# Patient Record
Sex: Female | Born: 1935
Health system: Southern US, Community
[De-identification: ages and names within clinical notes are randomized; demographics above are authoritative.]

## PROBLEM LIST (undated history)

## (undated) DIAGNOSIS — F32A Depression, unspecified: Secondary | ICD-10-CM

## (undated) DIAGNOSIS — R1310 Dysphagia, unspecified: Secondary | ICD-10-CM

## (undated) DIAGNOSIS — F329 Major depressive disorder, single episode, unspecified: Secondary | ICD-10-CM

## (undated) DIAGNOSIS — M419 Scoliosis, unspecified: Secondary | ICD-10-CM

## (undated) DIAGNOSIS — F0390 Unspecified dementia without behavioral disturbance: Secondary | ICD-10-CM

## (undated) DIAGNOSIS — I10 Essential (primary) hypertension: Secondary | ICD-10-CM

## (undated) DIAGNOSIS — J449 Chronic obstructive pulmonary disease, unspecified: Secondary | ICD-10-CM

## (undated) HISTORY — PX: ABDOMINAL HYSTERECTOMY: SHX81

## (undated) HISTORY — PX: VITRECTOMY AND CATARACT: SHX6184

## (undated) HISTORY — PX: SHOULDER SURGERY: SHX246

## (undated) HISTORY — PX: KNEE SURGERY: SHX244

---

## 2014-02-10 DEATH — deceased

## 2015-08-16 DIAGNOSIS — F339 Major depressive disorder, recurrent, unspecified: Secondary | ICD-10-CM | POA: Diagnosis not present

## 2015-08-19 DIAGNOSIS — F339 Major depressive disorder, recurrent, unspecified: Secondary | ICD-10-CM | POA: Diagnosis not present

## 2015-08-23 DIAGNOSIS — F339 Major depressive disorder, recurrent, unspecified: Secondary | ICD-10-CM | POA: Diagnosis not present

## 2015-08-27 DIAGNOSIS — F339 Major depressive disorder, recurrent, unspecified: Secondary | ICD-10-CM | POA: Diagnosis not present

## 2015-08-30 DIAGNOSIS — F0281 Dementia in other diseases classified elsewhere with behavioral disturbance: Secondary | ICD-10-CM | POA: Diagnosis not present

## 2015-08-30 DIAGNOSIS — N3090 Cystitis, unspecified without hematuria: Secondary | ICD-10-CM | POA: Diagnosis not present

## 2015-08-30 DIAGNOSIS — Z139 Encounter for screening, unspecified: Secondary | ICD-10-CM | POA: Diagnosis not present

## 2015-08-30 DIAGNOSIS — G301 Alzheimer's disease with late onset: Secondary | ICD-10-CM | POA: Diagnosis not present

## 2015-08-30 DIAGNOSIS — M150 Primary generalized (osteo)arthritis: Secondary | ICD-10-CM | POA: Diagnosis not present

## 2015-08-30 DIAGNOSIS — M810 Age-related osteoporosis without current pathological fracture: Secondary | ICD-10-CM | POA: Diagnosis not present

## 2015-08-30 DIAGNOSIS — I10 Essential (primary) hypertension: Secondary | ICD-10-CM | POA: Diagnosis not present

## 2015-08-31 DIAGNOSIS — F339 Major depressive disorder, recurrent, unspecified: Secondary | ICD-10-CM | POA: Diagnosis not present

## 2015-09-02 DIAGNOSIS — F339 Major depressive disorder, recurrent, unspecified: Secondary | ICD-10-CM | POA: Diagnosis not present

## 2015-09-07 DIAGNOSIS — F339 Major depressive disorder, recurrent, unspecified: Secondary | ICD-10-CM | POA: Diagnosis not present

## 2015-09-09 DIAGNOSIS — F339 Major depressive disorder, recurrent, unspecified: Secondary | ICD-10-CM | POA: Diagnosis not present

## 2015-09-14 DIAGNOSIS — F339 Major depressive disorder, recurrent, unspecified: Secondary | ICD-10-CM | POA: Diagnosis not present

## 2015-09-16 DIAGNOSIS — F339 Major depressive disorder, recurrent, unspecified: Secondary | ICD-10-CM | POA: Diagnosis not present

## 2015-09-23 DIAGNOSIS — F339 Major depressive disorder, recurrent, unspecified: Secondary | ICD-10-CM | POA: Diagnosis not present

## 2015-09-25 DIAGNOSIS — F339 Major depressive disorder, recurrent, unspecified: Secondary | ICD-10-CM | POA: Diagnosis not present

## 2015-09-27 DIAGNOSIS — F339 Major depressive disorder, recurrent, unspecified: Secondary | ICD-10-CM | POA: Diagnosis not present

## 2015-10-02 DIAGNOSIS — F339 Major depressive disorder, recurrent, unspecified: Secondary | ICD-10-CM | POA: Diagnosis not present

## 2015-10-05 DIAGNOSIS — F339 Major depressive disorder, recurrent, unspecified: Secondary | ICD-10-CM | POA: Diagnosis not present

## 2015-10-07 DIAGNOSIS — F339 Major depressive disorder, recurrent, unspecified: Secondary | ICD-10-CM | POA: Diagnosis not present

## 2015-10-14 DIAGNOSIS — F339 Major depressive disorder, recurrent, unspecified: Secondary | ICD-10-CM | POA: Diagnosis not present

## 2015-10-18 LAB — HEPATIC FUNCTION PANEL
ALT: 10 U/L (ref 7–35)
AST: 21 U/L (ref 13–35)
Alkaline Phosphatase: 67 U/L (ref 25–125)
Bilirubin, Total: 0.3 mg/dL

## 2015-10-18 LAB — BASIC METABOLIC PANEL
BUN: 22 mg/dL — AB (ref 4–21)
Creatinine: 1 mg/dL (ref 0.5–1.1)
Glucose: 126 mg/dL
Potassium: 4 mmol/L (ref 3.4–5.3)
Sodium: 145 mmol/L (ref 137–147)

## 2015-10-18 LAB — LIPID PANEL
Cholesterol: 193 mg/dL (ref 0–200)
HDL: 62 mg/dL (ref 35–70)
LDL Cholesterol: 116 mg/dL
Triglycerides: 75 mg/dL (ref 40–160)

## 2015-10-19 DIAGNOSIS — F339 Major depressive disorder, recurrent, unspecified: Secondary | ICD-10-CM | POA: Diagnosis not present

## 2015-10-21 DIAGNOSIS — F339 Major depressive disorder, recurrent, unspecified: Secondary | ICD-10-CM | POA: Diagnosis not present

## 2015-11-02 DIAGNOSIS — R079 Chest pain, unspecified: Secondary | ICD-10-CM | POA: Diagnosis not present

## 2015-11-02 DIAGNOSIS — R0602 Shortness of breath: Secondary | ICD-10-CM | POA: Diagnosis not present

## 2015-11-02 DIAGNOSIS — Z139 Encounter for screening, unspecified: Secondary | ICD-10-CM | POA: Diagnosis not present

## 2015-11-02 DIAGNOSIS — J449 Chronic obstructive pulmonary disease, unspecified: Secondary | ICD-10-CM | POA: Diagnosis not present

## 2015-11-02 DIAGNOSIS — F339 Major depressive disorder, recurrent, unspecified: Secondary | ICD-10-CM | POA: Diagnosis not present

## 2015-11-02 DIAGNOSIS — M1990 Unspecified osteoarthritis, unspecified site: Secondary | ICD-10-CM | POA: Diagnosis not present

## 2015-11-04 DIAGNOSIS — F339 Major depressive disorder, recurrent, unspecified: Secondary | ICD-10-CM | POA: Diagnosis not present

## 2015-11-09 DIAGNOSIS — F339 Major depressive disorder, recurrent, unspecified: Secondary | ICD-10-CM | POA: Diagnosis not present

## 2015-11-10 DIAGNOSIS — L603 Nail dystrophy: Secondary | ICD-10-CM | POA: Diagnosis not present

## 2015-11-10 DIAGNOSIS — M2041 Other hammer toe(s) (acquired), right foot: Secondary | ICD-10-CM | POA: Diagnosis not present

## 2015-11-10 DIAGNOSIS — Z139 Encounter for screening, unspecified: Secondary | ICD-10-CM | POA: Diagnosis not present

## 2015-11-10 DIAGNOSIS — M2042 Other hammer toe(s) (acquired), left foot: Secondary | ICD-10-CM | POA: Diagnosis not present

## 2015-11-10 DIAGNOSIS — M19079 Primary osteoarthritis, unspecified ankle and foot: Secondary | ICD-10-CM | POA: Diagnosis not present

## 2015-11-11 DIAGNOSIS — F339 Major depressive disorder, recurrent, unspecified: Secondary | ICD-10-CM | POA: Diagnosis not present

## 2015-11-16 DIAGNOSIS — F339 Major depressive disorder, recurrent, unspecified: Secondary | ICD-10-CM | POA: Diagnosis not present

## 2015-11-20 DIAGNOSIS — F339 Major depressive disorder, recurrent, unspecified: Secondary | ICD-10-CM | POA: Diagnosis not present

## 2015-11-23 DIAGNOSIS — F339 Major depressive disorder, recurrent, unspecified: Secondary | ICD-10-CM | POA: Diagnosis not present

## 2015-11-25 DIAGNOSIS — F0390 Unspecified dementia without behavioral disturbance: Secondary | ICD-10-CM | POA: Diagnosis not present

## 2015-11-25 DIAGNOSIS — M1990 Unspecified osteoarthritis, unspecified site: Secondary | ICD-10-CM | POA: Diagnosis not present

## 2015-11-25 DIAGNOSIS — Z96653 Presence of artificial knee joint, bilateral: Secondary | ICD-10-CM | POA: Diagnosis not present

## 2015-11-25 DIAGNOSIS — N183 Chronic kidney disease, stage 3 (moderate): Secondary | ICD-10-CM | POA: Diagnosis not present

## 2015-11-25 DIAGNOSIS — J439 Emphysema, unspecified: Secondary | ICD-10-CM | POA: Diagnosis not present

## 2015-11-25 DIAGNOSIS — F339 Major depressive disorder, recurrent, unspecified: Secondary | ICD-10-CM | POA: Diagnosis not present

## 2015-11-25 DIAGNOSIS — J984 Other disorders of lung: Secondary | ICD-10-CM | POA: Diagnosis not present

## 2015-11-25 DIAGNOSIS — I129 Hypertensive chronic kidney disease with stage 1 through stage 4 chronic kidney disease, or unspecified chronic kidney disease: Secondary | ICD-10-CM | POA: Diagnosis not present

## 2015-11-25 DIAGNOSIS — G309 Alzheimer's disease, unspecified: Secondary | ICD-10-CM | POA: Diagnosis not present

## 2015-11-25 DIAGNOSIS — R4182 Altered mental status, unspecified: Secondary | ICD-10-CM | POA: Diagnosis not present

## 2015-11-25 DIAGNOSIS — F0281 Dementia in other diseases classified elsewhere with behavioral disturbance: Secondary | ICD-10-CM | POA: Diagnosis not present

## 2015-11-26 DIAGNOSIS — R079 Chest pain, unspecified: Secondary | ICD-10-CM | POA: Diagnosis not present

## 2015-11-26 DIAGNOSIS — F0150 Vascular dementia without behavioral disturbance: Secondary | ICD-10-CM | POA: Diagnosis not present

## 2015-11-26 DIAGNOSIS — J984 Other disorders of lung: Secondary | ICD-10-CM | POA: Diagnosis not present

## 2015-11-26 DIAGNOSIS — Z96653 Presence of artificial knee joint, bilateral: Secondary | ICD-10-CM | POA: Diagnosis present

## 2015-11-26 DIAGNOSIS — R262 Difficulty in walking, not elsewhere classified: Secondary | ICD-10-CM | POA: Diagnosis not present

## 2015-11-26 DIAGNOSIS — Z96619 Presence of unspecified artificial shoulder joint: Secondary | ICD-10-CM | POA: Diagnosis present

## 2015-11-26 DIAGNOSIS — N179 Acute kidney failure, unspecified: Secondary | ICD-10-CM | POA: Diagnosis not present

## 2015-11-26 DIAGNOSIS — D649 Anemia, unspecified: Secondary | ICD-10-CM | POA: Diagnosis not present

## 2015-11-26 DIAGNOSIS — G309 Alzheimer's disease, unspecified: Secondary | ICD-10-CM | POA: Diagnosis present

## 2015-11-26 DIAGNOSIS — N183 Chronic kidney disease, stage 3 (moderate): Secondary | ICD-10-CM | POA: Diagnosis present

## 2015-11-26 DIAGNOSIS — G308 Other Alzheimer's disease: Secondary | ICD-10-CM | POA: Diagnosis not present

## 2015-11-26 DIAGNOSIS — M1990 Unspecified osteoarthritis, unspecified site: Secondary | ICD-10-CM | POA: Diagnosis not present

## 2015-11-26 DIAGNOSIS — R2689 Other abnormalities of gait and mobility: Secondary | ICD-10-CM | POA: Diagnosis present

## 2015-11-26 DIAGNOSIS — I10 Essential (primary) hypertension: Secondary | ICD-10-CM | POA: Diagnosis not present

## 2015-11-26 DIAGNOSIS — I129 Hypertensive chronic kidney disease with stage 1 through stage 4 chronic kidney disease, or unspecified chronic kidney disease: Secondary | ICD-10-CM | POA: Diagnosis present

## 2015-11-26 DIAGNOSIS — J439 Emphysema, unspecified: Secondary | ICD-10-CM | POA: Diagnosis not present

## 2015-11-26 DIAGNOSIS — R4182 Altered mental status, unspecified: Secondary | ICD-10-CM | POA: Diagnosis not present

## 2015-11-26 DIAGNOSIS — F0281 Dementia in other diseases classified elsewhere with behavioral disturbance: Secondary | ICD-10-CM | POA: Diagnosis present

## 2015-11-26 DIAGNOSIS — M6281 Muscle weakness (generalized): Secondary | ICD-10-CM | POA: Diagnosis not present

## 2015-11-26 DIAGNOSIS — F0391 Unspecified dementia with behavioral disturbance: Secondary | ICD-10-CM | POA: Diagnosis not present

## 2015-12-02 DIAGNOSIS — F339 Major depressive disorder, recurrent, unspecified: Secondary | ICD-10-CM | POA: Diagnosis not present

## 2015-12-04 DIAGNOSIS — F339 Major depressive disorder, recurrent, unspecified: Secondary | ICD-10-CM | POA: Diagnosis not present

## 2015-12-07 DIAGNOSIS — R410 Disorientation, unspecified: Secondary | ICD-10-CM | POA: Diagnosis not present

## 2015-12-07 DIAGNOSIS — G308 Other Alzheimer's disease: Secondary | ICD-10-CM | POA: Diagnosis not present

## 2015-12-07 DIAGNOSIS — I129 Hypertensive chronic kidney disease with stage 1 through stage 4 chronic kidney disease, or unspecified chronic kidney disease: Secondary | ICD-10-CM | POA: Diagnosis present

## 2015-12-07 DIAGNOSIS — R1310 Dysphagia, unspecified: Secondary | ICD-10-CM | POA: Diagnosis not present

## 2015-12-07 DIAGNOSIS — G3183 Dementia with Lewy bodies: Secondary | ICD-10-CM | POA: Diagnosis not present

## 2015-12-07 DIAGNOSIS — F0281 Dementia in other diseases classified elsewhere with behavioral disturbance: Secondary | ICD-10-CM | POA: Diagnosis present

## 2015-12-07 DIAGNOSIS — F338 Other recurrent depressive disorders: Secondary | ICD-10-CM | POA: Diagnosis not present

## 2015-12-07 DIAGNOSIS — F0280 Dementia in other diseases classified elsewhere without behavioral disturbance: Secondary | ICD-10-CM | POA: Diagnosis not present

## 2015-12-07 DIAGNOSIS — N189 Chronic kidney disease, unspecified: Secondary | ICD-10-CM | POA: Diagnosis present

## 2015-12-07 DIAGNOSIS — R4182 Altered mental status, unspecified: Secondary | ICD-10-CM | POA: Diagnosis not present

## 2015-12-07 DIAGNOSIS — F0390 Unspecified dementia without behavioral disturbance: Secondary | ICD-10-CM | POA: Diagnosis not present

## 2015-12-07 DIAGNOSIS — F919 Conduct disorder, unspecified: Secondary | ICD-10-CM | POA: Diagnosis not present

## 2015-12-07 DIAGNOSIS — W19XXXA Unspecified fall, initial encounter: Secondary | ICD-10-CM | POA: Diagnosis not present

## 2015-12-07 DIAGNOSIS — R531 Weakness: Secondary | ICD-10-CM | POA: Diagnosis not present

## 2015-12-07 DIAGNOSIS — R269 Unspecified abnormalities of gait and mobility: Secondary | ICD-10-CM | POA: Diagnosis not present

## 2015-12-07 DIAGNOSIS — M6281 Muscle weakness (generalized): Secondary | ICD-10-CM | POA: Diagnosis not present

## 2015-12-07 DIAGNOSIS — R451 Restlessness and agitation: Secondary | ICD-10-CM | POA: Diagnosis not present

## 2015-12-07 DIAGNOSIS — R443 Hallucinations, unspecified: Secondary | ICD-10-CM | POA: Diagnosis not present

## 2015-12-07 DIAGNOSIS — I10 Essential (primary) hypertension: Secondary | ICD-10-CM | POA: Diagnosis not present

## 2015-12-07 DIAGNOSIS — R293 Abnormal posture: Secondary | ICD-10-CM | POA: Diagnosis not present

## 2015-12-07 DIAGNOSIS — Z96619 Presence of unspecified artificial shoulder joint: Secondary | ICD-10-CM | POA: Diagnosis present

## 2015-12-07 DIAGNOSIS — F0391 Unspecified dementia with behavioral disturbance: Secondary | ICD-10-CM | POA: Diagnosis not present

## 2015-12-07 DIAGNOSIS — Z96653 Presence of artificial knee joint, bilateral: Secondary | ICD-10-CM | POA: Diagnosis present

## 2015-12-07 DIAGNOSIS — M1990 Unspecified osteoarthritis, unspecified site: Secondary | ICD-10-CM | POA: Diagnosis not present

## 2015-12-07 DIAGNOSIS — J984 Other disorders of lung: Secondary | ICD-10-CM | POA: Diagnosis not present

## 2015-12-07 DIAGNOSIS — N281 Cyst of kidney, acquired: Secondary | ICD-10-CM | POA: Diagnosis not present

## 2015-12-07 DIAGNOSIS — J449 Chronic obstructive pulmonary disease, unspecified: Secondary | ICD-10-CM | POA: Diagnosis not present

## 2015-12-07 DIAGNOSIS — J929 Pleural plaque without asbestos: Secondary | ICD-10-CM | POA: Diagnosis not present

## 2015-12-07 DIAGNOSIS — N179 Acute kidney failure, unspecified: Secondary | ICD-10-CM | POA: Diagnosis not present

## 2015-12-07 DIAGNOSIS — G309 Alzheimer's disease, unspecified: Secondary | ICD-10-CM | POA: Diagnosis not present

## 2015-12-10 DIAGNOSIS — R531 Weakness: Secondary | ICD-10-CM | POA: Diagnosis not present

## 2015-12-10 DIAGNOSIS — R293 Abnormal posture: Secondary | ICD-10-CM | POA: Diagnosis not present

## 2015-12-10 DIAGNOSIS — J449 Chronic obstructive pulmonary disease, unspecified: Secondary | ICD-10-CM | POA: Diagnosis not present

## 2015-12-10 DIAGNOSIS — R269 Unspecified abnormalities of gait and mobility: Secondary | ICD-10-CM | POA: Diagnosis not present

## 2015-12-10 DIAGNOSIS — R42 Dizziness and giddiness: Secondary | ICD-10-CM | POA: Diagnosis not present

## 2015-12-10 DIAGNOSIS — D539 Nutritional anemia, unspecified: Secondary | ICD-10-CM | POA: Diagnosis not present

## 2015-12-10 DIAGNOSIS — M6281 Muscle weakness (generalized): Secondary | ICD-10-CM | POA: Diagnosis not present

## 2015-12-10 DIAGNOSIS — G3183 Dementia with Lewy bodies: Secondary | ICD-10-CM | POA: Diagnosis not present

## 2015-12-10 DIAGNOSIS — G309 Alzheimer's disease, unspecified: Secondary | ICD-10-CM | POA: Diagnosis not present

## 2015-12-10 DIAGNOSIS — F0391 Unspecified dementia with behavioral disturbance: Secondary | ICD-10-CM | POA: Diagnosis not present

## 2015-12-10 DIAGNOSIS — H43393 Other vitreous opacities, bilateral: Secondary | ICD-10-CM | POA: Diagnosis not present

## 2015-12-10 DIAGNOSIS — I70203 Unspecified atherosclerosis of native arteries of extremities, bilateral legs: Secondary | ICD-10-CM | POA: Diagnosis not present

## 2015-12-10 DIAGNOSIS — H35013 Changes in retinal vascular appearance, bilateral: Secondary | ICD-10-CM | POA: Diagnosis not present

## 2015-12-10 DIAGNOSIS — Z2821 Immunization not carried out because of patient refusal: Secondary | ICD-10-CM | POA: Diagnosis not present

## 2015-12-10 DIAGNOSIS — R4182 Altered mental status, unspecified: Secondary | ICD-10-CM | POA: Diagnosis not present

## 2015-12-10 DIAGNOSIS — B351 Tinea unguium: Secondary | ICD-10-CM | POA: Diagnosis not present

## 2015-12-10 DIAGNOSIS — R1310 Dysphagia, unspecified: Secondary | ICD-10-CM | POA: Diagnosis not present

## 2015-12-10 DIAGNOSIS — F0390 Unspecified dementia without behavioral disturbance: Secondary | ICD-10-CM | POA: Diagnosis not present

## 2015-12-10 DIAGNOSIS — Z961 Presence of intraocular lens: Secondary | ICD-10-CM | POA: Diagnosis not present

## 2015-12-10 DIAGNOSIS — N3942 Incontinence without sensory awareness: Secondary | ICD-10-CM | POA: Diagnosis not present

## 2015-12-10 DIAGNOSIS — I10 Essential (primary) hypertension: Secondary | ICD-10-CM | POA: Diagnosis not present

## 2015-12-10 DIAGNOSIS — N179 Acute kidney failure, unspecified: Secondary | ICD-10-CM | POA: Diagnosis not present

## 2015-12-10 DIAGNOSIS — M79672 Pain in left foot: Secondary | ICD-10-CM | POA: Diagnosis not present

## 2015-12-10 DIAGNOSIS — F329 Major depressive disorder, single episode, unspecified: Secondary | ICD-10-CM | POA: Diagnosis not present

## 2015-12-10 DIAGNOSIS — R5381 Other malaise: Secondary | ICD-10-CM | POA: Diagnosis not present

## 2015-12-10 DIAGNOSIS — F339 Major depressive disorder, recurrent, unspecified: Secondary | ICD-10-CM | POA: Diagnosis not present

## 2015-12-10 DIAGNOSIS — M1990 Unspecified osteoarthritis, unspecified site: Secondary | ICD-10-CM | POA: Diagnosis not present

## 2015-12-10 DIAGNOSIS — R2689 Other abnormalities of gait and mobility: Secondary | ICD-10-CM | POA: Diagnosis not present

## 2015-12-10 DIAGNOSIS — F338 Other recurrent depressive disorders: Secondary | ICD-10-CM | POA: Diagnosis not present

## 2015-12-13 DIAGNOSIS — R42 Dizziness and giddiness: Secondary | ICD-10-CM | POA: Diagnosis not present

## 2015-12-13 DIAGNOSIS — I10 Essential (primary) hypertension: Secondary | ICD-10-CM | POA: Diagnosis not present

## 2015-12-13 DIAGNOSIS — R2689 Other abnormalities of gait and mobility: Secondary | ICD-10-CM | POA: Diagnosis not present

## 2015-12-13 DIAGNOSIS — J449 Chronic obstructive pulmonary disease, unspecified: Secondary | ICD-10-CM | POA: Diagnosis not present

## 2015-12-13 DIAGNOSIS — N3942 Incontinence without sensory awareness: Secondary | ICD-10-CM | POA: Diagnosis not present

## 2015-12-13 DIAGNOSIS — G309 Alzheimer's disease, unspecified: Secondary | ICD-10-CM | POA: Diagnosis not present

## 2015-12-13 DIAGNOSIS — R5381 Other malaise: Secondary | ICD-10-CM | POA: Diagnosis not present

## 2015-12-14 DIAGNOSIS — I70203 Unspecified atherosclerosis of native arteries of extremities, bilateral legs: Secondary | ICD-10-CM | POA: Diagnosis not present

## 2015-12-14 DIAGNOSIS — G309 Alzheimer's disease, unspecified: Secondary | ICD-10-CM | POA: Diagnosis not present

## 2015-12-14 DIAGNOSIS — N3942 Incontinence without sensory awareness: Secondary | ICD-10-CM | POA: Diagnosis not present

## 2015-12-14 DIAGNOSIS — B351 Tinea unguium: Secondary | ICD-10-CM | POA: Diagnosis not present

## 2015-12-14 DIAGNOSIS — I10 Essential (primary) hypertension: Secondary | ICD-10-CM | POA: Diagnosis not present

## 2015-12-14 DIAGNOSIS — R42 Dizziness and giddiness: Secondary | ICD-10-CM | POA: Diagnosis not present

## 2015-12-14 DIAGNOSIS — M79672 Pain in left foot: Secondary | ICD-10-CM | POA: Diagnosis not present

## 2015-12-14 DIAGNOSIS — D539 Nutritional anemia, unspecified: Secondary | ICD-10-CM | POA: Diagnosis not present

## 2015-12-14 DIAGNOSIS — J449 Chronic obstructive pulmonary disease, unspecified: Secondary | ICD-10-CM | POA: Diagnosis not present

## 2015-12-16 DIAGNOSIS — J449 Chronic obstructive pulmonary disease, unspecified: Secondary | ICD-10-CM | POA: Diagnosis not present

## 2015-12-16 DIAGNOSIS — F0391 Unspecified dementia with behavioral disturbance: Secondary | ICD-10-CM | POA: Diagnosis not present

## 2015-12-16 DIAGNOSIS — F329 Major depressive disorder, single episode, unspecified: Secondary | ICD-10-CM | POA: Diagnosis not present

## 2015-12-16 DIAGNOSIS — I10 Essential (primary) hypertension: Secondary | ICD-10-CM | POA: Diagnosis not present

## 2015-12-29 DIAGNOSIS — Z961 Presence of intraocular lens: Secondary | ICD-10-CM | POA: Diagnosis not present

## 2015-12-29 DIAGNOSIS — H43393 Other vitreous opacities, bilateral: Secondary | ICD-10-CM | POA: Diagnosis not present

## 2015-12-29 DIAGNOSIS — H35013 Changes in retinal vascular appearance, bilateral: Secondary | ICD-10-CM | POA: Diagnosis not present

## 2016-01-02 DIAGNOSIS — F0390 Unspecified dementia without behavioral disturbance: Secondary | ICD-10-CM | POA: Diagnosis not present

## 2016-01-02 DIAGNOSIS — F329 Major depressive disorder, single episode, unspecified: Secondary | ICD-10-CM | POA: Diagnosis not present

## 2016-01-02 DIAGNOSIS — J449 Chronic obstructive pulmonary disease, unspecified: Secondary | ICD-10-CM | POA: Diagnosis not present

## 2016-01-02 DIAGNOSIS — I10 Essential (primary) hypertension: Secondary | ICD-10-CM | POA: Diagnosis not present

## 2016-01-05 DIAGNOSIS — J449 Chronic obstructive pulmonary disease, unspecified: Secondary | ICD-10-CM | POA: Diagnosis not present

## 2016-01-05 DIAGNOSIS — I10 Essential (primary) hypertension: Secondary | ICD-10-CM | POA: Diagnosis not present

## 2016-01-05 DIAGNOSIS — F0390 Unspecified dementia without behavioral disturbance: Secondary | ICD-10-CM | POA: Diagnosis not present

## 2016-01-05 DIAGNOSIS — F329 Major depressive disorder, single episode, unspecified: Secondary | ICD-10-CM | POA: Diagnosis not present

## 2016-01-07 DIAGNOSIS — F0281 Dementia in other diseases classified elsewhere with behavioral disturbance: Secondary | ICD-10-CM | POA: Diagnosis not present

## 2016-01-07 DIAGNOSIS — I10 Essential (primary) hypertension: Secondary | ICD-10-CM | POA: Diagnosis not present

## 2016-01-07 DIAGNOSIS — Z9183 Wandering in diseases classified elsewhere: Secondary | ICD-10-CM | POA: Diagnosis not present

## 2016-01-07 DIAGNOSIS — R2689 Other abnormalities of gait and mobility: Secondary | ICD-10-CM | POA: Diagnosis not present

## 2016-01-07 DIAGNOSIS — F338 Other recurrent depressive disorders: Secondary | ICD-10-CM | POA: Diagnosis not present

## 2016-01-07 DIAGNOSIS — G309 Alzheimer's disease, unspecified: Secondary | ICD-10-CM | POA: Diagnosis not present

## 2016-01-07 DIAGNOSIS — J449 Chronic obstructive pulmonary disease, unspecified: Secondary | ICD-10-CM | POA: Diagnosis not present

## 2016-01-11 DIAGNOSIS — F338 Other recurrent depressive disorders: Secondary | ICD-10-CM | POA: Diagnosis not present

## 2016-01-11 DIAGNOSIS — I10 Essential (primary) hypertension: Secondary | ICD-10-CM | POA: Diagnosis not present

## 2016-01-11 DIAGNOSIS — F0281 Dementia in other diseases classified elsewhere with behavioral disturbance: Secondary | ICD-10-CM | POA: Diagnosis not present

## 2016-01-11 DIAGNOSIS — G309 Alzheimer's disease, unspecified: Secondary | ICD-10-CM | POA: Diagnosis not present

## 2016-01-11 DIAGNOSIS — R2689 Other abnormalities of gait and mobility: Secondary | ICD-10-CM | POA: Diagnosis not present

## 2016-01-11 DIAGNOSIS — J449 Chronic obstructive pulmonary disease, unspecified: Secondary | ICD-10-CM | POA: Diagnosis not present

## 2016-01-12 DIAGNOSIS — G309 Alzheimer's disease, unspecified: Secondary | ICD-10-CM | POA: Diagnosis not present

## 2016-01-12 DIAGNOSIS — F338 Other recurrent depressive disorders: Secondary | ICD-10-CM | POA: Diagnosis not present

## 2016-01-12 DIAGNOSIS — J449 Chronic obstructive pulmonary disease, unspecified: Secondary | ICD-10-CM | POA: Diagnosis not present

## 2016-01-12 DIAGNOSIS — R2689 Other abnormalities of gait and mobility: Secondary | ICD-10-CM | POA: Diagnosis not present

## 2016-01-12 DIAGNOSIS — F0281 Dementia in other diseases classified elsewhere with behavioral disturbance: Secondary | ICD-10-CM | POA: Diagnosis not present

## 2016-01-12 DIAGNOSIS — I10 Essential (primary) hypertension: Secondary | ICD-10-CM | POA: Diagnosis not present

## 2016-01-13 DIAGNOSIS — F338 Other recurrent depressive disorders: Secondary | ICD-10-CM | POA: Diagnosis not present

## 2016-01-13 DIAGNOSIS — F0281 Dementia in other diseases classified elsewhere with behavioral disturbance: Secondary | ICD-10-CM | POA: Diagnosis not present

## 2016-01-13 DIAGNOSIS — G309 Alzheimer's disease, unspecified: Secondary | ICD-10-CM | POA: Diagnosis not present

## 2016-01-13 DIAGNOSIS — J449 Chronic obstructive pulmonary disease, unspecified: Secondary | ICD-10-CM | POA: Diagnosis not present

## 2016-01-13 DIAGNOSIS — I10 Essential (primary) hypertension: Secondary | ICD-10-CM | POA: Diagnosis not present

## 2016-01-13 DIAGNOSIS — R2689 Other abnormalities of gait and mobility: Secondary | ICD-10-CM | POA: Diagnosis not present

## 2016-01-17 DIAGNOSIS — I10 Essential (primary) hypertension: Secondary | ICD-10-CM | POA: Diagnosis not present

## 2016-01-17 DIAGNOSIS — G309 Alzheimer's disease, unspecified: Secondary | ICD-10-CM | POA: Diagnosis not present

## 2016-01-17 DIAGNOSIS — J449 Chronic obstructive pulmonary disease, unspecified: Secondary | ICD-10-CM | POA: Diagnosis not present

## 2016-01-17 DIAGNOSIS — R2689 Other abnormalities of gait and mobility: Secondary | ICD-10-CM | POA: Diagnosis not present

## 2016-01-17 DIAGNOSIS — F0281 Dementia in other diseases classified elsewhere with behavioral disturbance: Secondary | ICD-10-CM | POA: Diagnosis not present

## 2016-01-17 DIAGNOSIS — F338 Other recurrent depressive disorders: Secondary | ICD-10-CM | POA: Diagnosis not present

## 2016-01-18 DIAGNOSIS — F0281 Dementia in other diseases classified elsewhere with behavioral disturbance: Secondary | ICD-10-CM | POA: Diagnosis not present

## 2016-01-18 DIAGNOSIS — I10 Essential (primary) hypertension: Secondary | ICD-10-CM | POA: Diagnosis not present

## 2016-01-18 DIAGNOSIS — G309 Alzheimer's disease, unspecified: Secondary | ICD-10-CM | POA: Diagnosis not present

## 2016-01-18 DIAGNOSIS — F338 Other recurrent depressive disorders: Secondary | ICD-10-CM | POA: Diagnosis not present

## 2016-01-18 DIAGNOSIS — J449 Chronic obstructive pulmonary disease, unspecified: Secondary | ICD-10-CM | POA: Diagnosis not present

## 2016-01-18 DIAGNOSIS — R2689 Other abnormalities of gait and mobility: Secondary | ICD-10-CM | POA: Diagnosis not present

## 2016-01-19 DIAGNOSIS — I10 Essential (primary) hypertension: Secondary | ICD-10-CM | POA: Diagnosis not present

## 2016-01-19 DIAGNOSIS — J449 Chronic obstructive pulmonary disease, unspecified: Secondary | ICD-10-CM | POA: Diagnosis not present

## 2016-01-19 DIAGNOSIS — F338 Other recurrent depressive disorders: Secondary | ICD-10-CM | POA: Diagnosis not present

## 2016-01-19 DIAGNOSIS — G309 Alzheimer's disease, unspecified: Secondary | ICD-10-CM | POA: Diagnosis not present

## 2016-01-19 DIAGNOSIS — F0281 Dementia in other diseases classified elsewhere with behavioral disturbance: Secondary | ICD-10-CM | POA: Diagnosis not present

## 2016-01-19 DIAGNOSIS — R2689 Other abnormalities of gait and mobility: Secondary | ICD-10-CM | POA: Diagnosis not present

## 2016-01-20 DIAGNOSIS — J449 Chronic obstructive pulmonary disease, unspecified: Secondary | ICD-10-CM | POA: Diagnosis not present

## 2016-01-20 DIAGNOSIS — F0281 Dementia in other diseases classified elsewhere with behavioral disturbance: Secondary | ICD-10-CM | POA: Diagnosis not present

## 2016-01-20 DIAGNOSIS — Z09 Encounter for follow-up examination after completed treatment for conditions other than malignant neoplasm: Secondary | ICD-10-CM | POA: Diagnosis not present

## 2016-01-20 DIAGNOSIS — Z139 Encounter for screening, unspecified: Secondary | ICD-10-CM | POA: Diagnosis not present

## 2016-01-20 DIAGNOSIS — I10 Essential (primary) hypertension: Secondary | ICD-10-CM | POA: Diagnosis not present

## 2016-01-20 DIAGNOSIS — G301 Alzheimer's disease with late onset: Secondary | ICD-10-CM | POA: Diagnosis not present

## 2016-01-21 DIAGNOSIS — J449 Chronic obstructive pulmonary disease, unspecified: Secondary | ICD-10-CM | POA: Diagnosis not present

## 2016-01-21 DIAGNOSIS — R2689 Other abnormalities of gait and mobility: Secondary | ICD-10-CM | POA: Diagnosis not present

## 2016-01-21 DIAGNOSIS — I10 Essential (primary) hypertension: Secondary | ICD-10-CM | POA: Diagnosis not present

## 2016-01-21 DIAGNOSIS — F0281 Dementia in other diseases classified elsewhere with behavioral disturbance: Secondary | ICD-10-CM | POA: Diagnosis not present

## 2016-01-21 DIAGNOSIS — G309 Alzheimer's disease, unspecified: Secondary | ICD-10-CM | POA: Diagnosis not present

## 2016-01-21 DIAGNOSIS — F338 Other recurrent depressive disorders: Secondary | ICD-10-CM | POA: Diagnosis not present

## 2016-01-24 DIAGNOSIS — G309 Alzheimer's disease, unspecified: Secondary | ICD-10-CM | POA: Diagnosis not present

## 2016-01-24 DIAGNOSIS — I10 Essential (primary) hypertension: Secondary | ICD-10-CM | POA: Diagnosis not present

## 2016-01-24 DIAGNOSIS — F0281 Dementia in other diseases classified elsewhere with behavioral disturbance: Secondary | ICD-10-CM | POA: Diagnosis not present

## 2016-01-24 DIAGNOSIS — R2689 Other abnormalities of gait and mobility: Secondary | ICD-10-CM | POA: Diagnosis not present

## 2016-01-24 DIAGNOSIS — J449 Chronic obstructive pulmonary disease, unspecified: Secondary | ICD-10-CM | POA: Diagnosis not present

## 2016-01-24 DIAGNOSIS — F338 Other recurrent depressive disorders: Secondary | ICD-10-CM | POA: Diagnosis not present

## 2016-01-26 DIAGNOSIS — J449 Chronic obstructive pulmonary disease, unspecified: Secondary | ICD-10-CM | POA: Diagnosis not present

## 2016-01-26 DIAGNOSIS — I10 Essential (primary) hypertension: Secondary | ICD-10-CM | POA: Diagnosis not present

## 2016-01-26 DIAGNOSIS — F338 Other recurrent depressive disorders: Secondary | ICD-10-CM | POA: Diagnosis not present

## 2016-01-26 DIAGNOSIS — F0281 Dementia in other diseases classified elsewhere with behavioral disturbance: Secondary | ICD-10-CM | POA: Diagnosis not present

## 2016-01-26 DIAGNOSIS — G309 Alzheimer's disease, unspecified: Secondary | ICD-10-CM | POA: Diagnosis not present

## 2016-01-26 DIAGNOSIS — R2689 Other abnormalities of gait and mobility: Secondary | ICD-10-CM | POA: Diagnosis not present

## 2016-01-30 DIAGNOSIS — E860 Dehydration: Secondary | ICD-10-CM | POA: Diagnosis not present

## 2016-01-30 DIAGNOSIS — R451 Restlessness and agitation: Secondary | ICD-10-CM | POA: Diagnosis not present

## 2016-01-30 DIAGNOSIS — G309 Alzheimer's disease, unspecified: Secondary | ICD-10-CM | POA: Diagnosis not present

## 2016-01-30 DIAGNOSIS — G301 Alzheimer's disease with late onset: Secondary | ICD-10-CM | POA: Diagnosis not present

## 2016-01-30 DIAGNOSIS — F0281 Dementia in other diseases classified elsewhere with behavioral disturbance: Secondary | ICD-10-CM | POA: Diagnosis not present

## 2016-01-30 DIAGNOSIS — J449 Chronic obstructive pulmonary disease, unspecified: Secondary | ICD-10-CM | POA: Diagnosis not present

## 2016-01-30 DIAGNOSIS — Z66 Do not resuscitate: Secondary | ICD-10-CM | POA: Diagnosis not present

## 2016-01-30 DIAGNOSIS — F918 Other conduct disorders: Secondary | ICD-10-CM | POA: Diagnosis not present

## 2016-01-30 DIAGNOSIS — R627 Adult failure to thrive: Secondary | ICD-10-CM | POA: Diagnosis not present

## 2016-01-31 DIAGNOSIS — F338 Other recurrent depressive disorders: Secondary | ICD-10-CM | POA: Diagnosis not present

## 2016-01-31 DIAGNOSIS — G309 Alzheimer's disease, unspecified: Secondary | ICD-10-CM | POA: Diagnosis not present

## 2016-01-31 DIAGNOSIS — I10 Essential (primary) hypertension: Secondary | ICD-10-CM | POA: Diagnosis not present

## 2016-01-31 DIAGNOSIS — F0281 Dementia in other diseases classified elsewhere with behavioral disturbance: Secondary | ICD-10-CM | POA: Diagnosis not present

## 2016-01-31 DIAGNOSIS — J449 Chronic obstructive pulmonary disease, unspecified: Secondary | ICD-10-CM | POA: Diagnosis not present

## 2016-01-31 DIAGNOSIS — R2689 Other abnormalities of gait and mobility: Secondary | ICD-10-CM | POA: Diagnosis not present

## 2016-02-01 DIAGNOSIS — F918 Other conduct disorders: Secondary | ICD-10-CM | POA: Diagnosis not present

## 2016-02-01 DIAGNOSIS — N179 Acute kidney failure, unspecified: Secondary | ICD-10-CM | POA: Diagnosis not present

## 2016-02-01 DIAGNOSIS — R079 Chest pain, unspecified: Secondary | ICD-10-CM | POA: Diagnosis not present

## 2016-02-01 DIAGNOSIS — F0281 Dementia in other diseases classified elsewhere with behavioral disturbance: Secondary | ICD-10-CM | POA: Diagnosis not present

## 2016-02-01 DIAGNOSIS — Z659 Problem related to unspecified psychosocial circumstances: Secondary | ICD-10-CM | POA: Diagnosis not present

## 2016-02-01 DIAGNOSIS — I10 Essential (primary) hypertension: Secondary | ICD-10-CM | POA: Diagnosis not present

## 2016-02-01 DIAGNOSIS — M1990 Unspecified osteoarthritis, unspecified site: Secondary | ICD-10-CM | POA: Diagnosis present

## 2016-02-01 DIAGNOSIS — R627 Adult failure to thrive: Secondary | ICD-10-CM | POA: Diagnosis not present

## 2016-02-01 DIAGNOSIS — M6281 Muscle weakness (generalized): Secondary | ICD-10-CM | POA: Diagnosis not present

## 2016-02-01 DIAGNOSIS — G301 Alzheimer's disease with late onset: Secondary | ICD-10-CM | POA: Diagnosis not present

## 2016-02-01 DIAGNOSIS — J449 Chronic obstructive pulmonary disease, unspecified: Secondary | ICD-10-CM | POA: Diagnosis not present

## 2016-02-01 DIAGNOSIS — M545 Low back pain: Secondary | ICD-10-CM | POA: Diagnosis not present

## 2016-02-01 DIAGNOSIS — N189 Chronic kidney disease, unspecified: Secondary | ICD-10-CM | POA: Diagnosis present

## 2016-02-01 DIAGNOSIS — G309 Alzheimer's disease, unspecified: Secondary | ICD-10-CM | POA: Diagnosis not present

## 2016-02-01 DIAGNOSIS — M810 Age-related osteoporosis without current pathological fracture: Secondary | ICD-10-CM | POA: Diagnosis present

## 2016-02-01 DIAGNOSIS — Z66 Do not resuscitate: Secondary | ICD-10-CM | POA: Diagnosis present

## 2016-02-01 DIAGNOSIS — Z96619 Presence of unspecified artificial shoulder joint: Secondary | ICD-10-CM | POA: Diagnosis present

## 2016-02-01 DIAGNOSIS — E860 Dehydration: Secondary | ICD-10-CM | POA: Diagnosis present

## 2016-02-01 DIAGNOSIS — R451 Restlessness and agitation: Secondary | ICD-10-CM | POA: Diagnosis not present

## 2016-02-01 DIAGNOSIS — F0391 Unspecified dementia with behavioral disturbance: Secondary | ICD-10-CM | POA: Diagnosis not present

## 2016-02-01 DIAGNOSIS — R262 Difficulty in walking, not elsewhere classified: Secondary | ICD-10-CM | POA: Diagnosis not present

## 2016-02-01 DIAGNOSIS — Z96653 Presence of artificial knee joint, bilateral: Secondary | ICD-10-CM | POA: Diagnosis present

## 2016-02-01 DIAGNOSIS — I129 Hypertensive chronic kidney disease with stage 1 through stage 4 chronic kidney disease, or unspecified chronic kidney disease: Secondary | ICD-10-CM | POA: Diagnosis present

## 2016-02-09 ENCOUNTER — Emergency Department (HOSPITAL_COMMUNITY)
Admission: EM | Admit: 2016-02-09 | Discharge: 2016-02-09 | Disposition: A | Discharge status: Home / Self Care | Payer: Medicare Other | Attending: Emergency Medicine | Admitting: Emergency Medicine

## 2016-02-09 ENCOUNTER — Encounter (HOSPITAL_COMMUNITY): Payer: Self-pay | Admitting: Emergency Medicine

## 2016-02-09 DIAGNOSIS — I10 Essential (primary) hypertension: Secondary | ICD-10-CM | POA: Diagnosis not present

## 2016-02-09 DIAGNOSIS — Z79899 Other long term (current) drug therapy: Secondary | ICD-10-CM | POA: Diagnosis not present

## 2016-02-09 DIAGNOSIS — J449 Chronic obstructive pulmonary disease, unspecified: Secondary | ICD-10-CM | POA: Insufficient documentation

## 2016-02-09 DIAGNOSIS — M549 Dorsalgia, unspecified: Secondary | ICD-10-CM | POA: Insufficient documentation

## 2016-02-09 HISTORY — DX: Unspecified dementia, unspecified severity, without behavioral disturbance, psychotic disturbance, mood disturbance, and anxiety: F03.90

## 2016-02-09 HISTORY — DX: Essential (primary) hypertension: I10

## 2016-02-09 HISTORY — DX: Chronic obstructive pulmonary disease, unspecified: J44.9

## 2016-02-09 HISTORY — DX: Major depressive disorder, single episode, unspecified: F32.9

## 2016-02-09 HISTORY — DX: Depression, unspecified: F32.A

## 2016-02-09 HISTORY — DX: Dysphagia, unspecified: R13.10

## 2016-02-09 HISTORY — DX: Scoliosis, unspecified: M41.9

## 2016-02-09 LAB — URINALYSIS, ROUTINE W REFLEX MICROSCOPIC
Bilirubin Urine: NEGATIVE
Glucose, UA: NEGATIVE mg/dL
Hgb urine dipstick: NEGATIVE
Ketones, ur: NEGATIVE mg/dL
Leukocytes, UA: NEGATIVE
Nitrite: NEGATIVE
Protein, ur: NEGATIVE mg/dL
Specific Gravity, Urine: 1.009 (ref 1.005–1.030)
pH: 5.5 (ref 5.0–8.0)

## 2016-02-09 MED ORDER — DONEPEZIL HCL 10 MG PO TABS: 10.0000 mg | ORAL_TABLET | Freq: Every day | ORAL | Status: DC

## 2016-02-09 MED ORDER — MELOXICAM 7.5 MG PO TABS: 7.5000 mg | ORAL_TABLET | Freq: Every day | ORAL | Status: DC

## 2016-02-09 MED ORDER — 3-IN-1 BEDSIDE TOILET MISC: 1.0000 [IU] | Freq: Once | Status: DC

## 2016-02-09 MED ORDER — METOPROLOL SUCCINATE ER 50 MG PO TB24: 50.0000 mg | ORAL_TABLET | Freq: Every day | ORAL | Status: DC

## 2016-02-09 MED ORDER — TRAZODONE HCL 50 MG PO TABS: 50.0000 mg | ORAL_TABLET | Freq: Two times a day (BID) | ORAL | Status: DC

## 2016-02-09 MED ORDER — LIDOCAINE 5 % EX PTCH: 1.0000 | MEDICATED_PATCH | CUTANEOUS | Status: DC

## 2016-02-09 MED ORDER — NIFEDIPINE ER OSMOTIC RELEASE 90 MG PO TB24: 90.0000 mg | ORAL_TABLET | Freq: Every day | ORAL | Status: DC

## 2016-02-09 NOTE — Care Management Note (Signed)
Case Management Note  Patient Details  Name: Roberta BeringDorothy Griffin MRN: [id number] Date of Birth: 08/31/1925  Subjective/Objective:                  82 yo female pt here with family c/o lower and upper back pain chronic in nature worse today;  hx of dementia.  From home with adult daughter.   Action/Plan: Follow for disposition needs. [facility]/Arrange home health services.   Expected Discharge Date:  02/10/16               Expected Discharge Plan:  Home w Home Health Services  In-House Referral:  NA  Discharge planning Services  CM Consult  Post Acute Care Choice:  Home Health Choice offered to:  Patient, Adult Children  DME Arranged:  3-N-1 DME Agency:  [facility]  HH Arranged:  RN, Disease Management, PT, soical work [employer] Agency:  [facility]  Status of Service:  Completed, signed off  If discussed at [employer] Meetings, dates discussed:    Additional Comments: Camellia J. Lucretia RoersWood, RN, BSN, [employer]CM [phone] Spoke with pt at bedside regarding discharge planning for [facility]. Offered pt list of home health agencies to choose from.  [facility]. Katharina Caperrew Wilke [facility] notified. DME needs identified at this time include 3N1.    Oletta CohnWood, Camellia, RN 02/09/2016, 2:18 PM

## 2016-02-09 NOTE — ED Provider Notes (Signed)
CSN: [id number]     Arrival date & time 02/09/16  1011 History   First MD Initiated Contact with Patient 02/09/16 1223     Chief Complaint  Patient presents with  . Back Pain     (Consider location/radiation/quality/duration/timing/severity/associated sxs/prior Treatment) Patient is a 82 y.o. female presenting with back pain.  Back Pain Location:  Generalized Quality:  Aching Radiates to:  Does not radiate Pain severity:  No pain Onset quality:  Gradual Timing:  Intermittent Chronicity:  Chronic Associated symptoms: no chest pain, no dysuria and no pelvic pain     Past Medical History  Diagnosis Date  . Scoliosis   . Dementia   . COPD (chronic obstructive pulmonary disease) (HCC)   . Hypertension   . Dysphagia   . Depression    History reviewed. No pertinent past surgical history. History reviewed. No pertinent family history. Social History  Substance Use Topics  . Smoking status: Never Smoker   . Smokeless tobacco: None  . Alcohol Use: No   OB History    No data available     Review of Systems  Cardiovascular: Negative for chest pain and leg swelling.  Endocrine: Negative for polydipsia, polyphagia and polyuria.  Genitourinary: Negative for dysuria, urgency and pelvic pain.  Musculoskeletal: Positive for back pain.  All other systems reviewed and are negative.     Allergies  Review of patient's allergies indicates no known allergies.  Home Medications   Prior to Admission medications   Medication Sig Start Date End Date Taking? Authorizing Provider  donepezil (ARICEPT) 10 MG tablet Take 1 tablet (10 mg total) by mouth at bedtime. 02/09/16   Marily MemosJason Mesner, MD  lidocaine (LIDODERM) 5 % Place 1 patch onto the skin daily. Remove & Discard patch within 12 hours or as directed by MD 02/09/16   Marily MemosJason Mesner, MD  meloxicam (MOBIC) 7.5 MG tablet Take 1 tablet (7.5 mg total) by mouth daily. 02/09/16   Marily MemosJason Mesner, MD  metoprolol succinate (TOPROL-XL) 50 MG 24 hr  tablet Take 1 tablet (50 mg total) by mouth daily. Take with or immediately following a meal. 02/09/16   Marily MemosJason Mesner, MD  Misc. Devices (3-IN-1 BEDSIDE TOILET) MISC 1 Units by Does not apply route once. 02/09/16   Marily MemosJason Mesner, MD  NIFEdipine (PROCARDIA XL/ADALAT-CC) 90 MG 24 hr tablet Take 1 tablet (90 mg total) by mouth daily. 02/09/16   Marily MemosJason Mesner, MD  traZODone (DESYREL) 50 MG tablet Take 1 tablet (50 mg total) by mouth 2 (two) times daily. 02/09/16   Barbara CowerJason Mesner, MD   BP 118/70 mmHg  Pulse 56  Temp(Src) 97.4 F (36.3 C) (Oral)  Resp 18  SpO2 100% Physical Exam  Constitutional: She appears well-developed and well-nourished.  HENT:  Head: Normocephalic and atraumatic.  Neck: Normal range of motion.  Cardiovascular: Normal rate and regular rhythm.   Pulmonary/Chest: No stridor. No respiratory distress.  Abdominal: Soft. She exhibits no distension. There is no tenderness.  Musculoskeletal: Normal range of motion. She exhibits no edema or tenderness.  Neurological: She is alert.  Nursing note and vitals reviewed.   ED Course  Procedures (including critical care time) Labs Review Labs Reviewed  URINALYSIS, ROUTINE W REFLEX MICROSCOPIC (NOT AT [facility])    Imaging Review No results found. I have personally reviewed and evaluated these images and lab results as part of my medical decision-making.   EKG Interpretation None      MDM   Final diagnoses:  Bilateral back pain, unspecified location  Patient she is complaining of her back pain however she is more here for social visit. She has no back pain right now and when she does it relatively relieved with activity and/or meloxicam. Her [place] and needs help finding primary doctor, physical therapy and medication needs. She is also considering put the patient in different living facility [place]. With these requests I did refill the medications that she had with her. I  also ordered for her to have some home equipment. We will get a physical therapist and the nurse out to help her with her medications and help her with mobility which by to keep her out of the facility as long as possible. Also gave her information for geriatricians area.  New Prescriptions: Discharge Medication List as of 02/09/2016  3:03 PM       I have personally and contemperaneously reviewed labs and imaging and used in my decision making as above.   A medical screening exam was performed and I feel the patient has had an appropriate workup for their chief complaint at this time and likelihood of emergent condition existing is low and thus workup can continue on an outpatient basis.. Their vital signs are stable. They have been counseled on decision, discharge, follow up and which symptoms necessitate immediate return to the emergency department.  They verbally stated understanding and agreement with plan and discharged in stable condition.      Marily MemosJason Mesner, MD 02/10/16 [phone]

## 2016-02-09 NOTE — ED Notes (Signed)
Pt here with family c/o lower and upper back pain chronic in nature worse today; pt with hx of dementia per family

## 2016-02-10 DIAGNOSIS — I10 Essential (primary) hypertension: Secondary | ICD-10-CM | POA: Diagnosis not present

## 2016-02-10 DIAGNOSIS — G4700 Insomnia, unspecified: Secondary | ICD-10-CM | POA: Diagnosis not present

## 2016-02-10 DIAGNOSIS — M545 Low back pain: Secondary | ICD-10-CM | POA: Diagnosis not present

## 2016-02-10 DIAGNOSIS — F0391 Unspecified dementia with behavioral disturbance: Secondary | ICD-10-CM | POA: Diagnosis not present

## 2016-02-14 DIAGNOSIS — F329 Major depressive disorder, single episode, unspecified: Secondary | ICD-10-CM | POA: Diagnosis not present

## 2016-02-14 DIAGNOSIS — G309 Alzheimer's disease, unspecified: Secondary | ICD-10-CM | POA: Diagnosis not present

## 2016-02-14 DIAGNOSIS — M810 Age-related osteoporosis without current pathological fracture: Secondary | ICD-10-CM | POA: Diagnosis not present

## 2016-02-14 DIAGNOSIS — M419 Scoliosis, unspecified: Secondary | ICD-10-CM | POA: Diagnosis not present

## 2016-02-14 DIAGNOSIS — F0281 Dementia in other diseases classified elsewhere with behavioral disturbance: Secondary | ICD-10-CM | POA: Diagnosis not present

## 2016-02-14 DIAGNOSIS — G4700 Insomnia, unspecified: Secondary | ICD-10-CM | POA: Diagnosis not present

## 2016-02-14 DIAGNOSIS — R443 Hallucinations, unspecified: Secondary | ICD-10-CM | POA: Diagnosis not present

## 2016-02-14 DIAGNOSIS — M1990 Unspecified osteoarthritis, unspecified site: Secondary | ICD-10-CM | POA: Diagnosis not present

## 2016-02-14 DIAGNOSIS — I10 Essential (primary) hypertension: Secondary | ICD-10-CM | POA: Diagnosis not present

## 2016-02-14 DIAGNOSIS — J449 Chronic obstructive pulmonary disease, unspecified: Secondary | ICD-10-CM | POA: Diagnosis not present

## 2016-02-14 DIAGNOSIS — R1310 Dysphagia, unspecified: Secondary | ICD-10-CM | POA: Diagnosis not present

## 2016-02-14 DIAGNOSIS — M545 Low back pain: Secondary | ICD-10-CM | POA: Diagnosis not present

## 2016-02-15 DIAGNOSIS — M419 Scoliosis, unspecified: Secondary | ICD-10-CM | POA: Diagnosis not present

## 2016-02-15 DIAGNOSIS — G309 Alzheimer's disease, unspecified: Secondary | ICD-10-CM | POA: Diagnosis not present

## 2016-02-15 DIAGNOSIS — F0281 Dementia in other diseases classified elsewhere with behavioral disturbance: Secondary | ICD-10-CM | POA: Diagnosis not present

## 2016-02-15 DIAGNOSIS — J449 Chronic obstructive pulmonary disease, unspecified: Secondary | ICD-10-CM | POA: Diagnosis not present

## 2016-02-15 DIAGNOSIS — R443 Hallucinations, unspecified: Secondary | ICD-10-CM | POA: Diagnosis not present

## 2016-02-15 DIAGNOSIS — M545 Low back pain: Secondary | ICD-10-CM | POA: Diagnosis not present

## 2016-02-17 ENCOUNTER — Encounter (HOSPITAL_COMMUNITY): Payer: Self-pay | Admitting: Emergency Medicine

## 2016-02-17 ENCOUNTER — Emergency Department (HOSPITAL_COMMUNITY): Payer: Medicare Other

## 2016-02-17 ENCOUNTER — Inpatient Hospital Stay (HOSPITAL_COMMUNITY)
Admission: EM | Admit: 2016-02-17 | Discharge: 2016-02-23 | DRG: 175 | Disposition: A | Discharge status: Skilled Nursing Facility | Payer: Medicare Other | Attending: Internal Medicine | Admitting: Internal Medicine

## 2016-02-17 DIAGNOSIS — R2990 Unspecified symptoms and signs involving the nervous system: Secondary | ICD-10-CM | POA: Diagnosis not present

## 2016-02-17 DIAGNOSIS — I639 Cerebral infarction, unspecified: Secondary | ICD-10-CM | POA: Diagnosis not present

## 2016-02-17 DIAGNOSIS — M25552 Pain in left hip: Secondary | ICD-10-CM

## 2016-02-17 DIAGNOSIS — E785 Hyperlipidemia, unspecified: Secondary | ICD-10-CM | POA: Insufficient documentation

## 2016-02-17 DIAGNOSIS — M25551 Pain in right hip: Secondary | ICD-10-CM | POA: Diagnosis present

## 2016-02-17 DIAGNOSIS — J90 Pleural effusion, not elsewhere classified: Secondary | ICD-10-CM | POA: Diagnosis not present

## 2016-02-17 DIAGNOSIS — R4781 Slurred speech: Secondary | ICD-10-CM | POA: Diagnosis not present

## 2016-02-17 DIAGNOSIS — Z9181 History of falling: Secondary | ICD-10-CM

## 2016-02-17 DIAGNOSIS — Z791 Long term (current) use of non-steroidal anti-inflammatories (NSAID): Secondary | ICD-10-CM | POA: Diagnosis not present

## 2016-02-17 DIAGNOSIS — K869 Disease of pancreas, unspecified: Secondary | ICD-10-CM | POA: Diagnosis not present

## 2016-02-17 DIAGNOSIS — G309 Alzheimer's disease, unspecified: Secondary | ICD-10-CM | POA: Diagnosis present

## 2016-02-17 DIAGNOSIS — I129 Hypertensive chronic kidney disease with stage 1 through stage 4 chronic kidney disease, or unspecified chronic kidney disease: Secondary | ICD-10-CM | POA: Diagnosis present

## 2016-02-17 DIAGNOSIS — N183 Chronic kidney disease, stage 3 unspecified: Secondary | ICD-10-CM | POA: Diagnosis present

## 2016-02-17 DIAGNOSIS — E041 Nontoxic single thyroid nodule: Secondary | ICD-10-CM | POA: Diagnosis present

## 2016-02-17 DIAGNOSIS — R4701 Aphasia: Secondary | ICD-10-CM | POA: Diagnosis present

## 2016-02-17 DIAGNOSIS — G9340 Encephalopathy, unspecified: Secondary | ICD-10-CM | POA: Diagnosis not present

## 2016-02-17 DIAGNOSIS — R059 Cough, unspecified: Secondary | ICD-10-CM

## 2016-02-17 DIAGNOSIS — F0391 Unspecified dementia with behavioral disturbance: Secondary | ICD-10-CM

## 2016-02-17 DIAGNOSIS — J449 Chronic obstructive pulmonary disease, unspecified: Secondary | ICD-10-CM | POA: Diagnosis present

## 2016-02-17 DIAGNOSIS — F329 Major depressive disorder, single episode, unspecified: Secondary | ICD-10-CM | POA: Diagnosis present

## 2016-02-17 DIAGNOSIS — Z8673 Personal history of transient ischemic attack (TIA), and cerebral infarction without residual deficits: Secondary | ICD-10-CM | POA: Diagnosis not present

## 2016-02-17 DIAGNOSIS — R471 Dysarthria and anarthria: Secondary | ICD-10-CM | POA: Diagnosis present

## 2016-02-17 DIAGNOSIS — E876 Hypokalemia: Secondary | ICD-10-CM | POA: Diagnosis not present

## 2016-02-17 DIAGNOSIS — K863 Pseudocyst of pancreas: Secondary | ICD-10-CM | POA: Diagnosis not present

## 2016-02-17 DIAGNOSIS — I2690 Septic pulmonary embolism without acute cor pulmonale: Secondary | ICD-10-CM | POA: Diagnosis not present

## 2016-02-17 DIAGNOSIS — I2699 Other pulmonary embolism without acute cor pulmonale: Secondary | ICD-10-CM | POA: Diagnosis not present

## 2016-02-17 DIAGNOSIS — Z66 Do not resuscitate: Secondary | ICD-10-CM | POA: Diagnosis present

## 2016-02-17 DIAGNOSIS — D649 Anemia, unspecified: Secondary | ICD-10-CM | POA: Diagnosis present

## 2016-02-17 DIAGNOSIS — I10 Essential (primary) hypertension: Secondary | ICD-10-CM

## 2016-02-17 DIAGNOSIS — I6789 Other cerebrovascular disease: Secondary | ICD-10-CM | POA: Diagnosis not present

## 2016-02-17 DIAGNOSIS — M160 Bilateral primary osteoarthritis of hip: Secondary | ICD-10-CM | POA: Diagnosis not present

## 2016-02-17 DIAGNOSIS — F0280 Dementia in other diseases classified elsewhere without behavioral disturbance: Secondary | ICD-10-CM | POA: Diagnosis present

## 2016-02-17 DIAGNOSIS — K8689 Other specified diseases of pancreas: Secondary | ICD-10-CM | POA: Diagnosis not present

## 2016-02-17 DIAGNOSIS — I4510 Unspecified right bundle-branch block: Secondary | ICD-10-CM | POA: Diagnosis present

## 2016-02-17 DIAGNOSIS — F419 Anxiety disorder, unspecified: Secondary | ICD-10-CM | POA: Diagnosis not present

## 2016-02-17 DIAGNOSIS — R1310 Dysphagia, unspecified: Secondary | ICD-10-CM | POA: Diagnosis not present

## 2016-02-17 DIAGNOSIS — I313 Pericardial effusion (noninflammatory): Secondary | ICD-10-CM | POA: Diagnosis present

## 2016-02-17 DIAGNOSIS — G8321 Monoplegia of upper limb affecting right dominant side: Secondary | ICD-10-CM | POA: Diagnosis present

## 2016-02-17 DIAGNOSIS — S299XXA Unspecified injury of thorax, initial encounter: Secondary | ICD-10-CM | POA: Diagnosis not present

## 2016-02-17 DIAGNOSIS — F0390 Unspecified dementia without behavioral disturbance: Secondary | ICD-10-CM | POA: Diagnosis not present

## 2016-02-17 DIAGNOSIS — K5730 Diverticulosis of large intestine without perforation or abscess without bleeding: Secondary | ICD-10-CM | POA: Diagnosis not present

## 2016-02-17 DIAGNOSIS — R4702 Dysphasia: Secondary | ICD-10-CM | POA: Diagnosis present

## 2016-02-17 DIAGNOSIS — G9349 Other encephalopathy: Secondary | ICD-10-CM | POA: Diagnosis not present

## 2016-02-17 DIAGNOSIS — R29810 Facial weakness: Secondary | ICD-10-CM | POA: Diagnosis not present

## 2016-02-17 DIAGNOSIS — R05 Cough: Secondary | ICD-10-CM

## 2016-02-17 DIAGNOSIS — E860 Dehydration: Secondary | ICD-10-CM | POA: Diagnosis present

## 2016-02-17 DIAGNOSIS — R9389 Abnormal findings on diagnostic imaging of other specified body structures: Secondary | ICD-10-CM

## 2016-02-17 LAB — I-STAT CHEM 8, ED
BUN: 16 mg/dL (ref 6–20)
Calcium, Ion: 1.25 mmol/L — ABNORMAL HIGH (ref 1.12–1.23)
Chloride: 106 mmol/L (ref 101–111)
Creatinine, Ser: 1.2 mg/dL — ABNORMAL HIGH (ref 0.44–1.00)
Glucose, Bld: 92 mg/dL (ref 65–99)
HCT: 36 % (ref 36.0–46.0)
Hemoglobin: 12.2 g/dL (ref 12.0–15.0)
Potassium: 3.5 mmol/L (ref 3.5–5.1)
Sodium: 141 mmol/L (ref 135–145)
TCO2: 25 mmol/L (ref 0–100)

## 2016-02-17 LAB — BASIC METABOLIC PANEL
BUN: 14 mg/dL (ref 4–21)
Creatinine: 1.2 mg/dL — AB (ref 0.5–1.1)
Glucose: 93 mg/dL
Potassium: 3.5 mmol/L (ref 3.4–5.3)
Sodium: 138 mmol/L (ref 137–147)

## 2016-02-17 LAB — URINALYSIS, ROUTINE W REFLEX MICROSCOPIC
Bilirubin Urine: NEGATIVE
Glucose, UA: NEGATIVE mg/dL
Hgb urine dipstick: NEGATIVE
Ketones, ur: NEGATIVE mg/dL
Leukocytes, UA: NEGATIVE
Nitrite: NEGATIVE
Protein, ur: NEGATIVE mg/dL
Specific Gravity, Urine: 1.019 (ref 1.005–1.030)
pH: 5.5 (ref 5.0–8.0)

## 2016-02-17 LAB — DIFFERENTIAL
Basophils Absolute: 0 10*3/uL (ref 0.0–0.1)
Basophils Relative: 0 %
Eosinophils Absolute: 0.1 10*3/uL (ref 0.0–0.7)
Eosinophils Relative: 1 %
Lymphocytes Relative: 22 %
Lymphs Abs: 1.5 10*3/uL (ref 0.7–4.0)
Monocytes Absolute: 0.6 10*3/uL (ref 0.1–1.0)
Monocytes Relative: 9 %
Neutro Abs: 4.8 10*3/uL (ref 1.7–7.7)
Neutrophils Relative %: 68 %

## 2016-02-17 LAB — COMPREHENSIVE METABOLIC PANEL
ALT: 7 U/L — ABNORMAL LOW (ref 14–54)
AST: 19 U/L (ref 15–41)
Albumin: 3.5 g/dL (ref 3.5–5.0)
Alkaline Phosphatase: 46 U/L (ref 38–126)
Anion gap: 7 (ref 5–15)
BUN: 14 mg/dL (ref 6–20)
CO2: 22 mmol/L (ref 22–32)
Calcium: 9.4 mg/dL (ref 8.9–10.3)
Chloride: 109 mmol/L (ref 101–111)
Creatinine, Ser: 1.19 mg/dL — ABNORMAL HIGH (ref 0.44–1.00)
GFR calc Af Amer: 46 mL/min — ABNORMAL LOW (ref >60)
GFR calc non Af Amer: 40 mL/min — ABNORMAL LOW (ref >60)
Glucose, Bld: 93 mg/dL (ref 65–99)
Potassium: 3.5 mmol/L (ref 3.5–5.1)
Sodium: 138 mmol/L (ref 135–145)
Total Bilirubin: 0.3 mg/dL (ref 0.3–1.2)
Total Protein: 6.4 g/dL — ABNORMAL LOW (ref 6.5–8.1)

## 2016-02-17 LAB — CBC
HCT: 36.1 % (ref 36.0–46.0)
Hemoglobin: 11.9 g/dL — ABNORMAL LOW (ref 12.0–15.0)
MCH: 29.2 pg (ref 26.0–34.0)
MCHC: 33 g/dL (ref 30.0–36.0)
MCV: 88.5 fL (ref 78.0–100.0)
Platelets: 157 10*3/uL (ref 150–400)
RBC: 4.08 MIL/uL (ref 3.87–5.11)
RDW: 12.7 % (ref 11.5–15.5)
WBC: 7 10*3/uL (ref 4.0–10.5)

## 2016-02-17 LAB — CBC AND DIFFERENTIAL
HCT: 36 % (ref 36–46)
Hemoglobin: 11.9 g/dL — AB (ref 12.0–16.0)
Platelets: 157 10*3/uL (ref 150–399)
WBC: 7 10^3/mL

## 2016-02-17 LAB — CBG MONITORING, ED: Glucose-Capillary: 108 mg/dL — ABNORMAL HIGH (ref 65–99)

## 2016-02-17 LAB — HEPATIC FUNCTION PANEL: Bilirubin, Total: 0.3 mg/dL

## 2016-02-17 LAB — I-STAT TROPONIN, ED: Troponin i, poc: 0 ng/mL (ref 0.00–0.08)

## 2016-02-17 LAB — PROTIME-INR
INR: 1.14 (ref 0.00–1.49)
Prothrombin Time: 14.8 seconds (ref 11.6–15.2)

## 2016-02-17 LAB — APTT: aPTT: 29 seconds (ref 24–37)

## 2016-02-17 MED ORDER — ASPIRIN 325 MG PO TABS
325.0000 mg | ORAL_TABLET | Freq: Every day | ORAL | Status: DC
  Administered 2016-02-18 – 2016-02-22 (×5): 325 mg via ORAL
  Filled 2016-02-17 (×5): qty 1

## 2016-02-17 MED ORDER — HALOPERIDOL LACTATE 5 MG/ML IJ SOLN
2.0000 mg | Freq: Every evening | INTRAMUSCULAR | Status: DC | PRN
  Administered 2016-02-18 (×2): 2 mg via INTRAVENOUS
  Filled 2016-02-17 (×2): qty 1

## 2016-02-17 MED ORDER — ENOXAPARIN SODIUM 40 MG/0.4ML ~~LOC~~ SOLN
40.0000 mg | Freq: Every day | SUBCUTANEOUS | Status: DC
  Administered 2016-02-18 – 2016-02-19 (×2): 40 mg via SUBCUTANEOUS
  Filled 2016-02-17 (×2): qty 0.4

## 2016-02-17 MED ORDER — LABETALOL HCL 5 MG/ML IV SOLN: 5.0000 mg | Freq: Three times a day (TID) | INTRAVENOUS | Status: DC | PRN

## 2016-02-17 MED ORDER — STROKE: EARLY STAGES OF RECOVERY BOOK
Freq: Once | Status: AC
  Administered 2016-02-18: 01:00:00
  Filled 2016-02-17: qty 1

## 2016-02-17 MED ORDER — ASPIRIN 300 MG RE SUPP: 300.0000 mg | Freq: Every day | RECTAL | Status: DC

## 2016-02-17 MED ORDER — POTASSIUM CHLORIDE IN NACL 20-0.9 MEQ/L-% IV SOLN
INTRAVENOUS | Status: AC
  Administered 2016-02-18: via INTRAVENOUS
  Filled 2016-02-17: qty 1000

## 2016-02-17 NOTE — ED Notes (Signed)
Pt 's daughter at the bedside.

## 2016-02-17 NOTE — H&P (Signed)
History and Physical  Patient Name: Roberta Griffin     ZOX:[id number]    DOB: 08-13-1926    DOA: 02/17/2016 PCP: No primary care provider on file.   Patient coming from: Home     Chief Complaint: Slurred and abnormal speech  HPI: Roberta Griffin is a 82 y.o. female with a past medical history significant for dementia, HTN, and CKD who presents with abnormal speech for 3-4 days.  The patient has moderate dementia and lives with her daughter, just moved to [place] in the last few weeks from [place].  Over the last few days, her daughter has noticed that she has been falling more often, never injuring herself and with no obvious focal weakness, but also accompanied by difficulty getting words out and slurring her words.  Today, her daughter felt that her face was drooping, and her speech was worse, and so she brought her to the ER.  ED course: -Afebrile, hemodynamically stable -Sodium 138, potassium 3.5, creatinine 1.2 (no baseline), WBC 7K, hemoglobin 11.9 (again no baseline) -Troponin negative, UA normal, coags normal -CT head unremarkable for age -ECG showed sinus rhythm with an RBBB -Neurology were consulted who noted right sided numbness, aphasia, and right sided facial droop and so TRH were asked to evaluate for admission     Review of Systems:  Pt complains of difficulty speaking, falls. All other systems negative except as just noted or noted in the history of present illness.  Past Medical History  Diagnosis Date  . Scoliosis   . Dementia   . COPD (chronic obstructive pulmonary disease) (HCC)   . Hypertension   . Dysphagia   . Depression     Past Surgical History  Procedure Laterality Date  . Shoulder surgery    . Knee surgery    . Abdominal hysterectomy    . Vitrectomy and cataract      Social History: Patient lives with her daughter.  She is from [place] originally, came to [place] with her daughter 2 weeks ago.  Patient walks unassisted (refuses to use walker).  Never smoker.    Daughter is POA.  No Known Allergies  Family history: family history is negative for Stroke.  Otherwise family healthy.  Prior to Admission medications   Medication Sig Start Date End Date Taking? Authorizing Provider  donepezil (ARICEPT) 10 MG tablet Take 1 tablet (10 mg total) by mouth at bedtime. 02/09/16  Yes Marily Memos, MD  meloxicam (MOBIC) 7.5 MG tablet Take 1 tablet (7.5 mg total) by mouth daily. 02/09/16  Yes Marily Memos, MD  metoprolol succinate (TOPROL-XL) 50 MG 24 hr tablet Take 1 tablet (50 mg total) by mouth daily. Take with or immediately following a meal. 02/09/16  Yes Marily Memos, MD  NIFEdipine (PROCARDIA XL/ADALAT-CC) 90 MG 24 hr tablet Take 1 tablet (90 mg total) by mouth daily. 02/09/16  Yes Marily Memos, MD  risperiDONE (RISPERDAL) 1 MG tablet Take 1 mg by mouth 2 (two) times daily. 02/10/16  Yes Historical Provider, MD  traZODone (DESYREL) 50 MG tablet Take 1 tablet (50 mg total) by mouth 2 (two) times daily. 02/09/16  Yes Jason Mesner, MD  lidocaine (LIDODERM) 5 % Place 1 patch onto the skin daily. Remove & Discard patch within 12 hours or as directed by MD 02/09/16   Marily Memos, MD  Misc. Devices (3-IN-1 BEDSIDE TOILET) MISC 1 Units by Does not apply route once. 02/09/16   Marily Memos, MD     Physical Exam: BP 142/71 mmHg  Pulse  71  Temp(Src) 97.8 F (36.6 C) (Oral)  Resp 16  SpO2 100% General appearance: Frail elderly adult female, alert and in no acute distress.   Eyes: Anicteric, conjunctiva pink, lids and lashes normal.   Right pupil deformed, left reactive and normal. ENT: No nasal deformity, discharge, or epistaxis.  OP dry without lesions.  Dentures in place. Lymph: No cervical, supraclavicular or axillary lymphadenopathy. Skin: Warm and dry.  No jaundice.  No suspicious rashes or lesions. Cardiac: RRR, nl S1-S2, no murmurs appreciated.  Capillary refill is brisk.  JVP normal.  No LE edema.  Radial and DP pulses 2+ and symmetric. Respiratory: Normal  respiratory rate and rhythm.  CTAB without rales or wheezes. GI: Abdomen soft without rigidity.  No TTP. No ascites, distension.   MSK: No deformities or effusions. Neuro: LEFT pupil are 4 mm and reactive to 3 mm.Right is deformed.  RIght ptosis.  Extraocular movements appear intact, without nystagmus. Cranial nerve 5 with subtle right sided droop. Cranial nerve 7 is symmetrical on my exam. Cranial nerve 8 is within normal limits. Cranial nerves 9 and 10 reveal equal palate elevation. Cranial nerve 11 reveals sternocleidomastoid strong. Cranial nerve 12 is midline. I think I note a 4+/5 on all RIGHT side and 5/5 on LEFT strength testing.   The patient is oriented to place and sitaution. Speech is stuttering and slightly dysarhtric. Recall, recent and remote, as well as general fund of knowledge seem mildly imparied. Attention span and concentration are within normal limits.   Psych: Behavior appropriate.  Affect pleasant.  No evidence of aural or visual hallucinations or delusions.       Labs on Admission:  I have personally reviewed following labs and imaging studies: CBC:  Recent Labs Lab 02/17/16 1746 02/17/16 1818  WBC 7.0  --   NEUTROABS 4.8  --   HGB 11.9* 12.2  HCT 36.1 36.0  MCV 88.5  --   PLT 157  --    Basic Metabolic Panel:  Recent Labs Lab 02/17/16 1746 02/17/16 1818  NA 138 141  K 3.5 3.5  CL 109 106  CO2 22  --   GLUCOSE 93 92  BUN 14 16  CREATININE 1.19* 1.20*  CALCIUM 9.4  --    GFR: CrCl cannot be calculated (Unknown ideal weight.). Liver Function Tests:  Recent Labs Lab 02/17/16 1746  AST 19  ALT 7*  ALKPHOS 46  BILITOT 0.3  PROT 6.4*  ALBUMIN 3.5   Coagulation Profile:  Recent Labs Lab 02/17/16 1746  INR 1.14   CBG:  Recent Labs Lab 02/17/16 2111  GLUCAP 108*     Radiological Exams on Admission: CT head without contrast 02/17/2016  Ct Head Wo Contrast  02/17/2016  CLINICAL DATA:  Per pt daughter, c/o slurred speech for a  couple of days. Pt family also states the L side of her face has been drooping. Pt has alzheimers and dementia, baseline is confused. Pt answered "[age]" for how old she was. Pt smiling. EXAM: CT HEAD WITHOUT CONTRAST TECHNIQUE: Contiguous axial images were obtained from the base of the skull through the vertex without intravenous contrast. COMPARISON:  None. FINDINGS: No acute intracranial hemorrhage. No focal mass lesion. No CT evidence of acute infarction. No midline shift or mass effect. No hydrocephalus. Basilar cisterns are patent. There are periventricular and subcortical white matter hypodensities. Generalized cortical atrophy. Paranasal sinuses and mastoid air cells are clear. Orbits are clear. IMPRESSION: 1. No acute intracranial findings. 2. Marked atrophy and  moderate white matter microvascular disease. Electronically Signed   By: Genevive BiStewart  Edmunds M.D.   On: 02/17/2016 19:13     EKG: Independently reviewed. Rate 72, sinus rhythm, QTc 483, RBBB.  No previous for comparison.    Assessment/Plan 1. Acute Stroke:  This is new.  MRI pending.   -Admit to telemetry -Neuro checks, NIHSS per protocol -Daily aspirin 325 mg -Permissive hypertension for now, labetalol for extreme hypertension -Failed swallow screen in ER because of history of dysphagia (underwent swallow once that was normal, but intermittently has problems swallowing) -Lipids, hemoglobin A1c ordered -Carotid doppler, MRA per Neurology, ordered -Echocardiogram ordered -PT/OT/SLP consultation -Consult to Neurology, appreciate recommendations   2. Dementia:  -Hold risperidone and donepezil -Haldol QHS PRN for agitation  3. HTN:  -Permissive hypertension for now, hold metoprolol and nifedipine, restart when cleared by SLP tomorrow likely -Labetalol PRN for extreme pressure  4. CKD III:  Stable.   5. Anemia, normocytic:  Stable.        DVT prophylaxis: Lovenox  Code Status: DO NOT RESUSCITATE  Family  Communication: Daughter/POA present at bedside  Disposition Plan: Anticipate Stroke work up as above and consult to ancillary services.  Expect discharge within 2-3 days. Consults called: Neurology, Dr. Amada JupiterKirkpatrick has seen patient. Admission status: Telemetry, INPATIENT status  Core measures: -VTE prophylaxis ordered at time of admission -Aspirin ordered at admission -Atrial fibrillation: not present -tPA not given because of duration of symptoms outside window -Dysphagia screen ordered in ER -Lipids ordered -PT eval ordered -Non-smoker    Medical decision making: Patient seen at 11:29 PM on 02/17/2016.  The patient was discussed with Dr. Daryl EasternMiester. What exists of the patient's chart was reviewed in depth.  Clinical condition: stable.       Alberteen SamChristopher P Danford [facility] Pager [phone]

## 2016-02-17 NOTE — ED Provider Notes (Signed)
CSN: [id number]     Arrival date & time 02/17/16  1738 History   First MD Initiated Contact with Patient 02/17/16 2037     Chief Complaint  Patient presents with  . Aphasia  . Stroke Symptoms     (Consider location/radiation/quality/duration/timing/severity/associated sxs/prior Treatment) Patient is a 82 y.o. female presenting with neurologic complaint. The history is provided by the patient and a caregiver.  Neurologic Problem This is a new problem. The current episode started in the past 7 days. The problem occurs constantly. The problem has been unchanged. Associated symptoms include numbness and weakness. Pertinent negatives include no abdominal pain, chest pain, chills, congestion, coughing, fever, nausea, neck pain, rash, sore throat or vomiting. Nothing aggravates the symptoms. She has tried nothing for the symptoms.    Past Medical History  Diagnosis Date  . Scoliosis   . Dementia   . COPD (chronic obstructive pulmonary disease) (HCC)   . Hypertension   . Dysphagia   . Depression    Past Surgical History  Procedure Laterality Date  . Shoulder surgery    . Knee surgery    . Abdominal hysterectomy    . Vitrectomy and cataract     Family History  Problem Relation Age of Onset  . Stroke Neg Hx    Social History  Substance Use Topics  . Smoking status: Never Smoker   . Smokeless tobacco: None  . Alcohol Use: No   OB History    No data available     Review of Systems  Constitutional: Negative for fever and chills.  HENT: Negative for congestion and sore throat.   Eyes: Negative for pain.  Respiratory: Negative for cough and shortness of breath.   Cardiovascular: Negative for chest pain and palpitations.  Gastrointestinal: Negative for nausea, vomiting, abdominal pain and diarrhea.  Genitourinary: Negative for dysuria and flank pain.  Musculoskeletal: Negative for back pain and neck pain.  Skin: Negative for rash.  Allergic/Immunologic: Negative.    Neurological: Positive for facial asymmetry, speech difficulty, weakness and numbness. Negative for dizziness and light-headedness.  Psychiatric/Behavioral: Negative for confusion.      Allergies  Review of patient's allergies indicates no known allergies.  Home Medications   Prior to Admission medications   Medication Sig Start Date End Date Taking? Authorizing Provider  donepezil (ARICEPT) 10 MG tablet Take 1 tablet (10 mg total) by mouth at bedtime. 02/09/16  Yes Marily MemosJason Mesner, MD  meloxicam (MOBIC) 7.5 MG tablet Take 1 tablet (7.5 mg total) by mouth daily. 02/09/16  Yes Marily MemosJason Mesner, MD  metoprolol succinate (TOPROL-XL) 50 MG 24 hr tablet Take 1 tablet (50 mg total) by mouth daily. Take with or immediately following a meal. 02/09/16  Yes Marily MemosJason Mesner, MD  NIFEdipine (PROCARDIA XL/ADALAT-CC) 90 MG 24 hr tablet Take 1 tablet (90 mg total) by mouth daily. 02/09/16  Yes Marily MemosJason Mesner, MD  risperiDONE (RISPERDAL) 1 MG tablet Take 1 mg by mouth 2 (two) times daily. 02/10/16  Yes Historical Provider, MD  traZODone (DESYREL) 50 MG tablet Take 1 tablet (50 mg total) by mouth 2 (two) times daily. 02/09/16  Yes Jason Mesner, MD  lidocaine (LIDODERM) 5 % Place 1 patch onto the skin daily. Remove & Discard patch within 12 hours or as directed by MD 02/09/16   Marily MemosJason Mesner, MD  Misc. Devices (3-IN-1 BEDSIDE TOILET) MISC 1 Units by Does not apply route once. 02/09/16   Marily MemosJason Mesner, MD   BP 144/71 mmHg  Pulse 83  Temp(Src) 98.2 F (  36.8 C) (Oral)  Resp 18  Ht 5\' 3"  (1.6 m)  Wt 51.393 kg  BMI 20.08 kg/m2  SpO2 98% Physical Exam  Constitutional: She is oriented to person, place, and time. She appears well-developed and well-nourished. No distress.  HENT:  Head: Normocephalic and atraumatic.  Eyes: Conjunctivae and EOM are normal. Pupils are equal, round, and reactive to light.  Neck: Normal range of motion. Neck supple.  Cardiovascular: Normal rate, regular rhythm and normal heart sounds.    Pulmonary/Chest: Effort normal and breath sounds normal. No respiratory distress.  Abdominal: Soft. Bowel sounds are normal. There is no tenderness.  Musculoskeletal: Normal range of motion.  Neurological: She is alert and oriented to person, place, and time. She has normal reflexes. GCS eye subscore is 4. GCS verbal subscore is 5. GCS motor subscore is 6.  Mild R sided facial droop and some difficulty with word finding.   Normal finger to nose bilaterally.   No pronator drift bilaterally.    Skin: Skin is warm and dry. She is not diaphoretic.  Psychiatric: She has a normal mood and affect.    ED Course  Procedures (including critical care time) Labs Review Labs Reviewed  CBC - Abnormal; Notable for the following:    Hemoglobin 11.9 (*)    All other components within normal limits  COMPREHENSIVE METABOLIC PANEL - Abnormal; Notable for the following:    Creatinine, Ser 1.19 (*)    Total Protein 6.4 (*)    ALT 7 (*)    GFR calc non Af Amer 40 (*)    GFR calc Af Amer 46 (*)    All other components within normal limits  LIPID PANEL - Abnormal; Notable for the following:    Cholesterol 214 (*)    LDL Cholesterol 142 (*)    All other components within normal limits  CBG MONITORING, ED - Abnormal; Notable for the following:    Glucose-Capillary 108 (*)    All other components within normal limits  I-STAT CHEM 8, ED - Abnormal; Notable for the following:    Creatinine, Ser 1.20 (*)    Calcium, Ion 1.25 (*)    All other components within normal limits  URINE CULTURE  PROTIME-INR  APTT  DIFFERENTIAL  URINALYSIS, ROUTINE W REFLEX MICROSCOPIC (NOT AT [facility])  HEMOGLOBIN A1C  I-STAT TROPOININ, ED    Imaging Review Ct Head Wo Contrast  02/17/2016  CLINICAL DATA:  Per pt daughter, c/o slurred speech for a couple of days. Pt family also states the L side of her face has been drooping. Pt has alzheimers and dementia, baseline is confused. Pt answered "[birth year]" for how old she was. Pt  smiling. EXAM: CT HEAD WITHOUT CONTRAST TECHNIQUE: Contiguous axial images were obtained from the base of the skull through the vertex without intravenous contrast. COMPARISON:  None. FINDINGS: No acute intracranial hemorrhage. No focal mass lesion. No CT evidence of acute infarction. No midline shift or mass effect. No hydrocephalus. Basilar cisterns are patent. There are periventricular and subcortical white matter hypodensities. Generalized cortical atrophy. Paranasal sinuses and mastoid air cells are clear. Orbits are clear. IMPRESSION: 1. No acute intracranial findings. 2. Marked atrophy and moderate white matter microvascular disease. Electronically Signed   By: Genevive BiStewart  Edmunds M.D.   On: 02/17/2016 19:13   Mr Brain Wo Contrast  02/18/2016  CLINICAL DATA:  82 year old female with increasing difficulty with speech and swallowing for 1 week. Facial droop. Increased falls. Initial encounter. EXAM: MRI HEAD WITHOUT CONTRAST MRA HEAD  WITHOUT CONTRAST TECHNIQUE: Multiplanar, multiecho pulse sequences of the brain and surrounding structures were obtained without intravenous contrast. Angiographic images of the head were obtained using MRA technique without contrast. COMPARISON:  Noncontrast head CT 02/17/2016 FINDINGS: MRI HEAD FINDINGS Metal susceptibility artifact related to a tiny retained metal foreign body in the scalp (series 3, image 28 of the comparison) along the left posterior convexity mildly degrades diffusion weighted imaging. No convincing restricted diffusion or evidence of acute infarction. Major intracranial vascular flow voids are preserved. Mild intracranial artery dolichoectasia. No midline shift, mass effect, ventriculomegaly, extra-axial collection or acute intracranial hemorrhage. Cervicomedullary junction and pituitary are within normal limits. Negative for age visualized cervical spine. Normal bone marrow signal. There is prominent focal hyperostosis of the right frontal bone versus a  small calcified meningioma (series 14, image 22). Minimal mass effect on the adjacent right frontal lobe with no cerebral edema. Small to moderate area of cortical encephalomalacia along the right peri-Rolandic cortex best seen on series 14, image 12. No chronic cerebral blood products identified. Underlying confluent cerebral white matter T2 and FLAIR hyperintensity, in addition to patchy bilateral similar white matter changes. Deep gray matter nuclei, brainstem, and cerebellum are normal for age. Visible internal auditory structures appear normal. Mastoids are clear. No skullbase abnormality identified. Paranasal sinuses are clear. Postoperative changes to the bilateral globes. Negative scalp soft tissues. MRA HEAD FINDINGS Antegrade flow in the posterior circulation with codominant distal vertebral arteries. Normal PICA origins and no distal vertebral stenosis. Patent vertebrobasilar junction and basilar artery without stenosis. Mild ectasia at the basilar tip. SCA and PCA origins are normal. Left posterior communicating artery is present, the right is diminutive or absent. Tortuous left P1 segment. Bilateral PCA branches are normal. Antegrade flow in both ICA siphons. No siphon stenosis. Normal ophthalmic artery origins. Normal MCA and ACA origins. Tortuous A1 segments. Anterior communicating artery and visualized ACA branches are within normal limits. Mild MCA tortuosity. M1 segments and visualized bilateral MCA branches otherwise are normal. IMPRESSION: 1.  No acute intracranial abnormality. 2. Chronic posterior right MCA infarct with superimposed bilateral white matter changes most suggestive of chronic small vessel disease. 3.  Negative intracranial MRA aside from mild arterial tortuosity. 4. Focal hyperostosis of the right frontal bone versus small right frontal meningioma, appears inconsequential. Electronically Signed   By: Odessa Fleming M.D.   On: 02/18/2016 08:19   Dg Chest Port 1 View  02/18/2016  CLINICAL  DATA:  82 year old female with a history of difficulty speaking and falling EXAM: PORTABLE CHEST 1 VIEW COMPARISON:  None. FINDINGS: Cardiomediastinal silhouette enlarged. Calcifications of the aortic arch. No evidence of central vascular congestion. Soft tissue density projects over the upper right mediastinal border, crossing the contours of the clavicular head and upper posterior ribs. Uplifting of the minor fissure No pneumothorax or pleural effusion.  No confluent airspace disease. Surgical changes of prior bilateral reverse shoulder arthroplasty. No displaced fracture. IMPRESSION: No radiographic evidence of acute cardiopulmonary disease. Soft tissue density of the upper right mediastinal border. There is associated uplifting of the minor fissure. Findings are concerning for right upper lobe partial volume loss, and a central obstructing tumor is not excluded. Further evaluation with contrast-enhanced CT is recommended. These results will be called to the ordering clinician or representative by the Radiologist Assistant, and communication documented in the PACS or zVision Dashboard. Cardiomegaly. Aortic atherosclerosis. Signed, Yvone Neu. Loreta Ave, DO [facility] Electronically Signed   By: Gilmer Mor D.O.  On: 02/18/2016 10:45   Mr Maxine Glenn Head/brain Wo Cm  02/18/2016  CLINICAL DATA:  82 year old female with increasing difficulty with speech and swallowing for 1 week. Facial droop. Increased falls. Initial encounter. EXAM: MRI HEAD WITHOUT CONTRAST MRA HEAD WITHOUT CONTRAST TECHNIQUE: Multiplanar, multiecho pulse sequences of the brain and surrounding structures were obtained without intravenous contrast. Angiographic images of the head were obtained using MRA technique without contrast. COMPARISON:  Noncontrast head CT 02/17/2016 FINDINGS: MRI HEAD FINDINGS Metal susceptibility artifact related to a tiny retained metal foreign body in the scalp (series  3, image 28 of the comparison) along the left posterior convexity mildly degrades diffusion weighted imaging. No convincing restricted diffusion or evidence of acute infarction. Major intracranial vascular flow voids are preserved. Mild intracranial artery dolichoectasia. No midline shift, mass effect, ventriculomegaly, extra-axial collection or acute intracranial hemorrhage. Cervicomedullary junction and pituitary are within normal limits. Negative for age visualized cervical spine. Normal bone marrow signal. There is prominent focal hyperostosis of the right frontal bone versus a small calcified meningioma (series 14, image 22). Minimal mass effect on the adjacent right frontal lobe with no cerebral edema. Small to moderate area of cortical encephalomalacia along the right peri-Rolandic cortex best seen on series 14, image 12. No chronic cerebral blood products identified. Underlying confluent cerebral white matter T2 and FLAIR hyperintensity, in addition to patchy bilateral similar white matter changes. Deep gray matter nuclei, brainstem, and cerebellum are normal for age. Visible internal auditory structures appear normal. Mastoids are clear. No skullbase abnormality identified. Paranasal sinuses are clear. Postoperative changes to the bilateral globes. Negative scalp soft tissues. MRA HEAD FINDINGS Antegrade flow in the posterior circulation with codominant distal vertebral arteries. Normal PICA origins and no distal vertebral stenosis. Patent vertebrobasilar junction and basilar artery without stenosis. Mild ectasia at the basilar tip. SCA and PCA origins are normal. Left posterior communicating artery is present, the right is diminutive or absent. Tortuous left P1 segment. Bilateral PCA branches are normal. Antegrade flow in both ICA siphons. No siphon stenosis. Normal ophthalmic artery origins. Normal MCA and ACA origins. Tortuous A1 segments. Anterior communicating artery and visualized ACA branches are  within normal limits. Mild MCA tortuosity. M1 segments and visualized bilateral MCA branches otherwise are normal. IMPRESSION: 1.  No acute intracranial abnormality. 2. Chronic posterior right MCA infarct with superimposed bilateral white matter changes most suggestive of chronic small vessel disease. 3.  Negative intracranial MRA aside from mild arterial tortuosity. 4. Focal hyperostosis of the right frontal bone versus small right frontal meningioma, appears inconsequential. Electronically Signed   By: Odessa Fleming M.D.   On: 02/18/2016 08:19   I have personally reviewed and evaluated these images and lab results as part of my medical decision-making.   EKG Interpretation   Date/Time:  Friday February 17 2016 17:45:00 EDT Ventricular Rate:  72 PR Interval:  162 QRS Duration: 126 QT Interval:  442 QTC Calculation: 483 R Axis:   -55 Text Interpretation:  Normal sinus rhythm Left axis deviation Right bundle  branch block Abnormal ECG No previous ECGs available Confirmed by YAO  MD,  DAVID ([id number]) on 02/17/2016 9:30:06 PM      MDM   Final diagnoses:  Acute ischemic stroke [facility])    The pt is a 82 yo female presenting for stroke like sx over the last week.   On exam pt HDS in NAD.  Mild R facial droop.  CT head without acute findings and CBC, BMP, UA, and ECG unremarkable.  Suspicious  of stroke and neurology consulted who evaluated the pt in the ED.  Sx for 1 wk and outside tpa window.  Consulted hospitalist for admission.   Labs were viewed by myself and incorporated into medical decision making.  Discussed pertinent finding with patient or caregiver prior to admission with no further questions.  Pt care supervised by my attending Dr. Freddi Starr, MD PGY-3 Emergency Medicine     Tery Sanfilippo, MD 02/18/16 1126  Richardean Canal, MD 02/19/16 [phone]

## 2016-02-17 NOTE — Consult Note (Signed)
Neurology Consultation Reason for Consult: Right-sided weakness Referring Physician: Tery Sanfilippoiester, Matthew  CC: Right-sided weakness  History is obtained from: Patient, daughter  HPI: Laurena BeringDorothy Maloof is a 82 y.o. female with a history of dementia who has been having increased difficulty with speech and some difficulty with swallowing over the past week. Her daughter is also noticed some facial drooping. She states that she seems to be having difficulty with word finding as well, sometimes having difficulty getting her words out and this is atypical for her. She has also been having increased falls, having fallen twice in the past week.   LKW: Unclear, sometime in the past week tpa given?: no, outside of window    ROS: A 14 point ROS was performed and is negative except as noted in the HPI.   Past Medical History  Diagnosis Date  . Scoliosis   . Dementia   . COPD (chronic obstructive pulmonary disease) (HCC)   . Hypertension   . Dysphagia   . Depression     Family history: No history of stroke per daughter, patient thinks "someone" but is not sure who had a stroke  Social History:  reports that she has never smoked. She does not have any smokeless tobacco history on file. She reports that she does not drink alcohol or use illicit drugs.   Exam: Current vital signs: BP 127/73 mmHg  Pulse 68  Temp(Src) 97.7 F (36.5 C) (Oral)  Resp 12  SpO2 97% Vital signs in last 24 hours: Temp:  [97.7 F (36.5 C)-98 F (36.7 C)] 97.7 F (36.5 C) (07/07 2200) Pulse Rate:  [65-73] 68 (07/07 2245) Resp:  [12-20] 12 (07/07 2230) BP: (120-147)/(68-88) 127/73 mmHg (07/07 2230) SpO2:  [97 %-99 %] 97 % (07/07 2245)   Physical Exam  Constitutional: Appears well-developed and well-nourished.  Psych: Affect appropriate to situation Eyes: No scleral injection HENT: No OP obstrucion Head: Normocephalic.  Cardiovascular: Normal rate and regular rhythm.  Respiratory: Effort normal and breath  sounds normal to anterior ascultation GI: Soft.  No distension. There is no tenderness.  Skin: WDI  Neuro: Mental Status: Patient is awake, alert, oriented to person only him [place] and that it is January No signs of neglect. She has some difficulty following even moderately complex commands, and has some difficulty with word finding at times. Cranial Nerves: II: Visual Fields are full. Pupils are equal, round, and reactive to light.   III,IV, VI: I wonder if the right eye slightly outwardly deviated, but difficult to tell given extensive surgical change.(Denies diplopia) V: Facial sensation is decreased on the right VII: Facial movement is mildly weak on the right VIII: hearing is intact to voice X: Uvula elevates symmetrically XI: Shoulder shrug is symmetric. XII: tongue is midline without atrophy or fasciculations.  Motor: She has a positive orbital sign in the right arm, but to confrontation does pretty well, no clear drift. 5/5 elsewhere Sensory: She has decreased sensation in the right arm, intact in the leg Cerebellar: She does not perform finger-nose-finger despite multiple attempts to get her to   I have reviewed labs in epic and the results pertinent to this consultation are: Borderline creatinine  I have reviewed the images obtained: CT head no acute findings  Impression: 82 year old female with increased falls, right facial and arm numbness, mild right-sided face and arm weakness and possible mild expressive aphasia. I suspect that she has had a small ischemic infarct.   Recommendations: 1. HgbA1c, fasting  lipid panel 2. MRI, MRA  of the brain without contrast 3. Frequent neuro checks 4. Echocardiogram 5. Carotid dopplers 6. Prophylactic therapy-Antiplatelet med: Aspirin - dose 325mg  PO or 300mg  PR 7. Risk factor modification 8. Telemetry monitoring 9. PT consult, OT consult, Speech consult 10. please page stroke NP  Or  PA  Or MD  M-F from  8am -4 pm starting 7/8 as this patient will be followed by the stroke team at this point.   You can look them up on [email or web address]      Ritta SlotMcNeill Kirkpatrick, MD [facility] [phone]  If 7pm- 7am, please page neurology on call as listed in AMION.

## 2016-02-17 NOTE — ED Notes (Signed)
Pt has dementia, per family, she is at baseline mental status.

## 2016-02-17 NOTE — ED Notes (Signed)
Per pt daughter, c/o slurred speech for a couple of days. Pt family also states the L side of her face has been drooping. Pt has alzheimers and dementia, baseline is confused. Pt answered "june" for month and "[birth year]" for how old she was. Pt smiling and laughing, in NAD. Denies pain.

## 2016-02-17 NOTE — Progress Notes (Signed)
Pt admitted from ED with stroke like symptoms, alert but pleasantly confused, follows simple command, pt settled in bed with family and call light at bedside, was however reassured and will continue to monitor, v/s stable. Roberta Griffin, Roberta Griffin

## 2016-02-18 ENCOUNTER — Inpatient Hospital Stay (HOSPITAL_COMMUNITY): Payer: Medicare Other

## 2016-02-18 DIAGNOSIS — G9340 Encephalopathy, unspecified: Secondary | ICD-10-CM

## 2016-02-18 DIAGNOSIS — R1310 Dysphagia, unspecified: Secondary | ICD-10-CM

## 2016-02-18 DIAGNOSIS — R2990 Unspecified symptoms and signs involving the nervous system: Secondary | ICD-10-CM

## 2016-02-18 LAB — VITAMIN B12: Vitamin B-12: 353 pg/mL (ref 180–914)

## 2016-02-18 LAB — GLUCOSE, CAPILLARY: Glucose-Capillary: 118 mg/dL — ABNORMAL HIGH (ref 65–99)

## 2016-02-18 LAB — LIPID PANEL
Cholesterol: 214 mg/dL — ABNORMAL HIGH (ref 0–200)
HDL: 54 mg/dL (ref >40)
LDL Cholesterol: 142 mg/dL — ABNORMAL HIGH (ref 0–99)
Total CHOL/HDL Ratio: 4 RATIO
Triglycerides: 90 mg/dL (ref <150)
VLDL: 18 mg/dL (ref 0–40)

## 2016-02-18 LAB — CBC AND DIFFERENTIAL: HCT: 38 % (ref 36–46)

## 2016-02-18 LAB — TSH: TSH: 1.211 u[IU]/mL (ref 0.350–4.500)

## 2016-02-18 LAB — AMMONIA: Ammonia: 22 umol/L (ref 9–35)

## 2016-02-18 MED ORDER — LORAZEPAM 2 MG/ML IJ SOLN
1.0000 mg | Freq: Once | INTRAMUSCULAR | Status: AC
  Administered 2016-02-18: 1 mg via INTRAVENOUS
  Filled 2016-02-18: qty 1

## 2016-02-18 MED ORDER — SODIUM CHLORIDE 0.9 % IV SOLN
INTRAVENOUS | Status: DC
  Administered 2016-02-20 – 2016-02-22 (×5): via INTRAVENOUS
  Filled 2016-02-18 (×12): qty 1000

## 2016-02-18 NOTE — Progress Notes (Signed)
EEG Completed; Results Pending  

## 2016-02-18 NOTE — Progress Notes (Signed)
Patient with increased agitation swinging at staff attempting to get out of bed unassisted PRN Haldol not effective. Provider made aware.

## 2016-02-18 NOTE — Progress Notes (Signed)
Occupational Therapy Evaluation Patient Details Name: Roberta Griffin MRN: [id number] DOB: 06/15/1926 Today's Date: 02/18/2016    History of Present Illness 83 y.o. female admitted for R facial droop and RUE dysesthesias. MRI (-) for acute event, reveal chronic R MCA infarct. PMH significant for HTN, COPD, CKD, dementia, dysphagia, scoliosis, and depression.   Clinical Impression   Unsure of pt's PLOF of home information as no family present for OT evaluation. Pt presents with cognitive deficits, decreased safety awareness, generalized weakness, and balance deficits impacting pt's ability to complete ADLs and transfers safely. Recommend SNF for post-acute rehab as pt has had several falls recently and unsure of pt's daughter's ability to provide necessary level of assistance and supervision. Will continue to follow acutely.    Follow Up Recommendations  SNF;Supervision/Assistance - 24 hour    Equipment Recommendations  Other (comment) (TBD at next venue)    Recommendations for Other Services       Precautions / Restrictions Precautions Precautions: Fall Precaution Comments: has had several falls since moving in with daughter Restrictions Weight Bearing Restrictions: No      Mobility Bed Mobility Overal bed mobility: Needs Assistance Bed Mobility: Supine to Sit     Supine to sit: Min assist     General bed mobility comments: Light min assist to initiate moving to sit EOB.   Transfers Overall transfer level: Needs assistance Equipment used: 1 person hand held assist Transfers: Sit to/from Stand Sit to Stand: Mod assist         General transfer comment: Assist for balance upon standing. Pt required hand held assist and holding onto railing in hallway to ambulate safely, but refuses to use RW. Verbal directional cues, pt with tangential speech and becomes easily distracted resulting in worsening balance.    Balance Overall balance assessment: Needs assistance;History  of Falls Sitting-balance support: No upper extremity supported;Feet supported Sitting balance-Leahy Scale: Good     Standing balance support: Single extremity supported;During functional activity Standing balance-Leahy Scale: Poor Standing balance comment: reliant on at least single extremity support to maintain safe balance                            ADL Overall ADL's : Needs assistance/impaired Eating/Feeding: Set up;Sitting   Grooming: Wash/dry hands;Minimal assistance;Standing   Upper Body Bathing: Set up;Sitting   Lower Body Bathing: Minimal assistance;Sit to/from stand   Upper Body Dressing : Set up;Sitting   Lower Body Dressing: Minimal assistance;Sit to/from stand   Toilet Transfer: Moderate assistance;Cueing for safety;Ambulation;BSC   Toileting- Clothing Manipulation and Hygiene: Minimal assistance;Sit to/from stand       Functional mobility during ADLs: Moderate assistance General ADL Comments: Pt does not use RW at home per chart and declined to use one during session. Assist provided mainly for balance and cues for initiation and sequencing.     Vision Additional Comments: Difficult to assess due to cognitive deficits   Perception     Praxis      Pertinent Vitals/Pain Pain Assessment: No/denies pain     Hand Dominance     Extremity/Trunk Assessment Upper Extremity Assessment Upper Extremity Assessment: Generalized weakness   Lower Extremity Assessment Lower Extremity Assessment: Generalized weakness   Cervical / Trunk Assessment Cervical / Trunk Assessment: Kyphotic;Other exceptions Cervical / Trunk Exceptions: scoliosis   Communication Communication Communication: Expressive difficulties (Has difficulty finding words at times)   Cognition Arousal/Alertness: Awake/alert Behavior During Therapy: WFL for tasks assessed/performed Overall Cognitive Status:  History of cognitive impairments - at baseline       Memory: Decreased  short-term memory             General Comments       Exercises       Shoulder Instructions      Home Living Family/patient expects to be discharged to:: Private residence Living Arrangements: Children Available Help at Discharge: Family                             Additional Comments: No family present to obtain home information. Pt is a poor historian.      Prior Functioning/Environment          Comments: Unsure of PLOF, no family present to gather information. Pt is a poor historian    OT Diagnosis: Generalized weakness;Cognitive deficits   OT Problem List: Decreased strength;Decreased activity tolerance;Impaired balance (sitting and/or standing);Decreased coordination;Decreased cognition;Decreased safety awareness;Decreased knowledge of use of DME or AE   OT Treatment/Interventions:      OT Goals(Current goals can be found in the care plan section) Acute Rehab OT Goals Patient Stated Goal: none stated OT Goal Formulation: Patient unable to participate in goal setting Time For Goal Achievement: 03/03/16 Potential to Achieve Goals: Good ADL Goals Pt Will Perform Grooming: with supervision;standing Pt Will Perform Upper Body Bathing: with supervision;standing Pt Will Perform Lower Body Bathing: with supervision;sit to/from stand Pt Will Transfer to Toilet: with supervision;ambulating;bedside commode (over toilet) Pt Will Perform Toileting - Clothing Manipulation and hygiene: with supervision;sitting/lateral leans;sit to/from stand  OT Frequency:     Barriers to D/C:            Co-evaluation              End of Session Equipment Utilized During Treatment: Gait belt Nurse Communication: Mobility status  Activity Tolerance: Patient tolerated treatment well Patient left: in chair;with call bell/phone within reach;with nursing/sitter in room   Time: 1410-1422 OT Time Calculation (min): 12 min Charges:  OT General Charges $OT Visit: 1  Procedure OT Evaluation $OT Eval High Complexity: 1 Procedure G-Codes:    Roberta PyleJulia Griffin, OTR/L Pager: [phone]: [phone] 02/18/2016, 3:46 PM

## 2016-02-18 NOTE — Evaluation (Signed)
Clinical/Bedside Swallow Evaluation Patient Details  Name: Roberta Griffin MRN: [id number] Date of Birth: 12/15/1925  Today's Date: 02/18/2016 Time:    9:45-10:06    Past Medical History:  Past Medical History  Diagnosis Date  . Scoliosis   . Dementia   . COPD (chronic obstructive pulmonary disease) (HCC)   . Hypertension   . Dysphagia   . Depression    Past Surgical History:  Past Surgical History  Procedure Laterality Date  . Shoulder surgery    . Knee surgery    . Abdominal hysterectomy    . Vitrectomy and cataract     HPI: 82 y.o. female with a past medical history significant for dementia, HTN, and CKD who presents with abnormal speech for 3-4 days; MRI head 02/18/16 indicated No acute intracranial abnormality; CXR on 02/18/16 indicated Soft tissue density of the upper right mediastinal border. There is associated uplifting of the minor fissure. Findings are concerning for right upper lobe partial volume loss, and a central obstructing tumor is not excluded. Further evaluation with contrast-enhanced CT is recommended.     Assessment / Plan / Recommendation Clinical Impression   Pt exhibits mild oropharyngeal dysphagia characterized by impaired mastication with solids and mild decreased oral transit which improved with verbal cueing from SLP; no overt s/s of aspiration/penetration noted; recommend Dyphagia 3 (mechanical soft) diet with thin liquids; ST to f/u for diet tolerance while in hospital   Aspiration Risk    Mild aspiration risk d/t cognitive deficits   Diet Recommendation  Dysphagia 3 (mechanical soft)/thin liquids        Other  Recommendations     Follow up Recommendations    24 hr supervision   Frequency and Duration   2x/week for 1 week         Prognosis   Fair     Swallow Study   General      Oral/Motor/Sensory Function  Appears WFL; limited OME d/t cognition   Ice Chips   WFL  Thin Liquid   WFL   Nectar Thick   NT  Honey Thick   NT  Puree   WFL   Solid        decreased oral transit/impaired mastication       ADAMS,PAT, M.S., CCC-SLP 02/18/2016,12:36 PM

## 2016-02-18 NOTE — Procedures (Signed)
Electroencephalogram (EEG) Report  Date of study: 02/18/16  Requesting clinician: Marvel PlanJindong Xu, MD  Reason for study: Evaluate for seizure  Brief clinical history: This is an 82 year old woman who has a history of dementia now admitted for speech abnormalities for 3 or 4 days.  Medications:  Current facility-administered medications:  .  aspirin suppository 300 mg, 300 mg, Rectal, Daily **OR** aspirin tablet 325 mg, 325 mg, Oral, Daily, Alberteen Samhristopher P Danford, MD, 325 mg at 02/18/16 0959 .  enoxaparin (LOVENOX) injection 40 mg, 40 mg, Subcutaneous, Daily, Alberteen Samhristopher P Danford, MD, 40 mg at 02/18/16 1000 .  haloperidol lactate (HALDOL) injection 2 mg, 2 mg, Intravenous, QHS PRN, Alberteen Samhristopher P Danford, MD, 2 mg at 02/18/16 0116 .  labetalol (NORMODYNE,TRANDATE) injection 5 mg, 5 mg, Intravenous, Q8H PRN, Alberteen Samhristopher P Danford, MD  Description: This is a routine EEG performed using standard international 10-20 electrode placement. A total of 18 channels are recorded, including one for the EKG.  Activating Maneuvers: None  Findings:  The EKG channel is obscured by artifact and cannot be interpreted.   The background is mildly disorganized and consists of mildly low voltage theta activity.. The best dominant posterior rhythm is 6-7. This is symmetric and reacts as expected with eye opening.   There are no focal asymmetries. No epileptiform discharges are present. No seizures are recorded.   Drowsiness is recorded and is normal in appearance.  Impression: This is a mildly abnormal EEG due to mild diffuse generalized slowing. This appearance is nonspecific but could be consistent with a reported history of dementia. Otherwise, this could represent encephalopathy from any cause. Clinical correlation required.   Rhona Leavensimothy Oster, MD [facility]

## 2016-02-18 NOTE — Progress Notes (Signed)
PROGRESS NOTE  Roberta BeringDorothy Griffin YQM:[id number] DOB: 12/15/1925 DOA: 02/17/2016 PCP: No primary care provider on file.  Brief History:  82 year old female with a history of dementia, hypertension, CKD presents with 2-3 day history of increasing falls. Due to the patient's dementia, she has not provided any significant history. According to the patient's daughter, the patient developed increasing dysarthria and dysphasia on 02/17/2016. There was concern for right facial droop as well as right upper extremity dysesthesias. In emergency department, CT of the brain was negative. Neurology was consulted, and full stroke workup was undertaken.  Assessment/Plan: Acute encephalopathy -Suspect this may actually represent continued functional and cognitive decline due to the patient's underlying dementia -Serum B12 -TSH -RBC folate -RPR -Ammonia -Urinalysis negative for pyuria -Personally reviewed EKG--sinus rhythm, RBBB -Continue aspirin 81 mg daily  Dysarthria/dysphagia/dysesthesias -02/18/2016 MRI brain negative for acute infarct -MRA brain negative -Carotid duplex negative for hemodynamically significant stenosis -EEG negative for seizure--mild diffuse slowing -personally reviewedChest x-ray negative for infiltrates -LDL 142 -speech eval-->dys 3 diet -PT/OT  Dementia -Restart Aricept and Risperdal -Restart trazodone at bedtime  Hypertension -Restart metoprolol succinate  Renal insufficiency, unclear chronicity -start IVF with potassium -am bmp  Abnormal CXR -hydrate and CT chest with contrast if renal function improved   Disposition Plan:   SNF 7/9 or 7/10 Family Communication:   Daughter updated on 7/8   Consultants:  Neurology  Code Status: DNR  DVT Prophylaxis:  [place] Lovenox   Procedures: As Listed in Progress Note Above  Antibiotics: None    Subjective: Patient denies fevers, chills, headache, chest pain, dyspnea, nausea, vomiting, diarrhea,  abdominal pain.  Patient is pleasant and confused.   Objective: Filed Vitals:   02/18/16 0600 02/18/16 0807 02/18/16 0931 02/18/16 1244  BP: 151/79 149/110 144/71 149/77  Pulse: 86 83 83 86  Temp: 97.3 F (36.3 C) 98.1 F (36.7 C) 98.2 F (36.8 C) 98.9 F (37.2 C)  TempSrc: Oral Oral Oral Oral  Resp: 18 18 18 18   Height:      Weight:      SpO2: 100% 98% 98% 98%    Intake/Output Summary (Last 24 hours) at 02/18/16 1355 Last data filed at 02/18/16 1108  Gross per 24 hour  Intake      0 ml  Output   1350 ml  Net  -1350 ml   Weight change:  Exam:   General:  Pt is alert, follows commands appropriately, not in acute distress  HEENT: No icterus, No thrush, No neck mass, [place]/AT  Cardiovascular: RRR, S1/S2, no rubs, no gallops  Respiratory: CTA bilaterally, no wheezing, no crackles, no rhonchi  Abdomen: Soft/+BS, non tender, non distended, no guarding  Extremities: No edema, No lymphangitis, No petechiae, No rashes, no synovitis   Data Reviewed: I have personally reviewed following labs and imaging studies Basic Metabolic Panel:  Recent Labs Lab 02/17/16 1746 02/17/16 1818  NA 138 141  K 3.5 3.5  CL 109 106  CO2 22  --   GLUCOSE 93 92  BUN 14 16  CREATININE 1.19* 1.20*  CALCIUM 9.4  --    Liver Function Tests:  Recent Labs Lab 02/17/16 1746  AST 19  ALT 7*  ALKPHOS 46  BILITOT 0.3  PROT 6.4*  ALBUMIN 3.5   No results for input(s): LIPASE, AMYLASE in the last 168 hours. No results for input(s): AMMONIA in the last 168 hours. Coagulation Profile:  Recent Labs Lab 02/17/16 1746  INR  1.14   CBC:  Recent Labs Lab 02/17/16 1746 02/17/16 1818  WBC 7.0  --   NEUTROABS 4.8  --   HGB 11.9* 12.2  HCT 36.1 36.0  MCV 88.5  --   PLT 157  --    Cardiac Enzymes: No results for input(s): CKTOTAL, CKMB, CKMBINDEX, TROPONINI in the last 168 hours. BNP: Invalid input(s): POCBNP CBG:  Recent Labs Lab 02/17/16 2111  GLUCAP 108*   HbA1C: No  results for input(s): HGBA1C in the last 72 hours. Urine analysis:    Component Value Date/Time   COLORURINE YELLOW 02/17/2016 2115   APPEARANCEUR CLEAR 02/17/2016 2115   LABSPEC 1.019 02/17/2016 2115   PHURINE 5.5 02/17/2016 2115   GLUCOSEU NEGATIVE 02/17/2016 2115   HGBUR NEGATIVE 02/17/2016 2115   BILIRUBINUR NEGATIVE 02/17/2016 2115   KETONESUR NEGATIVE 02/17/2016 2115   PROTEINUR NEGATIVE 02/17/2016 2115   NITRITE NEGATIVE 02/17/2016 2115   LEUKOCYTESUR NEGATIVE 02/17/2016 2115   Sepsis Labs: (procalcitonin:4,lacticidven:4) )No results found for this or any previous visit (from the past 240 hour(s)).   Scheduled Meds: . aspirin  300 mg Rectal Daily   Or  . aspirin  325 mg Oral Daily  . enoxaparin (LOVENOX) injection  40 mg Subcutaneous Daily   Continuous Infusions: . sodium chloride 0.9 % 1,000 mL with potassium chloride 20 mEq infusion      Procedures/Studies: Ct Head Wo Contrast  02/17/2016  CLINICAL DATA:  Per pt daughter, c/o slurred speech for a couple of days. Pt family also states the L side of her face has been drooping. Pt has alzheimers and dementia, baseline is confused. Pt answered "27" for how old she was. Pt smiling. EXAM: CT HEAD WITHOUT CONTRAST TECHNIQUE: Contiguous axial images were obtained from the base of the skull through the vertex without intravenous contrast. COMPARISON:  None. FINDINGS: No acute intracranial hemorrhage. No focal mass lesion. No CT evidence of acute infarction. No midline shift or mass effect. No hydrocephalus. Basilar cisterns are patent. There are periventricular and subcortical white matter hypodensities. Generalized cortical atrophy. Paranasal sinuses and mastoid air cells are clear. Orbits are clear. IMPRESSION: 1. No acute intracranial findings. 2. Marked atrophy and moderate white matter microvascular disease. Electronically Signed   By: Genevive Bi M.D.   On: 02/17/2016 19:13   Mr Brain Wo Contrast  02/18/2016   CLINICAL DATA:  82 year old female with increasing difficulty with speech and swallowing for 1 week. Facial droop. Increased falls. Initial encounter. EXAM: MRI HEAD WITHOUT CONTRAST MRA HEAD WITHOUT CONTRAST TECHNIQUE: Multiplanar, multiecho pulse sequences of the brain and surrounding structures were obtained without intravenous contrast. Angiographic images of the head were obtained using MRA technique without contrast. COMPARISON:  Noncontrast head CT 02/17/2016 FINDINGS: MRI HEAD FINDINGS Metal susceptibility artifact related to a tiny retained metal foreign body in the scalp (series 3, image 28 of the comparison) along the left posterior convexity mildly degrades diffusion weighted imaging. No convincing restricted diffusion or evidence of acute infarction. Major intracranial vascular flow voids are preserved. Mild intracranial artery dolichoectasia. No midline shift, mass effect, ventriculomegaly, extra-axial collection or acute intracranial hemorrhage. Cervicomedullary junction and pituitary are within normal limits. Negative for age visualized cervical spine. Normal bone marrow signal. There is prominent focal hyperostosis of the right frontal bone versus a small calcified meningioma (series 14, image 22). Minimal mass effect on the adjacent right frontal lobe with no cerebral edema. Small to moderate area of cortical encephalomalacia along the right peri-Rolandic cortex best seen on series  14, image 12. No chronic cerebral blood products identified. Underlying confluent cerebral white matter T2 and FLAIR hyperintensity, in addition to patchy bilateral similar white matter changes. Deep gray matter nuclei, brainstem, and cerebellum are normal for age. Visible internal auditory structures appear normal. Mastoids are clear. No skullbase abnormality identified. Paranasal sinuses are clear. Postoperative changes to the bilateral globes. Negative scalp soft tissues. MRA HEAD FINDINGS Antegrade flow in the  posterior circulation with codominant distal vertebral arteries. Normal PICA origins and no distal vertebral stenosis. Patent vertebrobasilar junction and basilar artery without stenosis. Mild ectasia at the basilar tip. SCA and PCA origins are normal. Left posterior communicating artery is present, the right is diminutive or absent. Tortuous left P1 segment. Bilateral PCA branches are normal. Antegrade flow in both ICA siphons. No siphon stenosis. Normal ophthalmic artery origins. Normal MCA and ACA origins. Tortuous A1 segments. Anterior communicating artery and visualized ACA branches are within normal limits. Mild MCA tortuosity. M1 segments and visualized bilateral MCA branches otherwise are normal. IMPRESSION: 1.  No acute intracranial abnormality. 2. Chronic posterior right MCA infarct with superimposed bilateral white matter changes most suggestive of chronic small vessel disease. 3.  Negative intracranial MRA aside from mild arterial tortuosity. 4. Focal hyperostosis of the right frontal bone versus small right frontal meningioma, appears inconsequential. Electronically Signed   By: Odessa Fleming M.D.   On: 02/18/2016 08:19   Dg Chest Port 1 View  02/18/2016  CLINICAL DATA:  82 year old female with a history of difficulty speaking and falling EXAM: PORTABLE CHEST 1 VIEW COMPARISON:  None. FINDINGS: Cardiomediastinal silhouette enlarged. Calcifications of the aortic arch. No evidence of central vascular congestion. Soft tissue density projects over the upper right mediastinal border, crossing the contours of the clavicular head and upper posterior ribs. Uplifting of the minor fissure No pneumothorax or pleural effusion.  No confluent airspace disease. Surgical changes of prior bilateral reverse shoulder arthroplasty. No displaced fracture. IMPRESSION: No radiographic evidence of acute cardiopulmonary disease. Soft tissue density of the upper right mediastinal border. There is associated uplifting of the minor  fissure. Findings are concerning for right upper lobe partial volume loss, and a central obstructing tumor is not excluded. Further evaluation with contrast-enhanced CT is recommended. These results will be called to the ordering clinician or representative by the Radiologist Assistant, and communication documented in the PACS or zVision Dashboard. Cardiomegaly. Aortic atherosclerosis. Signed, Yvone Neu. Loreta Ave, DO [facility] Electronically Signed   By: Gilmer Mor D.O.   On: 02/18/2016 10:45   Mr Maxine Glenn Head/brain Wo Cm  02/18/2016  CLINICAL DATA:  82 year old female with increasing difficulty with speech and swallowing for 1 week. Facial droop. Increased falls. Initial encounter. EXAM: MRI HEAD WITHOUT CONTRAST MRA HEAD WITHOUT CONTRAST TECHNIQUE: Multiplanar, multiecho pulse sequences of the brain and surrounding structures were obtained without intravenous contrast. Angiographic images of the head were obtained using MRA technique without contrast. COMPARISON:  Noncontrast head CT 02/17/2016 FINDINGS: MRI HEAD FINDINGS Metal susceptibility artifact related to a tiny retained metal foreign body in the scalp (series 3, image 28 of the comparison) along the left posterior convexity mildly degrades diffusion weighted imaging. No convincing restricted diffusion or evidence of acute infarction. Major intracranial vascular flow voids are preserved. Mild intracranial artery dolichoectasia. No midline shift, mass effect, ventriculomegaly, extra-axial collection or acute intracranial hemorrhage. Cervicomedullary junction and pituitary are within normal limits. Negative for age visualized cervical spine. Normal bone marrow signal. There is prominent focal hyperostosis  of the right frontal bone versus a small calcified meningioma (series 14, image 22). Minimal mass effect on the adjacent right frontal lobe with no cerebral edema. Small to moderate area of cortical  encephalomalacia along the right peri-Rolandic cortex best seen on series 14, image 12. No chronic cerebral blood products identified. Underlying confluent cerebral white matter T2 and FLAIR hyperintensity, in addition to patchy bilateral similar white matter changes. Deep gray matter nuclei, brainstem, and cerebellum are normal for age. Visible internal auditory structures appear normal. Mastoids are clear. No skullbase abnormality identified. Paranasal sinuses are clear. Postoperative changes to the bilateral globes. Negative scalp soft tissues. MRA HEAD FINDINGS Antegrade flow in the posterior circulation with codominant distal vertebral arteries. Normal PICA origins and no distal vertebral stenosis. Patent vertebrobasilar junction and basilar artery without stenosis. Mild ectasia at the basilar tip. SCA and PCA origins are normal. Left posterior communicating artery is present, the right is diminutive or absent. Tortuous left P1 segment. Bilateral PCA branches are normal. Antegrade flow in both ICA siphons. No siphon stenosis. Normal ophthalmic artery origins. Normal MCA and ACA origins. Tortuous A1 segments. Anterior communicating artery and visualized ACA branches are within normal limits. Mild MCA tortuosity. M1 segments and visualized bilateral MCA branches otherwise are normal. IMPRESSION: 1.  No acute intracranial abnormality. 2. Chronic posterior right MCA infarct with superimposed bilateral white matter changes most suggestive of chronic small vessel disease. 3.  Negative intracranial MRA aside from mild arterial tortuosity. 4. Focal hyperostosis of the right frontal bone versus small right frontal meningioma, appears inconsequential. Electronically Signed   By: Odessa Fleming M.D.   On: 02/18/2016 08:19    TAT, DAVID, DO  [facility] Pager [phone]  If 7PM-7AM, please contact night-coverage [email or web address] Password [email or web address] 02/18/2016, 1:55 PM   LOS: 1 day

## 2016-02-18 NOTE — Progress Notes (Signed)
VASCULAR LAB PRELIMINARY  PRELIMINARY  PRELIMINARY  PRELIMINARY  Carotid duplex completed.    Preliminary report:  1-39% ICA plaquing.  Vertebral artery flow is antegrade.   KANADY, CANDACE, RVT 02/18/2016, 12:31 PM

## 2016-02-18 NOTE — Progress Notes (Signed)
STROKE TEAM PROGRESS NOTE   HISTORY OF PRESENT ILLNESS (per record) Roberta Griffin is a 82 y.o. female with a history of dementia who has been having increased difficulty with speech and some difficulty with swallowing over the past week. Her daughter is also noticed some facial drooping. She states that she seems to be having difficulty with word finding as well, sometimes having difficulty getting her words out and this is atypical for her. She has also been having increased falls, having fallen twice in the past week.   LKW: Unclear, sometime in the past week tpa given?: no, outside of window   SUBJECTIVE (INTERVAL HISTORY) Her sitter is at the bedside.  Overall she feels her condition is stable. She is pleasant but confused. Does not feel delirium but more dementia.    OBJECTIVE Temp:  [97.3 F (36.3 C)-98 F (36.7 C)] 97.3 F (36.3 C) (07/08 0600) Pulse Rate:  [65-86] 86 (07/08 0600) Cardiac Rhythm:  [-] Normal sinus rhythm (07/07 2326) Resp:  [12-20] 18 (07/08 0600) BP: (120-151)/(48-88) 151/79 mmHg (07/08 0600) SpO2:  [97 %-100 %] 100 % (07/08 0600) Weight:  [51.393 kg (113 lb 4.8 oz)] 51.393 kg (113 lb 4.8 oz) (07/08 0451)  CBC:  Recent Labs Lab 02/17/16 1746 02/17/16 1818  WBC 7.0  --   NEUTROABS 4.8  --   HGB 11.9* 12.2  HCT 36.1 36.0  MCV 88.5  --   PLT 157  --     Basic Metabolic Panel:  Recent Labs Lab 02/17/16 1746 02/17/16 1818  NA 138 141  K 3.5 3.5  CL 109 106  CO2 22  --   GLUCOSE 93 92  BUN 14 16  CREATININE 1.19* 1.20*  CALCIUM 9.4  --     Lipid Panel: No results found for: CHOL, TRIG, HDL, CHOLHDL, VLDL, LDLCALC HgbA1c: No results found for: HGBA1C Urine Drug Screen: No results found for: LABOPIA, COCAINSCRNUR, LABBENZ, AMPHETMU, THCU, LABBARB    IMAGING I have personally reviewed the radiological images below and agree with the radiology interpretations. Blue text is my interpretation.  Ct Head Wo Contrast 02/17/2016   1. No acute  intracranial findings.  2. Marked atrophy and moderate white matter microvascular disease.   MRI / MRA Head 02/17/2016 1. No acute intracranial abnormality. 2. Chronic posterior right MCA infarct with superimposed bilateral white matter changes most suggestive of chronic small vessel disease. 3. Negative intracranial MRA aside from mild arterial tortuosity. Mild to moderate left MCA stenosis 4. Focal hyperostosis of the right frontal bone versus small right frontal meningioma, appears inconsequential.   CUS - pending  TTE - pending  EEG - pending   PHYSICAL EXAM  Temp:  [97.3 F (36.3 C)-98.2 F (36.8 C)] 98.2 F (36.8 C) (07/08 0931) Pulse Rate:  [65-86] 83 (07/08 0931) Resp:  [12-20] 18 (07/08 0931) BP: (120-151)/(48-110) 144/71 mmHg (07/08 0931) SpO2:  [97 %-100 %] 98 % (07/08 0931) Weight:  [113 lb 4.8 oz (51.393 kg)] 113 lb 4.8 oz (51.393 kg) (07/08 0451)  General - Well nourished, well developed, in no apparent distress, pleasantly confused.  Ophthalmologic - Fundi not visualized due to noncooperation.  Cardiovascular - Regular rate and rhythm.  Mental Status -  Awake alert, but pleasantly confused, not orientated to place, time, or people, but orientated to self and age. Language fluent but content disorganized with confusion and changing topics.  Able to follow simple commands but not two step commands, able to repeat but naming 3/5, not able  to name parts of object.  Fund of Knowledge was assessed and was impaired.  Cranial Nerves II - XII - II - Visual field intact OU. III, IV, VI - Extraocular movements intact. V - Facial sensation intact bilaterally. VII - mild left nasolabial fold flattening. VIII - Hearing & vestibular intact bilaterally. X - Palate elevates symmetrically. XI - Chin turning & shoulder shrug intact bilaterally. XII - Tongue protrusion intact.  Motor Strength - The patient's strength was normal in all extremities and pronator drift was  absent.  Bulk was normal and fasciculations were absent.   Motor Tone - Muscle tone was assessed at the neck and appendages and was normal.  Reflexes - The patient's reflexes were 1+ in all extremities and she had no pathological reflexes.  Sensory - Light touch, temperature/pinprick were assessed and were symmetrical.    Coordination - The patient had normal movements in the hands with no ataxia or dysmetria.  Tremor was absent.  Gait and Station - deferred due to safety concerns.   ASSESSMENT/PLAN [name] is a 82 y.o. female with history of dementia, COPD, hypertension, and dysphagia presenting with worsening speech difficulties, swallowing difficulties, facial drooping, and falls x 2 for the last one week. She did not receive IV t-PA due to late presentation.  ? Encephalopathy - more consistent with dementia, less likely delirium.   MRI  - No acute intracranial abnormality. Chronic posterior right MCA infarct   MRA  - left MCA mild to moderate stenosis  Carotid Doppler - pending  2D Echo - pending  EEG - pending  UTI negative and culture pending  Will check CXR to rule out infection  LDL - 142  HgbA1c pending  VTE prophylaxis -  Lovenox  Diet NPO time specified  No antithrombotic prior to admission, now on aspirin 300 mg suppository daily. Change to ASA po once pass swallow  Patient counseled to be compliant with her antithrombotic medications  Ongoing aggressive stroke risk factor management  Therapy recommendations: Pending  Disposition: Pending  Dementia  On aricept at home  Resume aricept once passed swallow  Pleasantly confused  Hypertension  Stable  BP goal normotensive  Dysphagia   Chronic  Speech on board  Resume home meds after passing swallow  Hyperlipidemia  Home meds:  No lipid lowering medications prior to admission.  LDL 148, goal < 70  Add statin when swallowing has been cleared  Continue statin at  discharge  Other Stroke Risk Factors  Advanced age  Hx stroke/TIA - ? remote right small right MCA infarct by MRI   Other Active Problems  Mildly elevated creatinine 1.20  Hospital day # 1  Marvel PlanJindong Xu, MD PhD Stroke Neurology 02/18/2016 10:45 AM      To contact Stroke Continuity provider, please refer to [email or web address]. After hours, contact General Neurology

## 2016-02-18 NOTE — Progress Notes (Signed)
Physical Therapy Evaluation & Discharge Patient Details Name: Roberta Griffin MRN: [id number] DOB: 1925-11-14 Today's Date: 02/18/2016   History of Present Illness  82 y.o. female admitted for R facial droop and RUE dysesthesias. MRI (-) for acute event, reveal chronic R MCA infarct. PMH significant for HTN, COPD, CKD, dementia, dysphagia, scoliosis, and depression.  Clinical Impression  Patient seen with daughter present, reports noting slurred speech and facial droop, but other mobility and function appears to be at baseline.  Patient limited by dementia, daughter seeking long term care solution for her mother due to burden of care and her own new onset medical status.  Patient requires MIN assist overall for mobility, strength is roughly symmetric bilaterally, favors leaning to right side, from long standing orthopedic issue, high fall risk due to decreased safety.  Patient will benefit from skilled PT services following hospital discharge for safety and balance, but no skilled PT need noted acutely.  Will DC PT consult at this time.    Follow Up Recommendations SNF;Supervision/Assistance - 24 hour    Equipment Recommendations  None recommended by PT    Recommendations for Other Services       Precautions / Restrictions Precautions Precautions: Fall Precaution Comments: has had several falls since moving in with daughter Restrictions Weight Bearing Restrictions: No      Mobility  Bed Mobility Overal bed mobility: Needs Assistance Bed Mobility: Supine to Sit     Supine to sit: Min assist     General bed mobility comments: Light min assist to initiate moving to sit EOB.   Transfers Overall transfer level: Needs assistance Equipment used: 1 person hand held assist;Rolling walker (2 wheeled) Transfers: Sit to/from Stand Sit to Stand: Min assist;Mod assist         General transfer comment: Unsteady on feet, required hand held assist, later agreed to use 'buggy' (walker),  but does not use at home per daughter.  Ambulation/Gait Ambulation/Gait assistance: Min assist Ambulation Distance (Feet): 200 Feet Assistive device: 1 person hand held assist;Rolling walker (2 wheeled) Gait Pattern/deviations: Step-through pattern;Decreased weight shift to right;Trunk flexed;Narrow base of support     General Gait Details: unsteady on feet, needs MIN assist  Stairs            Wheelchair Mobility    Modified Rankin (Stroke Patients Only) Modified Rankin (Stroke Patients Only) Pre-Morbid Rankin Score: Moderately severe disability Modified Rankin: Moderately severe disability     Balance Overall balance assessment: Needs assistance Sitting-balance support: No upper extremity supported;Feet supported Sitting balance-Leahy Scale: Good     Standing balance support: Single extremity supported;During functional activity Standing balance-Leahy Scale: Poor Standing balance comment: reliant on at least single extremity support to maintain safe balance                             Pertinent Vitals/Pain Pain Assessment: No/denies pain    Home Living Family/patient expects to be discharged to:: Unsure (Long term care) Living Arrangements: Children Available Help at Discharge: Family             Additional Comments: Daughter reports she is seeking Long term care for her mother, due to new medical needs of her own.    Prior Function Level of Independence: Needs assistance   Gait / Transfers Assistance Needed: Supervision assist, needs walker but refuses to use  ADL's / Homemaking Assistance Needed: Daughter has been assisting.  Comments: Patient with multiple falls recently, fall history per  daughter.     Hand Dominance        Extremity/Trunk Assessment   Upper Extremity Assessment: Defer to OT evaluation;Generalized weakness           Lower Extremity Assessment: Generalized weakness      Cervical / Trunk Assessment:  Kyphotic;Other exceptions  Communication   Communication: Expressive difficulties (Has difficulty finding words at times)  Cognition Arousal/Alertness: Awake/alert Behavior During Therapy: WFL for tasks assessed/performed Overall Cognitive Status: History of cognitive impairments - at baseline       Memory: Decreased short-term memory              General Comments General comments (skin integrity, edema, etc.): Limitation by dementia.  Daughter reports speech affected, mobility and balance are at baseline.    Exercises        Assessment/Plan    PT Assessment All further PT needs can be met in the next venue of care  PT Diagnosis Difficulty walking;Altered mental status   PT Problem List Decreased strength;Decreased balance;Decreased mobility;Decreased coordination;Decreased cognition;Decreased safety awareness  PT Treatment Interventions     PT Goals (Current goals can be found in the Care Plan section) Acute Rehab PT Goals Patient Stated Goal: Per daughter, to find long term care PT Goal Formulation: With patient/family    Frequency     Barriers to discharge        Co-evaluation               End of Session Equipment Utilized During Treatment: Gait belt Activity Tolerance: Patient tolerated treatment well Patient left: in chair;with call bell/phone within reach;with nursing/sitter in room;with family/visitor present Nurse Communication: Mobility status         Time: 1520-1545 PT Time Calculation (min) (ACUTE ONLY): 25 min   Charges:   PT Evaluation $PT Eval Low Complexity: 1 Procedure PT Treatments $Gait Training: 8-22 mins   PT G CodesZenia Resides, Ty L 13-Mar-2016, 5:18 PM

## 2016-02-19 ENCOUNTER — Inpatient Hospital Stay (HOSPITAL_COMMUNITY): Payer: Medicare Other

## 2016-02-19 ENCOUNTER — Encounter (HOSPITAL_COMMUNITY): Payer: Self-pay | Admitting: Radiology

## 2016-02-19 DIAGNOSIS — I6789 Other cerebrovascular disease: Secondary | ICD-10-CM

## 2016-02-19 DIAGNOSIS — E785 Hyperlipidemia, unspecified: Secondary | ICD-10-CM | POA: Insufficient documentation

## 2016-02-19 DIAGNOSIS — R9389 Abnormal findings on diagnostic imaging of other specified body structures: Secondary | ICD-10-CM | POA: Insufficient documentation

## 2016-02-19 DIAGNOSIS — F0390 Unspecified dementia without behavioral disturbance: Secondary | ICD-10-CM

## 2016-02-19 LAB — BASIC METABOLIC PANEL
Anion gap: 9 (ref 5–15)
BUN: 10 mg/dL (ref 4–21)
BUN: 10 mg/dL (ref 6–20)
CO2: 25 mmol/L (ref 22–32)
Calcium: 9.5 mg/dL (ref 8.9–10.3)
Chloride: 106 mmol/L (ref 101–111)
Creatinine, Ser: 1.06 mg/dL — ABNORMAL HIGH (ref 0.44–1.00)
Creatinine: 1.1 mg/dL (ref 0.5–1.1)
GFR calc Af Amer: 53 mL/min — ABNORMAL LOW (ref >60)
GFR calc non Af Amer: 45 mL/min — ABNORMAL LOW (ref >60)
Glucose, Bld: 112 mg/dL — ABNORMAL HIGH (ref 65–99)
Glucose: 112 mg/dL
Potassium: 3 mmol/L — AB (ref 3.4–5.3)
Potassium: 3 mmol/L — ABNORMAL LOW (ref 3.5–5.1)
Sodium: 140 mmol/L (ref 135–145)
Sodium: 140 mmol/L (ref 137–147)

## 2016-02-19 LAB — URINE CULTURE

## 2016-02-19 LAB — ECHOCARDIOGRAM COMPLETE
Height: 63 in
Weight: 1812.8 oz

## 2016-02-19 LAB — HIV ANTIBODY (ROUTINE TESTING W REFLEX): HIV Screen 4th Generation wRfx: NONREACTIVE

## 2016-02-19 LAB — RPR: RPR Ser Ql: NONREACTIVE

## 2016-02-19 MED ORDER — IOPAMIDOL (ISOVUE-300) INJECTION 61%
INTRAVENOUS | Status: AC
  Filled 2016-02-19: qty 75

## 2016-02-19 MED ORDER — HALOPERIDOL LACTATE 5 MG/ML IJ SOLN: 5.0000 mg | Freq: Every evening | INTRAMUSCULAR | Status: DC | PRN

## 2016-02-19 MED ORDER — METOPROLOL SUCCINATE ER 25 MG PO TB24
50.0000 mg | ORAL_TABLET | Freq: Every day | ORAL | Status: DC
  Administered 2016-02-19 – 2016-02-23 (×5): 50 mg via ORAL
  Filled 2016-02-19 (×5): qty 2

## 2016-02-19 MED ORDER — TRAZODONE HCL 50 MG PO TABS
50.0000 mg | ORAL_TABLET | Freq: Every day | ORAL | Status: DC
  Administered 2016-02-19 – 2016-02-22 (×3): 50 mg via ORAL
  Filled 2016-02-19 (×3): qty 1

## 2016-02-19 MED ORDER — POTASSIUM CHLORIDE CRYS ER 20 MEQ PO TBCR
40.0000 meq | EXTENDED_RELEASE_TABLET | Freq: Four times a day (QID) | ORAL | Status: AC
  Administered 2016-02-19 (×2): 40 meq via ORAL
  Filled 2016-02-19 (×2): qty 2

## 2016-02-19 MED ORDER — PRAVASTATIN SODIUM 20 MG PO TABS: 20.0000 mg | ORAL_TABLET | Freq: Every day | ORAL | Status: DC

## 2016-02-19 MED ORDER — RISPERIDONE 0.5 MG PO TABS
1.0000 mg | ORAL_TABLET | Freq: Two times a day (BID) | ORAL | Status: DC
  Administered 2016-02-19 – 2016-02-23 (×8): 1 mg via ORAL
  Filled 2016-02-19 (×8): qty 2

## 2016-02-19 MED ORDER — HALOPERIDOL LACTATE 5 MG/ML IJ SOLN
5.0000 mg | Freq: Four times a day (QID) | INTRAMUSCULAR | Status: DC | PRN
  Administered 2016-02-20 – 2016-02-21 (×2): 5 mg via INTRAVENOUS
  Filled 2016-02-19 (×2): qty 1

## 2016-02-19 MED ORDER — ASPIRIN EC 81 MG PO TBEC: 81.0000 mg | DELAYED_RELEASE_TABLET | Freq: Every day | ORAL | Status: DC

## 2016-02-19 MED ORDER — DONEPEZIL HCL 10 MG PO TABS
10.0000 mg | ORAL_TABLET | Freq: Every day | ORAL | Status: DC
  Administered 2016-02-19 – 2016-02-22 (×3): 10 mg via ORAL
  Filled 2016-02-19 (×3): qty 1

## 2016-02-19 MED ORDER — PRAVASTATIN SODIUM 20 MG PO TABS
20.0000 mg | ORAL_TABLET | Freq: Every day | ORAL | Status: DC
  Administered 2016-02-20 – 2016-02-22 (×3): 20 mg via ORAL
  Filled 2016-02-19 (×3): qty 1

## 2016-02-19 MED ORDER — IOPAMIDOL (ISOVUE-300) INJECTION 61%: 80.0000 mL | Freq: Once | INTRAVENOUS | Status: DC | PRN

## 2016-02-19 MED ORDER — TRAZODONE HCL 50 MG PO TABS: 50.0000 mg | ORAL_TABLET | Freq: Every day | ORAL | Status: DC

## 2016-02-19 NOTE — Progress Notes (Signed)
  Echocardiogram 2D Echocardiogram has been performed.  Leta JunglingCooper, Tiffany M 02/19/2016, 9:42 AM

## 2016-02-19 NOTE — Clinical Social Work Note (Signed)
Clinical Social Work Assessment  Patient Details  Name: Roberta Griffin MRN: [id number] Date of Birth: Apr 05, 1926  Date of referral:  02/19/16               Reason for consult:  Facility Placement, Discharge Planning                Permission sought to share information with:  Family Supports, Customer service manager, Case Manager Permission granted to share information::  Yes, Verbal Permission Granted  Name::      Roberta Griffin or Psychologist, clinical)  Agency::   (SNF's )  Relationship::   (Daughter and Water engineer )  Sport and exercise psychologist Information:   [phone] or [phone])  Housing/Transportation Living arrangements for the past 2 months:  [place] of Information:  Adult Children Patient Interpreter Needed:  None Criminal Activity/Legal Involvement Pertinent to Current Situation/Hospitalization:  No - Comment as needed Significant Relationships:  Adult Children, Other Family Members Lives with:  Adult Children Do you feel safe going back to the place where you live?  No Need for family participation in patient care:  No (Coment)  Care giving concerns:  PT recommending SNF placement for ST rehab. Dtr, Roberta Griffin prefers [facility] and Rehab.    Social Worker assessment / plan:  Holiday representative met with patient's grandson, Roberta Griffin at bedside in reference to post-acute placement for SNF. CSW introduced CSW role and SNF process. SNF list reviewed and provided at bedside. Patient asleep during assessment. Patient's family prefers [facility] for ST rehab and overall LTC placement. Patient recently moved from [facility] 2 weeks ago. Dtr recently diagnosed with cancer and will start chemo treatment on Monday, 7/10. Pt's dtr, Roberta Griffin has requested that CSW and staff contact pt's granddtr, Roberta Griffin in the event they are not able to reach her on Monday. CSW discussed Air cabin crew and requirement for sitter to be d/c'ed 24 hours before d/c. Pt's dtr  expressed understanding. Dtr has contacted Education officer, museum at [employer] on Friday to discuss LTC options. Dtr is aware that she will need to start Medicaid application with facility. No further concerns reported at this time. CSW will continue to follow pt and pt's family for continued support and to facilitate d/c needs once medically stable.   Employment status:  Retired Forensic scientist:  Medicare PT Recommendations:  [place] / Referral to community resources:  [place]  Patient/Family's Response to care:  Pt disoriented. Family agreeable to SNF and prefers [facility] for [facility]. Pt's family strongly involved in care, supportive, pleasant and appreciated social work intervention.  Patient/Family's Understanding of and Emotional Response to Diagnosis, Current Treatment, and Prognosis:  Patient's family knowledgeable of medical interventions and continued medical work up.   Emotional Assessment Appearance:  Appears stated age Attitude/Demeanor/Rapport:  Unable to Assess Affect (typically observed):  Unable to Assess Orientation:  Oriented to Self Alcohol / Substance use:  Never Used Psych involvement (Current and /or in the community):  No (Comment)  Discharge Needs  Concerns to be addressed:  Care Coordination Readmission within the last 30 days:  No Current discharge risk:  Dependent with Mobility Barriers to Discharge:  Continued Medical Work up   [employer], MSW, LCSWA [phone] 02/19/2016 3:00 PM

## 2016-02-19 NOTE — Clinical Social Work Placement (Signed)
   CLINICAL SOCIAL WORK PLACEMENT  NOTE  Date:  02/19/2016  Patient Details  Name: Laurena BeringDorothy Carrigg MRN: [id number] Date of Birth: 02/25/1926  Clinical Social Work is seeking post-discharge placement for this patient at the Skilled  Nursing Facility level of care (*CSW will initial, date and re-position this form in  chart as items are completed):  Yes   Patient/family provided with [facility] Clinical Social Work Department's list of facilities offering this level of care within the geographic area requested by the patient (or if unable, by the patient's family).  Yes   Patient/family informed of their freedom to choose among providers that offer the needed level of care, that participate in Medicare, Medicaid or managed care program needed by the patient, have an available bed and are willing to accept the patient.  Yes   Patient/family informed of [facility]'s ownership interest in [facility] and [facility], as well as of the fact that they are under no obligation to receive care at these facilities.  PASRR submitted to EDS on 02/19/16     PASRR number received on 02/19/16     Existing PASRR number confirmed on       FL2 transmitted to all facilities in geographic area requested by pt/family on 02/19/16     FL2 transmitted to all facilities within larger geographic area on       Patient informed that his/her managed care company has contracts with or will negotiate with certain facilities, including the following:            Patient/family informed of bed offers received.  Patient chooses bed at       Physician recommends and patient chooses bed at      Patient to be transferred to   on  .  Patient to be transferred to facility by       Patient family notified on   of transfer.  Name of family member notified:        PHYSICIAN Please sign FL2     Additional Comment:    _______________________________________________ Vaughan BrownerNixon, Bashira A, LCSW 02/19/2016, 2:51  PM

## 2016-02-19 NOTE — NC FL2 (Signed)
[place] MEDICAID FL2 LEVEL OF CARE SCREENING TOOL     IDENTIFICATION  Patient Name: Roberta Griffin Birthdate: 02/06/1926 Sex: female Admission Date (Current Location): 02/17/2016  [facility] [place] [place]   Facility and Address:  [facility] [place], [address], [place], [place] [address]      Provider Number: [id number]  Attending Physician Name and Address:  Catarina Hartshornavid Tat, MD  Relative Name and Phone Number:       Current Level of Care: Hospital Recommended Level of Care: Skilled Nursing Facility Prior Approval Number:    Date Approved/Denied:   PASRR Number: [id number]  Discharge Plan: SNF    Current Diagnoses: Patient Active Problem List   Diagnosis Date Noted  . Acute encephalopathy 02/18/2016  . Dysphagia 02/18/2016  . Acute ischemic stroke (HCC) 02/17/2016  . COPD (chronic obstructive pulmonary disease) (HCC) 02/17/2016  . Dementia 02/17/2016  . Essential hypertension 02/17/2016  . Normocytic anemia 02/17/2016  . CKD (chronic kidney disease), stage III 02/17/2016    Orientation RESPIRATION BLADDER Height & Weight     Self  Normal Continent Weight: 51.393 kg (113 lb 4.8 oz) Height:  5\' 3"  (160 cm)  BEHAVIORAL SYMPTOMS/MOOD NEUROLOGICAL BOWEL NUTRITION STATUS   (NONE)  (NONE) Continent Diet (DYS 3)  AMBULATORY STATUS COMMUNICATION OF NEEDS Skin   Limited Assist Verbally Normal                       Personal Care Assistance Level of Assistance  Bathing, Feeding, Dressing Bathing Assistance: Limited assistance Feeding assistance: Independent Dressing Assistance: Limited assistance     Functional Limitations Info  Sight, Hearing, Speech Sight Info: Adequate Hearing Info: Adequate Speech Info: Adequate    SPECIAL CARE FACTORS FREQUENCY  PT (By licensed PT), OT (By licensed OT), Speech therapy     PT Frequency: 5/week OT Frequency: 5/week     Speech Therapy Frequency: 5/week      Contractures  Contractures Info: Not present    Additional Factors Info  Code Status, Allergies, Psychotropic Code Status Info: DNR Allergies Info: NKDA Psychotropic Info: Aricept, risperdal         Current Medications (02/19/2016):  This is the current hospital active medication list Current Facility-Administered Medications  Medication Dose Route Frequency Provider Last Rate Last Dose  . aspirin suppository 300 mg  300 mg Rectal Daily Alberteen Samhristopher P Danford, MD       Or  . aspirin tablet 325 mg  325 mg Oral Daily Alberteen Samhristopher P Danford, MD   325 mg at 02/19/16 0957  . donepezil (ARICEPT) tablet 10 mg  10 mg Oral QHS David Tat, MD      . enoxaparin (LOVENOX) injection 40 mg  40 mg Subcutaneous Daily Alberteen Samhristopher P Danford, MD   40 mg at 02/19/16 0957  . haloperidol lactate (HALDOL) injection 5 mg  5 mg Intravenous Q6H PRN Catarina Hartshornavid Tat, MD      . labetalol (NORMODYNE,TRANDATE) injection 5 mg  5 mg Intravenous Q8H PRN Alberteen Samhristopher P Danford, MD      . metoprolol succinate (TOPROL-XL) 24 hr tablet 50 mg  50 mg Oral Daily Onalee Huaavid Tat, MD      . potassium chloride SA (K-DUR,KLOR-CON) CR tablet 40 mEq  40 mEq Oral Q6H David Tat, MD      . risperiDONE (RISPERDAL) tablet 1 mg  1 mg Oral BID Onalee Huaavid Tat, MD      . sodium chloride 0.9 % 1,000 mL with potassium  chloride 20 mEq infusion   Intravenous Continuous Catarina Hartshorn, MD      . traZODone (DESYREL) tablet 50 mg  50 mg Oral QHS Catarina Hartshorn, MD         Discharge Medications: Please see discharge summary for a list of discharge medications.  Relevant Imaging Results:  Relevant Lab Results:   Additional Information SSN: [id number]  Venita Lick, LCSW

## 2016-02-19 NOTE — Progress Notes (Signed)
STROKE TEAM PROGRESS NOTE   HISTORY OF PRESENT ILLNESS (per record) Laurena BeringDorothy Yawn is a 82 y.o. female with a history of dementia who has been having increased difficulty with speech and some difficulty with swallowing over the past week. Her daughter is also noticed some facial drooping. She states that she seems to be having difficulty with word finding as well, sometimes having difficulty getting her words out and this is atypical for her. She has also been having increased falls, having fallen twice in the past week.   LKW: Unclear, sometime in the past week tpa given?: no, outside of window   SUBJECTIVE (INTERVAL HISTORY) No family is at the bedside.  Overall she feels her condition is stable. She is sleeping but easily arousable and pleasantly confused.    OBJECTIVE Temp:  [97.8 F (36.6 C)-98.5 F (36.9 C)] 97.8 F (36.6 C) (07/09 1401) Pulse Rate:  [62-96] 62 (07/09 1401) Cardiac Rhythm:  [-] Normal sinus rhythm;Bundle branch block (07/09 0700) Resp:  [18] 18 (07/09 1401) BP: (138-153)/(82-102) 152/102 mmHg (07/09 1401) SpO2:  [98 %-100 %] 100 % (07/09 1401)  CBC:   Recent Labs Lab 02/17/16 1746 02/17/16 1818  WBC 7.0  --   NEUTROABS 4.8  --   HGB 11.9* 12.2  HCT 36.1 36.0  MCV 88.5  --   PLT 157  --     Basic Metabolic Panel:   Recent Labs Lab 02/17/16 1746 02/17/16 1818 02/19/16 0316  NA 138 141 140  K 3.5 3.5 3.0*  CL 109 106 106  CO2 22  --  25  GLUCOSE 93 92 112*  BUN 14 16 10   CREATININE 1.19* 1.20* 1.06*  CALCIUM 9.4  --  9.5    Lipid Panel:     Component Value Date/Time   CHOL 214* 02/18/2016 0651   TRIG 90 02/18/2016 0651   HDL 54 02/18/2016 0651   CHOLHDL 4.0 02/18/2016 0651   VLDL 18 02/18/2016 0651   LDLCALC 142* 02/18/2016 0651   HgbA1c: No results found for: HGBA1C Urine Drug Screen: No results found for: LABOPIA, COCAINSCRNUR, LABBENZ, AMPHETMU, THCU, LABBARB    IMAGING I have personally reviewed the radiological images  below and agree with the radiology interpretations. Blue text is my interpretation.  Ct Head Wo Contrast 02/17/2016   1. No acute intracranial findings.  2. Marked atrophy and moderate white matter microvascular disease.   MRI / MRA Head 02/17/2016 1. No acute intracranial abnormality. 2. Chronic posterior right MCA infarct with superimposed bilateral white matter changes most suggestive of chronic small vessel disease. 3. Negative intracranial MRA aside from mild arterial tortuosity. Mild to moderate left MCA stenosis 4. Focal hyperostosis of the right frontal bone versus small right frontal meningioma, appears inconsequential.   CUS - Bilateral: 1-39% ICA stenosis. Vertebral artery flow is antegrade.  TTE - - Left ventricle: The cavity size was normal. Systolic function was  normal. The estimated ejection fraction was in the range of 55%  to 60%. Wall motion was normal; there were no regional wall  motion abnormalities. Doppler parameters are consistent with  abnormal left ventricular relaxation (grade 1 diastolic  dysfunction). - Tricuspid valve: There was moderate regurgitation directed  centrally. - Pulmonary arteries: PA peak pressure: 33 mm Hg (S). - Pericardium, extracardiac: A small to moderate, free-flowing  pericardial effusion was identified circumferential to the heart.  There was no evidence of hemodynamic compromise.  EEG - This is a mildly abnormal EEG due to mild diffuse generalized  slowing. This appearance is nonspecific but could be consistent with a reported history of dementia. Otherwise, this could represent encephalopathy from any cause. Clinical correlation required.   PHYSICAL EXAM  Temp:  [97.8 F (36.6 C)-98.5 F (36.9 C)] 97.8 F (36.6 C) (07/09 1401) Pulse Rate:  [62-96] 62 (07/09 1401) Resp:  [18] 18 (07/09 1401) BP: (138-153)/(82-102) 152/102 mmHg (07/09 1401) SpO2:  [98 %-100 %] 100 % (07/09 1401)  General - Well nourished, well  developed, in no apparent distress, pleasantly confused.  Ophthalmologic - Fundi not visualized due to noncooperation.  Cardiovascular - Regular rate and rhythm.  Mental Status -  Awake alert, but pleasantly confused, not orientated to place, time, or people, but orientated to self and age. Language fluent but content disorganized with confusion and changing topics.  Able to follow simple commands but not two step commands, able to repeat but naming 3/5, not able to name parts of object.  Fund of Knowledge was assessed and was impaired.  Cranial Nerves II - XII - II - Visual field intact OU. III, IV, VI - Extraocular movements intact. V - Facial sensation intact bilaterally. VII - mild left nasolabial fold flattening. VIII - Hearing & vestibular intact bilaterally. X - Palate elevates symmetrically. XI - Chin turning & shoulder shrug intact bilaterally. XII - Tongue protrusion intact.  Motor Strength - The patient's strength was normal in all extremities and pronator drift was absent.  Bulk was normal and fasciculations were absent.   Motor Tone - Muscle tone was assessed at the neck and appendages and was normal.  Reflexes - The patient's reflexes were 1+ in all extremities and she had no pathological reflexes.  Sensory - Light touch, temperature/pinprick were assessed and were symmetrical.    Coordination - The patient had normal movements in the hands with no ataxia or dysmetria.  Tremor was absent.  Gait and Station - deferred due to safety concerns.   ASSESSMENT/PLAN Ms. Ayelet Gruenewald is a 82 y.o. female with history of dementia, COPD, hypertension, and dysphagia presenting with worsening speech difficulties, swallowing difficulties, facial drooping, and falls x 2 for the last one week. She did not receive IV t-PA due to late presentation.  Encephalopathy - more consistent with dementia, less likely delirium.   MRI  - No acute intracranial abnormality. Chronic posterior  right MCA infarct   MRA  - left MCA mild to moderate stenosis  Carotid Doppler - unremarkable  2D Echo - EF 55-60%  EEG - diffuse slowing, no seizure  UTI negative pending  LDL - 142  HgbA1c pending  CT chest pending to rule out malignancy  VTE prophylaxis -  Lovenox DIET DYS 3 Room service appropriate?: Yes; Fluid consistency:: Thin  No antithrombotic prior to admission, now on ASA 325. Continue ASA on discharge.  Patient counseled to be compliant with her antithrombotic medications  Ongoing aggressive stroke risk factor management  Therapy recommendations: Pending  Disposition: Pending  Dementia  On aricept at home  Resumed aricept this admission  Pleasantly confused  Hypertension  Stable BP goal normotensive  Dysphagia, chronic  Chronic  Speech on board  On dys 3 diet with thin liquid  Hyperlipidemia  Home meds:  No lipid lowering medications prior to admission.  LDL 148, goal < 70  Put on pravastatin  Continue statin at discharge  Other Stroke Risk Factors  Advanced age  Hx stroke/TIA - ? remote right small right MCA infarct by MRI   Other Active Problems  Mildly  elevated creatinine 1.20  Hospital day # 2  Neurology will sign off. Please call with questions. No neuro follow up needed at this time. Thanks for the consult.   Marvel Plan, MD PhD Stroke Neurology 02/19/2016 6:09 PM      To contact Stroke Continuity provider, please refer to [email or web address]. After hours, contact General Neurology

## 2016-02-19 NOTE — Progress Notes (Signed)
Paged by RN at 1029 stating pt agitated and attempting to get out of bed.  No sitter available.  Haldol 2 mg IV x 2 last night as not effective and ativan 1 mg IV given.  I went to evaluate patient at 1040.  Pt asleep upon my arrival.  Pt is pleasantly confused.  She is conversant upon awakening.  Denies cp, sob, n/v, headache.  CV-RRR Lung--diminished at bases but CTA abd-soft/nt+BS Ext-no CCE  Restart risperdal 1mg  bid and trazadone qhs.  Change Haldol to 5 mg IV q 6 hrs prn agitation.  DTat

## 2016-02-19 NOTE — Progress Notes (Signed)
PROGRESS NOTE  Roberta Griffin ZOX:[id number] DOB: 11/05/1925 DOA: 02/17/2016 PCP: No primary care provider on file.  Brief History:  82 year old female with a history of dementia, hypertension, CKD presents with 2-3 day history of increasing falls. Due to the patient's dementia, she has not provided any significant history. According to the patient's daughter, the patient developed increasing dysarthria and dysphasia on 02/17/2016. There was concern for right facial droop as well as right upper extremity dysesthesias. In emergency department, CT of the brain was negative. Neurology was consulted, and full stroke workup was undertaken.  Assessment/Plan: Acute encephalopathy -Suspect this may actually represent continued functional and cognitive decline due to the patient's underlying dementia -Serum B12--353 -TSH--1.211 -RBC folate--pending -RPR--neg -Ammonia--22 -Urinalysis negative for pyuria -Personally reviewed EKG--sinus rhythm, RBBB -Continue aspirin 81 mg daily  Dysarthria/dysphagia/dysesthesias -02/18/2016 MRI brain negative for acute infarct -MRA brain negative -Carotid duplex negative for hemodynamically significant stenosis -EEG negative for seizure--mild diffuse slowing -personally reviewedChest x-ray negative for infiltrates -LDL 142 -speech eval-->dys 3 diet -PT00>SNF  Dementia -Restart Aricept and Risperdal -Restart trazodone at bedtime  Hypertension -Restart metoprolol succinate  Renal insufficiency, unclear chronicity -start IVF with potassium -am bmp  Abnormal CXR -Soft tissue density of the upper right mediastinal border -hydrate and CT chest with contrast if renal function improved -CT chest  Hypokalemia -replete -check mag   Disposition Plan: SNF 7/10 Family Communication: Daughter updated on 7/8   Consultants: Neurology  Code Status: DNR  DVT Prophylaxis: [place] Lovenox  Subjective: Patient denies fevers, chills, headache,  chest pain, dyspnea, nausea, vomiting, diarrhea, abdominal pain, dysuria, hematuria   Objective: Filed Vitals:   02/18/16 1804 02/18/16 2121 02/19/16 0938 02/19/16 1401  BP: 148/76 153/88 138/82 152/102  Pulse: 88 96 80 62  Temp: 98.6 F (37 C) 98.5 F (36.9 C) 97.9 F (36.6 C) 97.8 F (36.6 C)  TempSrc: Oral Oral Axillary Axillary  Resp: 18 18 18 18   Height:      Weight:      SpO2: 98% 98% 100% 100%    Intake/Output Summary (Last 24 hours) at 02/19/16 1652 Last data filed at 02/18/16 1704  Gross per 24 hour  Intake     40 ml  Output      0 ml  Net     40 ml   Weight change:  Exam:   General:  Pt is alert, follows commands appropriately, not in acute distress  HEENT: No icterus, No thrush, No neck mass, [place]/AT  Cardiovascular: RRR, S1/S2, no rubs, no gallops  Respiratory: Bibasilar rales. No wheezing. Good air movement.  Abdomen: Soft/+BS, non tender, non distended, no guarding  Extremities: No edema, No lymphangitis, No petechiae, No rashes, no synovitis   Data Reviewed: I have personally reviewed following labs and imaging studies Basic Metabolic Panel:  Recent Labs Lab 02/17/16 1746 02/17/16 1818 02/19/16 0316  NA 138 141 140  K 3.5 3.5 3.0*  CL 109 106 106  CO2 22  --  25  GLUCOSE 93 92 112*  BUN 14 16 10   CREATININE 1.19* 1.20* 1.06*  CALCIUM 9.4  --  9.5   Liver Function Tests:  Recent Labs Lab 02/17/16 1746  AST 19  ALT 7*  ALKPHOS 46  BILITOT 0.3  PROT 6.4*  ALBUMIN 3.5   No results for input(s): LIPASE, AMYLASE in the last 168 hours.  Recent Labs Lab 02/18/16 1447  AMMONIA 22   Coagulation Profile:  Recent Labs  Lab 02/17/16 1746  INR 1.14   CBC:  Recent Labs Lab 02/17/16 1746 02/17/16 1818  WBC 7.0  --   NEUTROABS 4.8  --   HGB 11.9* 12.2  HCT 36.1 36.0  MCV 88.5  --   PLT 157  --    Cardiac Enzymes: No results for input(s): CKTOTAL, CKMB, CKMBINDEX, TROPONINI in the last 168 hours. BNP: Invalid input(s):  POCBNP CBG:  Recent Labs Lab 02/17/16 2111 02/18/16 2357  GLUCAP 108* 118*   HbA1C: No results for input(s): HGBA1C in the last 72 hours. Urine analysis:    Component Value Date/Time   COLORURINE YELLOW 02/17/2016 2115   APPEARANCEUR CLEAR 02/17/2016 2115   LABSPEC 1.019 02/17/2016 2115   PHURINE 5.5 02/17/2016 2115   GLUCOSEU NEGATIVE 02/17/2016 2115   HGBUR NEGATIVE 02/17/2016 2115   BILIRUBINUR NEGATIVE 02/17/2016 2115   KETONESUR NEGATIVE 02/17/2016 2115   PROTEINUR NEGATIVE 02/17/2016 2115   NITRITE NEGATIVE 02/17/2016 2115   LEUKOCYTESUR NEGATIVE 02/17/2016 2115   Sepsis Labs: (procalcitonin:4,lacticidven:4) ) Recent Results (from the past 240 hour(s))  Urine culture     Status: Abnormal   Collection Time: 02/17/16  9:15 PM  Result Value Ref Range Status   Specimen Description URINE, RANDOM  Final   Special Requests NONE  Final   Culture MULTIPLE SPECIES PRESENT, SUGGEST RECOLLECTION (A)  Final   Report Status 02/19/2016 FINAL  Final     Scheduled Meds: . aspirin  300 mg Rectal Daily   Or  . aspirin  325 mg Oral Daily  . donepezil  10 mg Oral QHS  . enoxaparin (LOVENOX) injection  40 mg Subcutaneous Daily  . metoprolol succinate  50 mg Oral Daily  . potassium chloride  40 mEq Oral Q6H  . risperiDONE  1 mg Oral BID  . traZODone  50 mg Oral QHS   Continuous Infusions: . sodium chloride 0.9 % 1,000 mL with potassium chloride 20 mEq infusion      Procedures/Studies: Ct Head Wo Contrast  02/17/2016  CLINICAL DATA:  Per pt daughter, c/o slurred speech for a couple of days. Pt family also states the L side of her face has been drooping. Pt has alzheimers and dementia, baseline is confused. Pt answered "20" for how old she was. Pt smiling. EXAM: CT HEAD WITHOUT CONTRAST TECHNIQUE: Contiguous axial images were obtained from the base of the skull through the vertex without intravenous contrast. COMPARISON:  None. FINDINGS: No acute intracranial hemorrhage.  No focal mass lesion. No CT evidence of acute infarction. No midline shift or mass effect. No hydrocephalus. Basilar cisterns are patent. There are periventricular and subcortical white matter hypodensities. Generalized cortical atrophy. Paranasal sinuses and mastoid air cells are clear. Orbits are clear. IMPRESSION: 1. No acute intracranial findings. 2. Marked atrophy and moderate white matter microvascular disease. Electronically Signed   By: Genevive Bi M.D.   On: 02/17/2016 19:13   Mr Brain Wo Contrast  02/18/2016  CLINICAL DATA:  82 year old female with increasing difficulty with speech and swallowing for 1 week. Facial droop. Increased falls. Initial encounter. EXAM: MRI HEAD WITHOUT CONTRAST MRA HEAD WITHOUT CONTRAST TECHNIQUE: Multiplanar, multiecho pulse sequences of the brain and surrounding structures were obtained without intravenous contrast. Angiographic images of the head were obtained using MRA technique without contrast. COMPARISON:  Noncontrast head CT 02/17/2016 FINDINGS: MRI HEAD FINDINGS Metal susceptibility artifact related to a tiny retained metal foreign body in the scalp (series 3, image 28 of the comparison) along the left posterior convexity  mildly degrades diffusion weighted imaging. No convincing restricted diffusion or evidence of acute infarction. Major intracranial vascular flow voids are preserved. Mild intracranial artery dolichoectasia. No midline shift, mass effect, ventriculomegaly, extra-axial collection or acute intracranial hemorrhage. Cervicomedullary junction and pituitary are within normal limits. Negative for age visualized cervical spine. Normal bone marrow signal. There is prominent focal hyperostosis of the right frontal bone versus a small calcified meningioma (series 14, image 22). Minimal mass effect on the adjacent right frontal lobe with no cerebral edema. Small to moderate area of cortical encephalomalacia along the right peri-Rolandic cortex best seen on  series 14, image 12. No chronic cerebral blood products identified. Underlying confluent cerebral white matter T2 and FLAIR hyperintensity, in addition to patchy bilateral similar white matter changes. Deep gray matter nuclei, brainstem, and cerebellum are normal for age. Visible internal auditory structures appear normal. Mastoids are clear. No skullbase abnormality identified. Paranasal sinuses are clear. Postoperative changes to the bilateral globes. Negative scalp soft tissues. MRA HEAD FINDINGS Antegrade flow in the posterior circulation with codominant distal vertebral arteries. Normal PICA origins and no distal vertebral stenosis. Patent vertebrobasilar junction and basilar artery without stenosis. Mild ectasia at the basilar tip. SCA and PCA origins are normal. Left posterior communicating artery is present, the right is diminutive or absent. Tortuous left P1 segment. Bilateral PCA branches are normal. Antegrade flow in both ICA siphons. No siphon stenosis. Normal ophthalmic artery origins. Normal MCA and ACA origins. Tortuous A1 segments. Anterior communicating artery and visualized ACA branches are within normal limits. Mild MCA tortuosity. M1 segments and visualized bilateral MCA branches otherwise are normal. IMPRESSION: 1.  No acute intracranial abnormality. 2. Chronic posterior right MCA infarct with superimposed bilateral white matter changes most suggestive of chronic small vessel disease. 3.  Negative intracranial MRA aside from mild arterial tortuosity. 4. Focal hyperostosis of the right frontal bone versus small right frontal meningioma, appears inconsequential. Electronically Signed   By: Odessa FlemingH  Hall M.D.   On: 02/18/2016 08:19   Dg Chest Port 1 View  02/18/2016  CLINICAL DATA:  82 year old female with a history of difficulty speaking and falling EXAM: PORTABLE CHEST 1 VIEW COMPARISON:  None. FINDINGS: Cardiomediastinal silhouette enlarged. Calcifications of the aortic arch. No evidence of central  vascular congestion. Soft tissue density projects over the upper right mediastinal border, crossing the contours of the clavicular head and upper posterior ribs. Uplifting of the minor fissure No pneumothorax or pleural effusion.  No confluent airspace disease. Surgical changes of prior bilateral reverse shoulder arthroplasty. No displaced fracture. IMPRESSION: No radiographic evidence of acute cardiopulmonary disease. Soft tissue density of the upper right mediastinal border. There is associated uplifting of the minor fissure. Findings are concerning for right upper lobe partial volume loss, and a central obstructing tumor is not excluded. Further evaluation with contrast-enhanced CT is recommended. These results will be called to the ordering clinician or representative by the Radiologist Assistant, and communication documented in the PACS or zVision Dashboard. Cardiomegaly. Aortic atherosclerosis. Signed, Yvone NeuJaime S. Loreta AveWagner, DO [facility] Electronically Signed   By: Gilmer MorJaime  Wagner D.O.   On: 02/18/2016 10:45   Mr Maxine GlennMra Head/brain Wo Cm  02/18/2016  CLINICAL DATA:  82 year old female with increasing difficulty with speech and swallowing for 1 week. Facial droop. Increased falls. Initial encounter. EXAM: MRI HEAD WITHOUT CONTRAST MRA HEAD WITHOUT CONTRAST TECHNIQUE: Multiplanar, multiecho pulse sequences of the brain and surrounding structures were obtained without intravenous contrast. Angiographic images of the head  were obtained using MRA technique without contrast. COMPARISON:  Noncontrast head CT 02/17/2016 FINDINGS: MRI HEAD FINDINGS Metal susceptibility artifact related to a tiny retained metal foreign body in the scalp (series 3, image 28 of the comparison) along the left posterior convexity mildly degrades diffusion weighted imaging. No convincing restricted diffusion or evidence of acute infarction. Major intracranial vascular flow voids are  preserved. Mild intracranial artery dolichoectasia. No midline shift, mass effect, ventriculomegaly, extra-axial collection or acute intracranial hemorrhage. Cervicomedullary junction and pituitary are within normal limits. Negative for age visualized cervical spine. Normal bone marrow signal. There is prominent focal hyperostosis of the right frontal bone versus a small calcified meningioma (series 14, image 22). Minimal mass effect on the adjacent right frontal lobe with no cerebral edema. Small to moderate area of cortical encephalomalacia along the right peri-Rolandic cortex best seen on series 14, image 12. No chronic cerebral blood products identified. Underlying confluent cerebral white matter T2 and FLAIR hyperintensity, in addition to patchy bilateral similar white matter changes. Deep gray matter nuclei, brainstem, and cerebellum are normal for age. Visible internal auditory structures appear normal. Mastoids are clear. No skullbase abnormality identified. Paranasal sinuses are clear. Postoperative changes to the bilateral globes. Negative scalp soft tissues. MRA HEAD FINDINGS Antegrade flow in the posterior circulation with codominant distal vertebral arteries. Normal PICA origins and no distal vertebral stenosis. Patent vertebrobasilar junction and basilar artery without stenosis. Mild ectasia at the basilar tip. SCA and PCA origins are normal. Left posterior communicating artery is present, the right is diminutive or absent. Tortuous left P1 segment. Bilateral PCA branches are normal. Antegrade flow in both ICA siphons. No siphon stenosis. Normal ophthalmic artery origins. Normal MCA and ACA origins. Tortuous A1 segments. Anterior communicating artery and visualized ACA branches are within normal limits. Mild MCA tortuosity. M1 segments and visualized bilateral MCA branches otherwise are normal. IMPRESSION: 1.  No acute intracranial abnormality. 2. Chronic posterior right MCA infarct with superimposed  bilateral white matter changes most suggestive of chronic small vessel disease. 3.  Negative intracranial MRA aside from mild arterial tortuosity. 4. Focal hyperostosis of the right frontal bone versus small right frontal meningioma, appears inconsequential. Electronically Signed   By: Odessa Fleming M.D.   On: 02/18/2016 08:19    TAT, DAVID, DO  [facility] Pager [phone]  If 7PM-7AM, please contact night-coverage [email or web address] Password [email or web address] 02/19/2016, 4:52 PM   LOS: 2 days

## 2016-02-20 ENCOUNTER — Inpatient Hospital Stay (HOSPITAL_COMMUNITY): Payer: Medicare Other

## 2016-02-20 DIAGNOSIS — I2699 Other pulmonary embolism without acute cor pulmonale: Principal | ICD-10-CM

## 2016-02-20 DIAGNOSIS — K8689 Other specified diseases of pancreas: Secondary | ICD-10-CM | POA: Insufficient documentation

## 2016-02-20 DIAGNOSIS — K869 Disease of pancreas, unspecified: Secondary | ICD-10-CM

## 2016-02-20 LAB — GLUCOSE, CAPILLARY
Glucose-Capillary: 125 mg/dL — ABNORMAL HIGH (ref 65–99)
Glucose-Capillary: 125 mg/dL — ABNORMAL HIGH (ref 65–99)
Glucose-Capillary: 93 mg/dL (ref 65–99)
Glucose-Capillary: 126 mg/dL — ABNORMAL HIGH (ref 65–99)
Glucose-Capillary: 82 mg/dL (ref 65–99)
Glucose-Capillary: 105 mg/dL — ABNORMAL HIGH (ref 65–99)
Glucose-Capillary: 125 mg/dL — ABNORMAL HIGH (ref 65–99)

## 2016-02-20 LAB — BASIC METABOLIC PANEL
Anion gap: 8 (ref 5–15)
BUN: 8 mg/dL (ref 4–21)
BUN: 8 mg/dL (ref 6–20)
CO2: 24 mmol/L (ref 22–32)
Calcium: 9.3 mg/dL (ref 8.9–10.3)
Chloride: 110 mmol/L (ref 101–111)
Creatinine, Ser: 0.94 mg/dL (ref 0.44–1.00)
Creatinine: 0.9 mg/dL (ref 0.5–1.1)
GFR calc Af Amer: >60 mL/min (ref >60)
GFR calc non Af Amer: 53 mL/min — ABNORMAL LOW (ref >60)
Glucose, Bld: 100 mg/dL — ABNORMAL HIGH (ref 65–99)
Glucose: 100 mg/dL
Potassium: 4 mmol/L (ref 3.4–5.3)
Potassium: 4 mmol/L (ref 3.5–5.1)
Sodium: 142 mmol/L (ref 135–145)
Sodium: 142 mmol/L (ref 137–147)

## 2016-02-20 LAB — VAS US CAROTID
LEFT ECA DIAS: -1 cm/s
LEFT VERTEBRAL DIAS: -12 cm/s
Left CCA dist dias: 9 cm/s
Left CCA dist sys: 68 cm/s
Left CCA prox dias: -12 cm/s
Left CCA prox sys: -87 cm/s
Left ICA dist dias: -14 cm/s
Left ICA dist sys: -51 cm/s
Left ICA prox dias: -16 cm/s
Left ICA prox sys: -46 cm/s
RIGHT ECA DIAS: -4 cm/s
RIGHT VERTEBRAL DIAS: -7 cm/s
Right CCA prox dias: 12 cm/s
Right CCA prox sys: 85 cm/s
Right cca dist sys: -83 cm/s

## 2016-02-20 LAB — CBC
HCT: 39.9 % (ref 36.0–46.0)
Hemoglobin: 13.2 g/dL (ref 12.0–15.0)
MCH: 29.1 pg (ref 26.0–34.0)
MCHC: 33.1 g/dL (ref 30.0–36.0)
MCV: 87.9 fL (ref 78.0–100.0)
Platelets: 172 10*3/uL (ref 150–400)
RBC: 4.54 MIL/uL (ref 3.87–5.11)
RDW: 12.7 % (ref 11.5–15.5)
WBC: 9.2 10*3/uL (ref 4.0–10.5)

## 2016-02-20 LAB — MAGNESIUM: Magnesium: 1.6 mg/dL — ABNORMAL LOW (ref 1.7–2.4)

## 2016-02-20 LAB — CBC AND DIFFERENTIAL
HCT: 40 % (ref 36–46)
Hemoglobin: 13.2 g/dL (ref 12.0–16.0)
Platelets: 172 10*3/uL (ref 150–399)
WBC: 9.2 10^3/mL

## 2016-02-20 LAB — HEMOGLOBIN A1C
Hgb A1c MFr Bld: 5.6 % (ref 4.8–5.6)
Mean Plasma Glucose: 114 mg/dL

## 2016-02-20 LAB — FOLATE RBC
Folate, Hemolysate: 328.3 ng/mL
Folate, RBC: 857 ng/mL (ref >498)
Hematocrit: 38.3 % (ref 34.0–46.6)

## 2016-02-20 LAB — HEPARIN LEVEL (UNFRACTIONATED)
Heparin Unfractionated: 0.24 IU/mL — ABNORMAL LOW (ref 0.30–0.70)
Heparin Unfractionated: 0.58 IU/mL (ref 0.30–0.70)

## 2016-02-20 MED ORDER — HEPARIN (PORCINE) IN NACL 100-0.45 UNIT/ML-% IJ SOLN
650.0000 [IU]/h | INTRAMUSCULAR | Status: DC
  Administered 2016-02-20: 600 [IU]/h via INTRAVENOUS
  Administered 2016-02-21: 650 [IU]/h via INTRAVENOUS
  Filled 2016-02-20 (×4): qty 250

## 2016-02-20 MED ORDER — IOPAMIDOL (ISOVUE-370) INJECTION 76%
INTRAVENOUS | Status: AC
  Administered 2016-02-20: 100 mL
  Filled 2016-02-20: qty 100

## 2016-02-20 NOTE — Progress Notes (Addendum)
PROGRESS NOTE  Roberta Griffin ZOX:[id number] DOB: October 10, 1925 DOA: 02/17/2016 PCP: No primary care provider on file.  Brief History:  82 year old female with a history of dementia, hypertension, CKD presents with 2-3 day history of increasing falls. Due to the patient's dementia, she has not provided any significant history. According to the patient's daughter, the patient developed increasing dysarthria and dysphasia on 02/17/2016. There was concern for right facial droop as well as right upper extremity dysesthesias. In emergency department, CT of the brain was negative. Neurology was consulted, and full stroke workup was undertaken. Chest x-ray revealed a soft tissue density in the right upper mediastinal border. As a result, CT of the chest was obtained which revealed bilateral pulmonary emboli. The patient was started on intravenous heparin. CT of the chest also revealed an incidental finding of a possible pancreatic tail lesion. As a result CT of the abdomen and pelvis was ordered.  Assessment/Plan: Acute encephalopathy -Suspect this may actually represent continued functional and cognitive decline due to the patient's underlying dementia -Serum B12--353 -TSH--1.211 -RBC folate--857 -RPR--neg -Ammonia--22 -Urinalysis negative for pyuria -Personally reviewed EKG--sinus rhythm, RBBB -D/C aspirin 81 mg daily as Iv heparin started  Acute pulmonary emboli -IV heparin -hold on starting oral agent as there may need to be additional work up for pancreatic tail lesion -02/19/2016 after EF 55-60%, grade 1 DD, normal RV function  Pancreatic tail lesion -CT abdomen and pelvis with focus on pancreas  Dysarthria/dysphagia/dysesthesias -02/18/2016 MRI brain negative for acute infarct -MRA brain negative -Carotid duplex negative for hemodynamically significant stenosis -EEG negative for seizure--mild diffuse slowing -personally reviewedChest x-ray negative for infiltrates -LDL  142--start pravastatin per neurology -speech eval-->dys 3 diet -PT-->SNF -echo--EF 55-60%, grade 1 DD  Dementia -Restart Aricept and Risperdal -Restart trazodone at bedtime  Hypertension -Restarted metoprolol succinate and nifedipine  Renal insufficiency, unclear chronicity -start IVF with potassium  Abnormal CXR -Soft tissue density of the upper right mediastinal border -hydrate and CT chest with contrast if renal function improved -CT chest as above  Hypokalemia -repleted -check mag  Hyperlipidemia -start pravastatin per neurology   Disposition Plan:   Home in 1-2 days  Family Communication:  Left daughter message to call  Consultants:  none  Code Status:  DNR  DVT Prophylaxis: IV heparin   Procedures: As Listed in Progress Note Above  Antibiotics: None    Subjective: Patient denies fevers, chills, headache, chest pain, dyspnea, nausea, vomiting, diarrhea, abdominal pain, dysuria, hematuria. No hematochezia or melena. Patient remains pleasantly confused.   Objective: Filed Vitals:   02/20/16 0106 02/20/16 0550 02/20/16 0913 02/20/16 1333  BP: 147/71 127/69 102/62 114/61  Pulse: 94 77 84 77  Temp: 98.2 F (36.8 C) 98.5 F (36.9 C) 98.7 F (37.1 C) 98.7 F (37.1 C)  TempSrc: Oral Axillary Oral Oral  Resp: 20 18 20 18   Height:      Weight:      SpO2: 100% 99% 100% 99%   No intake or output data in the 24 hours ending 02/20/16 1613 Weight change:  Exam:   General:  Pt is alert, follows commands appropriately, not in acute distress  HEENT: No icterus, No thrush, No neck mass, [place]/AT  Cardiovascular: RRR, S1/S2, no rubs, no gallops  Respiratory: CTA bilaterally, no wheezing, no crackles, no rhonchi  Abdomen: Soft/+BS, non tender, non distended, no guarding  Extremities: No edema, No lymphangitis, No petechiae, No rashes, no synovitis   Data Reviewed: I have  personally reviewed following labs and imaging studies Basic Metabolic  Panel:  Recent Labs Lab 02/17/16 1746 02/17/16 1818 02/19/16 0316 02/20/16 0515  NA 138 141 140 142  K 3.5 3.5 3.0* 4.0  CL 109 106 106 110  CO2 22  --  25 24  GLUCOSE 93 92 112* 100*  BUN 14 16 10 8   CREATININE 1.19* 1.20* 1.06* 0.94  CALCIUM 9.4  --  9.5 9.3  MG  --   --   --  1.6*   Liver Function Tests:  Recent Labs Lab 02/17/16 1746  AST 19  ALT 7*  ALKPHOS 46  BILITOT 0.3  PROT 6.4*  ALBUMIN 3.5   No results for input(s): LIPASE, AMYLASE in the last 168 hours.  Recent Labs Lab 02/18/16 1447  AMMONIA 22   Coagulation Profile:  Recent Labs Lab 02/17/16 1746  INR 1.14   CBC:  Recent Labs Lab 02/17/16 1746 02/17/16 1818 02/18/16 1447 02/20/16 0515  WBC 7.0  --   --  9.2  NEUTROABS 4.8  --   --   --   HGB 11.9* 12.2  --  13.2  HCT 36.1 36.0 38.3 39.9  MCV 88.5  --   --  87.9  PLT 157  --   --  172   Cardiac Enzymes: No results for input(s): CKTOTAL, CKMB, CKMBINDEX, TROPONINI in the last 168 hours. BNP: Invalid input(s): POCBNP CBG:  Recent Labs Lab 02/18/16 2357 02/19/16 0642 02/20/16 0103 02/20/16 0546 02/20/16 1118  GLUCAP 118* 93 126* 82 105*   HbA1C:  Recent Labs  02/18/16 0651  HGBA1C 5.6   Urine analysis:    Component Value Date/Time   COLORURINE YELLOW 02/17/2016 2115   APPEARANCEUR CLEAR 02/17/2016 2115   LABSPEC 1.019 02/17/2016 2115   PHURINE 5.5 02/17/2016 2115   GLUCOSEU NEGATIVE 02/17/2016 2115   HGBUR NEGATIVE 02/17/2016 2115   BILIRUBINUR NEGATIVE 02/17/2016 2115   KETONESUR NEGATIVE 02/17/2016 2115   PROTEINUR NEGATIVE 02/17/2016 2115   NITRITE NEGATIVE 02/17/2016 2115   LEUKOCYTESUR NEGATIVE 02/17/2016 2115   Sepsis Labs: @LABRCNTIP (procalcitonin:4,lacticidven:4) ) Recent Results (from the past 240 hour(s))  Urine culture     Status: Abnormal   Collection Time: 02/17/16  9:15 PM  Result Value Ref Range Status   Specimen Description URINE, RANDOM  Final   Special Requests NONE  Final   Culture  MULTIPLE SPECIES PRESENT, SUGGEST RECOLLECTION (A)  Final   Report Status 02/19/2016 FINAL  Final     Scheduled Meds: . aspirin  300 mg Rectal Daily   Or  . aspirin  325 mg Oral Daily  . donepezil  10 mg Oral QHS  . metoprolol succinate  50 mg Oral Daily  . pravastatin  20 mg Oral q1800  . risperiDONE  1 mg Oral BID  . traZODone  50 mg Oral QHS   Continuous Infusions: . heparin 650 Units/hr (02/20/16 1414)  . sodium chloride 0.9 % 1,000 mL with potassium chloride 20 mEq infusion 75 mL/hr at 02/20/16 1610    Procedures/Studies: Ct Head Wo Contrast  02/17/2016  CLINICAL DATA:  Per pt daughter, c/o slurred speech for a couple of days. Pt family also states the L side of her face has been drooping. Pt has alzheimers and dementia, baseline is confused. Pt answered "32" for how old she was. Pt smiling. EXAM: CT HEAD WITHOUT CONTRAST TECHNIQUE: Contiguous axial images were obtained from the base of the skull through the vertex without intravenous contrast. COMPARISON:  None.  FINDINGS: No acute intracranial hemorrhage. No focal mass lesion. No CT evidence of acute infarction. No midline shift or mass effect. No hydrocephalus. Basilar cisterns are patent. There are periventricular and subcortical white matter hypodensities. Generalized cortical atrophy. Paranasal sinuses and mastoid air cells are clear. Orbits are clear. IMPRESSION: 1. No acute intracranial findings. 2. Marked atrophy and moderate white matter microvascular disease. Electronically Signed   By: Genevive Bi M.D.   On: 02/17/2016 19:13   Ct Chest W Contrast  02/20/2016  CLINICAL DATA:  Abnormal chest x-ray suggesting upper right mediastinal soft tissue density and volume loss. EXAM: CT CHEST WITH CONTRAST TECHNIQUE: Multidetector CT imaging of the chest was performed during intravenous contrast administration. CONTRAST:  80 mL Isovue 300 COMPARISON:  Chest 02/18/2016 FINDINGS: Filling defects are demonstrated in multiple bilateral  lower lobe and right upper lobe segmental and subsegmental pulmonary arteries consistent with pulmonary embolus. Low clot burden is present. There is diffuse cardiac enlargement likely representing pre-existing heart failure. Small pericardial effusion. Coronary artery calcifications. Normal caliber thoracic aorta. No dissection. Heterogeneous left thyroid nodule measuring 2.3 cm diameter. Consider follow-up with elective ultrasound. No significant lymphadenopathy in the chest. Scattered fibrosis in the lungs. No focal airspace disease or consolidation. Atelectasis in the lung bases. No obstructing mass lesion is demonstrated. Chest x-ray findings probably relate to vascular structures. No pleural effusions. No pneumothorax. Included portions of the upper abdominal organs demonstrate transverse colon anteriorly. The pancreas is atrophic with mild pancreatic ductal dilatation. Suggestion of a low-attenuation lesion in the pancreatic tail possibly representing a mucinous tumor. Suggest follow-up with CT of the abdomen and pelvis in the elective setting for further evaluation. Degenerative changes in the spine. No destructive bone lesions. Bilateral shoulder arthroplasties. IMPRESSION: Filling defects in multiple bilateral segmental and subsegmental pulmonary arteries consistent with pulmonary embolus. Clot burden slow. Diffuse cardiac enlargement likely represents pre-existing heart failure. Small pericardial effusion. Left thyroid gland nodule. Dilated pancreatic duct with possible low-attenuation lesion in the pancreatic tail. These results were called by telephone at the time of interpretation on 02/20/2016 at 02:17 Am to Dr. Georgia Dom, who verbally acknowledged these results. Electronically Signed   By: Burman Nieves M.D.   On: 02/20/2016 02:22   Mr Brain Wo Contrast  02/18/2016  CLINICAL DATA:  82 year old female with increasing difficulty with speech and swallowing for 1 week. Facial droop. Increased falls.  Initial encounter. EXAM: MRI HEAD WITHOUT CONTRAST MRA HEAD WITHOUT CONTRAST TECHNIQUE: Multiplanar, multiecho pulse sequences of the brain and surrounding structures were obtained without intravenous contrast. Angiographic images of the head were obtained using MRA technique without contrast. COMPARISON:  Noncontrast head CT 02/17/2016 FINDINGS: MRI HEAD FINDINGS Metal susceptibility artifact related to a tiny retained metal foreign body in the scalp (series 3, image 28 of the comparison) along the left posterior convexity mildly degrades diffusion weighted imaging. No convincing restricted diffusion or evidence of acute infarction. Major intracranial vascular flow voids are preserved. Mild intracranial artery dolichoectasia. No midline shift, mass effect, ventriculomegaly, extra-axial collection or acute intracranial hemorrhage. Cervicomedullary junction and pituitary are within normal limits. Negative for age visualized cervical spine. Normal bone marrow signal. There is prominent focal hyperostosis of the right frontal bone versus a small calcified meningioma (series 14, image 22). Minimal mass effect on the adjacent right frontal lobe with no cerebral edema. Small to moderate area of cortical encephalomalacia along the right peri-Rolandic cortex best seen on series 14, image 12. No chronic cerebral blood products identified. Underlying confluent cerebral white  matter T2 and FLAIR hyperintensity, in addition to patchy bilateral similar white matter changes. Deep gray matter nuclei, brainstem, and cerebellum are normal for age. Visible internal auditory structures appear normal. Mastoids are clear. No skullbase abnormality identified. Paranasal sinuses are clear. Postoperative changes to the bilateral globes. Negative scalp soft tissues. MRA HEAD FINDINGS Antegrade flow in the posterior circulation with codominant distal vertebral arteries. Normal PICA origins and no distal vertebral stenosis. Patent  vertebrobasilar junction and basilar artery without stenosis. Mild ectasia at the basilar tip. SCA and PCA origins are normal. Left posterior communicating artery is present, the right is diminutive or absent. Tortuous left P1 segment. Bilateral PCA branches are normal. Antegrade flow in both ICA siphons. No siphon stenosis. Normal ophthalmic artery origins. Normal MCA and ACA origins. Tortuous A1 segments. Anterior communicating artery and visualized ACA branches are within normal limits. Mild MCA tortuosity. M1 segments and visualized bilateral MCA branches otherwise are normal. IMPRESSION: 1.  No acute intracranial abnormality. 2. Chronic posterior right MCA infarct with superimposed bilateral white matter changes most suggestive of chronic small vessel disease. 3.  Negative intracranial MRA aside from mild arterial tortuosity. 4. Focal hyperostosis of the right frontal bone versus small right frontal meningioma, appears inconsequential. Electronically Signed   By: Odessa FlemingH  Hall M.D.   On: 02/18/2016 08:19   Dg Chest Port 1 View  02/18/2016  CLINICAL DATA:  82 year old female with a history of difficulty speaking and falling EXAM: PORTABLE CHEST 1 VIEW COMPARISON:  None. FINDINGS: Cardiomediastinal silhouette enlarged. Calcifications of the aortic arch. No evidence of central vascular congestion. Soft tissue density projects over the upper right mediastinal border, crossing the contours of the clavicular head and upper posterior ribs. Uplifting of the minor fissure No pneumothorax or pleural effusion.  No confluent airspace disease. Surgical changes of prior bilateral reverse shoulder arthroplasty. No displaced fracture. IMPRESSION: No radiographic evidence of acute cardiopulmonary disease. Soft tissue density of the upper right mediastinal border. There is associated uplifting of the minor fissure. Findings are concerning for right upper lobe partial volume loss, and a central obstructing tumor is not excluded.  Further evaluation with contrast-enhanced CT is recommended. These results will be called to the ordering clinician or representative by the Radiologist Assistant, and communication documented in the PACS or zVision Dashboard. Cardiomegaly. Aortic atherosclerosis. Signed, Yvone NeuJaime S. Loreta AveWagner, DO [facility] Electronically Signed   By: Gilmer MorJaime  Wagner D.O.   On: 02/18/2016 10:45   Mr Maxine GlennMra Head/brain Wo Cm  02/18/2016  CLINICAL DATA:  82 year old female with increasing difficulty with speech and swallowing for 1 week. Facial droop. Increased falls. Initial encounter. EXAM: MRI HEAD WITHOUT CONTRAST MRA HEAD WITHOUT CONTRAST TECHNIQUE: Multiplanar, multiecho pulse sequences of the brain and surrounding structures were obtained without intravenous contrast. Angiographic images of the head were obtained using MRA technique without contrast. COMPARISON:  Noncontrast head CT 02/17/2016 FINDINGS: MRI HEAD FINDINGS Metal susceptibility artifact related to a tiny retained metal foreign body in the scalp (series 3, image 28 of the comparison) along the left posterior convexity mildly degrades diffusion weighted imaging. No convincing restricted diffusion or evidence of acute infarction. Major intracranial vascular flow voids are preserved. Mild intracranial artery dolichoectasia. No midline shift, mass effect, ventriculomegaly, extra-axial collection or acute intracranial hemorrhage. Cervicomedullary junction and pituitary are within normal limits. Negative for age visualized cervical spine. Normal bone marrow signal. There is prominent focal hyperostosis of the right frontal bone versus a small calcified meningioma (series 14, image  22). Minimal mass effect on the adjacent right frontal lobe with no cerebral edema. Small to moderate area of cortical encephalomalacia along the right peri-Rolandic cortex best seen on series 14, image 12. No chronic cerebral blood products  identified. Underlying confluent cerebral white matter T2 and FLAIR hyperintensity, in addition to patchy bilateral similar white matter changes. Deep gray matter nuclei, brainstem, and cerebellum are normal for age. Visible internal auditory structures appear normal. Mastoids are clear. No skullbase abnormality identified. Paranasal sinuses are clear. Postoperative changes to the bilateral globes. Negative scalp soft tissues. MRA HEAD FINDINGS Antegrade flow in the posterior circulation with codominant distal vertebral arteries. Normal PICA origins and no distal vertebral stenosis. Patent vertebrobasilar junction and basilar artery without stenosis. Mild ectasia at the basilar tip. SCA and PCA origins are normal. Left posterior communicating artery is present, the right is diminutive or absent. Tortuous left P1 segment. Bilateral PCA branches are normal. Antegrade flow in both ICA siphons. No siphon stenosis. Normal ophthalmic artery origins. Normal MCA and ACA origins. Tortuous A1 segments. Anterior communicating artery and visualized ACA branches are within normal limits. Mild MCA tortuosity. M1 segments and visualized bilateral MCA branches otherwise are normal. IMPRESSION: 1.  No acute intracranial abnormality. 2. Chronic posterior right MCA infarct with superimposed bilateral white matter changes most suggestive of chronic small vessel disease. 3.  Negative intracranial MRA aside from mild arterial tortuosity. 4. Focal hyperostosis of the right frontal bone versus small right frontal meningioma, appears inconsequential. Electronically Signed   By: Odessa Fleming M.D.   On: 02/18/2016 08:19    TAT, DAVID, DO  [facility] Pager [phone]  If 7PM-7AM, please contact night-coverage [email or web address] Password [email or web address] 02/20/2016, 4:13 PM   LOS: 3 days

## 2016-02-20 NOTE — Care Management Note (Signed)
Case Management Note  Patient Details  Name: Laurena BeringDorothy Kuhar MRN: [id number] Date of Birth: 11/17/1925  Subjective/Objective:      Pt in to r/o CVA. Negative MRI but positive CT for PE. Pt is on heparin gtt. She is from home with family.              Action/Plan: PT/OT recommendations are for SNF. CM following for discharge needs.   Expected Discharge Date:                  Expected Discharge Plan:  Skilled Nursing Facility  In-House Referral:     Discharge planning Services     Post Acute Care Choice:    Choice offered to:     DME Arranged:    DME Agency:     HH Arranged:    HH Agency:     Status of Service:  In process, will continue to follow  If discussed at Long Length of Stay Meetings, dates discussed:    Additional Comments:  Kermit BaloKelli F Willard, RN 02/20/2016, 2:42 PM

## 2016-02-20 NOTE — Progress Notes (Signed)
ANTICOAGULATION CONSULT NOTE - Follow Up Consult  Pharmacy Consult for Heparin Indication: atrial fibrillation  No Known Allergies  Patient Measurements: Height: 5\' 3"  (160 cm) Weight: 113 lb 4.8 oz (51.393 kg) IBW/kg (Calculated) : 52.4 Heparin Dosing Weight:  51.4 kg  Vital Signs: Temp: 98.5 F (36.9 C) (07/10 2111) Temp Source: Oral (07/10 2111) BP: 146/91 mmHg (07/10 2111) Pulse Rate: 78 (07/10 2111)  Labs:  Recent Labs  02/18/16 1447 02/19/16 0316 02/20/16 0515 02/20/16 1258 02/20/16 2051  HGB  --   --  13.2  --   --   HCT 38.3  --  39.9  --   --   PLT  --   --  172  --   --   HEPARINUNFRC  --   --   --  0.24* 0.58  CREATININE  --  1.06* 0.94  --   --     Estimated Creatinine Clearance: 33.6 mL/min (by C-G formula based on Cr of 0.94).  Assessment: 82 y/o F here with worsening speech difficulties, swallowing difficulties, facial drooping, and falls x 2 for the last one week.  undergoing stroke work-up (MRI negative, MRA with mild to mod stenosis), CT chest was done to rule out malignancy but incidentally found NEW ONSET PE. Last Hgb was 12.2, renal function ok, baseline INR is normal.  Anticoagulation: New onset PE. CBC wNL. Has fallen twice this past week  PM f/u> Heparin level now at goal on heparin 650 units/hr.  NO bleeding or complications noted.  Goal of Therapy:  Heparin level 0.3-0.7 units/ml Monitor platelets by anticoagulation protocol: Yes   Plan:  Continue IV heparin 650 units/hr Daily heparin level and CBC F/u plans for oral anticoagulation.  Tad MooreJessica Carney, Pharm D, BCPS  Clinical Pharmacist Pager [phone]  02/20/2016 9:34 PM

## 2016-02-20 NOTE — Progress Notes (Signed)
ANTICOAGULATION CONSULT NOTE - Follow Up Consult  Pharmacy Consult for Heparin Indication: atrial fibrillation  No Known Allergies  Patient Measurements: Height: 5\' 3"  (160 cm) Weight: 113 lb 4.8 oz (51.393 kg) IBW/kg (Calculated) : 52.4 Heparin Dosing Weight:  51.4 kg  Vital Signs: Temp: 98.7 F (37.1 C) (07/10 1333) Temp Source: Oral (07/10 1333) BP: 114/61 mmHg (07/10 1333) Pulse Rate: 77 (07/10 1333)  Labs:  Recent Labs  02/17/16 1746 02/17/16 1818 02/19/16 0316 02/20/16 0515 02/20/16 1258  HGB 11.9* 12.2  --  13.2  --   HCT 36.1 36.0  --  39.9  --   PLT 157  --   --  172  --   APTT 29  --   --   --   --   LABPROT 14.8  --   --   --   --   INR 1.14  --   --   --   --   HEPARINUNFRC  --   --   --   --  0.24*  CREATININE 1.19* 1.20* 1.06* 0.94  --     Estimated Creatinine Clearance: 33.6 mL/min (by C-G formula based on Cr of 0.94).  Assessment: 82 y/o F here with worsening speech difficulties, swallowing difficulties, facial drooping, and falls x 2 for the last one week.  undergoing stroke work-up (MRI negative, MRA with mild to mod stenosis), CT chest was done to rule out malignancy but incidentally found NEW ONSET PE. Last Hgb was 12.2, renal function ok, baseline INR is normal.  Anticoagulation: New onset PE. CBC wNL. Has fallen twice this past week. HL 0.24 low  Goal of Therapy:  Heparin level 0.3-0.7 units/ml Monitor platelets by anticoagulation protocol: Yes   Plan:  Increased IV heparin to 650 units/hr Recheck level in 6-8 hrs.  Crystal S. Merilynn Finlandobertson, PharmD, BCPS Clinical Staff Pharmacist Pager [phone]  Misty Stanleyobertson, Crystal Stillinger 02/20/2016,1:54 PM

## 2016-02-20 NOTE — Progress Notes (Signed)
Speech Language Pathology Treatment: Dysphagia  Patient Details Name: Roberta Griffin MRN: [id number] DOB: [birth year] Today's Date: 02/20/2016 Time: 3818-2993 SLP Time Calculation (min) (ACUTE ONLY): 24 min  Assessment / Plan / Recommendation Clinical Impression  Pt observed partially reclined in bed = Requested assistance from NT to reposition pt for safe po.  Observed pt consuming breakfast including biscuit, eggs, oatmeal, juice and coffee.  Slow but effective mastication noted - and no indications of airway compromise.    Pt reports displeasure with cold drinks - posted sign to request no ice!  SLP to sign off as pt appears to be tolerating po well and education completed.  Please reorder if indicated.    HPI HPI: 82 y.o. female with a past medical history significant for dementia, HTN, and CKD who presents with abnormal speech for 3-4 days.      SLP Plan  All goals met     Recommendations  Diet recommendations: Dysphagia 3 (mechanical soft);Thin liquid Liquids provided via: Cup;Straw Medication Administration: Whole meds with liquid Supervision: Patient able to self feed Compensations: Slow rate;Small sips/bites Postural Changes and/or Swallow Maneuvers: Seated upright 90 degrees;Upright 30-60 min after meal             Oral Care Recommendations: Oral care BID Follow up Recommendations: 24 hour supervision/assistance Plan: All goals met     [place], [place], Ric[facility] [place] [phone]

## 2016-02-20 NOTE — Care Management Important Message (Signed)
Important Message  Patient Details  Name: Roberta BeringDorothy Tuazon MRN: [id number] Date of Birth: 01/26/1926   Medicare Important Message Given:  Yes    Bernadette HoitShoffner, Susan Coleman 02/20/2016, 9:00 AM

## 2016-02-20 NOTE — Progress Notes (Signed)
ANTICOAGULATION CONSULT NOTE - Initial Consult  Pharmacy Consult for Heparin  Indication: pulmonary embolus  No Known Allergies  Patient Measurements: Height: 5\' 3"  (160 cm) Weight: 113 lb 4.8 oz (51.393 kg) IBW/kg (Calculated) : 52.4  Vital Signs: Temp: 98.2 F (36.8 C) (07/10 0106) Temp Source: Oral (07/10 0106) BP: 147/71 mmHg (07/10 0106) Pulse Rate: 94 (07/10 0106)  Labs:  Recent Labs  02/17/16 1746 02/17/16 1818 02/19/16 0316  HGB 11.9* 12.2  --   HCT 36.1 36.0  --   PLT 157  --   --   APTT 29  --   --   LABPROT 14.8  --   --   INR 1.14  --   --   CREATININE 1.19* 1.20* 1.06*   Estimated Creatinine Clearance: 29.8 mL/min (by C-G formula based on Cr of 1.06).  Medical History: Past Medical History  Diagnosis Date  . Scoliosis   . Dementia   . COPD (chronic obstructive pulmonary disease) (HCC)   . Hypertension   . Dysphagia   . Depression    Assessment: 82 y/o F here undergoing stroke work-up (MRI negative, MRA with mild to mod stenosis), CT chest was done to rule out malignancy but incidentally found NEW ONSET PE. Last Hgb was 12.2, renal function ok, baseline INR is normal.  Goal of Therapy:  Heparin level 0.3-0.5 units/mL Monitor platelets by anticoagulation protocol: Yes   Plan:  -DC Lovenox -Start heparin at 600 units/hr -1100 HL -Daily CBC/HL -Monitor for bleeding  Abran DukeLedford, James 02/20/2016,2:29 AM

## 2016-02-21 ENCOUNTER — Inpatient Hospital Stay (HOSPITAL_COMMUNITY): Payer: Medicare Other

## 2016-02-21 DIAGNOSIS — J438 Other emphysema: Secondary | ICD-10-CM

## 2016-02-21 LAB — CBC
HCT: 42.8 % (ref 36.0–46.0)
Hemoglobin: 14.2 g/dL (ref 12.0–15.0)
MCH: 29 pg (ref 26.0–34.0)
MCHC: 33.2 g/dL (ref 30.0–36.0)
MCV: 87.5 fL (ref 78.0–100.0)
Platelets: 215 10*3/uL (ref 150–400)
RBC: 4.89 MIL/uL (ref 3.87–5.11)
RDW: 12.7 % (ref 11.5–15.5)
WBC: 8.9 10*3/uL (ref 4.0–10.5)

## 2016-02-21 LAB — GLUCOSE, CAPILLARY
Glucose-Capillary: 103 mg/dL — ABNORMAL HIGH (ref 65–99)
Glucose-Capillary: 110 mg/dL — ABNORMAL HIGH (ref 65–99)
Glucose-Capillary: 114 mg/dL — ABNORMAL HIGH (ref 65–99)
Glucose-Capillary: 90 mg/dL (ref 65–99)
Glucose-Capillary: 92 mg/dL (ref 65–99)

## 2016-02-21 LAB — CBC AND DIFFERENTIAL: WBC: 8.9 10^3/mL

## 2016-02-21 LAB — HEPARIN LEVEL (UNFRACTIONATED): Heparin Unfractionated: 0.65 IU/mL (ref 0.30–0.70)

## 2016-02-21 MED ORDER — APIXABAN 5 MG PO TABS
10.0000 mg | ORAL_TABLET | Freq: Two times a day (BID) | ORAL | Status: DC
  Administered 2016-02-21 – 2016-02-23 (×4): 10 mg via ORAL
  Filled 2016-02-21 (×4): qty 2

## 2016-02-21 MED ORDER — ACETAMINOPHEN 325 MG PO TABS: 650.0000 mg | ORAL_TABLET | Freq: Four times a day (QID) | ORAL | Status: DC | PRN

## 2016-02-21 MED ORDER — APIXABAN 5 MG PO TABS: 10.0000 mg | ORAL_TABLET | Freq: Two times a day (BID) | ORAL | Status: DC

## 2016-02-21 MED ORDER — TRAMADOL HCL 50 MG PO TABS: 50.0000 mg | ORAL_TABLET | Freq: Four times a day (QID) | ORAL | Status: DC | PRN

## 2016-02-21 MED ORDER — MAGNESIUM SULFATE 2 GM/50ML IV SOLN
2.0000 g | Freq: Once | INTRAVENOUS | Status: AC
  Administered 2016-02-21: 2 g via INTRAVENOUS
  Filled 2016-02-21: qty 50

## 2016-02-21 MED ORDER — APIXABAN 5 MG PO TABS: 5.0000 mg | ORAL_TABLET | Freq: Two times a day (BID) | ORAL | Status: DC

## 2016-02-21 NOTE — Discharge Summary (Addendum)
Physician Discharge Summary  Roberta Griffin WUJ:[id number] DOB: 06-23-1926 DOA: 02/17/2016  PCP: No primary care provider on file.  Admit date: 02/17/2016 Discharge date: 02/21/2016  Admitted From:  Home Disposition:  [place]  Recommendations for Outpatient Follow-up:  1. Follow up with PCP in 1-2 weeks 2. Please obtain BMP/CBC in one week    Discharge Condition: stable CODE STATUS: FULL Diet recommendation: Heart Healthy   Brief/Interim Summary: 82 year old female with a history of dementia, hypertension, CKD presents with 2-3 day history of increasing falls. Due to the patient's dementia, she has not provided any significant history. According to the patient's daughter, the patient developed increasing dysarthria and dysphasia on 02/17/2016. There was concern for right facial droop as well as right upper extremity dysesthesias. In emergency department, CT of the brain was negative. Neurology was consulted, and full stroke workup was undertaken.MRI brain was negative for acute findings. Neurology felt that her dysarthria and dysesthesias were likely due to progression of her cognitive impairment. Her admission chest x-ray suggested possible soft tissue density in the right mediastinal border. As a result CT of the chest was obtained. CT of the chest revealed bilateral pulmonary emboli. As a result, the patient was started on intravenous heparin. CT of the chest also revealed possible pancreatic mass in the pancreatic tail. 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    Discharge Diagnoses:  Acute encephalopathy -Suspect this may actually represent continued functional and cognitive decline due to the patient's underlying dementia -Serum  B12--353 -TSH--1.211 -RBC folate--857 -RPR--neg -Ammonia--22 -Urinalysis negative for pyuria -Personally reviewed EKG--sinus rhythm, RBBB  Acute pulmonary emboli -IV heparin initially -hold on starting oral agent as there may need to be additional work up for pancreatic tail lesion -02/19/2016 after EF 55-60%, grade 1 DD, normal RV function -D/C aspirin 81 mg daily as Iv heparin started -transition IV heparin to po apixaban -Eliquis 10 mg bid x 7 days, then 5 mg bid starting 02/29/16  Pancreatic tail lesions/masses -CT abdomen and pelvis--Multiple similar small low-attenuation pancreatic lesions scattered in the pancreatic head and tail without appreciable enhancement, consistent with nonaggressive cystic pancreatic lesions, 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  -Appreciate GI consult-->repeat CT abd/pelvis in one year  Dysarthria/dysphagia/dysesthesias -02/18/2016 MRI brain negative for acute infarct -MRA brain negative -Carotid duplex negative for hemodynamically significant stenosis -EEG negative for seizure--mild diffuse slowing -personally reviewedChest x-ray negative for infiltrates -LDL 142--start pravastatin per neurology -speech eval-->dys 3 diet thin liquid -PT-->SNF -echo--EF 55-60%, grade 1 DD  Dementia -Restart Aricept and Risperdal -Restart trazodone at bedtime  Hypertension -Restarted metoprolol succinate -d/c nifidipine; BP labile but acceptable  Renal insufficiency, unclear chronicity/Dehydration -start IVF with potassium -renal function improved  1.2-->0.94  Abnormal CXR -Soft tissue density of the upper right mediastinal border -hydrate and CT chest with contrast if renal function improved -CT chest as above  Hypokalemia -repleted -check mag  Hyperlipidemia -start pravastatin per neurology  Hip pain -xrays of pelvis--neg for fracture or dislocation -tramadol and acetaminophen prn pain   Discharge Instructions     Medication List    STOP  taking these medications        meloxicam 7.5 MG tablet  Commonly known as:  MOBIC     NIFEdipine 90 MG 24 hr tablet  Commonly known as:  PROCARDIA XL/ADALAT-CC  TAKE these medications        3-IN-1 BEDSIDE TOILET Misc  1 Units by Does not apply route once.     acetaminophen 325 MG tablet  Commonly known as:  TYLENOL  Take 2 tablets (650 mg total) by mouth every 6 (six) hours as needed for mild pain.     apixaban 5 MG Tabs tablet  Commonly known as:  ELIQUIS  Take 2 tablets (10 mg total) by mouth 2 (two) times daily.     apixaban 5 MG Tabs tablet  Commonly known as:  ELIQUIS  Take 1 tablet (5 mg total) by mouth 2 (two) times daily.  Start taking on:  02/29/2016     donepezil 10 MG tablet  Commonly known as:  ARICEPT  Take 1 tablet (10 mg total) by mouth at bedtime.     lidocaine 5 %  Commonly known as:  LIDODERM  Place 1 patch onto the skin daily. Remove & Discard patch within 12 hours or as directed by MD     metoprolol succinate 50 MG 24 hr tablet  Commonly known as:  TOPROL-XL  Take 1 tablet (50 mg total) by mouth daily. Take with or immediately following a meal.     pravastatin 20 MG tablet  Commonly known as:  PRAVACHOL  Take 1 tablet (20 mg total) by mouth daily at 6 PM.     risperiDONE 1 MG tablet  Commonly known as:  RISPERDAL  Take 1 mg by mouth 2 (two) times daily.     traMADol 50 MG tablet  Commonly known as:  ULTRAM  Take 1 tablet (50 mg total) by mouth every 6 (six) hours as needed for moderate pain.     traZODone 50 MG tablet  Commonly known as:  DESYREL  Take 1 tablet (50 mg total) by mouth at bedtime.        No Known Allergies  Consultations:  [facility]  Neurology   Procedures/Studies: Ct Head Wo Contrast  02/17/2016  CLINICAL DATA:  Per pt daughter, c/o slurred speech for a couple of days. Pt family also states the L side of her face has been drooping. Pt has alzheimers and dementia, baseline is confused. Pt answered "15" for how  old she was. Pt smiling. EXAM: CT HEAD WITHOUT CONTRAST TECHNIQUE: Contiguous axial images were obtained from the base of the skull through the vertex without intravenous contrast. COMPARISON:  None. FINDINGS: No acute intracranial hemorrhage. No focal mass lesion. No CT evidence of acute infarction. No midline shift or mass effect. No hydrocephalus. Basilar cisterns are patent. There are periventricular and subcortical white matter hypodensities. Generalized cortical atrophy. Paranasal sinuses and mastoid air cells are clear. Orbits are clear. IMPRESSION: 1. No acute intracranial findings. 2. Marked atrophy and moderate white matter microvascular disease. Electronically Signed   By: Genevive Bi M.D.   On: 02/17/2016 19:13   Ct Chest W Contrast  02/20/2016  CLINICAL DATA:  Abnormal chest x-ray suggesting upper right mediastinal soft tissue density and volume loss. EXAM: CT CHEST WITH CONTRAST TECHNIQUE: Multidetector CT imaging of the chest was performed during intravenous contrast administration. CONTRAST:  80 mL Isovue 300 COMPARISON:  Chest 02/18/2016 FINDINGS: Filling defects are demonstrated in multiple bilateral lower lobe and right upper lobe segmental and subsegmental pulmonary arteries consistent with pulmonary embolus. Low clot burden is present. There is diffuse cardiac enlargement likely representing pre-existing heart failure. Small pericardial effusion. Coronary artery calcifications. Normal caliber thoracic aorta. No dissection. Heterogeneous left thyroid  nodule measuring 2.3 cm diameter. Consider follow-up with elective ultrasound. No significant lymphadenopathy in the chest. Scattered fibrosis in the lungs. No focal airspace disease or consolidation. Atelectasis in the lung bases. No obstructing mass lesion is demonstrated. Chest x-ray findings probably relate to vascular structures. No pleural effusions. No pneumothorax. Included portions of the upper abdominal organs demonstrate transverse  colon anteriorly. The pancreas is atrophic with mild pancreatic ductal dilatation. Suggestion of a low-attenuation lesion in the pancreatic tail possibly representing a mucinous tumor. Suggest follow-up with CT of the abdomen and pelvis in the elective setting for further evaluation. Degenerative changes in the spine. No destructive bone lesions. Bilateral shoulder arthroplasties. IMPRESSION: Filling defects in multiple bilateral segmental and subsegmental pulmonary arteries consistent with pulmonary embolus. Clot burden slow. Diffuse cardiac enlargement likely represents pre-existing heart failure. Small pericardial effusion. Left thyroid gland nodule. Dilated pancreatic duct with possible low-attenuation lesion in the pancreatic tail. These results were called by telephone at the time of interpretation on 02/20/2016 at 02:17 Am to Dr. Georgia Dom, who verbally acknowledged these results. Electronically Signed   By: Burman Nieves M.D.   On: 02/20/2016 02:22   Mr Brain Wo Contrast  02/18/2016  CLINICAL DATA:  82 year old female with increasing difficulty with speech and swallowing for 1 week. Facial droop. Increased falls. Initial encounter. EXAM: MRI HEAD WITHOUT CONTRAST MRA HEAD WITHOUT CONTRAST TECHNIQUE: Multiplanar, multiecho pulse sequences of the brain and surrounding structures were obtained without intravenous contrast. Angiographic images of the head were obtained using MRA technique without contrast. COMPARISON:  Noncontrast head CT 02/17/2016 FINDINGS: MRI HEAD FINDINGS Metal susceptibility artifact related to a tiny retained metal foreign body in the scalp (series 3, image 28 of the comparison) along the left posterior convexity mildly degrades diffusion weighted imaging. No convincing restricted diffusion or evidence of acute infarction. Major intracranial vascular flow voids are preserved. Mild intracranial artery dolichoectasia. No midline shift, mass effect, ventriculomegaly, extra-axial collection  or acute intracranial hemorrhage. Cervicomedullary junction and pituitary are within normal limits. Negative for age visualized cervical spine. Normal bone marrow signal. There is prominent focal hyperostosis of the right frontal bone versus a small calcified meningioma (series 14, image 22). Minimal mass effect on the adjacent right frontal lobe with no cerebral edema. Small to moderate area of cortical encephalomalacia along the right peri-Rolandic cortex best seen on series 14, image 12. No chronic cerebral blood products identified. Underlying confluent cerebral white matter T2 and FLAIR hyperintensity, in addition to patchy bilateral similar white matter changes. Deep gray matter nuclei, brainstem, and cerebellum are normal for age. Visible internal auditory structures appear normal. Mastoids are clear. No skullbase abnormality identified. Paranasal sinuses are clear. Postoperative changes to the bilateral globes. Negative scalp soft tissues. MRA HEAD FINDINGS Antegrade flow in the posterior circulation with codominant distal vertebral arteries. Normal PICA origins and no distal vertebral stenosis. Patent vertebrobasilar junction and basilar artery without stenosis. Mild ectasia at the basilar tip. SCA and PCA origins are normal. Left posterior communicating artery is present, the right is diminutive or absent. Tortuous left P1 segment. Bilateral PCA branches are normal. Antegrade flow in both ICA siphons. No siphon stenosis. Normal ophthalmic artery origins. Normal MCA and ACA origins. Tortuous A1 segments. Anterior communicating artery and visualized ACA branches are within normal limits. Mild MCA tortuosity. M1 segments and visualized bilateral MCA branches otherwise are normal. IMPRESSION: 1.  No acute intracranial abnormality. 2. Chronic posterior right MCA infarct with superimposed bilateral white matter changes most suggestive of chronic small  vessel disease. 3.  Negative intracranial MRA aside from mild  arterial tortuosity. 4. Focal hyperostosis of the right frontal bone versus small right frontal meningioma, appears inconsequential. Electronically Signed   By: Odessa Fleming M.D.   On: 02/18/2016 08:19   Ct Abdomen Pelvis W Contrast  02/21/2016  CLINICAL DATA:  82 year old female inpatient with possible pancreatic on chest CT. EXAM: CT ABDOMEN AND PELVIS WITH CONTRAST TECHNIQUE: Multidetector CT imaging of the abdomen and pelvis was performed using the standard protocol following bolus administration of intravenous contrast. CONTRAST:  100 cc Isovue 370 IV. COMPARISON:  02/19/2016 chest CT. FINDINGS: Motion degraded scan. Lower chest: No significant pulmonary nodules or acute consolidative airspace disease. Small to moderate pericardial effusion/thickening measuring up to the 13 mm thickness anteriorly. Hepatobiliary: Normal liver size. No liver mass. Small subcapsular focus of arterial phase hyperenhancement in the far inferior right liver is occult on other sequences, consistent with transient hepatic attenuation difference. Probable 9 mm layering gallstone in the gallbladder surrounded by vicarious excretion of IV contrast within the gallbladder lumen, with no gallbladder wall thickening or pericholecystic fluid. No biliary ductal dilatation. Pancreas: There are multiple (at least 5) similar-appearing low-attenuation lesions scattered throughout the pancreatic head and tail, largest 1.6 x 0.6 x 0.9 cm in the uncinate process (series 11/ image 71) and 1.1 x 0.7 x 1.2 cm in the pancreatic tail (series 20/image 68), which demonstrate a lobulated contour and no appreciable enhancement. There is mild diffuse main pancreatic duct dilation (3 mm diameter). No peripancreatic fat stranding or fluid collections. Spleen: Normal size. No mass. Adrenals/Urinary Tract: No discrete adrenal nodules. No hydronephrosis. Simple 1.1 cm renal cyst in the lower left kidney. No additional renal lesions. Normal bladder. Stomach/Bowel:  Grossly normal stomach. Normal caliber small bowel with no small bowel wall thickening. Appendix not discretely visualized. Moderate sigmoid diverticulosis, with no large bowel wall thickening or pericolonic fat stranding. Vascular/Lymphatic: Atherosclerotic nonaneurysmal abdominal aorta. Patent portal, splenic, hepatic and renal veins. No pathologically enlarged lymph nodes in the abdomen or pelvis. Reproductive: Enlarged myomatous uterus with a dominant calcified 7.2 cm uterine fibroid. No adnexal mass . Other: No pneumoperitoneum. Trace free fluid in the right deep pelvis. Musculoskeletal: No aggressive appearing focal osseous lesions. Marked lumbar spondylosis and levocurvature of the lumbar spine. IMPRESSION: 1. 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 Mild main pancreatic duct dilation. A follow-up pancreas protocol MRI (preferred if the patient can breath-hold) or CT abdomen without and with IV contrast is recommended in 12 months. This recommendation follows ACR consensus guidelines: Managing Incidental Findings on Abdominal CT: White Paper of the ACR Incidental Findings Committee. J Am Coll Radiol 2010;7:754-773. 2. Small moderate pericardial effusion/thickening. 3. Cholelithiasis.  No biliary ductal dilatation. 4. Moderate sigmoid diverticulosis. 5. Aortic atherosclerosis. 6. Myomatous uterus. Electronically Signed   By: Delbert Phenix M.D.   On: 02/21/2016 08:29   Dg Chest Port 1 View  02/18/2016  CLINICAL DATA:  82 year old female with a history of difficulty speaking and falling EXAM: PORTABLE CHEST 1 VIEW COMPARISON:  None. FINDINGS: Cardiomediastinal silhouette enlarged. Calcifications of the aortic arch. No evidence of central vascular congestion. Soft tissue density projects over the upper right mediastinal border, crossing  the contours of the clavicular head and upper posterior ribs. Uplifting of the minor fissure No pneumothorax or pleural effusion.  No confluent airspace disease. Surgical changes of prior bilateral reverse  shoulder arthroplasty. No displaced fracture. IMPRESSION: No radiographic evidence of acute cardiopulmonary disease. Soft tissue density of the upper right mediastinal border. There is associated uplifting of the minor fissure. Findings are concerning for right upper lobe partial volume loss, and a central obstructing tumor is not excluded. Further evaluation with contrast-enhanced CT is recommended. These results will be called to the ordering clinician or representative by the Radiologist Assistant, and communication documented in the PACS or zVision Dashboard. Cardiomegaly. Aortic atherosclerosis. Signed, Yvone Neu. Loreta Ave, DO [facility] Electronically Signed   By: Gilmer Mor D.O.   On: 02/18/2016 10:45   Mr Maxine Glenn Head/brain Wo Cm  02/18/2016  CLINICAL DATA:  82 year old female with increasing difficulty with speech and swallowing for 1 week. Facial droop. Increased falls. Initial encounter. EXAM: MRI HEAD WITHOUT CONTRAST MRA HEAD WITHOUT CONTRAST TECHNIQUE: Multiplanar, multiecho pulse sequences of the brain and surrounding structures were obtained without intravenous contrast. Angiographic images of the head were obtained using MRA technique without contrast. COMPARISON:  Noncontrast head CT 02/17/2016 FINDINGS: MRI HEAD FINDINGS Metal susceptibility artifact related to a tiny retained metal foreign body in the scalp (series 3, image 28 of the comparison) along the left posterior convexity mildly degrades diffusion weighted imaging. No convincing restricted diffusion or evidence of acute infarction. Major intracranial vascular flow voids are preserved. Mild intracranial artery dolichoectasia. No midline shift, mass effect, ventriculomegaly,  extra-axial collection or acute intracranial hemorrhage. Cervicomedullary junction and pituitary are within normal limits. Negative for age visualized cervical spine. Normal bone marrow signal. There is prominent focal hyperostosis of the right frontal bone versus a small calcified meningioma (series 14, image 22). Minimal mass effect on the adjacent right frontal lobe with no cerebral edema. Small to moderate area of cortical encephalomalacia along the right peri-Rolandic cortex best seen on series 14, image 12. No chronic cerebral blood products identified. Underlying confluent cerebral white matter T2 and FLAIR hyperintensity, in addition to patchy bilateral similar white matter changes. Deep gray matter nuclei, brainstem, and cerebellum are normal for age. Visible internal auditory structures appear normal. Mastoids are clear. No skullbase abnormality identified. Paranasal sinuses are clear. Postoperative changes to the bilateral globes. Negative scalp soft tissues. MRA HEAD FINDINGS Antegrade flow in the posterior circulation with codominant distal vertebral arteries. Normal PICA origins and no distal vertebral stenosis. Patent vertebrobasilar junction and basilar artery without stenosis. Mild ectasia at the basilar tip. SCA and PCA origins are normal. Left posterior communicating artery is present, the right is diminutive or absent. Tortuous left P1 segment. Bilateral PCA branches are normal. Antegrade flow in both ICA siphons. No siphon stenosis. Normal ophthalmic artery origins. Normal MCA and ACA origins. Tortuous A1 segments. Anterior communicating artery and visualized ACA branches are within normal limits. Mild MCA tortuosity. M1 segments and visualized bilateral MCA branches otherwise are normal. IMPRESSION: 1.  No acute intracranial abnormality. 2. Chronic posterior right MCA infarct with superimposed bilateral white matter changes most suggestive of chronic small vessel disease. 3.  Negative  intracranial MRA aside from mild arterial tortuosity. 4. Focal hyperostosis of the right frontal bone versus small right frontal meningioma, appears inconsequential. Electronically Signed   By: Odessa Fleming M.D.   On: 02/18/2016 08:19        Discharge Exam: Filed Vitals:   02/21/16 0919 02/21/16 1309  BP: 149/80 129/70  Pulse: 81 74  Temp: 98.1 F (36.7 C) 98.5 F (36.9 C)  Resp: 18 15   Filed Vitals:  02/21/16 0246 02/21/16 0509 02/21/16 0919 02/21/16 1309  BP: 148/90 147/79 149/80 129/70  Pulse: 85 74 81 74  Temp: 98.2 F (36.8 C) 98.4 F (36.9 C) 98.1 F (36.7 C) 98.5 F (36.9 C)  TempSrc: Axillary Axillary Oral Axillary  Resp:   18 15  Height:      Weight:      SpO2:  100% 99% 99%    General: Pt is alert, awake, not in acute distress Cardiovascular: RRR, S1/S2 +, no rubs, no gallops Respiratory: CTA bilaterally, no wheezing, no rhonchi Abdominal: Soft, NT, ND, bowel sounds + Extremities: no edema, no cyanosis   The results of significant diagnostics from this hospitalization (including imaging, microbiology, ancillary and laboratory) are listed below for reference.    Significant Diagnostic Studies: Ct Head Wo Contrast  02/17/2016  CLINICAL DATA:  Per pt daughter, c/o slurred speech for a couple of days. Pt family also states the L side of her face has been drooping. Pt has alzheimers and dementia, baseline is confused. Pt answered "76" for how old she was. Pt smiling. EXAM: CT HEAD WITHOUT CONTRAST TECHNIQUE: Contiguous axial images were obtained from the base of the skull through the vertex without intravenous contrast. COMPARISON:  None. FINDINGS: No acute intracranial hemorrhage. No focal mass lesion. No CT evidence of acute infarction. No midline shift or mass effect. No hydrocephalus. Basilar cisterns are patent. There are periventricular and subcortical white matter hypodensities. Generalized cortical atrophy. Paranasal sinuses and mastoid air cells are clear. Orbits  are clear. IMPRESSION: 1. No acute intracranial findings. 2. Marked atrophy and moderate white matter microvascular disease. Electronically Signed   By: Genevive Bi M.D.   On: 02/17/2016 19:13   Ct Chest W Contrast  02/20/2016  CLINICAL DATA:  Abnormal chest x-ray suggesting upper right mediastinal soft tissue density and volume loss. EXAM: CT CHEST WITH CONTRAST TECHNIQUE: Multidetector CT imaging of the chest was performed during intravenous contrast administration. CONTRAST:  80 mL Isovue 300 COMPARISON:  Chest 02/18/2016 FINDINGS: Filling defects are demonstrated in multiple bilateral lower lobe and right upper lobe segmental and subsegmental pulmonary arteries consistent with pulmonary embolus. Low clot burden is present. There is diffuse cardiac enlargement likely representing pre-existing heart failure. Small pericardial effusion. Coronary artery calcifications. Normal caliber thoracic aorta. No dissection. Heterogeneous left thyroid nodule measuring 2.3 cm diameter. Consider follow-up with elective ultrasound. No significant lymphadenopathy in the chest. Scattered fibrosis in the lungs. No focal airspace disease or consolidation. Atelectasis in the lung bases. No obstructing mass lesion is demonstrated. Chest x-ray findings probably relate to vascular structures. No pleural effusions. No pneumothorax. Included portions of the upper abdominal organs demonstrate transverse colon anteriorly. The pancreas is atrophic with mild pancreatic ductal dilatation. Suggestion of a low-attenuation lesion in the pancreatic tail possibly representing a mucinous tumor. Suggest follow-up with CT of the abdomen and pelvis in the elective setting for further evaluation. Degenerative changes in the spine. No destructive bone lesions. Bilateral shoulder arthroplasties. IMPRESSION: Filling defects in multiple bilateral segmental and subsegmental pulmonary arteries consistent with pulmonary embolus. Clot burden slow.  Diffuse cardiac enlargement likely represents pre-existing heart failure. Small pericardial effusion. Left thyroid gland nodule. Dilated pancreatic duct with possible low-attenuation lesion in the pancreatic tail. These results were called by telephone at the time of interpretation on 02/20/2016 at 02:17 Am to Dr. Georgia Dom, who verbally acknowledged these results. Electronically Signed   By: Burman Nieves M.D.   On: 02/20/2016 02:22   Mr Brain Wo Contrast  02/18/2016  CLINICAL DATA:  82 year old female with increasing difficulty with speech and swallowing for 1 week. Facial droop. Increased falls. Initial encounter. EXAM: MRI HEAD WITHOUT CONTRAST MRA HEAD WITHOUT CONTRAST TECHNIQUE: Multiplanar, multiecho pulse sequences of the brain and surrounding structures were obtained without intravenous contrast. Angiographic images of the head were obtained using MRA technique without contrast. COMPARISON:  Noncontrast head CT 02/17/2016 FINDINGS: MRI HEAD FINDINGS Metal susceptibility artifact related to a tiny retained metal foreign body in the scalp (series 3, image 28 of the comparison) along the left posterior convexity mildly degrades diffusion weighted imaging. No convincing restricted diffusion or evidence of acute infarction. Major intracranial vascular flow voids are preserved. Mild intracranial artery dolichoectasia. No midline shift, mass effect, ventriculomegaly, extra-axial collection or acute intracranial hemorrhage. Cervicomedullary junction and pituitary are within normal limits. Negative for age visualized cervical spine. Normal bone marrow signal. There is prominent focal hyperostosis of the right frontal bone versus a small calcified meningioma (series 14, image 22). Minimal mass effect on the adjacent right frontal lobe with no cerebral edema. Small to moderate area of cortical encephalomalacia along the right peri-Rolandic cortex best seen on series 14, image 12. No chronic cerebral blood products  identified. Underlying confluent cerebral white matter T2 and FLAIR hyperintensity, in addition to patchy bilateral similar white matter changes. Deep gray matter nuclei, brainstem, and cerebellum are normal for age. Visible internal auditory structures appear normal. Mastoids are clear. No skullbase abnormality identified. Paranasal sinuses are clear. Postoperative changes to the bilateral globes. Negative scalp soft tissues. MRA HEAD FINDINGS Antegrade flow in the posterior circulation with codominant distal vertebral arteries. Normal PICA origins and no distal vertebral stenosis. Patent vertebrobasilar junction and basilar artery without stenosis. Mild ectasia at the basilar tip. SCA and PCA origins are normal. Left posterior communicating artery is present, the right is diminutive or absent. Tortuous left P1 segment. Bilateral PCA branches are normal. Antegrade flow in both ICA siphons. No siphon stenosis. Normal ophthalmic artery origins. Normal MCA and ACA origins. Tortuous A1 segments. Anterior communicating artery and visualized ACA branches are within normal limits. Mild MCA tortuosity. M1 segments and visualized bilateral MCA branches otherwise are normal. IMPRESSION: 1.  No acute intracranial abnormality. 2. Chronic posterior right MCA infarct with superimposed bilateral white matter changes most suggestive of chronic small vessel disease. 3.  Negative intracranial MRA aside from mild arterial tortuosity. 4. Focal hyperostosis of the right frontal bone versus small right frontal meningioma, appears inconsequential. Electronically Signed   By: Odessa Fleming M.D.   On: 02/18/2016 08:19   Ct Abdomen Pelvis W Contrast  02/21/2016  CLINICAL DATA:  82 year old female inpatient with possible pancreatic on chest CT. EXAM: CT ABDOMEN AND PELVIS WITH CONTRAST TECHNIQUE: Multidetector CT imaging of the abdomen and pelvis was performed using the standard protocol following bolus administration of intravenous contrast.  CONTRAST:  100 cc Isovue 370 IV. COMPARISON:  02/19/2016 chest CT. FINDINGS: Motion degraded scan. Lower chest: No significant pulmonary nodules or acute consolidative airspace disease. Small to moderate pericardial effusion/thickening measuring up to the 13 mm thickness anteriorly. Hepatobiliary: Normal liver size. No liver mass. Small subcapsular focus of arterial phase hyperenhancement in the far inferior right liver is occult on other sequences, consistent with transient hepatic attenuation difference. Probable 9 mm layering gallstone in the gallbladder surrounded by vicarious excretion of IV contrast within the gallbladder lumen, with no gallbladder wall thickening or pericholecystic fluid. No biliary ductal dilatation. Pancreas: There are multiple (at least 5) similar-appearing low-attenuation  lesions scattered throughout the pancreatic head and tail, largest 1.6 x 0.6 x 0.9 cm in the uncinate process (series 11/ image 71) and 1.1 x 0.7 x 1.2 cm in the pancreatic tail (series 20/image 68), which demonstrate a lobulated contour and no appreciable enhancement. There is mild diffuse main pancreatic duct dilation (3 mm diameter). No peripancreatic fat stranding or fluid collections. Spleen: Normal size. No mass. Adrenals/Urinary Tract: No discrete adrenal nodules. No hydronephrosis. Simple 1.1 cm renal cyst in the lower left kidney. No additional renal lesions. Normal bladder. Stomach/Bowel: Grossly normal stomach. Normal caliber small bowel with no small bowel wall thickening. Appendix not discretely visualized. Moderate sigmoid diverticulosis, with no large bowel wall thickening or pericolonic fat stranding. Vascular/Lymphatic: Atherosclerotic nonaneurysmal abdominal aorta. Patent portal, splenic, hepatic and renal veins. No pathologically enlarged lymph nodes in the abdomen or pelvis. Reproductive: Enlarged myomatous uterus with a dominant calcified 7.2 cm uterine fibroid. No adnexal mass . Other: No  pneumoperitoneum. Trace free fluid in the right deep pelvis. Musculoskeletal: No aggressive appearing focal osseous lesions. Marked lumbar spondylosis and levocurvature of the lumbar spine. IMPRESSION: 1. 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 Mild main pancreatic duct dilation. A follow-up pancreas protocol MRI (preferred if the patient can breath-hold) or CT abdomen without and with IV contrast is recommended in 12 months. This recommendation follows ACR consensus guidelines: Managing Incidental Findings on Abdominal CT: White Paper of the ACR Incidental Findings Committee. J Am Coll Radiol 2010;7:754-773. 2. Small moderate pericardial effusion/thickening. 3. Cholelithiasis.  No biliary ductal dilatation. 4. Moderate sigmoid diverticulosis. 5. Aortic atherosclerosis. 6. Myomatous uterus. Electronically Signed   By: Delbert Phenix M.D.   On: 02/21/2016 08:29   Dg Chest Port 1 View  02/18/2016  CLINICAL DATA:  82 year old female with a history of difficulty speaking and falling EXAM: PORTABLE CHEST 1 VIEW COMPARISON:  None. FINDINGS: Cardiomediastinal silhouette enlarged. Calcifications of the aortic arch. No evidence of central vascular congestion. Soft tissue density projects over the upper right mediastinal border, crossing the contours of the clavicular head and upper posterior ribs. Uplifting of the minor fissure No pneumothorax or pleural effusion.  No confluent airspace disease. Surgical changes of prior bilateral reverse shoulder arthroplasty. No displaced fracture. IMPRESSION: No radiographic evidence of acute cardiopulmonary disease. Soft tissue density of the upper right mediastinal border. There is associated uplifting of the minor fissure. Findings are concerning for right upper lobe partial volume loss, and a central  obstructing tumor is not excluded. Further evaluation with contrast-enhanced CT is recommended. These results will be called to the ordering clinician or representative by the Radiologist Assistant, and communication documented in the PACS or zVision Dashboard. Cardiomegaly. Aortic atherosclerosis. Signed, Yvone Neu. Loreta Ave, DO [facility] Radiology Electronically Signed   By: Gilmer Mor D.O.   On: 02/18/2016 10:45   Mr Maxine Glenn Head/brain Wo Cm  02/18/2016  CLINICAL DATA:  82 year old female with increasing difficulty with speech and swallowing for 1 week. Facial droop. Increased falls. Initial encounter. EXAM: MRI HEAD WITHOUT CONTRAST MRA HEAD WITHOUT CONTRAST TECHNIQUE: Multiplanar, multiecho pulse sequences of the brain and surrounding structures were obtained without intravenous contrast. Angiographic images of the head were obtained using MRA technique without contrast. COMPARISON:  Noncontrast head CT 02/17/2016 FINDINGS: MRI HEAD FINDINGS Metal susceptibility artifact related to a tiny retained metal foreign body in the scalp (series 3, image  28 of the comparison) along the left posterior convexity mildly degrades diffusion weighted imaging. No convincing restricted diffusion or evidence of acute infarction. Major intracranial vascular flow voids are preserved. Mild intracranial artery dolichoectasia. No midline shift, mass effect, ventriculomegaly, extra-axial collection or acute intracranial hemorrhage. Cervicomedullary junction and pituitary are within normal limits. Negative for age visualized cervical spine. Normal bone marrow signal. There is prominent focal hyperostosis of the right frontal bone versus a small calcified meningioma (series 14, image 22). Minimal mass effect on the adjacent right frontal lobe with no cerebral edema. Small to moderate area of cortical encephalomalacia along the right peri-Rolandic cortex best seen on series 14, image 12. No  chronic cerebral blood products identified. Underlying confluent cerebral white matter T2 and FLAIR hyperintensity, in addition to patchy bilateral similar white matter changes. Deep gray matter nuclei, brainstem, and cerebellum are normal for age. Visible internal auditory structures appear normal. Mastoids are clear. No skullbase abnormality identified. Paranasal sinuses are clear. Postoperative changes to the bilateral globes. Negative scalp soft tissues. MRA HEAD FINDINGS Antegrade flow in the posterior circulation with codominant distal vertebral arteries. Normal PICA origins and no distal vertebral stenosis. Patent vertebrobasilar junction and basilar artery without stenosis. Mild ectasia at the basilar tip. SCA and PCA origins are normal. Left posterior communicating artery is present, the right is diminutive or absent. Tortuous left P1 segment. Bilateral PCA branches are normal. Antegrade flow in both ICA siphons. No siphon stenosis. Normal ophthalmic artery origins. Normal MCA and ACA origins. Tortuous A1 segments. Anterior communicating artery and visualized ACA branches are within normal limits. Mild MCA tortuosity. M1 segments and visualized bilateral MCA branches otherwise are normal. IMPRESSION: 1.  No acute intracranial abnormality. 2. Chronic posterior right MCA infarct with superimposed bilateral white matter changes most suggestive of chronic small vessel disease. 3.  Negative intracranial MRA aside from mild arterial tortuosity. 4. Focal hyperostosis of the right frontal bone versus small right frontal meningioma, appears inconsequential. Electronically Signed   By: Odessa Fleming M.D.   On: 02/18/2016 08:19     Microbiology: Recent Results (from the past 240 hour(s))  Urine culture     Status: Abnormal   Collection Time: 02/17/16  9:15 PM  Result Value Ref Range Status   Specimen Description URINE, RANDOM  Final   Special Requests NONE  Final   Culture MULTIPLE SPECIES PRESENT, SUGGEST  RECOLLECTION (A)  Final   Report Status 02/19/2016 FINAL  Final     Labs: Basic Metabolic Panel:  Recent Labs Lab 02/17/16 1746 02/17/16 1818 02/19/16 0316 02/20/16 0515  NA 138 141 140 142  K 3.5 3.5 3.0* 4.0  CL 109 106 106 110  CO2 22  --  25 24  GLUCOSE 93 92 112* 100*  BUN 14 16 10 8   CREATININE 1.19* 1.20* 1.06* 0.94  CALCIUM 9.4  --  9.5 9.3  MG  --   --   --  1.6*   Liver Function Tests:  Recent Labs Lab 02/17/16 1746  AST 19  ALT 7*  ALKPHOS 46  BILITOT 0.3  PROT 6.4*  ALBUMIN 3.5   No results for input(s): LIPASE, AMYLASE in the last 168 hours.  Recent Labs Lab 02/18/16 1447  AMMONIA 22   CBC:  Recent Labs Lab 02/17/16 1746 02/17/16 1818 02/18/16 1447 02/20/16 0515 02/21/16 0632  WBC 7.0  --   --  9.2 8.9  NEUTROABS 4.8  --   --   --   --  HGB 11.9* 12.2  --  13.2 14.2  HCT 36.1 36.0 38.3 39.9 42.8  MCV 88.5  --   --  87.9 87.5  PLT 157  --   --  172 215   Cardiac Enzymes: No results for input(s): CKTOTAL, CKMB, CKMBINDEX, TROPONINI in the last 168 hours. BNP: Invalid input(s): POCBNP CBG:  Recent Labs Lab 02/20/16 1118 02/20/16 1915 02/21/16 0046 02/21/16 0614 02/21/16 1120  GLUCAP 105* 125* 114* 110* 90    Time coordinating discharge:  Greater than 30 minutes  Signed:  TAT, DAVID, DO [facility] Pager: [phone] 02/21/2016, 4:49 PM

## 2016-02-21 NOTE — Progress Notes (Signed)
ANTICOAGULATION CONSULT NOTE - Follow Up Consult  Pharmacy Consult for apixaban Indication: PE  No Known Allergies  Patient Measurements: Height: 5\' 3"  (160 cm) Weight: 113 lb 4.8 oz (51.393 kg) IBW/kg (Calculated) : 52.4 Heparin Dosing Weight:  51.4 kg  Vital Signs: Temp: 98.5 F (36.9 C) (07/11 1309) Temp Source: Axillary (07/11 1309) BP: 129/70 mmHg (07/11 1309) Pulse Rate: 74 (07/11 1309)  Labs:  Recent Labs  02/19/16 0316 02/20/16 0515 02/20/16 1258 02/20/16 2051 02/21/16 0632  HGB  --  13.2  --   --  14.2  HCT  --  39.9  --   --  42.8  PLT  --  172  --   --  215  HEPARINUNFRC  --   --  0.24* 0.58 0.65  CREATININE 1.06* 0.94  --   --   --     Estimated Creatinine Clearance: 33.6 mL/min (by C-G formula based on Cr of 0.94).  Assessment: 82 y/o F here with worsening speech difficulties, swallowing difficulties, facial drooping, and falls x 2 for the last one week undergoing stroke work-up (MRI negative, MRA with mild to mod stenosis), CT chest was done to rule out malignancy but incidentally found NEW ONSET PE.   Pharmacy consulted to transition from IV heparin to apixaban. Hgb and platelets are stable, no bleeding noted.   Goal of Therapy:  full anticoagulation Monitor platelets by anticoagulation protocol: Yes   Plan:  Discontinue IV heparin at the time the 1st dose of apixaban given - confirmed plan with RN Apixaban 10 mg PO bid for 7 days followed by 5 mg PO bid Education to be completed prior to discharge Pharmacy will monitor peripherally for bleeding issues but is signing off  Loura BackJennifer Haskell, PharmD, BCPS Clinical Pharmacist Pager: [phone] 02/21/2016 4:34 PM

## 2016-02-21 NOTE — Progress Notes (Addendum)
ANTICOAGULATION CONSULT NOTE - Follow Up Consult  Pharmacy Consult for Heparin Indication: Atrial Fibrillation   No Known Allergies  Patient Measurements: Height: 5\' 3"  (160 cm) Weight: 113 lb 4.8 oz (51.393 kg) IBW/kg (Calculated) : 52.4 Heparin Dosing Weight:  51.4 kg  Vital Signs: Temp: 98.4 F (36.9 C) (07/11 0509) Temp Source: Axillary (07/11 0509) BP: 147/79 mmHg (07/11 0509) Pulse Rate: 74 (07/11 0509)  Labs:  Recent Labs  02/18/16 1447 02/19/16 0316 02/20/16 0515 02/20/16 1258 02/20/16 2051 02/21/16 0632  HGB  --   --  13.2  --   --  14.2  HCT 38.3  --  39.9  --   --  42.8  PLT  --   --  172  --   --  215  HEPARINUNFRC  --   --   --  0.24* 0.58 0.65  CREATININE  --  1.06* 0.94  --   --   --     Estimated Creatinine Clearance: 33.6 mL/min (by C-G formula based on Cr of 0.94).  Assessment: 82 y/o F here with worsening speech difficulties, swallowing difficulties, facial drooping, and falls x 2 for the last one week.  undergoing stroke work-up (MRI negative, MRA with mild to mod stenosis), CT chest was done to rule out malignancy but incidentally found NEW ONSET PE. Hgb increased to 14.2, renal function OK, SCr down trending, baseline INR is normal.  Anticoagulation: New onset PE. CBC wNL. Patient fallen twice this past week  Heparin level 0.65, now at goal on heparin 650 units/hr.  NO bleeding or complications noted.  Goal of Therapy:  Heparin level 0.3-0.7 units/ml Monitor platelets by anticoagulation protocol: Yes   Plan:  Continue IV heparin 650 units/hr Daily heparin level and CBC  F/u plans for oral anticoagulation.  Allena Katzaroline E Welles, PharmD 02/21/2016 9:26 AM   I discussed / reviewed the pharmacy note by Dr. Mariam DollarWelles and I agree with the resident's findings and plans as documented.  Crystal S. Merilynn Finlandobertson, PharmD, BCPS Clinical Staff Pharmacist Pager [phone]

## 2016-02-21 NOTE — Consult Note (Signed)
[facility] CONSULT Reason for consult: Abnormal CT of Pancreas Referring Physician: [facility]. PCP: unknown patient will be going to facility on discharge. Primary G.I.: none patient unassigned  Roberta Griffin is an 81 y.o. female.  HPI: she was admitted with slurred speech and falling. She has a history dementia hypertension. She was felt to be having a stroke. She had a CT scan of the chest indicating pulmonary emboli and suggested a lesion in the tale of the pancreas. CT of the pancreas showed multiple low attenuation lesions throughout the entire pancreas largest about 1 cm consistent with IPMNs dilated pancreatic duct. There were no solid masses. The patient has no knowledge of pancreatic lesions the states that she is not having any abdominal pain. She is currently on heparin infusion for stroke and pulmonary embolus.  Past Medical History  Diagnosis Date  . Scoliosis   . Dementia   . COPD (chronic obstructive pulmonary disease) ([place])   . Hypertension   . Dysphagia   . Depression     Past Surgical History  Procedure Laterality Date  . Shoulder surgery    . Knee surgery    . Abdominal hysterectomy    . Vitrectomy and cataract      Family History  Problem Relation Age of Onset  . Stroke Neg Hx     Social History:  reports that she has never smoked. She does not have any smokeless tobacco history on file. She reports that she does not drink alcohol or use illicit drugs.  Allergies: No Known Allergies  Medications; Prior to Admission medications   Medication Sig Start Date End Date Taking? Authorizing Provider  donepezil (ARICEPT) 10 MG tablet Take 1 tablet (10 mg total) by mouth at bedtime. 02/09/16  Yes Merrily Pew, MD  meloxicam (MOBIC) 7.5 MG tablet Take 1 tablet (7.5 mg total) by mouth daily. 02/09/16  Yes Merrily Pew, MD  metoprolol succinate (TOPROL-XL) 50 MG 24 hr tablet Take 1 tablet (50 mg total) by mouth daily. Take with or immediately  following a meal. 02/09/16  Yes Merrily Pew, MD  NIFEdipine (PROCARDIA XL/ADALAT-CC) 90 MG 24 hr tablet Take 1 tablet (90 mg total) by mouth daily. 02/09/16  Yes Merrily Pew, MD  risperiDONE (RISPERDAL) 1 MG tablet Take 1 mg by mouth 2 (two) times daily. 02/10/16  Yes Historical Provider, MD  aspirin EC 81 MG tablet Take 1 tablet (81 mg total) by mouth daily. 02/19/16   Orson Eva, MD  lidocaine (LIDODERM) 5 % Place 1 patch onto the skin daily. Remove & Discard patch within 12 hours or as directed by MD 02/09/16   Merrily Pew, MD  Misc. Devices (3-IN-1 BEDSIDE TOILET) MISC 1 Units by Does not apply route once. 02/09/16   Merrily Pew, MD  pravastatin (PRAVACHOL) 20 MG tablet Take 1 tablet (20 mg total) by mouth daily at 6 PM. 02/20/16   Orson Eva, MD  traZODone (DESYREL) 50 MG tablet Take 1 tablet (50 mg total) by mouth at bedtime. 02/19/16   Orson Eva, MD   . aspirin  300 mg Rectal Daily   Or  . aspirin  325 mg Oral Daily  . donepezil  10 mg Oral QHS  . metoprolol succinate  50 mg Oral Daily  . pravastatin  20 mg Oral q1800  . risperiDONE  1 mg Oral BID  . traZODone  50 mg Oral QHS   PRN Meds haloperidol lactate, iopamidol, labetalol Results for orders placed or performed during the hospital encounter  of 02/17/16 (from the past 48 hour(s))  Glucose, capillary     Status: Abnormal   Collection Time: 02/20/16  1:03 AM  Result Value Ref Range   Glucose-Capillary 126 (H) 65 - 99 mg/dL   Comment 1 Notify RN    Comment 2 Document in Chart   Basic metabolic panel     Status: Abnormal   Collection Time: 02/20/16  5:15 AM  Result Value Ref Range   Sodium 142 135 - 145 mmol/L   Potassium 4.0 3.5 - 5.1 mmol/L    Comment: DELTA CHECK NOTED   Chloride 110 101 - 111 mmol/L   CO2 24 22 - 32 mmol/L   Glucose, Bld 100 (H) 65 - 99 mg/dL   BUN 8 6 - 20 mg/dL   Creatinine, Ser 0.94 0.44 - 1.00 mg/dL   Calcium 9.3 8.9 - 10.3 mg/dL   GFR calc non Af Amer 53 (L) >60 mL/min   GFR calc Af Amer >60 >60 mL/min     Comment: (NOTE) The eGFR has been calculated using the CKD EPI equation. This calculation has not been validated in all clinical situations. eGFR's persistently <60 mL/min signify possible Chronic Kidney Disease.    Anion gap 8 5 - 15  Magnesium     Status: Abnormal   Collection Time: 02/20/16  5:15 AM  Result Value Ref Range   Magnesium 1.6 (L) 1.7 - 2.4 mg/dL  CBC     Status: None   Collection Time: 02/20/16  5:15 AM  Result Value Ref Range   WBC 9.2 4.0 - 10.5 K/uL   RBC 4.54 3.87 - 5.11 MIL/uL   Hemoglobin 13.2 12.0 - 15.0 g/dL   HCT 39.9 36.0 - 46.0 %   MCV 87.9 78.0 - 100.0 fL   MCH 29.1 26.0 - 34.0 pg   MCHC 33.1 30.0 - 36.0 g/dL   RDW 12.7 11.5 - 15.5 %   Platelets 172 150 - 400 K/uL  Glucose, capillary     Status: None   Collection Time: 02/20/16  5:46 AM  Result Value Ref Range   Glucose-Capillary 82 65 - 99 mg/dL   Comment 1 Notify RN    Comment 2 Document in Chart   Glucose, capillary     Status: Abnormal   Collection Time: 02/20/16 11:18 AM  Result Value Ref Range   Glucose-Capillary 105 (H) 65 - 99 mg/dL  Heparin level (unfractionated)     Status: Abnormal   Collection Time: 02/20/16 12:58 PM  Result Value Ref Range   Heparin Unfractionated 0.24 (L) 0.30 - 0.70 IU/mL    Comment:        IF HEPARIN RESULTS ARE BELOW EXPECTED VALUES, AND PATIENT DOSAGE HAS BEEN CONFIRMED, SUGGEST FOLLOW UP TESTING OF ANTITHROMBIN III LEVELS.   Glucose, capillary     Status: Abnormal   Collection Time: 02/20/16  7:15 PM  Result Value Ref Range   Glucose-Capillary 125 (H) 65 - 99 mg/dL  Heparin level (unfractionated)     Status: None   Collection Time: 02/20/16  8:51 PM  Result Value Ref Range   Heparin Unfractionated 0.58 0.30 - 0.70 IU/mL    Comment:        IF HEPARIN RESULTS ARE BELOW EXPECTED VALUES, AND PATIENT DOSAGE HAS BEEN CONFIRMED, SUGGEST FOLLOW UP TESTING OF ANTITHROMBIN III LEVELS.   Glucose, capillary     Status: Abnormal   Collection Time:  02/21/16 12:46 AM  Result Value Ref Range   Glucose-Capillary 114 (H)  65 - 99 mg/dL   Comment 1 Notify RN    Comment 2 Document in Chart   Glucose, capillary     Status: Abnormal   Collection Time: 02/21/16  6:14 AM  Result Value Ref Range   Glucose-Capillary 110 (H) 65 - 99 mg/dL   Comment 1 Notify RN    Comment 2 Document in Chart   CBC     Status: None   Collection Time: 02/21/16  6:32 AM  Result Value Ref Range   WBC 8.9 4.0 - 10.5 K/uL   RBC 4.89 3.87 - 5.11 MIL/uL   Hemoglobin 14.2 12.0 - 15.0 g/dL   HCT 42.8 36.0 - 46.0 %   MCV 87.5 78.0 - 100.0 fL   MCH 29.0 26.0 - 34.0 pg   MCHC 33.2 30.0 - 36.0 g/dL   RDW 12.7 11.5 - 15.5 %   Platelets 215 150 - 400 K/uL  Heparin level (unfractionated)     Status: None   Collection Time: 02/21/16  6:32 AM  Result Value Ref Range   Heparin Unfractionated 0.65 0.30 - 0.70 IU/mL    Comment:        IF HEPARIN RESULTS ARE BELOW EXPECTED VALUES, AND PATIENT DOSAGE HAS BEEN CONFIRMED, SUGGEST FOLLOW UP TESTING OF ANTITHROMBIN III LEVELS.     Ct Chest W Contrast  02/20/2016  CLINICAL DATA:  Abnormal chest x-ray suggesting upper right mediastinal soft tissue density and volume loss. EXAM: CT CHEST WITH CONTRAST TECHNIQUE: Multidetector CT imaging of the chest was performed during intravenous contrast administration. CONTRAST:  80 mL Isovue 300 COMPARISON:  Chest 02/18/2016 FINDINGS: Filling defects are demonstrated in multiple bilateral lower lobe and right upper lobe segmental and subsegmental pulmonary arteries consistent with pulmonary embolus. Low clot burden is present. There is diffuse cardiac enlargement likely representing pre-existing heart failure. Small pericardial effusion. Coronary artery calcifications. Normal caliber thoracic aorta. No dissection. Heterogeneous left thyroid nodule measuring 2.3 cm diameter. Consider follow-up with elective ultrasound. No significant lymphadenopathy in the chest. Scattered fibrosis in the lungs. No  focal airspace disease or consolidation. Atelectasis in the lung bases. No obstructing mass lesion is demonstrated. Chest x-ray findings probably relate to vascular structures. No pleural effusions. No pneumothorax. Included portions of the upper abdominal organs demonstrate transverse colon anteriorly. The pancreas is atrophic with mild pancreatic ductal dilatation. Suggestion of a low-attenuation lesion in the pancreatic tail possibly representing a mucinous tumor. Suggest follow-up with CT of the abdomen and pelvis in the elective setting for further evaluation. Degenerative changes in the spine. No destructive bone lesions. Bilateral shoulder arthroplasties. IMPRESSION: Filling defects in multiple bilateral segmental and subsegmental pulmonary arteries consistent with pulmonary embolus. Clot burden slow. Diffuse cardiac enlargement likely represents pre-existing heart failure. Small pericardial effusion. Left thyroid gland nodule. Dilated pancreatic duct with possible low-attenuation lesion in the pancreatic tail. These results were called by telephone at the time of interpretation on 02/20/2016 at 02:17 Am to Dr. Rachelle Hora, who verbally acknowledged these results. Electronically Signed   By: Lucienne Capers M.D.   On: 02/20/2016 02:22   Ct Abdomen Pelvis W Contrast  02/21/2016  CLINICAL DATA:  82 year old female inpatient with possible pancreatic on chest CT. EXAM: CT ABDOMEN AND PELVIS WITH CONTRAST TECHNIQUE: Multidetector CT imaging of the abdomen and pelvis was performed using the standard protocol following bolus administration of intravenous contrast. CONTRAST:  100 cc Isovue 370 IV. COMPARISON:  02/19/2016 chest CT. FINDINGS: Motion degraded scan. Lower chest: No significant pulmonary nodules or acute  consolidative airspace disease. Small to moderate pericardial effusion/thickening measuring up to the 13 mm thickness anteriorly. Hepatobiliary: Normal liver size. No liver mass. Small subcapsular focus of  arterial phase hyperenhancement in the far inferior right liver is occult on other sequences, consistent with transient hepatic attenuation difference. Probable 9 mm layering gallstone in the gallbladder surrounded by vicarious excretion of IV contrast within the gallbladder lumen, with no gallbladder wall thickening or pericholecystic fluid. No biliary ductal dilatation. Pancreas: There are multiple (at least 5) similar-appearing low-attenuation lesions scattered throughout the pancreatic head and tail, largest 1.6 x 0.6 x 0.9 cm in the uncinate process (series 11/ image 71) and 1.1 x 0.7 x 1.2 cm in the pancreatic tail (series 20/image 68), which demonstrate a lobulated contour and no appreciable enhancement. There is mild diffuse main pancreatic duct dilation (3 mm diameter). No peripancreatic fat stranding or fluid collections. Spleen: Normal size. No mass. Adrenals/Urinary Tract: No discrete adrenal nodules. No hydronephrosis. Simple 1.1 cm renal cyst in the lower left kidney. No additional renal lesions. Normal bladder. Stomach/Bowel: Grossly normal stomach. Normal caliber small bowel with no small bowel wall thickening. Appendix not discretely visualized. Moderate sigmoid diverticulosis, with no large bowel wall thickening or pericolonic fat stranding. Vascular/Lymphatic: Atherosclerotic nonaneurysmal abdominal aorta. Patent portal, splenic, hepatic and renal veins. No pathologically enlarged lymph nodes in the abdomen or pelvis. Reproductive: Enlarged myomatous uterus with a dominant calcified 7.2 cm uterine fibroid. No adnexal mass . Other: No pneumoperitoneum. Trace free fluid in the right deep pelvis. Musculoskeletal: No aggressive appearing focal osseous lesions. Marked lumbar spondylosis and levocurvature of the lumbar spine. IMPRESSION: 1. Multiple similar small low-attenuation pancreatic lesions scattered in the pancreatic head and tail without appreciable enhancement, consistent with nonaggressive  cystic pancreatic lesions, 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 Mild main pancreatic duct dilation. A follow-up pancreas protocol MRI (preferred if the patient can breath-hold) or CT abdomen without and with IV contrast is recommended in 12 months. This recommendation follows ACR consensus guidelines: Managing Incidental Findings on Abdominal CT: White Paper of the ACR Incidental Findings Committee. J Am Coll Radiol 2010;7:754-773. 2. Small moderate pericardial effusion/thickening. 3. Cholelithiasis.  No biliary ductal dilatation. 4. Moderate sigmoid diverticulosis. 5. Aortic atherosclerosis. 6. Myomatous uterus. Electronically Signed   By: Ilona Sorrel M.D.   On: 02/21/2016 08:29               Blood pressure 149/80, pulse 81, temperature 98.1 F (36.7 C), temperature source Oral, resp. rate 18, height 5' 3"  (1.6 m), weight 51.393 kg (113 lb 4.8 oz), SpO2 99 %.  Physical exam:   General-- pleasantly confused white female ENT-- nonicteric  Heart-- regular rate and rhythm without murmurs are gallops Lungs-- clear Abdomen-- soft and nontender no masses palpated    Assessment: 1. Cystic lesions of the pancreas. There are multiple lesions in their consistent with IPMNs. These have a fairly low potential for malignancy. There no solid masses of the pancreas. It is recommended that these be followed up with repeat scanning in about one year. If that one of them appears to be enlarging biopsy could be considered at that time if clinically indicated based on all of her other problems. 2. Recent stroke 3 pulmonary emboli on anticoagulation 4. Dementia 5. Gallstones asymptomatic  Plan: do not feel she needs any further diagnostic workup at this time. Would suggest that the physicians caring for her consider repeat imaging of the pancreas in one year if clinically indicated. Please feel free to call us  for any further problems.   EDWARDS JR,JAMES L 02/21/2016, 12:42 PM   This note was created  using voice recognition software and minor errors may Have occurred unintentionally. Pager: [phone] If no answer or after hours call [phone]

## 2016-02-22 DIAGNOSIS — E785 Hyperlipidemia, unspecified: Secondary | ICD-10-CM

## 2016-02-22 LAB — BASIC METABOLIC PANEL
Anion gap: 8 (ref 5–15)
BUN: 11 mg/dL (ref 6–20)
CO2: 22 mmol/L (ref 22–32)
Calcium: 9.1 mg/dL (ref 8.9–10.3)
Chloride: 110 mmol/L (ref 101–111)
Creatinine, Ser: 1.02 mg/dL — ABNORMAL HIGH (ref 0.44–1.00)
GFR calc Af Amer: 55 mL/min — ABNORMAL LOW (ref >60)
GFR calc non Af Amer: 48 mL/min — ABNORMAL LOW (ref >60)
Glucose, Bld: 85 mg/dL (ref 65–99)
Potassium: 4.1 mmol/L (ref 3.5–5.1)
Sodium: 140 mmol/L (ref 135–145)

## 2016-02-22 LAB — MAGNESIUM: Magnesium: 2.1 mg/dL (ref 1.7–2.4)

## 2016-02-22 LAB — GLUCOSE, CAPILLARY
Glucose-Capillary: 93 mg/dL (ref 65–99)
Glucose-Capillary: 89 mg/dL (ref 65–99)

## 2016-02-22 MED ORDER — HALOPERIDOL LACTATE 5 MG/ML IJ SOLN
1.0000 mg | Freq: Four times a day (QID) | INTRAMUSCULAR | Status: DC | PRN
  Administered 2016-02-23: 1 mg via INTRAVENOUS
  Filled 2016-02-22: qty 1

## 2016-02-22 NOTE — Discharge Instructions (Addendum)
Information on my medicine - ELIQUIS (apixaban)  This medication education was reviewed with me or my healthcare representative as part of my discharge preparation.  The pharmacist that spoke with me during my hospital stay was:  Pasty Spillers, [facility]  Why was Eliquis prescribed for you? Eliquis was prescribed to treat blood clots that may have been found in the veins of your legs (deep vein thrombosis) or in your lungs (pulmonary embolism) and to reduce the risk of them occurring again.  What do You need to know about Eliquis ? The starting dose is 10 mg (two 5 mg tablets) taken TWICE daily for the FIRST SEVEN (7) DAYS, then on (enter date)  02/29/16  the dose is reduced to ONE 5 mg tablet taken TWICE daily.  Eliquis may be taken with or without food.   Try to take the dose about the same time in the morning and in the evening. If you have difficulty swallowing the tablet whole please discuss with your pharmacist how to take the medication safely.  Take Eliquis exactly as prescribed and DO NOT stop taking Eliquis without talking to the doctor who prescribed the medication.  Stopping may increase your risk of developing a new blood clot.  Refill your prescription before you run out.  After discharge, you should have regular check-up appointments with your healthcare provider that is prescribing your Eliquis.    What do you do if you miss a dose? If a dose of ELIQUIS is not taken at the scheduled time, take it as soon as possible on the same day and twice-daily administration should be resumed. The dose should not be doubled to make up for a missed dose.  Important Safety Information A possible side effect of Eliquis is bleeding. You should call your healthcare provider right away if you experience any of the following: ? Bleeding from an injury or your nose that does not stop. ? Unusual colored urine (red or dark brown) or unusual colored stools (red or black). ? Unusual  bruising for unknown reasons. ? A serious fall or if you hit your head (even if there is no bleeding).  Some medicines may interact with Eliquis and might increase your risk of bleeding or clotting while on Eliquis. To help avoid this, consult your healthcare provider or pharmacist prior to using any new prescription or non-prescription medications, including herbals, vitamins, non-steroidal anti-inflammatory drugs (NSAIDs) and supplements.  This website has more information on Eliquis (apixaban): http://www.eliquis.com/eliquis/home   Pulmonary Embolism A pulmonary embolism (PE) is a sudden blockage or decrease of blood flow in one lung or both lungs. Most blockages come from a blood clot that travels from the legs or the pelvis to the lungs. PE is a dangerous and potentially life-threatening condition if it is not treated right away. CAUSES A pulmonary embolism occurs most commonly when a blood clot travels from one of your veins to your lungs. Rarely, PE is caused by air, fat, amniotic fluid, or part of a tumor traveling through your veins to your lungs. RISK FACTORS A PE is more likely to develop in:  People who smoke.  People who areolder, especially over 72 years of age.  People who are overweight (obese).  People who sit or lie still for a long time, such as during long-distance travel (over 4 hours), bed rest, hospitalization, or during recovery from certain medical conditions like a stroke.  People who do not engage in much physical activity (sedentary lifestyle).  People who have  chronic breathing disorders.  People whohave a personal or family history of blood clots or blood clotting disease.  People whohave peripheral vascular disease (PVD), diabetes, or some types of cancer.  People who haveheart disease, especially if the person had a recent heart attack or has congestive heart failure.  People who have neurological diseases that affect the legs (leg  paresis).  People who have had a traumatic injury, such as breaking a hip or leg.  People whohave recently had major or lengthy surgery, especially on the hip, knee, or abdomen.  People who have hada central line placed inside a large vein.  People who takemedicines that contain the hormone estrogen. These include birth control pills and hormone replacement therapy.  Pregnancy or during childbirth or the postpartum period. SIGNS AND SYMPTOMS  The symptoms of a PE usually start suddenly and include:  Shortness of breath while active or at rest.  Coughing or coughing up blood or blood-tinged mucus.  Chest pain that is often worse with deep breaths.  Rapid or irregular heartbeat.  Feeling light-headed or dizzy.  Fainting.  Feelinganxious.  Sweating. There may also be pain and swelling in a leg if that is where the blood clot started. These symptoms may represent a serious problem that is an emergency. Do not wait to see if the symptoms will go away. Get medical help right away. Call your local emergency services (911 in the [place] Do not drive yourself to the hospital. DIAGNOSIS Your health care provider will take a medical history and perform a physical exam. You may also have other tests, including:  Blood tests to assess the clotting properties of your blood, assess oxygen levels in your blood, and find blood clots.  Imaging tests, such as CT, ultrasound, MRI, X-ray, and other tests to see if you have clots anywhere in your body.  An electrocardiogram (ECG) to look for heart strain from blood clots in the lungs. TREATMENT The main goals of PE treatment are:  To stop a blood clot from growing larger.  To stop new blood clots from forming. The type of treatment that you receive depends on many factors, such as the cause of your PE, your risk for bleeding or developing more clots, and other medical conditions that you have. Sometimes, a combination of treatments is  necessary. This condition may be treated with:  Medicines, including newer oral blood thinners (anticoagulants), warfarin, low molecular weight heparins, thrombolytics, or heparins.  Wearing compression stockings or using different types of devices.  Surgery (rare) to remove the blood clot or to place a filter in your abdomen to stop the blood clot from traveling to your lungs. Treatments for a PE are often divided into immediate treatment, long-term treatment (up to 3 months after PE), and extended treatment (more than 3 months after PE). Your treatment may continue for several months. This is called maintenance therapy, and it is used to prevent the forming of new blood clots. You can work with your health care provider to choose the treatment program that is best for you. What are anticoagulants? Anticoagulants are medicines that treat PEs. They can stop current blood clots from growing and stop new clots from forming. They cannot dissolve existing clots. Your body dissolves clots by itself over time. Anticoagulants are given by mouth, by injection, or through an IV tube. What are thrombolytics? Thrombolytics are clot-dissolving medicines that are used to dissolve a PE. They carry a high risk of bleeding, so they tend to be used  only in severe cases or if you have very low blood pressure. HOME CARE INSTRUCTIONS If you are taking a newer oral anticoagulant:  Take the medicine every single day at the same time each day.  Understand what foods and drugs interact with this medicine.  Understand that there are no regular blood tests required when using this medicine.  Understandthe side effects of this medicine, including excessive bruising or bleeding. Ask your health care provider or pharmacist about other possible side effects. If you are taking warfarin:  Understand how to take warfarin and know which foods can affect how warfarin works in [employer].  Understand that it is dangerous  to taketoo much or too little warfarin. Too much warfarin increases the risk of bleeding. Too little warfarin continues to allow the risk for blood clots.  Follow your PT and INR blood testing schedule. The PT and INR results allow your health care provider to adjust your dose of warfarin. It is very important that you have your PT and INR tested as often as told by your health care provider.  Avoid major changes in your diet, or tell your health care provider before you change your diet. Arrange a visit with a registered dietitian to answer your questions. Many foods, especially foods that are high in vitamin K, can interfere with warfarin and affect the PT and INR results. Eat a consistent amount of foods that are high in vitamin K, such as:  Spinach, kale, broccoli, cabbage, collard greens, turnip greens, Brussels sprouts, peas, cauliflower, seaweed, and parsley.  Beef liver and pork liver.  Green tea.  Soybean oil.  Tell your health care provider about any and all medicines, vitamins, and supplements that you take, including aspirin and other over-the-counter anti-inflammatory medicines. Be especially cautious with aspirin and anti-inflammatory medicines. Do not take those before you ask your health care provider if it is safe to do so. This is important because many medicines can interfere with warfarin and affect the PT and INR results.  Do not start or stop taking any over-the-counter or prescription medicine unless your health care provider or pharmacist tells you to do so. If you take warfarin, you will also need to do these things:  Hold pressure over cuts for longer than usual.  Tell your dentist and other health care providers that you are taking warfarin before you have any procedures in which bleeding may occur.  Avoid alcohol or drink very small amounts. Tell your health care provider if you change your alcohol intake.  Do not use tobacco products, including cigarettes,  chewing tobacco, and e-cigarettes. If you need help quitting, ask your health care provider.  Avoid contact sports. General Instructions  Take over-the-counter and prescription medicines only as told by your health care provider. Anticoagulant medicines can have side effects, including easy bruising and difficulty stopping bleeding. If you are prescribed an anticoagulant, you will also need to do these things:  Hold pressure over cuts for longer than usual.  Tell your dentist and other health care providers that you are taking anticoagulants before you have any procedures in which bleeding may occur.  Avoid contact sports.  Wear a medical alert bracelet or carry a medical alert card that says you have had a PE.  Ask your health care provider how soon you can go back to your normal activities. Stay active to prevent new blood clots from forming.  Make sure to exercise while traveling or when you have been sitting or standing for  a long period of time. It is very important to exercise. Exercise your legs by walking or by tightening and relaxing your leg muscles often. Take frequent walks.  Wear compression stockings as told by your health care provider to help prevent more blood clots from forming.  Do not use tobacco products, including cigarettes, chewing tobacco, and e-cigarettes. If you need help quitting, ask your health care provider.  Keep all follow-up appointments with your health care provider. This is important. PREVENTION Take these actions to decrease your risk of developing another PE:  Exercise regularly. For at least 30 minutes every day, engage in:  Activity that involves moving your arms and legs.  Activity that encourages good blood flow through your body by increasing your heart rate.  Exercise your arms and legs every hour during long-distance travel (over 4 hours). Drink plenty of water and avoid drinking alcohol while traveling.  Avoid sitting or lying in bed  for long periods of time without moving your legs.  Maintain a weight that is appropriate for your height. Ask your health care provider what weight is healthy for you.  If you are a woman who is over 23 years of age, avoid unnecessary use of medicines that contain estrogen. These include birth control pills.  Do not smoke, especially if you take estrogen medicines. If you need help quitting, ask your health care provider.  If you are at very high risk for PE, wear compression stockings.  If you recently had a PE, have regularly scheduled ultrasound testing on your legs to check for new blood clots. If you are hospitalized, prevention measures may include:  Early walking after surgery, as soon as your health care provider says that it is safe.  Receiving anticoagulants to prevent blood clots. If you cannot take anticoagulants, other options may be available, such as wearing compression stockings or using different types of devices. SEEK IMMEDIATE MEDICAL CARE IF:  You have new or increased pain, swelling, or redness in an arm or leg.  You have numbness or tingling in an arm or leg.  You have shortness of breath while active or at rest.  You have chest pain.  You have a rapid or irregular heartbeat.  You feel light-headed or dizzy.  You cough up blood.  You notice blood in your vomit, bowel movement, or urine.  You have a fever. These symptoms may represent a serious problem that is an emergency. Do not wait to see if the symptoms will go away. Get medical help right away. Call your local emergency services (911 in the [place] Do not drive yourself to the hospital.   This information is not intended to replace advice given to you by your health care provider. Make sure you discuss any questions you have with your health care provider.   Document Released: 07/27/2000 Document Revised: 04/20/2015 Document Reviewed: 11/24/2014 Elsevier Interactive Patient Education [employer].

## 2016-02-22 NOTE — Progress Notes (Signed)
Sitter discontinued at 7 am per MD.  Will continue to monitor. Sondra ComeSilva, LaToya M, RN

## 2016-02-22 NOTE — Progress Notes (Signed)
PROGRESS NOTE  Roberta Griffin  JXB:[id number] DOB: May 07, 1926  DOA: 02/17/2016 PCP: No primary care provider on file.   Brief Narrative:  82 year old female with a history of dementia, hypertension, CKD presents with 2-3 day history of increasing falls. Due to the patient's dementia, she has not provided any significant history. According to the patient's daughter, the patient developed increasing dysarthria and dysphasia on 02/17/2016. There was concern for right facial droop as well as right upper extremity dysesthesias. In emergency department, CT of the brain was negative. Neurology was consulted, and full stroke workup was undertaken.MRI brain was negative for acute findings. Neurology felt that her dysarthria and dysesthesias were likely due to progression of her cognitive impairment. Her admission chest x-ray suggested possible soft tissue density in the right mediastinal border. As a result CT of the chest was obtained. CT of the chest revealed bilateral pulmonary emboli. As a result, the patient was started on intravenous heparin. CT of the chest also revealed possible pancreatic mass in the pancreatic tail. As a result, CT of abdomen and pelvis was obtained which revealed multiple similar small low-attenuation pancreatic lesions scattered in the pancreatic head and tail without appreciable enhancement, consistent with nonaggressive cystic pancreatic lesions, 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Safety sitter discontinued 7/12 morning when the earliest he can return to SNF will be on 7/13.    Assessment & Plan:   Principal Problem:   Acute ischemic stroke (HCC) Active Problems:   COPD (chronic obstructive pulmonary disease) (HCC)   Dementia   Essential hypertension   Normocytic anemia   CKD (chronic kidney disease), stage III   Acute encephalopathy   Dysphagia   Abnormal CXR   HLD (hyperlipidemia)   Acute pulmonary embolism (HCC)   Pancreatic mass   Acute encephalopathy -Suspect this may  actually represent continued functional and cognitive decline due to the patient's underlying dementia -Serum B12--353 -TSH--1.211 -RBC folate--857 -RPR--neg -Ammonia--22 -Urinalysis negative for pyuria - EKG--sinus rhythm, RBBB - Mental status may now be back to her baseline. This can be verified with family if they come by to visit today.  Acute pulmonary emboli -IV heparin initially started. -02/19/2016 after EF 55-60%, grade 1 DD, normal RV function -D/C aspirin 81 mg daily as anticoagulation started -transitioned IV heparin to po apixaban -Eliquis 10 mg bid x 7 days, then 5 mg bid starting 02/29/16  Pancreatic tail lesions/masses -CT abdomen and pelvis--Multiple similar small low-attenuation pancreatic lesions scattered in the pancreatic head and tail without appreciable enhancement, consistent with nonaggressive cystic pancreatic lesions, 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  -Appreciate GI consult-->repeat CT abd/pelvis in one year  Dysarthria/dysphagia/dysesthesias -02/18/2016 MRI brain negative for acute infarct -MRA brain negative -Carotid duplex negative for hemodynamically significant stenosis -EEG negative for seizure--mild diffuse slowing - Chest x-ray negative for infiltrates -LDL 142--started pravastatin per neurology -speech eval-->dys 3 diet thin liquid -PT-->SNF -echo--EF 55-60%, grade 1 DD  Dementia -Restarted Aricept and Risperdal -Restarted trazodone at bedtime  Hypertension -Restarted metoprolol succinate -d/c nifidipine; BP labile but acceptable  Renal insufficiency, unclear chronicity/Dehydration -start IVF with potassium -renal function improved 1.2-->0.94  Abnormal CXR -Soft tissue density of the upper right mediastinal border -CT chest as above  Hypokalemia/hypomagnesemia -repleted  Hyperlipidemia -start pravastatin per neurology  Hip pain -xrays of pelvis--neg for fracture or dislocation -tramadol and acetaminophen prn  pain  Pericardial effusion - Small to moderate pericardial effusion reported on CT abdomen and 2-D echo. On echo, free-flowing pericardial effusion circumferential to the heart without evidence of hemodynamic compromise. No clinical  tamponade physiology either. Consider outpatient repeat echo in a couple of months.  Left thyroid gland nodule - Seen on CT chest. Outpatient follow-up and evaluation as deemed necessary. May not be candidate for aggressive intervention. - TSH 1.211.   DVT prophylaxis: Anticoagulated on Eliquis Code Status: DO NOT RESUSCITATE Family Communication: None at bedside Disposition Plan: Likely DC to SNF 7/13. Safety sitter discontinued 7/12 morning.   Consultants:   Neurology  [facility]  Procedures:   2-D echo 02/19/16: Study Conclusions  - Left ventricle: The cavity size was normal. Systolic function was  normal. The estimated ejection fraction was in the range of 55%  to 60%. Wall motion was normal; there were no regional wall  motion abnormalities. Doppler parameters are consistent with  abnormal left ventricular relaxation (grade 1 diastolic  dysfunction). - Tricuspid valve: There was moderate regurgitation directed  centrally. - Pulmonary arteries: PA peak pressure: 33 mm Hg (S). - Pericardium, extracardiac: A small to moderate, free-flowing  pericardial effusion was identified circumferential to the heart.  There was no evidence of hemodynamic compromise.   Carotid Doppler 02/18/16: Summary: Bilateral: intimal wall thickening CCA. Mild soft plaque origin ICA. 1-39% ICA plaquing. Vertebral artery flow is antegrade.  Antimicrobials:   None    Subjective: Pleasantly confused. She thinks that she has lost her new shoes. No other complaints reported. Safety sitter at bedside this morning.  Objective:  Filed Vitals:   02/21/16 1745 02/21/16 2121 02/22/16 0630 02/22/16 0959  BP: 168/79 155/72 153/71 159/70  Pulse: 72 79 64 67   Temp: 98.4 F (36.9 C) 98.1 F (36.7 C) 98.6 F (37 C) 98.9 F (37.2 C)  TempSrc: Oral Oral Oral Oral  Resp: 20 16 18 19   Height:      Weight:      SpO2: 99% 98% 100% 98%    Intake/Output Summary (Last 24 hours) at 02/22/16 1235 Last data filed at 02/21/16 1808  Gross per 24 hour  Intake    120 ml  Output      0 ml  Net    120 ml   Filed Weights   02/18/16 0451  Weight: 51.393 kg (113 lb 4.8 oz)    Examination:  General exam: Pleasant elderly female sitting up comfortably in bed this morning. Respiratory system: Clear to auscultation. Respiratory effort normal. Cardiovascular system: S1 & S2 heard, RRR. No JVD, murmurs, rubs, gallops or clicks. No pedal edema. Telemetry: SB in the 50s with BBB morphology. Gastrointestinal system: Abdomen is nondistended, soft and nontender. No organomegaly or masses felt. Normal bowel sounds heard. Central nervous system: Alert and oriented to self and place only. Follows simple instructions. No focal neurological deficits. Extremities: Symmetric 5 x 5 power. Skin: No rashes, lesions or ulcers Psychiatry: Judgement and insight appear impaired. Mood & affect appropriate.     Data Reviewed: I have personally reviewed following labs and imaging studies  CBC:  Recent Labs Lab 02/17/16 1746 02/17/16 1818 02/18/16 1447 02/20/16 0515 02/21/16 0632  WBC 7.0  --   --  9.2 8.9  NEUTROABS 4.8  --   --   --   --   HGB 11.9* 12.2  --  13.2 14.2  HCT 36.1 36.0 38.3 39.9 42.8  MCV 88.5  --   --  87.9 87.5  PLT 157  --   --  172 215   Basic Metabolic Panel:  Recent Labs Lab 02/17/16 1746 02/17/16 1818 02/19/16 0316 02/20/16 0515 02/22/16 0648  NA 138  141 140 142 140  K 3.5 3.5 3.0* 4.0 4.1  CL 109 106 106 110 110  CO2 22  --  GLUCOSE 93 92 112* 100* 85  BUN CREATININE 1.19* 1.20* 1.06* 0.94 1.02*  CALCIUM 9.4  --  9.5 9.3 9.1  MG  --   --   --  1.6* 2.1   GFR: Estimated Creatinine Clearance: 30.9  mL/min (by C-G formula based on Cr of 1.02). Liver Function Tests:  Recent Labs Lab 02/17/16 1746  AST 19  ALT 7*  ALKPHOS 46  BILITOT 0.3  PROT 6.4*  ALBUMIN 3.5   No results for input(s): LIPASE, AMYLASE in the last 168 hours.  Recent Labs Lab 02/18/16 1447  AMMONIA 22   Coagulation Profile:  Recent Labs Lab 02/17/16 1746  INR 1.14   Cardiac Enzymes: No results for input(s): CKTOTAL, CKMB, CKMBINDEX, TROPONINI in the last 168 hours. BNP (last 3 results) No results for input(s): PROBNP in the last 8760 hours. HbA1C: No results for input(s): HGBA1C in the last 72 hours. CBG:  Recent Labs Lab 02/21/16 1120 02/21/16 1800 02/21/16 2340 02/22/16 0623 02/22/16 1117  GLUCAP 90 103* 92 93 89   Lipid Profile: No results for input(s): CHOL, HDL, LDLCALC, TRIG, CHOLHDL, LDLDIRECT in the last 72 hours. Thyroid Function Tests: No results for input(s): TSH, T4TOTAL, FREET4, T3FREE, THYROIDAB in the last 72 hours. Anemia Panel: No results for input(s): VITAMINB12, FOLATE, FERRITIN, TIBC, IRON, RETICCTPCT in the last 72 hours.  Sepsis Labs: No results for input(s): PROCALCITON, LATICACIDVEN in the last 168 hours.  Recent Results (from the past 240 hour(s))  Urine culture     Status: Abnormal   Collection Time: 02/17/16  9:15 PM  Result Value Ref Range Status   Specimen Description URINE, RANDOM  Final   Special Requests NONE  Final   Culture MULTIPLE SPECIES PRESENT, SUGGEST RECOLLECTION (A)  Final   Report Status 02/19/2016 FINAL  Final         Radiology Studies: Ct Abdomen Pelvis W Contrast  02/21/2016  CLINICAL DATA:  82 year old female inpatient with possible pancreatic on chest CT. EXAM: CT ABDOMEN AND PELVIS WITH CONTRAST TECHNIQUE: Multidetector CT imaging of the abdomen and pelvis was performed using the standard protocol following bolus administration of intravenous contrast. CONTRAST:  100 cc Isovue 370 IV. COMPARISON:  02/19/2016 chest CT. FINDINGS:  Motion degraded scan. Lower chest: No significant pulmonary nodules or acute consolidative airspace disease. Small to moderate pericardial effusion/thickening measuring up to the 13 mm thickness anteriorly. Hepatobiliary: Normal liver size. No liver mass. Small subcapsular focus of arterial phase hyperenhancement in the far inferior right liver is occult on other sequences, consistent with transient hepatic attenuation difference. Probable 9 mm layering gallstone in the gallbladder surrounded by vicarious excretion of IV contrast within the gallbladder lumen, with no gallbladder wall thickening or pericholecystic fluid. No biliary ductal dilatation. Pancreas: There are multiple (at least 5) similar-appearing low-attenuation lesions scattered throughout the pancreatic head and tail, largest 1.6 x 0.6 x 0.9 cm in the uncinate process (series 11/ image 71) and 1.1 x 0.7 x 1.2 cm in the pancreatic tail (series 20/image 68), which demonstrate a lobulated contour and no appreciable enhancement. There is mild diffuse main pancreatic duct dilation (3 mm diameter). No peripancreatic fat stranding or fluid collections. Spleen: Normal size. No mass. Adrenals/Urinary Tract: No discrete adrenal nodules. No hydronephrosis. Simple 1.1 cm renal cyst in the lower  left kidney. No additional renal lesions. Normal bladder. Stomach/Bowel: Grossly normal stomach. Normal caliber small bowel with no small bowel wall thickening. Appendix not discretely visualized. Moderate sigmoid diverticulosis, with no large bowel wall thickening or pericolonic fat stranding. Vascular/Lymphatic: Atherosclerotic nonaneurysmal abdominal aorta. Patent portal, splenic, hepatic and renal veins. No pathologically enlarged lymph nodes in the abdomen or pelvis. Reproductive: Enlarged myomatous uterus with a dominant calcified 7.2 cm uterine fibroid. No adnexal mass . Other: No pneumoperitoneum. Trace free fluid in the right deep pelvis. Musculoskeletal: No  aggressive appearing focal osseous lesions. Marked lumbar spondylosis and levocurvature of the lumbar spine. IMPRESSION: 1. 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 Mild main pancreatic duct dilation. A follow-up pancreas protocol MRI (preferred if the patient can breath-hold) or CT abdomen without and with IV contrast is recommended in 12 months. This recommendation follows ACR consensus guidelines: Managing Incidental Findings on Abdominal CT: White Paper of the ACR Incidental Findings Committee. J Am Coll Radiol 2010;7:754-773. 2. Small moderate pericardial effusion/thickening. 3. Cholelithiasis.  No biliary ductal dilatation. 4. Moderate sigmoid diverticulosis. 5. Aortic atherosclerosis. 6. Myomatous uterus. Electronically Signed   By: Delbert PhenixJason A Poff M.D.   On: 02/21/2016 08:29   Dg Hips Bilat With Pelvis 3-4 Views  02/21/2016  CLINICAL DATA:  Bilateral hip pain. EXAM: DG HIP (WITH OR WITHOUT PELVIS) 3-4V BILAT COMPARISON:  CT of the abdomen pelvis 02/20/2016 FINDINGS: There is no evidence of hip fracture or dislocation. Mild to moderate osteoarthritic changes of bilateral hip joints are seen. Partially visualized severe degenerative changes of the lower lumbosacral spine noted. Urinary bladder is opacified by contrast. Partially calcified left pelvic mass corresponds to probable large uterine fibroid. IMPRESSION: No evidence of bilateral hip fracture. Moderate osteoarthritic changes of bilateral hips. Electronically Signed   By: Ted Mcalpineobrinka  Dimitrova M.D.   On: 02/21/2016 17:36        Scheduled Meds: . apixaban  10 mg Oral BID   Followed by  . [START ON 02/29/2016] apixaban  5 mg Oral BID  . aspirin  300 mg Rectal Daily   Or  . aspirin  325 mg Oral Daily  . donepezil  10 mg Oral QHS  . metoprolol succinate  50 mg Oral Daily   . pravastatin  20 mg Oral q1800  . risperiDONE  1 mg Oral BID  . traZODone  50 mg Oral QHS   Continuous Infusions: . sodium chloride 0.9 % 1,000 mL with potassium chloride 20 mEq infusion 75 mL/hr at 02/22/16 0359     LOS: 5 days    Time spent: 30 minutes.    Chestnut Hill HospitalNGALGI,ANAND, MD [facility] Pager [phone]  If 7PM-7AM, please contact night-coverage [email or web address] Password [email or web address] 02/22/2016, 12:35 PM

## 2016-02-23 DIAGNOSIS — J449 Chronic obstructive pulmonary disease, unspecified: Secondary | ICD-10-CM | POA: Diagnosis not present

## 2016-02-23 DIAGNOSIS — G9340 Encephalopathy, unspecified: Secondary | ICD-10-CM | POA: Diagnosis not present

## 2016-02-23 DIAGNOSIS — J438 Other emphysema: Secondary | ICD-10-CM | POA: Diagnosis not present

## 2016-02-23 DIAGNOSIS — E041 Nontoxic single thyroid nodule: Secondary | ICD-10-CM | POA: Diagnosis not present

## 2016-02-23 DIAGNOSIS — D649 Anemia, unspecified: Secondary | ICD-10-CM | POA: Diagnosis not present

## 2016-02-23 DIAGNOSIS — H26492 Other secondary cataract, left eye: Secondary | ICD-10-CM | POA: Diagnosis not present

## 2016-02-23 DIAGNOSIS — F419 Anxiety disorder, unspecified: Secondary | ICD-10-CM | POA: Diagnosis not present

## 2016-02-23 DIAGNOSIS — Z961 Presence of intraocular lens: Secondary | ICD-10-CM | POA: Diagnosis not present

## 2016-02-23 DIAGNOSIS — I10 Essential (primary) hypertension: Secondary | ICD-10-CM | POA: Diagnosis not present

## 2016-02-23 DIAGNOSIS — F0280 Dementia in other diseases classified elsewhere without behavioral disturbance: Secondary | ICD-10-CM | POA: Diagnosis not present

## 2016-02-23 DIAGNOSIS — N183 Chronic kidney disease, stage 3 (moderate): Secondary | ICD-10-CM | POA: Diagnosis not present

## 2016-02-23 DIAGNOSIS — G9349 Other encephalopathy: Secondary | ICD-10-CM | POA: Diagnosis not present

## 2016-02-23 DIAGNOSIS — I2699 Other pulmonary embolism without acute cor pulmonale: Secondary | ICD-10-CM | POA: Diagnosis not present

## 2016-02-23 DIAGNOSIS — R1310 Dysphagia, unspecified: Secondary | ICD-10-CM | POA: Diagnosis not present

## 2016-02-23 DIAGNOSIS — E785 Hyperlipidemia, unspecified: Secondary | ICD-10-CM | POA: Diagnosis not present

## 2016-02-23 DIAGNOSIS — K8689 Other specified diseases of pancreas: Secondary | ICD-10-CM | POA: Diagnosis not present

## 2016-02-23 DIAGNOSIS — I6789 Other cerebrovascular disease: Secondary | ICD-10-CM | POA: Diagnosis not present

## 2016-02-23 DIAGNOSIS — G4700 Insomnia, unspecified: Secondary | ICD-10-CM | POA: Diagnosis not present

## 2016-02-23 DIAGNOSIS — I2690 Septic pulmonary embolism without acute cor pulmonale: Secondary | ICD-10-CM | POA: Diagnosis not present

## 2016-02-23 DIAGNOSIS — K869 Disease of pancreas, unspecified: Secondary | ICD-10-CM | POA: Diagnosis not present

## 2016-02-23 DIAGNOSIS — I5032 Chronic diastolic (congestive) heart failure: Secondary | ICD-10-CM | POA: Diagnosis not present

## 2016-02-23 DIAGNOSIS — Z8673 Personal history of transient ischemic attack (TIA), and cerebral infarction without residual deficits: Secondary | ICD-10-CM | POA: Diagnosis not present

## 2016-02-23 DIAGNOSIS — F0390 Unspecified dementia without behavioral disturbance: Secondary | ICD-10-CM | POA: Diagnosis not present

## 2016-02-23 DIAGNOSIS — R938 Abnormal findings on diagnostic imaging of other specified body structures: Secondary | ICD-10-CM | POA: Diagnosis not present

## 2016-02-23 DIAGNOSIS — F29 Unspecified psychosis not due to a substance or known physiological condition: Secondary | ICD-10-CM | POA: Diagnosis not present

## 2016-02-23 DIAGNOSIS — R471 Dysarthria and anarthria: Secondary | ICD-10-CM | POA: Diagnosis not present

## 2016-02-23 MED ORDER — ACETAMINOPHEN 325 MG PO TABS: 650.0000 mg | ORAL_TABLET | Freq: Four times a day (QID) | ORAL | Status: DC | PRN

## 2016-02-23 NOTE — Care Management Important Message (Signed)
Important Message  Patient Details  Name: Roberta Griffin MRN: [id number] Date of Birth: 05/27/1926   Medicare Important Message Given:  Yes    Shoffner, Stephan MinisterSusan Coleman 02/23/2016, 8:21 AM

## 2016-02-23 NOTE — Progress Notes (Signed)
Report called to [phone].

## 2016-02-23 NOTE — Discharge Summary (Signed)
Physician Discharge Summary  Roberta Griffin ZOX:[id number] DOB: [birth year] DOA: 02/17/2016  PCP: No primary care provider on file.  Admit date: 02/17/2016 Discharge date: 02/23/2016  Admitted From:  Home Disposition:  [facility]  Recommendations for Outpatient Follow-up:  1. M.D. at SNF in 3-5 days. 2. Follow up with PCP, upon discharge from SNF 3. Please obtain BMP/CBC in one week 4. Please see other recommendations below for follow-up and evaluation.    Discharge Condition: Improved and stable CODE STATUS: DO NOT RESUSCITATE Diet recommendation: As per speech therapy recommendations as below,  Diet recommendations: Dysphagia 3 (mechanical soft);Thin liquid Liquids provided via: Cup;Straw Medication Administration: Whole meds with liquid Supervision: Patient able to self feed Compensations: Slow rate;Small sips/bites Postural Changes and/or Swallow Maneuvers: Seated upright 90 degrees;Upright 30-60 min after meal  Brief/Interim Summary: 82 year old female with a history of dementia, hypertension, CKD presented with 2-3 day history of increasing falls. Due to the patient's dementia, she has not provided any significant history. According to the patient's daughter, the patient developed increasing dysarthria and dysphasia on 02/17/2016. There was concern for right facial droop as well as right upper extremity dysesthesias. In the emergency department, CT of the brain was negative. Neurology was consulted, and full stroke workup was undertaken. MRI brain was negative for acute findings. Neurology felt that her dysarthria and dysesthesias were likely due to progression of her cognitive impairment. Her admission chest x-ray suggested possible soft tissue density in the right mediastinal border. As a result CT of the chest was obtained. CT of the chest revealed bilateral pulmonary emboli. As a result, the patient was started on intravenous heparin and then  transitioned to oral anticoagulation. CT of the chest also revealed possible pancreatic mass in the pancreatic tail. 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    Discharge Diagnoses:  Acute encephalopathy -Suspect this may actually represent continued functional and cognitive decline due to the patient's underlying dementia -Serum B12--353 -TSH--1.211 -RBC folate--857 -RPR--neg -Ammonia--22 -Urinalysis negative for pyuria - EKG--sinus rhythm, RBBB - Mental status may now be back to her baseline.  Acute pulmonary emboli -IV heparin initially started. -02/19/2016 after EF 55-60%, grade 1 DD, normal RV function -D/C aspirin 81 mg daily as anticoagulation started -transitioned IV heparin to po apixaban -Eliquis 10 mg bid x 7 days starting 02/21/16, then 5 mg bid starting 02/29/16  Pancreatic tail lesions/masses -CT abdomen and pelvis--Multiple similar small low-attenuation pancreatic lesions scattered in the pancreatic head and tail without appreciable enhancement, consistent with nonaggressive cystic pancreatic lesions, 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  - As per GI consultation: Cystic lesions of the pancreas: There are multiple lesions in there consistent with IPMNs. These have a fairly low potential for malignancy. There are no solid masses of the pancreas. It is recommended that these be followed up with repeat scanning in about 1 year. If that one of them appears to be enlarging biopsy could be considered at that time if clinically indicated based on all of her other problems. Asymptomatic gallstones. Dr. Randa Evens did not feel that she needs any further diagnostic workup at this time and suggested repeat imaging of the pancreas in 1 year if clinically  indicated.  Dysarthria/dysphagia/dysesthesias -02/18/2016 MRI brain negative for acute infarct -MRA brain negative -Carotid duplex negative for hemodynamically significant stenosis -EEG negative for  seizure--mild diffuse slowing - Chest x-ray negative for infiltrates -LDL 142--started pravastatin per neurology -speech eval-->dys 3 diet thin liquid -PT-->SNF -echo--EF 55-60%, grade 1 DD  Dementia -Restarted Aricept and Risperdal -Restarted trazodone at bedtime - No sitter for the last 24 hours. Received a small dose of IV Haldol last night. May consider adjusting medications at SNF as deemed necessary.  Hypertension -Restarted metoprolol succinate -d/c nifidipine; BP labile but acceptable  Renal insufficiency, unclear chronicity/Dehydration - Creatinine has normalized.  Abnormal CXR -Soft tissue density of the upper right mediastinal border -CT chest as below  Hypokalemia/hypomagnesemia -repleted  Hyperlipidemia -started pravastatin per neurology  Hip pain -xrays of pelvis--neg for fracture or dislocation - Acetaminophen for pain. Avoid opioids or tramadol. - No further pain reported.  Pericardial effusion - Small to moderate pericardial effusion reported on CT abdomen and 2-D echo. On echo, free-flowing pericardial effusion circumferential to the heart without evidence of hemodynamic compromise. No clinical tamponade physiology either. Consider outpatient repeat echo in a couple of months versus earlier if patient becomes symptomatic.  Left thyroid gland nodule - Seen on CT chest. Outpatient follow-up and evaluation as deemed necessary. May not be candidate for aggressive intervention. - TSH 1.211.   Discharge Instructions      Discharge Instructions    Call MD for:  difficulty breathing, headache or visual disturbances    Complete by:  As directed      Call MD for:  extreme fatigue    Complete by:  As directed      Call MD for:  persistant dizziness or  light-headedness    Complete by:  As directed      Call MD for:  severe uncontrolled pain    Complete by:  As directed      Call MD for:    Complete by:  As directed   Worsening confusion or altered mental state.     Discharge instructions    Complete by:  As directed   DIET: Diet recommendations: Dysphagia 3 (mechanical soft);Thin liquid Liquids provided via: Cup;Straw Medication Administration: Whole meds with liquid Supervision: Patient able to self feed Compensations: Slow rate;Small sips/bites Postural Changes and/or Swallow Maneuvers: Seated upright 90 degrees;Upright 30-60 min after meal     Increase activity slowly    Complete by:  As directed             Medication List    STOP taking these medications        meloxicam 7.5 MG tablet  Commonly known as:  MOBIC     NIFEdipine 90 MG 24 hr tablet  Commonly known as:  PROCARDIA XL/ADALAT-CC      TAKE these medications        3-IN-1 BEDSIDE TOILET Misc  1 Units by Does not apply route once.     acetaminophen 325 MG tablet  Commonly known as:  TYLENOL  Take 2 tablets (650 mg total) by mouth every 6 (six) hours as needed for mild pain, moderate pain, fever or headache.     apixaban 5 MG Tabs tablet  Commonly known as:  ELIQUIS  Take 2 tablets (10 mg total) by mouth 2 (two) times daily.     apixaban 5 MG Tabs tablet  Commonly known as:  ELIQUIS  Take 1 tablet (5 mg total) by mouth 2 (two) times daily.  Start taking on:  02/29/2016     donepezil 10 MG tablet  Commonly known as:  ARICEPT  Take 1 tablet (10 mg total) by  mouth at bedtime.     lidocaine 5 %  Commonly known as:  LIDODERM  Place 1 patch onto the skin daily. Remove & Discard patch within 12 hours or as directed by MD     metoprolol succinate 50 MG 24 hr tablet  Commonly known as:  TOPROL-XL  Take 1 tablet (50 mg total) by mouth daily. Take with or immediately following a meal.     pravastatin 20 MG tablet  Commonly known as:  PRAVACHOL  Take 1  tablet (20 mg total) by mouth daily at 6 PM.     risperiDONE 1 MG tablet  Commonly known as:  RISPERDAL  Take 1 mg by mouth 2 (two) times daily.     traZODone 50 MG tablet  Commonly known as:  DESYREL  Take 1 tablet (50 mg total) by mouth at bedtime.       Follow-up Information    Follow up with [facility] .   Specialty:  Skilled Nursing Facility   Contact information:   [address] [place] [address] [phone]      Follow up with M.D. at SNF. Schedule an appointment as soon as possible for a visit in 3 days.     No Known Allergies  Consultations:  [facility]  Neurology   Procedures/Studies: Ct Head Wo Contrast  02/17/2016  CLINICAL DATA:  Per pt daughter, c/o slurred speech for a couple of days. Pt family also states the L side of her face has been drooping. Pt has alzheimers and dementia, baseline is confused. Pt answered "32" for how old she was. Pt smiling. EXAM: CT HEAD WITHOUT CONTRAST TECHNIQUE: Contiguous axial images were obtained from the base of the skull through the vertex without intravenous contrast. COMPARISON:  None. FINDINGS: No acute intracranial hemorrhage. No focal mass lesion. No CT evidence of acute infarction. No midline shift or mass effect. No hydrocephalus. Basilar cisterns are patent. There are periventricular and subcortical white matter hypodensities. Generalized cortical atrophy. Paranasal sinuses and mastoid air cells are clear. Orbits are clear. IMPRESSION: 1. No acute intracranial findings. 2. Marked atrophy and moderate white matter microvascular disease. Electronically Signed   By: Genevive Bi M.D.   On: 02/17/2016 19:13   Ct Chest W Contrast  02/20/2016  CLINICAL DATA:  Abnormal chest x-ray suggesting upper right mediastinal soft tissue density and volume loss. EXAM: CT CHEST WITH CONTRAST TECHNIQUE: Multidetector CT imaging of the chest was performed during intravenous contrast  administration. CONTRAST:  80 mL Isovue 300 COMPARISON:  Chest 02/18/2016 FINDINGS: Filling defects are demonstrated in multiple bilateral lower lobe and right upper lobe segmental and subsegmental pulmonary arteries consistent with pulmonary embolus. Low clot burden is present. There is diffuse cardiac enlargement likely representing pre-existing heart failure. Small pericardial effusion. Coronary artery calcifications. Normal caliber thoracic aorta. No dissection. Heterogeneous left thyroid nodule measuring 2.3 cm diameter. Consider follow-up with elective ultrasound. No significant lymphadenopathy in the chest. Scattered fibrosis in the lungs. No focal airspace disease or consolidation. Atelectasis in the lung bases. No obstructing mass lesion is demonstrated. Chest x-ray findings probably relate to vascular structures. No pleural effusions. No pneumothorax. Included portions of the upper abdominal organs demonstrate transverse colon anteriorly. The pancreas is atrophic with mild pancreatic ductal dilatation. Suggestion of a low-attenuation lesion in the pancreatic tail possibly representing a mucinous tumor. Suggest follow-up with CT of the abdomen and pelvis in the elective setting for further evaluation. Degenerative changes in the spine.  No destructive bone lesions. Bilateral shoulder arthroplasties. IMPRESSION: Filling defects in multiple bilateral segmental and subsegmental pulmonary arteries consistent with pulmonary embolus. Clot burden slow. Diffuse cardiac enlargement likely represents pre-existing heart failure. Small pericardial effusion. Left thyroid gland nodule. Dilated pancreatic duct with possible low-attenuation lesion in the pancreatic tail. These results were called by telephone at the time of interpretation on 02/20/2016 at 02:17 Am to Dr. Georgia Dom, who verbally acknowledged these results. Electronically Signed   By: Burman Nieves M.D.   On: 02/20/2016 02:22   Mr Brain Wo  Contrast  02/18/2016  CLINICAL DATA:  82 year old female with increasing difficulty with speech and swallowing for 1 week. Facial droop. Increased falls. Initial encounter. EXAM: MRI HEAD WITHOUT CONTRAST MRA HEAD WITHOUT CONTRAST TECHNIQUE: Multiplanar, multiecho pulse sequences of the brain and surrounding structures were obtained without intravenous contrast. Angiographic images of the head were obtained using MRA technique without contrast. COMPARISON:  Noncontrast head CT 02/17/2016 FINDINGS: MRI HEAD FINDINGS Metal susceptibility artifact related to a tiny retained metal foreign body in the scalp (series 3, image 28 of the comparison) along the left posterior convexity mildly degrades diffusion weighted imaging. No convincing restricted diffusion or evidence of acute infarction. Major intracranial vascular flow voids are preserved. Mild intracranial artery dolichoectasia. No midline shift, mass effect, ventriculomegaly, extra-axial collection or acute intracranial hemorrhage. Cervicomedullary junction and pituitary are within normal limits. Negative for age visualized cervical spine. Normal bone marrow signal. There is prominent focal hyperostosis of the right frontal bone versus a small calcified meningioma (series 14, image 22). Minimal mass effect on the adjacent right frontal lobe with no cerebral edema. Small to moderate area of cortical encephalomalacia along the right peri-Rolandic cortex best seen on series 14, image 12. No chronic cerebral blood products identified. Underlying confluent cerebral white matter T2 and FLAIR hyperintensity, in addition to patchy bilateral similar white matter changes. Deep gray matter nuclei, brainstem, and cerebellum are normal for age. Visible internal auditory structures appear normal. Mastoids are clear. No skullbase abnormality identified. Paranasal sinuses are clear. Postoperative changes to the bilateral globes. Negative scalp soft tissues. MRA HEAD FINDINGS  Antegrade flow in the posterior circulation with codominant distal vertebral arteries. Normal PICA origins and no distal vertebral stenosis. Patent vertebrobasilar junction and basilar artery without stenosis. Mild ectasia at the basilar tip. SCA and PCA origins are normal. Left posterior communicating artery is present, the right is diminutive or absent. Tortuous left P1 segment. Bilateral PCA branches are normal. Antegrade flow in both ICA siphons. No siphon stenosis. Normal ophthalmic artery origins. Normal MCA and ACA origins. Tortuous A1 segments. Anterior communicating artery and visualized ACA branches are within normal limits. Mild MCA tortuosity. M1 segments and visualized bilateral MCA branches otherwise are normal. IMPRESSION: 1.  No acute intracranial abnormality. 2. Chronic posterior right MCA infarct with superimposed bilateral white matter changes most suggestive of chronic small vessel disease. 3.  Negative intracranial MRA aside from mild arterial tortuosity. 4. Focal hyperostosis of the right frontal bone versus small right frontal meningioma, appears inconsequential. Electronically Signed   By: Odessa Fleming M.D.   On: 02/18/2016 08:19   Ct Abdomen Pelvis W Contrast  02/21/2016  CLINICAL DATA:  82 year old female inpatient with possible pancreatic on chest CT. EXAM: CT ABDOMEN AND PELVIS WITH CONTRAST TECHNIQUE: Multidetector CT imaging of the abdomen and pelvis was performed using the standard protocol following bolus administration of intravenous contrast. CONTRAST:  100 cc Isovue 370 IV. COMPARISON:  02/19/2016 chest CT. FINDINGS: Motion degraded  scan. Lower chest: No significant pulmonary nodules or acute consolidative airspace disease. Small to moderate pericardial effusion/thickening measuring up to the 13 mm thickness anteriorly. Hepatobiliary: Normal liver size. No liver mass. Small subcapsular focus of arterial phase hyperenhancement in the far inferior right liver is occult on other  sequences, consistent with transient hepatic attenuation difference. Probable 9 mm layering gallstone in the gallbladder surrounded by vicarious excretion of IV contrast within the gallbladder lumen, with no gallbladder wall thickening or pericholecystic fluid. No biliary ductal dilatation. Pancreas: There are multiple (at least 5) similar-appearing low-attenuation lesions scattered throughout the pancreatic head and tail, largest 1.6 x 0.6 x 0.9 cm in the uncinate process (series 11/ image 71) and 1.1 x 0.7 x 1.2 cm in the pancreatic tail (series 20/image 68), which demonstrate a lobulated contour and no appreciable enhancement. There is mild diffuse main pancreatic duct dilation (3 mm diameter). No peripancreatic fat stranding or fluid collections. Spleen: Normal size. No mass. Adrenals/Urinary Tract: No discrete adrenal nodules. No hydronephrosis. Simple 1.1 cm renal cyst in the lower left kidney. No additional renal lesions. Normal bladder. Stomach/Bowel: Grossly normal stomach. Normal caliber small bowel with no small bowel wall thickening. Appendix not discretely visualized. Moderate sigmoid diverticulosis, with no large bowel wall thickening or pericolonic fat stranding. Vascular/Lymphatic: Atherosclerotic nonaneurysmal abdominal aorta. Patent portal, splenic, hepatic and renal veins. No pathologically enlarged lymph nodes in the abdomen or pelvis. Reproductive: Enlarged myomatous uterus with a dominant calcified 7.2 cm uterine fibroid. No adnexal mass . Other: No pneumoperitoneum. Trace free fluid in the right deep pelvis. Musculoskeletal: No aggressive appearing focal osseous lesions. Marked lumbar spondylosis and levocurvature of the lumbar spine. IMPRESSION: 1. 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 Mild main pancreatic duct  dilation. A follow-up pancreas protocol MRI (preferred if the patient can breath-hold) or CT abdomen without and with IV contrast is recommended in 12 months. This recommendation follows ACR consensus guidelines: Managing Incidental Findings on Abdominal CT: White Paper of the ACR Incidental Findings Committee. J Am Coll Radiol 2010;7:754-773. 2. Small moderate pericardial effusion/thickening. 3. Cholelithiasis.  No biliary ductal dilatation. 4. Moderate sigmoid diverticulosis. 5. Aortic atherosclerosis. 6. Myomatous uterus. Electronically Signed   By: Delbert Phenix M.D.   On: 02/21/2016 08:29   Dg Chest Port 1 View  02/18/2016  CLINICAL DATA:  82 year old female with a history of difficulty speaking and falling EXAM: PORTABLE CHEST 1 VIEW COMPARISON:  None. FINDINGS: Cardiomediastinal silhouette enlarged. Calcifications of the aortic arch. No evidence of central vascular congestion. Soft tissue density projects over the upper right mediastinal border, crossing the contours of the clavicular head and upper posterior ribs. Uplifting of the minor fissure No pneumothorax or pleural effusion.  No confluent airspace disease. Surgical changes of prior bilateral reverse shoulder arthroplasty. No displaced fracture. IMPRESSION: No radiographic evidence of acute cardiopulmonary disease. Soft tissue density of the upper right mediastinal border. There is associated uplifting of the minor fissure. Findings are concerning for right upper lobe partial volume loss, and a central obstructing tumor is not excluded. Further evaluation with contrast-enhanced CT is recommended. These results will be called to the ordering clinician or representative by the Radiologist Assistant, and communication documented in the PACS or zVision Dashboard. Cardiomegaly. Aortic atherosclerosis. Signed, Yvone Neu. Loreta Ave, DO [facility] Electronically Signed   By: Gilmer Mor D.O.   On:  02/18/2016 10:45   Mr Maxine Glenn Head/brain Wo Cm  02/18/2016  CLINICAL DATA:  82 year old female with increasing difficulty with speech and swallowing for 1 week. Facial droop. Increased falls. Initial encounter. EXAM: MRI HEAD WITHOUT CONTRAST MRA HEAD WITHOUT CONTRAST TECHNIQUE: Multiplanar, multiecho pulse sequences of the brain and surrounding structures were obtained without intravenous contrast. Angiographic images of the head were obtained using MRA technique without contrast. COMPARISON:  Noncontrast head CT 02/17/2016 FINDINGS: MRI HEAD FINDINGS Metal susceptibility artifact related to a tiny retained metal foreign body in the scalp (series 3, image 28 of the comparison) along the left posterior convexity mildly degrades diffusion weighted imaging. No convincing restricted diffusion or evidence of acute infarction. Major intracranial vascular flow voids are preserved. Mild intracranial artery dolichoectasia. No midline shift, mass effect, ventriculomegaly, extra-axial collection or acute intracranial hemorrhage. Cervicomedullary junction and pituitary are within normal limits. Negative for age visualized cervical spine. Normal bone marrow signal. There is prominent focal hyperostosis of the right frontal bone versus a small calcified meningioma (series 14, image 22). Minimal mass effect on the adjacent right frontal lobe with no cerebral edema. Small to moderate area of cortical encephalomalacia along the right peri-Rolandic cortex best seen on series 14, image 12. No chronic cerebral blood products identified. Underlying confluent cerebral white matter T2 and FLAIR hyperintensity, in addition to patchy bilateral similar white matter changes. Deep gray matter nuclei, brainstem, and cerebellum are normal for age. Visible internal auditory structures appear normal. Mastoids are clear. No skullbase abnormality identified. Paranasal sinuses are clear. Postoperative changes to the bilateral globes. Negative scalp  soft tissues. MRA HEAD FINDINGS Antegrade flow in the posterior circulation with codominant distal vertebral arteries. Normal PICA origins and no distal vertebral stenosis. Patent vertebrobasilar junction and basilar artery without stenosis. Mild ectasia at the basilar tip. SCA and PCA origins are normal. Left posterior communicating artery is present, the right is diminutive or absent. Tortuous left P1 segment. Bilateral PCA branches are normal. Antegrade flow in both ICA siphons. No siphon stenosis. Normal ophthalmic artery origins. Normal MCA and ACA origins. Tortuous A1 segments. Anterior communicating artery and visualized ACA branches are within normal limits. Mild MCA tortuosity. M1 segments and visualized bilateral MCA branches otherwise are normal. IMPRESSION: 1.  No acute intracranial abnormality. 2. Chronic posterior right MCA infarct with superimposed bilateral white matter changes most suggestive of chronic small vessel disease. 3.  Negative intracranial MRA aside from mild arterial tortuosity. 4. Focal hyperostosis of the right frontal bone versus small right frontal meningioma, appears inconsequential. Electronically Signed   By: Odessa Fleming M.D.   On: 02/18/2016 08:19   Dg Hips Bilat With Pelvis 3-4 Views  02/21/2016  CLINICAL DATA:  Bilateral hip pain. EXAM: DG HIP (WITH OR WITHOUT PELVIS) 3-4V BILAT COMPARISON:  CT of the abdomen pelvis 02/20/2016 FINDINGS: There is no evidence of hip fracture or dislocation. Mild to moderate osteoarthritic changes of bilateral hip joints are seen. Partially visualized severe degenerative changes of the lower lumbosacral spine noted. Urinary bladder is opacified by contrast. Partially calcified left pelvic mass corresponds to probable large uterine fibroid. IMPRESSION: No evidence of bilateral hip fracture. Moderate osteoarthritic changes of bilateral hips. Electronically Signed   By: Ted Mcalpine M.D.   On: 02/21/2016 17:36        Discharge  Exam:  Filed Vitals:   02/22/16 2219 02/23/16 0206 02/23/16 0516 02/23/16 0945  BP: 176/90 152/77 152/86 120/69  Pulse: 86 90 74 72  Temp: 98.1 F (36.7 C)  98.2 F (36.8 C) 98.4 F (36.9 C) 97.9 F (36.6 C)  TempSrc: Oral Oral Oral Oral  Resp: 20 18 18 20   Height:      Weight:      SpO2: 99% 99% 98% 98%    General: Pt is alert, awake, not in acute distress Cardiovascular: RRR, S1/S2 +, no rubs, no gallops Respiratory: CTA bilaterally, no wheezing, no rhonchi Abdominal: Soft, NT, ND, bowel sounds + Extremities: no edema, no cyanosis   The results of significant diagnostics from this hospitalization (including imaging, microbiology, ancillary and laboratory) are listed below for reference.    Significant Diagnostic Studies: Ct Head Wo Contrast  02/17/2016  CLINICAL DATA:  Per pt daughter, c/o slurred speech for a couple of days. Pt family also states the L side of her face has been drooping. Pt has alzheimers and dementia, baseline is confused. Pt answered "52" for how old she was. Pt smiling. EXAM: CT HEAD WITHOUT CONTRAST TECHNIQUE: Contiguous axial images were obtained from the base of the skull through the vertex without intravenous contrast. COMPARISON:  None. FINDINGS: No acute intracranial hemorrhage. No focal mass lesion. No CT evidence of acute infarction. No midline shift or mass effect. No hydrocephalus. Basilar cisterns are patent. There are periventricular and subcortical white matter hypodensities. Generalized cortical atrophy. Paranasal sinuses and mastoid air cells are clear. Orbits are clear. IMPRESSION: 1. No acute intracranial findings. 2. Marked atrophy and moderate white matter microvascular disease. Electronically Signed   By: Genevive Bi M.D.   On: 02/17/2016 19:13   Ct Chest W Contrast  02/20/2016  CLINICAL DATA:  Abnormal chest x-ray suggesting upper right mediastinal soft tissue density and volume loss. EXAM: CT CHEST WITH CONTRAST TECHNIQUE: Multidetector  CT imaging of the chest was performed during intravenous contrast administration. CONTRAST:  80 mL Isovue 300 COMPARISON:  Chest 02/18/2016 FINDINGS: Filling defects are demonstrated in multiple bilateral lower lobe and right upper lobe segmental and subsegmental pulmonary arteries consistent with pulmonary embolus. Low clot burden is present. There is diffuse cardiac enlargement likely representing pre-existing heart failure. Small pericardial effusion. Coronary artery calcifications. Normal caliber thoracic aorta. No dissection. Heterogeneous left thyroid nodule measuring 2.3 cm diameter. Consider follow-up with elective ultrasound. No significant lymphadenopathy in the chest. Scattered fibrosis in the lungs. No focal airspace disease or consolidation. Atelectasis in the lung bases. No obstructing mass lesion is demonstrated. Chest x-ray findings probably relate to vascular structures. No pleural effusions. No pneumothorax. Included portions of the upper abdominal organs demonstrate transverse colon anteriorly. The pancreas is atrophic with mild pancreatic ductal dilatation. Suggestion of a low-attenuation lesion in the pancreatic tail possibly representing a mucinous tumor. Suggest follow-up with CT of the abdomen and pelvis in the elective setting for further evaluation. Degenerative changes in the spine. No destructive bone lesions. Bilateral shoulder arthroplasties. IMPRESSION: Filling defects in multiple bilateral segmental and subsegmental pulmonary arteries consistent with pulmonary embolus. Clot burden slow. Diffuse cardiac enlargement likely represents pre-existing heart failure. Small pericardial effusion. Left thyroid gland nodule. Dilated pancreatic duct with possible low-attenuation lesion in the pancreatic tail. These results were called by telephone at the time of interpretation on 02/20/2016 at 02:17 Am to Dr. Georgia Dom, who verbally acknowledged these results. Electronically Signed   By: Burman Nieves M.D.   On: 02/20/2016 02:22   Mr Brain Wo Contrast  02/18/2016  CLINICAL DATA:  82 year old female with increasing difficulty with speech and swallowing for 1 week. Facial droop. Increased falls. Initial encounter. EXAM: MRI HEAD  WITHOUT CONTRAST MRA HEAD WITHOUT CONTRAST TECHNIQUE: Multiplanar, multiecho pulse sequences of the brain and surrounding structures were obtained without intravenous contrast. Angiographic images of the head were obtained using MRA technique without contrast. COMPARISON:  Noncontrast head CT 02/17/2016 FINDINGS: MRI HEAD FINDINGS Metal susceptibility artifact related to a tiny retained metal foreign body in the scalp (series 3, image 28 of the comparison) along the left posterior convexity mildly degrades diffusion weighted imaging. No convincing restricted diffusion or evidence of acute infarction. Major intracranial vascular flow voids are preserved. Mild intracranial artery dolichoectasia. No midline shift, mass effect, ventriculomegaly, extra-axial collection or acute intracranial hemorrhage. Cervicomedullary junction and pituitary are within normal limits. Negative for age visualized cervical spine. Normal bone marrow signal. There is prominent focal hyperostosis of the right frontal bone versus a small calcified meningioma (series 14, image 22). Minimal mass effect on the adjacent right frontal lobe with no cerebral edema. Small to moderate area of cortical encephalomalacia along the right peri-Rolandic cortex best seen on series 14, image 12. No chronic cerebral blood products identified. Underlying confluent cerebral white matter T2 and FLAIR hyperintensity, in addition to patchy bilateral similar white matter changes. Deep gray matter nuclei, brainstem, and cerebellum are normal for age. Visible internal auditory structures appear normal. Mastoids are clear. No skullbase abnormality identified. Paranasal sinuses are clear. Postoperative changes to the bilateral globes.  Negative scalp soft tissues. MRA HEAD FINDINGS Antegrade flow in the posterior circulation with codominant distal vertebral arteries. Normal PICA origins and no distal vertebral stenosis. Patent vertebrobasilar junction and basilar artery without stenosis. Mild ectasia at the basilar tip. SCA and PCA origins are normal. Left posterior communicating artery is present, the right is diminutive or absent. Tortuous left P1 segment. Bilateral PCA branches are normal. Antegrade flow in both ICA siphons. No siphon stenosis. Normal ophthalmic artery origins. Normal MCA and ACA origins. Tortuous A1 segments. Anterior communicating artery and visualized ACA branches are within normal limits. Mild MCA tortuosity. M1 segments and visualized bilateral MCA branches otherwise are normal. IMPRESSION: 1.  No acute intracranial abnormality. 2. Chronic posterior right MCA infarct with superimposed bilateral white matter changes most suggestive of chronic small vessel disease. 3.  Negative intracranial MRA aside from mild arterial tortuosity. 4. Focal hyperostosis of the right frontal bone versus small right frontal meningioma, appears inconsequential. Electronically Signed   By: Odessa Fleming M.D.   On: 02/18/2016 08:19   Ct Abdomen Pelvis W Contrast  02/21/2016  CLINICAL DATA:  81 year old female inpatient with possible pancreatic on chest CT. EXAM: CT ABDOMEN AND PELVIS WITH CONTRAST TECHNIQUE: Multidetector CT imaging of the abdomen and pelvis was performed using the standard protocol following bolus administration of intravenous contrast. CONTRAST:  100 cc Isovue 370 IV. COMPARISON:  02/19/2016 chest CT. FINDINGS: Motion degraded scan. Lower chest: No significant pulmonary nodules or acute consolidative airspace disease. Small to moderate pericardial effusion/thickening measuring up to the 13 mm thickness anteriorly. Hepatobiliary: Normal liver size. No liver mass. Small subcapsular focus of arterial phase hyperenhancement in the far  inferior right liver is occult on other sequences, consistent with transient hepatic attenuation difference. Probable 9 mm layering gallstone in the gallbladder surrounded by vicarious excretion of IV contrast within the gallbladder lumen, with no gallbladder wall thickening or pericholecystic fluid. No biliary ductal dilatation. Pancreas: There are multiple (at least 5) similar-appearing low-attenuation lesions scattered throughout the pancreatic head and tail, largest 1.6 x 0.6 x 0.9 cm in the uncinate process (series 11/ image 71) and 1.1 x  0.7 x 1.2 cm in the pancreatic tail (series 20/image 68), which demonstrate a lobulated contour and no appreciable enhancement. There is mild diffuse main pancreatic duct dilation (3 mm diameter). No peripancreatic fat stranding or fluid collections. Spleen: Normal size. No mass. Adrenals/Urinary Tract: No discrete adrenal nodules. No hydronephrosis. Simple 1.1 cm renal cyst in the lower left kidney. No additional renal lesions. Normal bladder. Stomach/Bowel: Grossly normal stomach. Normal caliber small bowel with no small bowel wall thickening. Appendix not discretely visualized. Moderate sigmoid diverticulosis, with no large bowel wall thickening or pericolonic fat stranding. Vascular/Lymphatic: Atherosclerotic nonaneurysmal abdominal aorta. Patent portal, splenic, hepatic and renal veins. No pathologically enlarged lymph nodes in the abdomen or pelvis. Reproductive: Enlarged myomatous uterus with a dominant calcified 7.2 cm uterine fibroid. No adnexal mass . Other: No pneumoperitoneum. Trace free fluid in the right deep pelvis. Musculoskeletal: No aggressive appearing focal osseous lesions. Marked lumbar spondylosis and levocurvature of the lumbar spine. IMPRESSION: 1. 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 Mild main pancreatic duct dilation. A follow-up pancreas protocol MRI (preferred if the patient can breath-hold) or CT abdomen without and with IV contrast is recommended in 12 months. This recommendation follows ACR consensus guidelines: Managing Incidental Findings on Abdominal CT: White Paper of the ACR Incidental Findings Committee. J Am Coll Radiol 2010;7:754-773. 2. Small moderate pericardial effusion/thickening. 3. Cholelithiasis.  No biliary ductal dilatation. 4. Moderate sigmoid diverticulosis. 5. Aortic atherosclerosis. 6. Myomatous uterus. Electronically Signed   By: Delbert PhenixJason A Poff M.D.   On: 02/21/2016 08:29   Dg Chest Port 1 View  02/18/2016  CLINICAL DATA:  82 year old female with a history of difficulty speaking and falling EXAM: PORTABLE CHEST 1 VIEW COMPARISON:  None. FINDINGS: Cardiomediastinal silhouette enlarged. Calcifications of the aortic arch. No evidence of central vascular congestion. Soft tissue density projects over the upper right mediastinal border, crossing the contours of the clavicular head and upper posterior ribs. Uplifting of the minor fissure No pneumothorax or pleural effusion.  No confluent airspace disease. Surgical changes of prior bilateral reverse shoulder arthroplasty. No displaced fracture. IMPRESSION: No radiographic evidence of acute cardiopulmonary disease. Soft tissue density of the upper right mediastinal border. There is associated uplifting of the minor fissure. Findings are concerning for right upper lobe partial volume loss, and a central obstructing tumor is not excluded. Further evaluation with contrast-enhanced CT is recommended. These results will be called to the ordering clinician or representative by the Radiologist Assistant, and communication documented in the PACS or zVision Dashboard. Cardiomegaly. Aortic atherosclerosis. Signed, Yvone NeuJaime S. Loreta AveWagner, DO [facility] Electronically  Signed   By: Gilmer MorJaime  Wagner D.O.   On: 02/18/2016 10:45   Mr Maxine GlennMra Head/brain Wo Cm  02/18/2016  CLINICAL DATA:  82 year old female with increasing difficulty with speech and swallowing for 1 week. Facial droop. Increased falls. Initial encounter. EXAM: MRI HEAD WITHOUT CONTRAST MRA HEAD WITHOUT CONTRAST TECHNIQUE: Multiplanar, multiecho pulse sequences of the brain and surrounding structures were obtained without intravenous contrast. Angiographic images of the head were obtained using MRA technique without contrast. COMPARISON:  Noncontrast head CT 02/17/2016 FINDINGS: MRI HEAD FINDINGS Metal susceptibility artifact related to a tiny retained metal foreign body in the scalp (series 3, image 28 of the comparison) along the left posterior convexity mildly degrades diffusion weighted imaging. No convincing restricted diffusion or evidence of acute infarction. Major intracranial vascular  flow voids are preserved. Mild intracranial artery dolichoectasia. No midline shift, mass effect, ventriculomegaly, extra-axial collection or acute intracranial hemorrhage. Cervicomedullary junction and pituitary are within normal limits. Negative for age visualized cervical spine. Normal bone marrow signal. There is prominent focal hyperostosis of the right frontal bone versus a small calcified meningioma (series 14, image 22). Minimal mass effect on the adjacent right frontal lobe with no cerebral edema. Small to moderate area of cortical encephalomalacia along the right peri-Rolandic cortex best seen on series 14, image 12. No chronic cerebral blood products identified. Underlying confluent cerebral white matter T2 and FLAIR hyperintensity, in addition to patchy bilateral similar white matter changes. Deep gray matter nuclei, brainstem, and cerebellum are normal for age. Visible internal auditory structures appear normal. Mastoids are clear. No skullbase abnormality identified. Paranasal sinuses are clear. Postoperative changes to  the bilateral globes. Negative scalp soft tissues. MRA HEAD FINDINGS Antegrade flow in the posterior circulation with codominant distal vertebral arteries. Normal PICA origins and no distal vertebral stenosis. Patent vertebrobasilar junction and basilar artery without stenosis. Mild ectasia at the basilar tip. SCA and PCA origins are normal. Left posterior communicating artery is present, the right is diminutive or absent. Tortuous left P1 segment. Bilateral PCA branches are normal. Antegrade flow in both ICA siphons. No siphon stenosis. Normal ophthalmic artery origins. Normal MCA and ACA origins. Tortuous A1 segments. Anterior communicating artery and visualized ACA branches are within normal limits. Mild MCA tortuosity. M1 segments and visualized bilateral MCA branches otherwise are normal. IMPRESSION: 1.  No acute intracranial abnormality. 2. Chronic posterior right MCA infarct with superimposed bilateral white matter changes most suggestive of chronic small vessel disease. 3.  Negative intracranial MRA aside from mild arterial tortuosity. 4. Focal hyperostosis of the right frontal bone versus small right frontal meningioma, appears inconsequential. Electronically Signed   By: Odessa Fleming M.D.   On: 02/18/2016 08:19   Dg Hips Bilat With Pelvis 3-4 Views  02/21/2016  CLINICAL DATA:  Bilateral hip pain. EXAM: DG HIP (WITH OR WITHOUT PELVIS) 3-4V BILAT COMPARISON:  CT of the abdomen pelvis 02/20/2016 FINDINGS: There is no evidence of hip fracture or dislocation. Mild to moderate osteoarthritic changes of bilateral hip joints are seen. Partially visualized severe degenerative changes of the lower lumbosacral spine noted. Urinary bladder is opacified by contrast. Partially calcified left pelvic mass corresponds to probable large uterine fibroid. IMPRESSION: No evidence of bilateral hip fracture. Moderate osteoarthritic changes of bilateral hips. Electronically Signed   By: Ted Mcalpine M.D.   On: 02/21/2016  17:36     Microbiology: Recent Results (from the past 240 hour(s))  Urine culture     Status: Abnormal   Collection Time: 02/17/16  9:15 PM  Result Value Ref Range Status   Specimen Description URINE, RANDOM  Final   Special Requests NONE  Final   Culture MULTIPLE SPECIES PRESENT, SUGGEST RECOLLECTION (A)  Final   Report Status 02/19/2016 FINAL  Final     Labs: Basic Metabolic Panel:  Recent Labs Lab 02/17/16 1746 02/17/16 1818 02/19/16 0316 02/20/16 0515 02/22/16 0648  NA 138 141 140 142 140  K 3.5 3.5 3.0* 4.0 4.1  CL 109 106 106 110 110  CO2 22  --  25 24 22   GLUCOSE 93 92 112* 100* 85  BUN 14 16 10 8 11   CREATININE 1.19* 1.20* 1.06* 0.94 1.02*  CALCIUM 9.4  --  9.5 9.3 9.1  MG  --   --   --  1.6*  2.1   Liver Function Tests:  Recent Labs Lab 02/17/16 1746  AST 19  ALT 7*  ALKPHOS 46  BILITOT 0.3  PROT 6.4*  ALBUMIN 3.5   No results for input(s): LIPASE, AMYLASE in the last 168 hours.  Recent Labs Lab 02/18/16 1447  AMMONIA 22   CBC:  Recent Labs Lab 02/17/16 1746 02/17/16 1818 02/18/16 1447 02/20/16 0515 02/21/16 0632  WBC 7.0  --   --  9.2 8.9  NEUTROABS 4.8  --   --   --   --   HGB 11.9* 12.2  --  13.2 14.2  HCT 36.1 36.0 38.3 39.9 42.8  MCV 88.5  --   --  87.9 87.5  PLT 157  --   --  172 215   Cardiac Enzymes: No results for input(s): CKTOTAL, CKMB, CKMBINDEX, TROPONINI in the last 168 hours. BNP: Invalid input(s): POCBNP CBG:  Recent Labs Lab 02/21/16 1120 02/21/16 1800 02/21/16 2340 02/22/16 0623 02/22/16 1117  GLUCAP 90 103* 92 93 89    Time coordinating discharge:  Greater than 30 minutes  Signed:  HONGALGI,ANAND, DO [facility] Pager: [phone] 02/23/2016, 1:14 PM

## 2016-02-23 NOTE — Progress Notes (Signed)
Patient removed peripheral IV, full linen change, gown changed. Patient very restless and anxious. Patient continues to talk about sister and "those babies". RN continues to reassure and reorient patient. Bed alarm on, call light placed within patient reach. RN will continue to monitor.

## 2016-02-23 NOTE — Care Management Note (Signed)
Case Management Note  Patient Details  Name: Roberta Griffin MRN: [id number] Date of Birth: 10/22/1925  Subjective/Objective:                    Action/Plan: Plan is for patient to discharge to [facility] today. No further needs per CM.   Expected Discharge Date:                  Expected Discharge Plan:  Skilled Nursing Facility  In-House Referral:  Clinical Social Work  Discharge planning Services  CM Consult  Post Acute Care Choice:    Choice offered to:     DME Arranged:    DME Agency:     HH Arranged:    HH Agency:     Status of Service:  Completed, signed off  If discussed at [employer] Meetings, dates discussed:    Additional Comments:  Kermit BaloKelli F Willard, RN 02/23/2016, 11:17 AM

## 2016-02-23 NOTE — Progress Notes (Signed)
PROGRESS NOTE  Zeda Gangwer  IHK:[id number] DOB: [birth year]  DOA: 02/17/2016 PCP: No primary care provider on file.   Brief Narrative:  82 year old female with a history of dementia, hypertension, CKD presents with 2-3 day history of increasing falls. Due to the patient's dementia, she has not provided any significant history. According to the patient's daughter, the patient developed increasing dysarthria and dysphasia on 02/17/2016. There was concern for right facial droop as well as right upper extremity dysesthesias. In emergency department, CT of the brain was negative. Neurology was consulted, and full stroke workup was undertaken.MRI brain was negative for acute findings. Neurology felt that her dysarthria and dysesthesias were likely due to progression of her cognitive impairment. Her admission chest x-ray suggested possible soft tissue density in the right mediastinal border. As a result CT of the chest was obtained. CT of the chest revealed bilateral pulmonary emboli. As a result, the patient was started on intravenous heparin. CT of the chest also revealed possible pancreatic mass in the pancreatic tail. As a result, CT of abdomen and pelvis was obtained which revealed multiple similar small low-attenuation pancreatic lesions scattered in the pancreatic head and tail without appreciable enhancement, consistent with nonaggressive cystic pancreatic lesions, 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Safety sitter discontinued 7/12 morning. DC to SNF.    Assessment & Plan:   Principal Problem:   Acute ischemic stroke (HCC) Active Problems:   COPD (chronic obstructive pulmonary disease) (HCC)   Dementia   Essential hypertension   Normocytic anemia   CKD (chronic kidney disease), stage III   Acute encephalopathy   Dysphagia   Abnormal CXR   HLD (hyperlipidemia)   Acute pulmonary embolism (HCC)   Pancreatic mass   Acute encephalopathy -Suspect this may actually represent continued functional and  cognitive decline due to the patient's underlying dementia -Serum B12--353 -TSH--1.211 -RBC folate--857 -RPR--neg -Ammonia--22 -Urinalysis negative for pyuria - EKG--sinus rhythm, RBBB - Mental status may now be back to her baseline.   Acute pulmonary emboli -IV heparin initially started. -02/19/2016 after EF 55-60%, grade 1 DD, normal RV function -D/C aspirin 81 mg daily as anticoagulation started -transitioned IV heparin to po apixaban -Eliquis 10 mg bid x 7 days, then 5 mg bid starting 02/29/16  Pancreatic tail lesions/masses -CT abdomen and pelvis--Multiple similar small low-attenuation pancreatic lesions scattered in the pancreatic head and tail without appreciable enhancement, consistent with nonaggressive cystic pancreatic lesions, 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  -Appreciate GI consult-->repeat CT abd/pelvis in one year  Dysarthria/dysphagia/dysesthesias -02/18/2016 MRI brain negative for acute infarct -MRA brain negative -Carotid duplex negative for hemodynamically significant stenosis -EEG negative for seizure--mild diffuse slowing - Chest x-ray negative for infiltrates -LDL 142--started pravastatin per neurology -speech eval-->dys 3 diet thin liquid -PT-->SNF -echo--EF 55-60%, grade 1 DD  Dementia -Restarted Aricept and Risperdal -Restarted trazodone at bedtime  Hypertension -Restarted metoprolol succinate -d/c nifidipine; BP labile but acceptable  Renal insufficiency, unclear chronicity/Dehydration - Creatinine has normalized.  Abnormal CXR -Soft tissue density of the upper right mediastinal border -CT chest as above  Hypokalemia/hypomagnesemia -repleted  Hyperlipidemia -started pravastatin per neurology  Hip pain -xrays of pelvis--neg for fracture or dislocation -tramadol and acetaminophen prn pain  Pericardial effusion - Small to moderate pericardial effusion reported on CT abdomen and 2-D echo. On echo, free-flowing pericardial effusion  circumferential to the heart without evidence of hemodynamic compromise. No clinical tamponade physiology either. Consider outpatient repeat echo in a couple of months versus earlier if patient becomes symptomatic.  Left thyroid gland nodule - Seen  on CT chest. Outpatient follow-up and evaluation as deemed necessary. May not be candidate for aggressive intervention. - TSH 1.211.   DVT prophylaxis: Anticoagulated on Eliquis Code Status: DO NOT RESUSCITATE Family Communication: None at bedside Disposition Plan: DC to SNF today.   Consultants:   Neurology  [facility]  Procedures:   2-D echo 02/19/16: Study Conclusions  - Left ventricle: The cavity size was normal. Systolic function was  normal. The estimated ejection fraction was in the range of 55%  to 60%. Wall motion was normal; there were no regional wall  motion abnormalities. Doppler parameters are consistent with  abnormal left ventricular relaxation (grade 1 diastolic  dysfunction). - Tricuspid valve: There was moderate regurgitation directed  centrally. - Pulmonary arteries: PA peak pressure: 33 mm Hg (S). - Pericardium, extracardiac: A small to moderate, free-flowing  pericardial effusion was identified circumferential to the heart.  There was no evidence of hemodynamic compromise.   Carotid Doppler 02/18/16: Summary: Bilateral: intimal wall thickening CCA. Mild soft plaque origin ICA. 1-39% ICA plaquing. Vertebral artery flow is antegrade.  Antimicrobials:   None    Subjective: Pleasantly confused. Seen eating breakfast this morning. No agitation.  Objective:  Filed Vitals:   02/22/16 2219 02/23/16 0206 02/23/16 0516 02/23/16 0945  BP: 176/90 152/77 152/86 120/69  Pulse: 86 90 74 72  Temp: 98.1 F (36.7 C) 98.2 F (36.8 C) 98.4 F (36.9 C) 97.9 F (36.6 C)  TempSrc: Oral Oral Oral Oral  Resp: 20 18 18 20   Height:      Weight:      SpO2: 99% 99% 98% 98%   No intake or output data in the 24  hours ending 02/23/16 1245 Filed Weights   02/18/16 0451  Weight: 51.393 kg (113 lb 4.8 oz)    Examination:  General exam: Pleasant elderly female sitting up comfortably in bed this morning, seen eating breakfast by herself. Respiratory system: Clear to auscultation. Respiratory effort normal. Cardiovascular system: S1 & S2 heard, RRR. No JVD, murmurs, rubs, gallops or clicks. No pedal edema.  Gastrointestinal system: Abdomen is nondistended, soft and nontender. No organomegaly or masses felt. Normal bowel sounds heard. Central nervous system: Alert and oriented to self and partly to place only. Follows simple instructions. No focal neurological deficits. Extremities: Symmetric 5 x 5 power. Skin: No rashes, lesions or ulcers Psychiatry: Judgement and insight appear impaired. Mood & affect appropriate.     Data Reviewed: I have personally reviewed following labs and imaging studies  CBC:  Recent Labs Lab 02/17/16 1746 02/17/16 1818 02/18/16 1447 02/20/16 0515 02/21/16 0632  WBC 7.0  --   --  9.2 8.9  NEUTROABS 4.8  --   --   --   --   HGB 11.9* 12.2  --  13.2 14.2  HCT 36.1 36.0 38.3 39.9 42.8  MCV 88.5  --   --  87.9 87.5  PLT 157  --   --  172 215   Basic Metabolic Panel:  Recent Labs Lab 02/17/16 1746 02/17/16 1818 02/19/16 0316 02/20/16 0515 02/22/16 0648  NA 138 141 140 142 140  K 3.5 3.5 3.0* 4.0 4.1  CL 109 106 106 110 110  CO2 22  --  25 24 22   GLUCOSE 93 92 112* 100* 85  BUN 14 16 10 8 11   CREATININE 1.19* 1.20* 1.06* 0.94 1.02*  CALCIUM 9.4  --  9.5 9.3 9.1  MG  --   --   --  1.6* 2.1  GFR: Estimated Creatinine Clearance: 30.9 mL/min (by C-G formula based on Cr of 1.02). Liver Function Tests:  Recent Labs Lab 02/17/16 1746  AST 19  ALT 7*  ALKPHOS 46  BILITOT 0.3  PROT 6.4*  ALBUMIN 3.5   No results for input(s): LIPASE, AMYLASE in the last 168 hours.  Recent Labs Lab 02/18/16 1447  AMMONIA 22   Coagulation Profile:  Recent  Labs Lab 02/17/16 1746  INR 1.14   Cardiac Enzymes: No results for input(s): CKTOTAL, CKMB, CKMBINDEX, TROPONINI in the last 168 hours. BNP (last 3 results) No results for input(s): PROBNP in the last 8760 hours. HbA1C: No results for input(s): HGBA1C in the last 72 hours. CBG:  Recent Labs Lab 02/21/16 1120 02/21/16 1800 02/21/16 2340 02/22/16 0623 02/22/16 1117  GLUCAP 90 103* 92 93 89   Lipid Profile: No results for input(s): CHOL, HDL, LDLCALC, TRIG, CHOLHDL, LDLDIRECT in the last 72 hours. Thyroid Function Tests: No results for input(s): TSH, T4TOTAL, FREET4, T3FREE, THYROIDAB in the last 72 hours. Anemia Panel: No results for input(s): VITAMINB12, FOLATE, FERRITIN, TIBC, IRON, RETICCTPCT in the last 72 hours.  Sepsis Labs: No results for input(s): PROCALCITON, LATICACIDVEN in the last 168 hours.  Recent Results (from the past 240 hour(s))  Urine culture     Status: Abnormal   Collection Time: 02/17/16  9:15 PM  Result Value Ref Range Status   Specimen Description URINE, RANDOM  Final   Special Requests NONE  Final   Culture MULTIPLE SPECIES PRESENT, SUGGEST RECOLLECTION (A)  Final   Report Status 02/19/2016 FINAL  Final         Radiology Studies: Dg Hips Bilat With Pelvis 3-4 Views  02/21/2016  CLINICAL DATA:  Bilateral hip pain. EXAM: DG HIP (WITH OR WITHOUT PELVIS) 3-4V BILAT COMPARISON:  CT of the abdomen pelvis 02/20/2016 FINDINGS: There is no evidence of hip fracture or dislocation. Mild to moderate osteoarthritic changes of bilateral hip joints are seen. Partially visualized severe degenerative changes of the lower lumbosacral spine noted. Urinary bladder is opacified by contrast. Partially calcified left pelvic mass corresponds to probable large uterine fibroid. IMPRESSION: No evidence of bilateral hip fracture. Moderate osteoarthritic changes of bilateral hips. Electronically Signed   By: Ted Mcalpineobrinka  Dimitrova M.D.   On: 02/21/2016 17:36         Scheduled Meds: . apixaban  10 mg Oral BID   Followed by  . [START ON 02/29/2016] apixaban  5 mg Oral BID  . donepezil  10 mg Oral QHS  . metoprolol succinate  50 mg Oral Daily  . pravastatin  20 mg Oral q1800  . risperiDONE  1 mg Oral BID  . traZODone  50 mg Oral QHS   Continuous Infusions: . sodium chloride 0.9 % 1,000 mL with potassium chloride 20 mEq infusion 75 mL/hr at 02/22/16 1822     LOS: 6 days    Time spent: 20 minutes.    Och Regional Medical CenterNGALGI,ANAND, MD [facility] Pager [phone]  If 7PM-7AM, please contact night-coverage [email or web address] Password [email or web address] 02/23/2016, 12:45 PM

## 2016-02-23 NOTE — Clinical Social Work Placement (Signed)
   CLINICAL SOCIAL WORK PLACEMENT  NOTE  Date:  02/23/2016  Patient Details  Name: Roberta Griffin MRN: [id number] Date of Birth: 12/22/1925  Clinical Social Work is seeking post-discharge placement for this patient at the Skilled  Nursing Facility level of care (*CSW will initial, date and re-position this form in  chart as items are completed):  Yes   Patient/family provided with [place] Clinical Social Work Department's list of facilities offering this level of care within the geographic area requested by the patient (or if unable, by the patient's family).  Yes   Patient/family informed of their freedom to choose among providers that offer the needed level of care, that participate in Medicare, Medicaid or managed care program needed by the patient, have an available bed and are willing to accept the patient.  Yes   Patient/family informed of [facility]'s ownership interest in [facility] and [facility], as well as of the fact that they are under no obligation to receive care at these facilities.  PASRR submitted to EDS on 02/19/16     PASRR number received on 02/19/16     Existing PASRR number confirmed on       FL2 transmitted to all facilities in geographic area requested by pt/family on 02/19/16     FL2 transmitted to all facilities within larger geographic area on       Patient informed that his/her managed care company has contracts with or will negotiate with certain facilities, including the following:        Yes   Patient/family informed of bed offers received.  [facility]     Physician recommends and patient chooses bed at      [facility] 02[facility]/16.  Patient to be transferred to facility by ptar     Patient family notified on 02/23/16 of transfer.  Name of family member notified:  Roberta Griffin     PHYSICIAN Please sign FL2     Additional Comment:     _______________________________________________ Izora RibasHoloman, Jenna M, LCSW 02/23/2016, 1:51 PM

## 2016-02-23 NOTE — Progress Notes (Signed)
[facility] will [facility] Anticipated discharge date: 7/13 Family notified: pt dtr Transportation by [facility]- called at 1:50pm  CSW signing off.  Merlyn LotJenna Holoman, LCSWA Clinical Social Worker [phone]

## 2016-02-24 ENCOUNTER — Non-Acute Institutional Stay (SKILLED_NURSING_FACILITY): Payer: Medicare Other | Admitting: Adult Health

## 2016-02-24 ENCOUNTER — Encounter: Payer: Self-pay | Admitting: Adult Health

## 2016-02-24 DIAGNOSIS — D649 Anemia, unspecified: Secondary | ICD-10-CM

## 2016-02-24 DIAGNOSIS — I2699 Other pulmonary embolism without acute cor pulmonale: Secondary | ICD-10-CM | POA: Diagnosis not present

## 2016-02-24 DIAGNOSIS — E041 Nontoxic single thyroid nodule: Secondary | ICD-10-CM | POA: Diagnosis not present

## 2016-02-24 DIAGNOSIS — I5032 Chronic diastolic (congestive) heart failure: Secondary | ICD-10-CM

## 2016-02-24 DIAGNOSIS — J438 Other emphysema: Secondary | ICD-10-CM | POA: Diagnosis not present

## 2016-02-24 DIAGNOSIS — N183 Chronic kidney disease, stage 3 unspecified: Secondary | ICD-10-CM

## 2016-02-24 DIAGNOSIS — E785 Hyperlipidemia, unspecified: Secondary | ICD-10-CM | POA: Diagnosis not present

## 2016-02-24 NOTE — Progress Notes (Signed)
Patient ID: Roberta Griffin, female   DOB: May 20, 1926, 82 y.o.   MRN: [id number]   Location:   [facility] Room Number: 128-A Place of Service:  SNF (31)   CODE STATUS: DNR  No Known Allergies  Chief Complaint  Patient presents with  . Hospitalization Follow-up    Hospital Follow up    HPI:  She has been hospitalized due to falls at home with increased weakness; right facial droop with concern for cva. Her cva workup was negative. She was found to have bilateral pulmonary emboli. She is unable to fully participate in the hpi or ros; but she did tell me that she is feeling good. There are no nursing concerns at this time.    Past Medical History  Diagnosis Date  . Scoliosis   . Dementia   . COPD (chronic obstructive pulmonary disease) (HCC)   . Hypertension   . Dysphagia   . Depression     Past Surgical History  Procedure Laterality Date  . Shoulder surgery    . Knee surgery    . Abdominal hysterectomy    . Vitrectomy and cataract      Social History   Social History  . Marital Status: Single    Spouse Name: N/A  . Number of Children: N/A  . Years of Education: N/A   Occupational History  . Not on file.   Social History Main Topics  . Smoking status: Never Smoker   . Smokeless tobacco: Not on file  . Alcohol Use: No  . Drug Use: No  . Sexual Activity: Not on file   Other Topics Concern  . Not on file   Social History Narrative   Family History  Problem Relation Age of Onset  . Stroke Neg Hx       VITAL SIGNS BP 127/85 mmHg  Pulse 113  Temp(Src) 97.5 F (36.4 C) (Oral)  Resp 18  Ht  (1.6 m)  Wt 107 lb (48.535 kg)  BMI 18.96 kg/m2  SpO2 95%  Patient's Medications  New Prescriptions   No medications on file  Previous Medications   ACETAMINOPHEN (TYLENOL) 325 MG TABLET    Take 2 tablets (650 mg total) by mouth every 6 (six) hours as needed for mild pain, moderate pain, fever or headache.   APIXABAN (ELIQUIS) 5 MG TABS  TABLET    Take 2 tablets (10 mg total) by mouth 2 (two) times daily.   APIXABAN (ELIQUIS) 5 MG TABS TABLET    Take 1 tablet (5 mg total) by mouth 2 (two) times daily.   DONEPEZIL (ARICEPT) 10 MG TABLET    Take 1 tablet (10 mg total) by mouth at bedtime.   LIDOCAINE (LIDODERM) 5 %    Place 1 patch onto the skin daily. Remove & Discard patch within 12 hours or as directed by MD   METOPROLOL SUCCINATE (TOPROL-XL) 50 MG 24 HR TABLET    Take 1 tablet (50 mg total) by mouth daily. Take with or immediately following a meal.   PRAVASTATIN (PRAVACHOL) 20 MG TABLET    Take 1 tablet (20 mg total) by mouth daily at 6 PM.   RISPERIDONE (RISPERDAL) 1 MG TABLET    Take 1 mg by mouth 2 (two) times daily.   TRAZODONE (DESYREL) 50 MG TABLET    Take 1 tablet (50 mg total) by mouth at bedtime.  Modified Medications   No medications on file  Discontinued Medications   MISC. DEVICES (3-IN-1 BEDSIDE TOILET) MISC  1 Units by Does not apply route once.     SIGNIFICANT DIAGNOSTIC EXAMS  02-17-16: ct of head: 1. No acute intracranial findings. 2. Marked atrophy and moderate white matter microvascular disease.   02-18-16: mri/mra of head: 1.  No acute intracranial abnormality. 2. Chronic posterior right MCA infarct with superimposed bilateral white matter changes most suggestive of chronic small vessel disease. 3.  Negative intracranial MRA aside from mild arterial tortuosity. 4. Focal hyperostosis of the right frontal bone versus small right frontal meningioma, appears inconsequential.  02-18-16: carotid ultrasound: Bilateral: intimal wall thickening CCA. Mild soft plaque origin ICA. 1-39% ICA plaquing. Vertebral artery flow is antegrade.  02-19-16: ct of chest: Filling defects in multiple bilateral segmental and subsegmental pulmonary arteries consistent with pulmonary embolus. Clot burden slow. Diffuse cardiac enlargement likely represents pre-existing heart failure. Small pericardial effusion. Left thyroid gland  nodule. Dilated pancreatic duct with possible low-attenuation lesion in the pancreatic tail.   02-19-16: 2-d echo: Left ventricle: The cavity size was normal. Systolic function was normal. The estimated ejection fraction was in the range of 55% to 60%. Wall motion was normal; there were no regional wall motion abnormalities. Doppler parameters are consistent with abnormal left ventricular relaxation (grade 1 diastolic dysfunction). - Tricuspid valve: There was moderate regurgitation directed centrally. - Pulmonary arteries: PA peak pressure: 33 mm Hg (S). - Pericardium, extracardiac: A small to moderate, free-flowing pericardial effusion was identified circumferential to the heart. There was no evidence of hemodynamic compromise.  02-20-16: ct of abdomen and pelvis: 1. 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 Mild main pancreatic duct dilation. A follow-up pancreas protocol MRI (preferred if the patient can breath-hold) or CT abdomen without and with IV contrast is recommended in 12 months. This recommendation follows ACR consensus guidelines: Managing Incidental Findings on Abdominal CT: White Paper of the ACR Incidental Findings Committee. J Am Coll Radiol 2010;7:754-773. 2. Small moderate pericardial effusion/thickening. 3. Cholelithiasis.  No biliary ductal dilatation. 4. Moderate sigmoid diverticulosis. 5. Aortic atherosclerosis. 6. Myomatous uterus.   02-21-16: pelvic and bilateral hip x-ray: No evidence of bilateral hip fracture. Moderate osteoarthritic changes of bilateral hips.    LABS REVIEWED:   02-17-16: wbc 7.0; hgb 11.9; hct 36.1; mcv 88.5; plt 157; glucose 93; bun 14; creat 1.19; k+ 3.5; na++ 138; liver normal albumin 3.5; urine culture: multiple species 02-18-16; hgb a1c 5.6; chol 214; ldl 142; trig 90; hdl 54; vit  B 12: 353; ammonia 22; tsh 1.211; RPR: nr; HIV: nr 02-20-16; wbc 9.2; hgb 13.2; hct 39.9; mcv 87.9; plt 172; glucose 100; bun 8; creat 0.94; k+ 4.0; na++ 140; mag 1.6     Review of Systems  Unable to perform ROS: Dementia    Physical Exam  Constitutional: No distress.  Frail   Eyes: Conjunctivae are normal.  Neck: Neck supple. No JVD present. No thyromegaly present.  Cardiovascular: Normal rate, regular rhythm and intact distal pulses.   Respiratory: Effort normal and breath sounds normal. No respiratory distress. She has no wheezes.  GI: Soft. Bowel sounds are normal. She exhibits no distension. There is no tenderness.  Musculoskeletal: She exhibits no edema.  Able to move all extremities   Lymphadenopathy:    She has no cervical adenopathy.  Neurological: She is alert.  Skin: Skin is warm and dry. She is not diaphoretic.  Psychiatric: She has a normal mood and affect.  ASSESSMENT/ PLAN:  1. Bilateral pulmonary emboli: is stable at this time; will continue eliquis 10 mg twice daily through 02-29-16 then 5 mg twice daily will monitor  2. Vascular dementia; her current weight is 107 pounds; will continue aricept 10 mg nightly  3. Dyslipidemia: will continue pravachol 20 mg daily ldl 142  4. Hypertension: will continue toprol xl 50 mg daily  5. Diastolic heart failure: does have a pericardial effusion will continue toprol xl 50 mg daily  6. COPD: is presently without change will continue to monitor  7. Thyroid nodule: tsh is 1.211  8. Dementia: will continue aricept 10 mg daily weight is 107 pounds  9. CKD stage III: bun/creat 8/0.94  10. Anemia: hgb is 13.2  11.  Psychosis: will continue risperdal 1 mg twice daily will continue trazodone 50 mg nightly      Time spent with patient  50  minutes >50% time spent counseling; reviewing medical record; tests; labs; and developing future plan of care      Roberta Innocenteborah Green NP [facility]  Contact  [phone] Monday through Friday 8am- 5pm  After hours call [phone]

## 2016-02-25 LAB — BASIC METABOLIC PANEL
BUN: 18 mg/dL (ref 4–21)
Creatinine: 1.3 mg/dL — AB (ref 0.5–1.1)
Glucose: 101 mg/dL
Potassium: 3.9 mmol/L (ref 3.4–5.3)
Sodium: 142 mmol/L (ref 137–147)

## 2016-02-25 LAB — CBC AND DIFFERENTIAL
HCT: 40 % (ref 36–46)
Hemoglobin: 12.4 g/dL (ref 12.0–16.0)
Platelets: 208 10*3/uL (ref 150–399)
WBC: 8.6 10^3/mL

## 2016-03-01 ENCOUNTER — Encounter: Payer: Self-pay | Admitting: Internal Medicine

## 2016-03-01 ENCOUNTER — Non-Acute Institutional Stay (SKILLED_NURSING_FACILITY): Payer: Medicare Other | Admitting: Internal Medicine

## 2016-03-01 DIAGNOSIS — R938 Abnormal findings on diagnostic imaging of other specified body structures: Secondary | ICD-10-CM | POA: Diagnosis not present

## 2016-03-01 DIAGNOSIS — I10 Essential (primary) hypertension: Secondary | ICD-10-CM | POA: Diagnosis not present

## 2016-03-01 DIAGNOSIS — N183 Chronic kidney disease, stage 3 unspecified: Secondary | ICD-10-CM

## 2016-03-01 DIAGNOSIS — G4700 Insomnia, unspecified: Secondary | ICD-10-CM | POA: Diagnosis not present

## 2016-03-01 DIAGNOSIS — G9340 Encephalopathy, unspecified: Secondary | ICD-10-CM | POA: Diagnosis not present

## 2016-03-01 DIAGNOSIS — I3139 Other pericardial effusion (noninflammatory): Secondary | ICD-10-CM

## 2016-03-01 DIAGNOSIS — F0390 Unspecified dementia without behavioral disturbance: Secondary | ICD-10-CM

## 2016-03-01 DIAGNOSIS — E041 Nontoxic single thyroid nodule: Secondary | ICD-10-CM

## 2016-03-01 DIAGNOSIS — R9389 Abnormal findings on diagnostic imaging of other specified body structures: Secondary | ICD-10-CM

## 2016-03-01 DIAGNOSIS — K8689 Other specified diseases of pancreas: Secondary | ICD-10-CM

## 2016-03-01 DIAGNOSIS — R1310 Dysphagia, unspecified: Secondary | ICD-10-CM

## 2016-03-01 DIAGNOSIS — I319 Disease of pericardium, unspecified: Secondary | ICD-10-CM

## 2016-03-01 DIAGNOSIS — K869 Disease of pancreas, unspecified: Secondary | ICD-10-CM

## 2016-03-01 DIAGNOSIS — I2699 Other pulmonary embolism without acute cor pulmonale: Secondary | ICD-10-CM

## 2016-03-01 DIAGNOSIS — I5032 Chronic diastolic (congestive) heart failure: Secondary | ICD-10-CM | POA: Diagnosis not present

## 2016-03-01 DIAGNOSIS — R471 Dysarthria and anarthria: Secondary | ICD-10-CM

## 2016-03-01 DIAGNOSIS — M25551 Pain in right hip: Secondary | ICD-10-CM

## 2016-03-01 DIAGNOSIS — E785 Hyperlipidemia, unspecified: Secondary | ICD-10-CM | POA: Diagnosis not present

## 2016-03-01 DIAGNOSIS — E878 Other disorders of electrolyte and fluid balance, not elsewhere classified: Secondary | ICD-10-CM

## 2016-03-01 DIAGNOSIS — I313 Pericardial effusion (noninflammatory): Secondary | ICD-10-CM

## 2016-03-01 NOTE — Progress Notes (Signed)
MRN: [id number] Name: Roberta Griffin  Sex: female Age: 82 y.o. DOB: 1926-02-27  PSC #:  Facility/Room: [facility] / 128 A Level Of Care: SNF Provider: Randon Goldsmith. Lyn Hollingshead, MD Emergency Contacts: Extended Emergency Contact Information Primary Emergency Contact: [name] [place] Home Phone: [phone] Relation: Daughter Secondary Emergency Contact: [name],[name]  [place] Mobile Phone: [phone] Relation: Grandaughter  Code Status: DNR  Allergies: Review of patient's allergies indicates no known allergies.  Chief Complaint  Patient presents with  . New Admit To SNF    Admit to Facility    HPI: Patient is 82 y.o. female with dementia, hypertension, CKD presented with 2-3 day history of increasing falls. Due to the patient's dementia, she has not provided any significant history. According to the patient's daughter, the patient developed increasing dysarthria and dysphasia on 02/17/2016. There was concern for right facial droop as well as right upper extremity dysesthesias. In the emergency department, CT of the brain was negative. Neurology was consulted,pt was admitted to [facility] from 7/7-11 and full stroke workup was undertaken. Ultimately pt was dx not with a stroke, w/u neg, but with B PE. There were incidental fidings of pancreatic cysts. Pt is admitted to SNF with generalized weakness. While at SNF pt will be followed for chronic CHF, tx with metoprolol, HTN, tx with metoprolol and insomnia , tx with trazodone.  Past Medical History  Diagnosis Date  . Scoliosis   . Dementia   . COPD (chronic obstructive pulmonary disease) (HCC)   . Hypertension   . Dysphagia   . Depression     Past Surgical History  Procedure Laterality Date  . Shoulder surgery    . Knee surgery    . Abdominal hysterectomy    . Vitrectomy and cataract        Medication List       This list is accurate as of: 03/01/16 11:22 AM.  Always use your most  recent med list.               acetaminophen 325 MG tablet  Commonly known as:  TYLENOL  Take 2 tablets (650 mg total) by mouth every 6 (six) hours as needed for mild pain, moderate pain, fever or headache.     apixaban 5 MG Tabs tablet  Commonly known as:  ELIQUIS  Take 2 tablets (10 mg total) by mouth 2 (two) times daily.     apixaban 5 MG Tabs tablet  Commonly known as:  ELIQUIS  Take 1 tablet (5 mg total) by mouth 2 (two) times daily.     donepezil 10 MG tablet  Commonly known as:  ARICEPT  Take 1 tablet (10 mg total) by mouth at bedtime.     lidocaine 5 %  Commonly known as:  LIDODERM  Place 1 patch onto the skin daily. Remove & Discard patch within 12 hours or as directed by MD     metoprolol succinate 50 MG 24 hr tablet  Commonly known as:  TOPROL-XL  Take 1 tablet (50 mg total) by mouth daily. Take with or immediately following a meal.     pravastatin 20 MG tablet  Commonly known as:  PRAVACHOL  Take 1 tablet (20 mg total) by mouth daily at 6 PM.     risperiDONE 1 MG tablet  Commonly known as:  RISPERDAL  Take 1 mg by mouth 2 (two) times daily.     traMADol 50 MG tablet  Commonly known as:  ULTRAM  Take  50 mg by mouth every 6 (six) hours as needed for moderate pain.     traZODone 50 MG tablet  Commonly known as:  DESYREL  Take 1 tablet (50 mg total) by mouth at bedtime.        Meds ordered this encounter  Medications  . traMADol (ULTRAM) 50 MG tablet    Sig: Take 50 mg by mouth every 6 (six) hours as needed for moderate pain.    Immunization History  Administered Date(s) Administered  . PPD Test 02/24/2016    Social History  Substance Use Topics  . Smoking status: Never Smoker   . Smokeless tobacco: Not on file  . Alcohol Use: No    Family history is   Family History  Problem Relation Age of Onset  . Stroke Neg Hx       Review of Systems  DATA OBTAINED: from patient, nurse GENERAL:  no fevers, fatigue, appetite changes SKIN: No  itching, rash or wounds EYES: No eye pain, redness, discharge EARS: No earache, tinnitus, change in hearing NOSE: No congestion, drainage or bleeding  MOUTH/THROAT: No mouth or tooth pain, No sore throat RESPIRATORY: No cough, wheezing, SOB CARDIAC: No chest pain, palpitations, lower extremity edema  GI: No abdominal pain, No N/V/D or constipation, No heartburn or reflux  GU: No dysuria, frequency or urgency, or incontinence  MUSCULOSKELETAL: No unrelieved bone/joint pain NEUROLOGIC: No headache, dizziness or focal weakness PSYCHIATRIC: No c/o anxiety or sadness   Filed Vitals:   03/01/16 1109  BP: 120/78  Pulse: 95  Temp: 97.9 F (36.6 C)  Resp: 17    SpO2 Readings from Last 1 Encounters:  03/01/16 95%        Physical Exam  GENERAL APPEARANCE: Alert,  No acute distress.  SKIN: No diaphoresis rash HEAD: Normocephalic, atraumatic  EYES: Conjunctiva/lids clear. Pupils round, reactive. EOMs intact.  EARS: External exam WNL, canals clear. Hearing grossly normal.  NOSE: No deformity or discharge.  MOUTH/THROAT: Lips w/o lesions  RESPIRATORY: Breathing is even, unlabored. Lung sounds are clear   CARDIOVASCULAR: Heart RRR no murmurs, rubs or gallops. No peripheral edema.   GASTROINTESTINAL: Abdomen is soft, non-tender, not distended w/ normal bowel sounds. GENITOURINARY: Bladder non tender, not distended  MUSCULOSKELETAL: No abnormal joints or musculature NEUROLOGIC:  Cranial nerves 2-12 grossly intact. Moves all extremities  PSYCHIATRIC: Mood and affect appropriate to situation with dementia, no behavioral issues  Patient Active Problem List   Diagnosis Date Noted  . Acute pulmonary embolism (HCC) 02/20/2016  . Pancreatic mass   . Abnormal CXR   . HLD (hyperlipidemia)   . Acute encephalopathy 02/18/2016  . Dysphagia 02/18/2016  . Acute ischemic stroke (HCC) 02/17/2016  . COPD (chronic obstructive pulmonary disease) (HCC) 02/17/2016  . Dementia 02/17/2016  .  Essential hypertension 02/17/2016  . Normocytic anemia 02/17/2016  . CKD (chronic kidney disease), stage III 02/17/2016       Component Value Date/Time   WBC 8.9 02/21/2016 0632   WBC 8.9 02/21/2016   RBC 4.89 02/21/2016 0632   HGB 14.2 02/21/2016 0632   HCT 42.8 02/21/2016 0632   HCT 38.3 02/18/2016 1447   PLT 215 02/21/2016 0632   MCV 87.5 02/21/2016 0632   LYMPHSABS 1.5 02/17/2016 1746   MONOABS 0.6 02/17/2016 1746   EOSABS 0.1 02/17/2016 1746   BASOSABS 0.0 02/17/2016 1746        Component Value Date/Time   NA 140 02/22/2016 0648   NA 142 02/20/2016   K 4.1 02/22/2016  0648   CL 110 02/22/2016 0648   CO2 22 02/22/2016 0648   GLUCOSE 85 02/22/2016 0648   BUN 11 02/22/2016 0648   BUN 8 02/20/2016   CREATININE 1.02* 02/22/2016 0648   CREATININE 0.9 02/20/2016   CALCIUM 9.1 02/22/2016 0648   PROT 6.4* 02/17/2016 1746   ALBUMIN 3.5 02/17/2016 1746   AST 19 02/17/2016 1746   ALT 7* 02/17/2016 1746   ALKPHOS 46 02/17/2016 1746   BILITOT 0.3 02/17/2016 1746   GFRNONAA 48* 02/22/2016 0648   GFRAA 55* 02/22/2016 0648    Lab Results  Component Value Date   HGBA1C 5.6 02/18/2016    Lab Results  Component Value Date   CHOL 214* 02/18/2016   HDL 54 02/18/2016   LDLCALC 142* 02/18/2016   TRIG 90 02/18/2016   CHOLHDL 4.0 02/18/2016     Ct Head Wo Contrast  02/17/2016  CLINICAL DATA:  Per pt daughter, c/o slurred speech for a couple of days. Pt family also states the L side of her face has been drooping. Pt has alzheimers and dementia, baseline is confused. Pt answered "11" for how old she was. Pt smiling. EXAM: CT HEAD WITHOUT CONTRAST TECHNIQUE: Contiguous axial images were obtained from the base of the skull through the vertex without intravenous contrast. COMPARISON:  None. FINDINGS: No acute intracranial hemorrhage. No focal mass lesion. No CT evidence of acute infarction. No midline shift or mass effect. No hydrocephalus. Basilar cisterns are patent. There are  periventricular and subcortical white matter hypodensities. Generalized cortical atrophy. Paranasal sinuses and mastoid air cells are clear. Orbits are clear. IMPRESSION: 1. No acute intracranial findings. 2. Marked atrophy and moderate white matter microvascular disease. Electronically Signed   By: Genevive Bi M.D.   On: 02/17/2016 19:13   Mr Brain Wo Contrast  02/18/2016  CLINICAL DATA:  82 year old female with increasing difficulty with speech and swallowing for 1 week. Facial droop. Increased falls. Initial encounter. EXAM: MRI HEAD WITHOUT CONTRAST MRA HEAD WITHOUT CONTRAST TECHNIQUE: Multiplanar, multiecho pulse sequences of the brain and surrounding structures were obtained without intravenous contrast. Angiographic images of the head were obtained using MRA technique without contrast. COMPARISON:  Noncontrast head CT 02/17/2016 FINDINGS: MRI HEAD FINDINGS Metal susceptibility artifact related to a tiny retained metal foreign body in the scalp (series 3, image 28 of the comparison) along the left posterior convexity mildly degrades diffusion weighted imaging. No convincing restricted diffusion or evidence of acute infarction. Major intracranial vascular flow voids are preserved. Mild intracranial artery dolichoectasia. No midline shift, mass effect, ventriculomegaly, extra-axial collection or acute intracranial hemorrhage. Cervicomedullary junction and pituitary are within normal limits. Negative for age visualized cervical spine. Normal bone marrow signal. There is prominent focal hyperostosis of the right frontal bone versus a small calcified meningioma (series 14, image 22). Minimal mass effect on the adjacent right frontal lobe with no cerebral edema. Small to moderate area of cortical encephalomalacia along the right peri-Rolandic cortex best seen on series 14, image 12. No chronic cerebral blood products identified. Underlying confluent cerebral white matter T2 and FLAIR hyperintensity, in  addition to patchy bilateral similar white matter changes. Deep gray matter nuclei, brainstem, and cerebellum are normal for age. Visible internal auditory structures appear normal. Mastoids are clear. No skullbase abnormality identified. Paranasal sinuses are clear. Postoperative changes to the bilateral globes. Negative scalp soft tissues. MRA HEAD FINDINGS Antegrade flow in the posterior circulation with codominant distal vertebral arteries. Normal PICA origins and no distal vertebral stenosis. Patent vertebrobasilar  junction and basilar artery without stenosis. Mild ectasia at the basilar tip. SCA and PCA origins are normal. Left posterior communicating artery is present, the right is diminutive or absent. Tortuous left P1 segment. Bilateral PCA branches are normal. Antegrade flow in both ICA siphons. No siphon stenosis. Normal ophthalmic artery origins. Normal MCA and ACA origins. Tortuous A1 segments. Anterior communicating artery and visualized ACA branches are within normal limits. Mild MCA tortuosity. M1 segments and visualized bilateral MCA branches otherwise are normal. IMPRESSION: 1.  No acute intracranial abnormality. 2. Chronic posterior right MCA infarct with superimposed bilateral white matter changes most suggestive of chronic small vessel disease. 3.  Negative intracranial MRA aside from mild arterial tortuosity. 4. Focal hyperostosis of the right frontal bone versus small right frontal meningioma, appears inconsequential. Electronically Signed   By: Odessa Fleming M.D.   On: 02/18/2016 08:19   Dg Chest Port 1 View  02/18/2016  CLINICAL DATA:  82 year old female with a history of difficulty speaking and falling EXAM: PORTABLE CHEST 1 VIEW COMPARISON:  None. FINDINGS: Cardiomediastinal silhouette enlarged. Calcifications of the aortic arch. No evidence of central vascular congestion. Soft tissue density projects over the upper right mediastinal border, crossing the contours of the clavicular head and  upper posterior ribs. Uplifting of the minor fissure No pneumothorax or pleural effusion.  No confluent airspace disease. Surgical changes of prior bilateral reverse shoulder arthroplasty. No displaced fracture. IMPRESSION: No radiographic evidence of acute cardiopulmonary disease. Soft tissue density of the upper right mediastinal border. There is associated uplifting of the minor fissure. Findings are concerning for right upper lobe partial volume loss, and a central obstructing tumor is not excluded. Further evaluation with contrast-enhanced CT is recommended. These results will be called to the ordering clinician or representative by the Radiologist Assistant, and communication documented in the PACS or zVision Dashboard. Cardiomegaly. Aortic atherosclerosis. Signed, Yvone Neu. Loreta Ave, DO [facility] Radiology Electronically Signed   By: Gilmer Mor D.O.   On: 02/18/2016 10:45   Mr Maxine Glenn Head/brain Wo Cm  02/18/2016  CLINICAL DATA:  82 year old female with increasing difficulty with speech and swallowing for 1 week. Facial droop. Increased falls. Initial encounter. EXAM: MRI HEAD WITHOUT CONTRAST MRA HEAD WITHOUT CONTRAST TECHNIQUE: Multiplanar, multiecho pulse sequences of the brain and surrounding structures were obtained without intravenous contrast. Angiographic images of the head were obtained using MRA technique without contrast. COMPARISON:  Noncontrast head CT 02/17/2016 FINDINGS: MRI HEAD FINDINGS Metal susceptibility artifact related to a tiny retained metal foreign body in the scalp (series 3, image 28 of the comparison) along the left posterior convexity mildly degrades diffusion weighted imaging. No convincing restricted diffusion or evidence of acute infarction. Major intracranial vascular flow voids are preserved. Mild intracranial artery dolichoectasia. No midline shift, mass effect, ventriculomegaly, extra-axial collection or acute intracranial  hemorrhage. Cervicomedullary junction and pituitary are within normal limits. Negative for age visualized cervical spine. Normal bone marrow signal. There is prominent focal hyperostosis of the right frontal bone versus a small calcified meningioma (series 14, image 22). Minimal mass effect on the adjacent right frontal lobe with no cerebral edema. Small to moderate area of cortical encephalomalacia along the right peri-Rolandic cortex best seen on series 14, image 12. No chronic cerebral blood products identified. Underlying confluent cerebral white matter T2 and FLAIR hyperintensity, in addition to patchy bilateral similar white matter changes. Deep gray matter nuclei, brainstem, and cerebellum are normal for age. Visible internal auditory structures appear normal. Mastoids are  clear. No skullbase abnormality identified. Paranasal sinuses are clear. Postoperative changes to the bilateral globes. Negative scalp soft tissues. MRA HEAD FINDINGS Antegrade flow in the posterior circulation with codominant distal vertebral arteries. Normal PICA origins and no distal vertebral stenosis. Patent vertebrobasilar junction and basilar artery without stenosis. Mild ectasia at the basilar tip. SCA and PCA origins are normal. Left posterior communicating artery is present, the right is diminutive or absent. Tortuous left P1 segment. Bilateral PCA branches are normal. Antegrade flow in both ICA siphons. No siphon stenosis. Normal ophthalmic artery origins. Normal MCA and ACA origins. Tortuous A1 segments. Anterior communicating artery and visualized ACA branches are within normal limits. Mild MCA tortuosity. M1 segments and visualized bilateral MCA branches otherwise are normal. IMPRESSION: 1.  No acute intracranial abnormality. 2. Chronic posterior right MCA infarct with superimposed bilateral white matter changes most suggestive of chronic small vessel disease. 3.  Negative intracranial MRA aside from mild arterial tortuosity.  4. Focal hyperostosis of the right frontal bone versus small right frontal meningioma, appears inconsequential. Electronically Signed   By: Odessa Fleming M.D.   On: 02/18/2016 08:19    Not all labs, radiology exams or other studies done during hospitalization come through on my EPIC note; however they are reviewed by me.    Assessment and Plan  Acute encephalopathy -Suspect this may actually represent continued functional and cognitive decline due to the patient's underlying dementia -Serum B12--353 -TSH--1.211 -RBC folate--857 -RPR--neg -Ammonia--22 -Urinalysis negative for pyuria - EKG--sinus rhythm, RBBB - Mental status may now be back to her baseline. SNF - monitor MS  Acute pulmonary emboli -IV heparin initially started. -02/19/2016 after EF 55-60%, grade 1 DD, normal RV function -D/C aspirin 81 mg daily as anticoagulation started -transitioned IV heparin to po apixaban SNF - cont Eliquis 10 mg bid x 7 days starting 02/21/16, then 5 mg bid starting 02/29/16  Pancreatic tail lesions/masses -CT abdomen and pelvis--Multiple similar small low-attenuation pancreatic lesions scattered in the pancreatic head and tail without appreciable enhancement, consistent with nonaggressive cystic pancreatic lesions, 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  - As per GI consultation: Cystic lesions of the pancreas: There are multiple lesions in there consistent with IPMNs. These have a fairly low potential for malignancy. There are no solid masses of the pancreas. It is recommended that these be followed up with repeat scanning in about 1 year. If that one of them appears to be enlarging biopsy could be considered at that time if clinically indicated based on all of her other problems. Asymptomatic gallstones. Dr. Randa Evens did not feel that she needs any further diagnostic workup at this time and suggested repeat imaging of the pancreas in 1 year if clinically  indicated.  Dysarthria/dysphagia/dysesthesias -02/18/2016 MRI brain negative for acute infarct -MRA brain negative -Carotid duplex negative for hemodynamically significant stenosis -EEG negative for seizure--mild diffuse slowing - Chest x-ray negative for infiltrates -LDL 142--started pravastatin per neurology -speech eval-->dys 3 diet thin liquid -echo--EF 55-60%, grade 1 DD SNF - ST will see and work with pt;dys 3 with thin liquids  Dementia SNF-Restart Aricept and Risperdal; Restart trazodone at bedtime; prn haldol used in hospital;good idea for SNF as well  Hypertension SNF -Restart metoprolol succinate; nifedipine was d/c  Renal insufficiency, unclear chronicity/Dehydration - Creatinine has normalized. SNF - will f/u BMP  Abnormal CXR -Soft tissue density of the upper right mediastinal border -CT chest as below  Hypokalemia/hypomagnesemia -repleted SNF - will f/u BMP plus Mg+  Hyperlipidemia SNF -started pravastatin per neurology;  will continue  Hip pain -xrays of pelvis--neg for fracture or dislocation SNF - Acetaminophen for pain. Avoid opioids or tramadol.  Pericardial effusion - Small to moderate pericardial effusion reported on CT abdomen and 2-D echo. On echo, free-flowing pericardial effusion circumferential to the heart without evidence of hemodynamic compromise. No clinical tamponade physiology either. Consider outpatient repeat echo in a couple of months versus earlier if patient becomes symptomatic.  Left thyroid gland nodule - Seen on CT chest. Outpatient follow-up and evaluation as deemed necessary. May not be candidate for aggressive intervention. - TSH 1.211.  CHRONIC DIASTOLIC CHF SNF - cont metoprolol  INSOMNIA SNF- cont trazodone 50 mg q H   Time spent > 45 min;> 50% of time with patient was spent reviewing records, labs, tests and studies, counseling and developing plan of care  Thurston Holenne D. Lyn HollingsheadAlexander, MD

## 2016-03-02 ENCOUNTER — Encounter: Payer: Self-pay | Admitting: Adult Health

## 2016-03-02 NOTE — Progress Notes (Signed)
Patient ID: Roberta Griffin, female   DOB: 05/22/1926, [age] y.o.   MRN: [id number]   Location:    [facility] Room Number: 128-A Place of Service:  SNF (31)    CODE STATUS: DNR  No Known Allergies  Chief Complaint  Patient presents with  . Discharge Note    Discharge from Facility    HPI:  Past Medical History  Diagnosis Date  . Scoliosis   . Dementia   . COPD (chronic obstructive pulmonary disease) (HCC)   . Hypertension   . Dysphagia   . Depression     Past Surgical History  Procedure Laterality Date  . Shoulder surgery    . Knee surgery    . Abdominal hysterectomy    . Vitrectomy and cataract      Social History   Social History  . Marital Status: Single    Spouse Name: N/A  . Number of Children: N/A  . Years of Education: N/A   Occupational History  . Not on file.   Social History Main Topics  . Smoking status: Never Smoker   . Smokeless tobacco: Not on file  . Alcohol Use: No  . Drug Use: No  . Sexual Activity: Not on file   Other Topics Concern  . Not on file   Social History Narrative   Family History  Problem Relation Age of Onset  . Stroke Neg Hx     VITAL SIGNS BP 128/63 mmHg  Pulse 82  Temp(Src) 97.3 F (36.3 C)  Resp 20  Ht 5\' 3"  (1.6 m)  Wt 109 lb (49.442 kg)  BMI 19.31 kg/m2  SpO2 95%  Patient's Medications  New Prescriptions   No medications on file  Previous Medications   ACETAMINOPHEN (TYLENOL) 325 MG TABLET    Take 2 tablets (650 mg total) by mouth every 6 (six) hours as needed for mild pain, moderate pain, fever or headache.   APIXABAN (ELIQUIS) 5 MG TABS TABLET    Take 1 tablet (5 mg total) by mouth 2 (two) times daily.   DONEPEZIL (ARICEPT) 10 MG TABLET    Take 1 tablet (10 mg total) by mouth at bedtime.   LIDOCAINE (LIDODERM) 5 %    Place 1 patch onto the skin daily. Remove & Discard patch within 12 hours or as directed by MD   METOPROLOL SUCCINATE (TOPROL-XL) 50 MG 24 HR TABLET    Take 1 tablet (50 mg  total) by mouth daily. Take with or immediately following a meal.   PRAVASTATIN (PRAVACHOL) 20 MG TABLET    Take 1 tablet (20 mg total) by mouth daily at 6 PM.   RISPERIDONE (RISPERDAL) 1 MG TABLET    Take 1 mg by mouth 2 (two) times daily.   TRAZODONE (DESYREL) 50 MG TABLET    Take 1 tablet (50 mg total) by mouth at bedtime.  Modified Medications   No medications on file  Discontinued Medications   APIXABAN (ELIQUIS) 5 MG TABS TABLET    Take 2 tablets (10 mg total) by mouth 2 (two) times daily.   TRAMADOL (ULTRAM) 50 MG TABLET    Take 50 mg by mouth every 6 (six) hours as needed for moderate pain. Reported on 03/02/2016     SIGNIFICANT DIAGNOSTIC EXAMS       ASSESSMENT/ PLAN:      Patient is being discharged with the following home health services:    Patient is being discharged with the following durable medical equipment:  Patient has been advised to f/u with their PCP in 1-2 weeks to bring them up to date on their rehab stay.  Social services at facility was responsible for arranging this appointment.  Pt was provided with a 30 day supply of prescriptions for medications and refills must be obtained from their PCP.  For controlled substances, a more limited supply may be provided adequate until PCP appointment only.   Synthia Innocent NP [facility]  Contact [phone] Monday through Friday 8am- 5pm  After hours call [phone]   This encounter was created in error - please disregard.

## 2016-03-04 DIAGNOSIS — I3139 Other pericardial effusion (noninflammatory): Secondary | ICD-10-CM | POA: Insufficient documentation

## 2016-03-04 DIAGNOSIS — I5032 Chronic diastolic (congestive) heart failure: Secondary | ICD-10-CM | POA: Insufficient documentation

## 2016-03-04 DIAGNOSIS — I313 Pericardial effusion (noninflammatory): Secondary | ICD-10-CM | POA: Insufficient documentation

## 2016-03-04 DIAGNOSIS — G4700 Insomnia, unspecified: Secondary | ICD-10-CM | POA: Insufficient documentation

## 2016-03-04 DIAGNOSIS — R471 Dysarthria and anarthria: Secondary | ICD-10-CM | POA: Insufficient documentation

## 2016-03-04 DIAGNOSIS — E878 Other disorders of electrolyte and fluid balance, not elsewhere classified: Secondary | ICD-10-CM | POA: Insufficient documentation

## 2016-03-04 DIAGNOSIS — M25559 Pain in unspecified hip: Secondary | ICD-10-CM | POA: Insufficient documentation

## 2016-03-04 DIAGNOSIS — E041 Nontoxic single thyroid nodule: Secondary | ICD-10-CM | POA: Insufficient documentation

## 2016-03-26 DIAGNOSIS — F0390 Unspecified dementia without behavioral disturbance: Secondary | ICD-10-CM | POA: Diagnosis not present

## 2016-03-26 DIAGNOSIS — F29 Unspecified psychosis not due to a substance or known physiological condition: Secondary | ICD-10-CM | POA: Diagnosis not present

## 2016-03-26 DIAGNOSIS — G4700 Insomnia, unspecified: Secondary | ICD-10-CM | POA: Diagnosis not present

## 2016-04-06 DIAGNOSIS — F29 Unspecified psychosis not due to a substance or known physiological condition: Secondary | ICD-10-CM | POA: Diagnosis not present

## 2016-04-06 DIAGNOSIS — G4700 Insomnia, unspecified: Secondary | ICD-10-CM | POA: Diagnosis not present

## 2016-04-06 DIAGNOSIS — F0390 Unspecified dementia without behavioral disturbance: Secondary | ICD-10-CM | POA: Diagnosis not present

## 2016-04-13 ENCOUNTER — Encounter: Payer: Self-pay | Admitting: Internal Medicine

## 2016-04-13 ENCOUNTER — Non-Acute Institutional Stay (SKILLED_NURSING_FACILITY): Payer: Medicare Other | Admitting: Internal Medicine

## 2016-04-13 DIAGNOSIS — N183 Chronic kidney disease, stage 3 unspecified: Secondary | ICD-10-CM

## 2016-04-13 DIAGNOSIS — I5032 Chronic diastolic (congestive) heart failure: Secondary | ICD-10-CM | POA: Diagnosis not present

## 2016-04-13 DIAGNOSIS — F0390 Unspecified dementia without behavioral disturbance: Secondary | ICD-10-CM

## 2016-04-13 DIAGNOSIS — J438 Other emphysema: Secondary | ICD-10-CM

## 2016-04-13 DIAGNOSIS — I2699 Other pulmonary embolism without acute cor pulmonale: Secondary | ICD-10-CM | POA: Diagnosis not present

## 2016-04-13 DIAGNOSIS — D649 Anemia, unspecified: Secondary | ICD-10-CM

## 2016-04-13 DIAGNOSIS — I10 Essential (primary) hypertension: Secondary | ICD-10-CM

## 2016-04-13 NOTE — Progress Notes (Signed)
Location:    [facility] Room Number: 228/A Place of Service:  SNF (31) Provider:  Edmon CrapeArlo Aleeah Greeno  No primary care provider on file.  No care team member to display  Extended Emergency Contact Information Primary Emergency Contact: Lorain ChildesAnderson,Sheila  [place] Home Phone: [phone] Relation: Daughter Secondary Emergency Contact: [name],[name]  [place] Mobile Phone: [phone] Relation: Grandaughter  Code Status:  DNR/MOST Goals of care: Advanced Directive information Advanced Directives 04/13/2016  Does patient have an advance directive? Yes  Type of Advance Directive Out of facility DNR (pink MOST or yellow form)  Does patient want to make changes to advanced directive? No - Patient declined  Copy of advanced directive(s) in chart? Yes  Would patient like information on creating an advanced directive? -     Chief Complaint  Patient presents with  . Medical Management of Chronic Issues    Routine Visit   Medical management of chronic medical conditions including recent history of bilateral pulmonary embolisms-dementia-hypertension-diastolic CHF-COPD-chronic kidney disease-anemia- HPI:  Pt is a 82 y.o. female seen today for medical management of chronic diseases.   As noted above.  Nursing staff does not report any concerns patient has significant dementia and cannot really give a review of systems.  Her weight appears to be stable to slowly increasing.  She was recently hospitalized because of falls at home with increased weakness and right facial droop-there was concern for CVA however workup was negative she was found to have bilateral pulmonary emboli.  She is on  Eliquist and appears to be doing well in this regards.  She does have a history of hypertension on Toprol-XL 50 mg a day and this appears to be relatively stable recent blood pressures 130/70-134/60-146/70 although I do not see consistent systolic  elevations above 140.  She does have a history COPD but this appears to be stable no reports of increased cough or shortness of breath.  She also has this history of diastolic CHF at this is been stable I suspect her weight gain is more appetite related she does not have significant edema or chest congestion she is on a beta blocker.  I do note in the hospital she was found to have a thyroid nodule however TSH was within normal limits.  She also has a history of red pancreatic lesion-recommendation is to follow this clinically and possible follow up study in one year if clinically indicated    Past Medical History:  Diagnosis Date  . COPD (chronic obstructive pulmonary disease) (HCC)   . Dementia   . Depression   . Dysphagia   . Hypertension   . Scoliosis    Past Surgical History:  Procedure Laterality Date  . ABDOMINAL HYSTERECTOMY    . KNEE SURGERY    . SHOULDER SURGERY    . VITRECTOMY AND CATARACT      No Known Allergies    Medication List       Accurate as of 04/13/16  2:30 PM. Always use your most recent med list.          acetaminophen 325 MG tablet Commonly known as:  TYLENOL Take 2 tablets (650 mg total) by mouth every 6 (six) hours as needed for mild pain, moderate pain, fever or headache.   apixaban 5 MG Tabs tablet Commonly known as:  ELIQUIS Take 1 tablet (5 mg total) by mouth 2 (two) times daily.   donepezil 10 MG tablet Commonly known as:  ARICEPT Take 1 tablet (  10 mg total) by mouth at bedtime.   lidocaine 5 % Commonly known as:  LIDODERM Place 1 patch onto the skin daily. Remove & Discard patch within 12 hours or as directed by MD   metoprolol succinate 50 MG 24 hr tablet Commonly known as:  TOPROL-XL Take 1 tablet (50 mg total) by mouth daily. Take with or immediately following a meal.   MILK OF MAGNESIA PO Give 30 ml by mouth every 24 hours as needed for bowel management   pravastatin 20 MG tablet Commonly known as:  PRAVACHOL Take 1  tablet (20 mg total) by mouth daily at 6 PM.   risperiDONE 0.5 MG tablet Commonly known as:  RISPERDAL Take 0.5 mg by mouth at bedtime.   traZODone 50 MG tablet Commonly known as:  DESYREL Take 1 tablet (50 mg total) by mouth at bedtime.       Review of Systems   As stated in history of present illness again cannot really obtain review of systems secondary to severe dementia  Immunization History  Administered Date(s) Administered  . PPD Test 02/24/2016   Pertinent  Health Maintenance Due  Topic Date Due  . DEXA SCAN  01/14/1993  . PNA vac Low Risk Adult (1 of 2 - PCV13) 01/14/1993  . INFLUENZA VACCINE  07/13/2016 (Originally 03/13/2016)   No flowsheet data found. Functional Status Survey:    Vitals:   04/13/16 1414  BP: (!) 146/78  Pulse: 74  Resp: 20  Temp: 97.3 F (36.3 C)  TempSrc: Oral  SpO2: 99%  Weight: 112 lb 9.6 oz (51.1 kg)  Height: 5\' 3"  (1.6 m)  Again blood pressures appear to be largely in the 130s recently occasionally above 140 but not consistent Body mass index is 19.95 kg/m. Physical Exam Constitutional: No distress. Frail elderly female   Eyes: Conjunctivae are normal.  Neck: Neck supple. No JVD present. No thyromegaly present.  Cardiovascular: Normal rate, regular rhythm and intact distal pulses. No significant lower extremity edema   Respiratory: Effort normal and breath sounds normal. No respiratory distress.   GI: Soft. Bowel sounds are normal. She exhibits no distension. There is no tenderness.  Musculoskeletal: She exhibits no edema.  Able to move all extremities   with general frailty    Neurological: She is alert. Extremities 4 no lateralizing findings cranial nerves appear to be grossly intact  Skin: Skin is warm and dry. She is not diaphoretic.  Psychiatric: She has a normal mood and affect. Oriented to self  Labs reviewed:   Recent Labs  02/19/16 0316  02/20/16 0515 02/22/16 0648 02/25/16  NA 140  < > 142 140 142  K 3.0*   < > 4.0 4.1 3.9  CL 106  --  110 110  --   CO2 25  --  24 22  --   GLUCOSE 112*  --  100* 85  --   BUN 10  < > 8 11 18   CREATININE 1.06*  < > 0.94 1.02* 1.3*  CALCIUM 9.5  --  9.3 9.1  --   MG  --   --  1.6* 2.1  --   < > = values in this interval not displayed.  Recent Labs  02/17/16 1746  AST 19  ALT 7*  ALKPHOS 46  BILITOT 0.3  PROT 6.4*  ALBUMIN 3.5    Recent Labs  02/17/16 1746  02/20/16 0515 02/21/16 02/21/16 0632 02/25/16  WBC 7.0  < > 9.2 8.9 8.9 8.6  NEUTROABS 4.8  --   --   --   --   --   HGB 11.9*  < > 13.2  --  14.2 12.4  HCT 36.1  < > 39.9  --  42.8 40  MCV 88.5  --  87.9  --  87.5  --   PLT 157  < > 172  --  215 208  < > = values in this interval not displayed. Lab Results  Component Value Date   TSH 1.211 02/18/2016   Lab Results  Component Value Date   HGBA1C 5.6 02/18/2016   Lab Results  Component Value Date   CHOL 214 (H) 02/18/2016   HDL 54 02/18/2016   LDLCALC 142 (H) 02/18/2016   TRIG 90 02/18/2016   CHOLHDL 4.0 02/18/2016    Significant Diagnostic Results in last 30 days:  No results found.  Assessment/Plan  #1 history of bilateral pulmonary embolisms this appears stable on Eliquist 5 mg by mouth twice a day.  #2 dementia this appears stable she is on Aricept weight appears to be slowly increasing she is doing well with supportive care. She continues on trazodone as needed for insomnia-as well as Risperdal daily at bedtime secondary to history of psychosis which has been stable   #3 hypertension this appears stable on Toprol-XL somewhat variable systolics as noted above but no consistent elevations above 140.  #4 history of diastolic CHF-continues on a beta blocker as noted above this appears to be stable.  #5 COPD this appears stable no evidence of increased cough or significant chest congestion.  #6 history of thyroid nodule-TSH has been within normal limits at this point periodically monitor TSH and clinical  status.  #7-history of chronic kidney disease creatinine of 1.33 appears to be on the higher end of her baseline Will update a metabolic panel previous creatinines have been 1.02-0.94.  #8 history of anemia most recent hemoglobin 12.4 on lab done 02/25/2016 will update this as well.  #9 history of questionable pancreatic lesion-at this time recommendation is to follow clinically-possible follow-up studies within a year if clinically indicated.  #10 hyperlipidemia she is on Pravastatin-. Secondary to advanced age and comorbidities these I suspect we will be conservative here-will update liver function tests-since she is on a statin.   [id number] note greater than 35 minutes spent assessing patient-reviewing her chart-reviewing her labs-and coordinating and formulating a plan of care for numerous diagnoses-of note greater than 50% of time spent coordinating plan of care

## 2016-04-14 LAB — CBC AND DIFFERENTIAL
HCT: 41 % (ref 36–46)
Hemoglobin: 12.7 g/dL (ref 12.0–16.0)
Platelets: 244 10*3/uL (ref 150–399)
WBC: 8.2 10^3/mL

## 2016-04-14 LAB — HEPATIC FUNCTION PANEL
ALT: 12 U/L (ref 7–35)
AST: 23 U/L (ref 13–35)
Alkaline Phosphatase: 56 U/L (ref 25–125)
Bilirubin, Total: 0.3 mg/dL

## 2016-04-14 LAB — BASIC METABOLIC PANEL
BUN: 14 mg/dL (ref 4–21)
Creatinine: 0.9 mg/dL (ref 0.5–1.1)
Glucose: 106 mg/dL
Potassium: 3.8 mmol/L (ref 3.4–5.3)
Sodium: 146 mmol/L (ref 137–147)

## 2016-04-19 DIAGNOSIS — Z961 Presence of intraocular lens: Secondary | ICD-10-CM | POA: Diagnosis not present

## 2016-04-19 DIAGNOSIS — H26492 Other secondary cataract, left eye: Secondary | ICD-10-CM | POA: Diagnosis not present

## 2016-04-20 DIAGNOSIS — F0390 Unspecified dementia without behavioral disturbance: Secondary | ICD-10-CM | POA: Diagnosis not present

## 2016-04-20 DIAGNOSIS — G4700 Insomnia, unspecified: Secondary | ICD-10-CM | POA: Diagnosis not present

## 2016-04-20 DIAGNOSIS — F29 Unspecified psychosis not due to a substance or known physiological condition: Secondary | ICD-10-CM | POA: Diagnosis not present

## 2016-05-07 DIAGNOSIS — R1310 Dysphagia, unspecified: Secondary | ICD-10-CM | POA: Diagnosis not present

## 2016-05-07 DIAGNOSIS — G9349 Other encephalopathy: Secondary | ICD-10-CM | POA: Diagnosis not present

## 2016-05-08 DIAGNOSIS — R1310 Dysphagia, unspecified: Secondary | ICD-10-CM | POA: Diagnosis not present

## 2016-05-08 DIAGNOSIS — G9349 Other encephalopathy: Secondary | ICD-10-CM | POA: Diagnosis not present

## 2016-05-09 DIAGNOSIS — G9349 Other encephalopathy: Secondary | ICD-10-CM | POA: Diagnosis not present

## 2016-05-09 DIAGNOSIS — R1310 Dysphagia, unspecified: Secondary | ICD-10-CM | POA: Diagnosis not present

## 2016-05-10 DIAGNOSIS — R1310 Dysphagia, unspecified: Secondary | ICD-10-CM | POA: Diagnosis not present

## 2016-05-10 DIAGNOSIS — G9349 Other encephalopathy: Secondary | ICD-10-CM | POA: Diagnosis not present

## 2016-05-11 DIAGNOSIS — G9349 Other encephalopathy: Secondary | ICD-10-CM | POA: Diagnosis not present

## 2016-05-11 DIAGNOSIS — R1310 Dysphagia, unspecified: Secondary | ICD-10-CM | POA: Diagnosis not present

## 2016-05-14 DIAGNOSIS — R1310 Dysphagia, unspecified: Secondary | ICD-10-CM | POA: Diagnosis not present

## 2016-05-14 DIAGNOSIS — G9349 Other encephalopathy: Secondary | ICD-10-CM | POA: Diagnosis not present

## 2016-05-15 DIAGNOSIS — G9349 Other encephalopathy: Secondary | ICD-10-CM | POA: Diagnosis not present

## 2016-05-15 DIAGNOSIS — R1310 Dysphagia, unspecified: Secondary | ICD-10-CM | POA: Diagnosis not present

## 2016-05-16 DIAGNOSIS — G9349 Other encephalopathy: Secondary | ICD-10-CM | POA: Diagnosis not present

## 2016-05-16 DIAGNOSIS — R1310 Dysphagia, unspecified: Secondary | ICD-10-CM | POA: Diagnosis not present

## 2016-05-17 DIAGNOSIS — G9349 Other encephalopathy: Secondary | ICD-10-CM | POA: Diagnosis not present

## 2016-05-17 DIAGNOSIS — R1310 Dysphagia, unspecified: Secondary | ICD-10-CM | POA: Diagnosis not present

## 2016-05-18 DIAGNOSIS — R1310 Dysphagia, unspecified: Secondary | ICD-10-CM | POA: Diagnosis not present

## 2016-05-18 DIAGNOSIS — G9349 Other encephalopathy: Secondary | ICD-10-CM | POA: Diagnosis not present

## 2016-05-20 DIAGNOSIS — Z23 Encounter for immunization: Secondary | ICD-10-CM | POA: Diagnosis not present

## 2016-05-21 DIAGNOSIS — G9349 Other encephalopathy: Secondary | ICD-10-CM | POA: Diagnosis not present

## 2016-05-21 DIAGNOSIS — R1310 Dysphagia, unspecified: Secondary | ICD-10-CM | POA: Diagnosis not present

## 2016-05-22 ENCOUNTER — Non-Acute Institutional Stay (SKILLED_NURSING_FACILITY): Payer: Medicare Other | Admitting: Internal Medicine

## 2016-05-22 DIAGNOSIS — R1310 Dysphagia, unspecified: Secondary | ICD-10-CM | POA: Diagnosis not present

## 2016-05-22 DIAGNOSIS — I5032 Chronic diastolic (congestive) heart failure: Secondary | ICD-10-CM | POA: Diagnosis not present

## 2016-05-22 DIAGNOSIS — I2699 Other pulmonary embolism without acute cor pulmonale: Secondary | ICD-10-CM

## 2016-05-22 DIAGNOSIS — G309 Alzheimer's disease, unspecified: Secondary | ICD-10-CM

## 2016-05-22 DIAGNOSIS — I10 Essential (primary) hypertension: Secondary | ICD-10-CM

## 2016-05-22 DIAGNOSIS — F0280 Dementia in other diseases classified elsewhere without behavioral disturbance: Secondary | ICD-10-CM | POA: Diagnosis not present

## 2016-05-22 DIAGNOSIS — D649 Anemia, unspecified: Secondary | ICD-10-CM

## 2016-05-22 DIAGNOSIS — G9349 Other encephalopathy: Secondary | ICD-10-CM | POA: Diagnosis not present

## 2016-05-22 NOTE — Progress Notes (Signed)
This is a routine visit.  Level care skilled.  [facility]  Chief complaint --medical management of chronic medical conditions including history of dementia-hypertension-recent bilateral pulmonary embolisms-diastolic CHF-COPD-chronic kidney disease-anemia.  History of present illness.  Patient is a very pleasant 82 year old female seen today for medical management of chronic medical conditions as noted above.  Nursing staff does not report any recent issues she appears to be doing well with supportive care ambulates about the facility in her walker continues to be pleasant appropriate and in good spirits although somewhat confused baseline with her dementia.  She continues to slowly gain weight I suspect this is secondary to good by mouth intake.  She has some mild lower extremity edema but she is not complaining of any shortness of breath chest pain ambulates apparently without difficulty in regards to her respiratory issues--she does have a history of diastolic CHF.  She continues on a beta blocker.  She was at one point hospitalized because of falls at home and increased weakness and right facial droop there was concern for CVA however workup was negative she was found to have bilateral pulmonary emboli-she is on   Eliquist.  Regards to hypertension she is on Toprol-XL 50 mg a day this appears stable I got a manual reading of 130/68 today and both arms apparently at times she does have some systolic spikes a see listed systolic in the 140s but I suspect this is not consistent per my manual reading today.  She has a history of COPD this is stable again as noted above no complaints of cough or increased shortness of breath and relates about in her walker and doing quite well.  In the hospital she was found thyroid nodule TSH was within normal limits.  She also has a history of a pancreatic lesion recommendation was to follow this clinically possible follow-up  study in one year-however she is essentially under comfort care measures with no aggressive interventions He does not appear to be symptomatic appears to be doing quite well.  Past Medical History:  Diagnosis Date  . COPD (chronic obstructive pulmonary disease) (HCC)   . Dementia   . Depression   . Dysphagia   . Hypertension   . Scoliosis         Past Surgical History:  Procedure Laterality Date  . ABDOMINAL HYSTERECTOMY    . KNEE SURGERY    . SHOULDER SURGERY    . VITRECTOMY AND CATARACT      No Known Allergies        Medication List                    acetaminophen 325 MG tablet Commonly known as:  TYLENOL Take 2 tablets (650 mg total) by mouth every 6 (six) hours as needed for mild pain, moderate pain, fever or headache.  apixaban 5 MG Tabs tablet Commonly known as:  ELIQUIS Take 1 tablet (5 mg total) by mouth 2 (two) times daily.  donepezil 10 MG tablet Commonly known as:  ARICEPT Take 1 tablet (10 mg total) by mouth at bedtime.  lidocaine 5 % Commonly known as:  LIDODERM Place 1 patch onto the skin daily. Remove & Discard patch within 12 hours or as directed by MD  metoprolol succinate 50 MG 24 hr tablet Commonly known as:  TOPROL-XL Take 1 tablet (50 mg total) by mouth daily. Take with or immediately following a meal.  MILK OF MAGNESIA PO Give 30 ml by mouth  every 24 hours as needed for bowel management  pravastatin 20 MG tablet Commonly known as:  PRAVACHOL Take 1 tablet (20 mg total) by mouth daily at 6 PM.  risperiDONE 0.5 MG tablet Commonly known as:  RISPERDAL Take 0.5 mg by mouth at bedtime.  traZODone 50 MG tablet Commonly known as:  DESYREL Take 1 tablet (50 mg total) by mouth at bedtime.      Review of Systems   As stated in history of present illness again cannot really obtain review of systems secondary to severe dementia-Nursing does not report any issues 1-she denies any shortness of breath or cough chest  pain-complains of somewhat chronic back pain diagnosed she has a Lidoderm patch applied she says this does give her relief      Immunization History  Administered Date(s) Administered  . PPD Test 02/24/2016       Pertinent  Health Maintenance Due  Topic Date Due  . DEXA SCAN  01/14/1993  . PNA vac Low Risk Adult (1 of 2 - PCV13) 01/14/1993  . INFLUENZA VACCINE  07/13/2016 (Originally 03/13/2016)   No flowsheet data found. Functional Status Survey:    Temperature 98.7 pulse 70 respirations 16 blood pressure taken manually 130/68 weight is 118 this appears again of about 5 pounds over the past several weeks  Physical Exam Constitutional: No distress. Frail elderly female   Eyes: Conjunctivae are normal.  Neck: Neck supple. No JVD present. No thyromegaly present.  Cardiovascular: Normal rate, regular rhythm and intact distal pulses. trace  lower extremity edema  Respiratory: Effort normal and breath sounds normal. No respiratory distress.   GI: Soft. Bowel sounds are normal. She exhibits no distension. There is no tenderness.  Musculoskeletal: She exhibits no edema.  Able to move all extremities  with general frailty -ambulates with a walker.  She does have a Lidoderm patch applied to her lower back-could not really elicit any sense of acute tenderness to palpation of her back   Neurological: She is alert. Extremities 4 no lateralizing findings cranial nerves appear to be grossly intact  Skin: Skin is warm and dry. She is not diaphoretic.  Psychiatric: She has a normal mood and affect. Oriented to self  Labs reviewed:   Recent Labs (within last 365 days)   Recent Labs  02/19/16 0316  02/20/16 0515 02/22/16 0648 02/25/16  NA 140  < > 142 140 142  K 3.0*  < > 4.0 4.1 3.9  CL 106  --  110 110  --   CO2 25  --  24 22  --   GLUCOSE 112*  --  100* 85  --   BUN 10  < > 8 11 18   CREATININE 1.06*  < > 0.94 1.02* 1.3*  CALCIUM 9.5  --  9.3 9.1  --   MG  --   --  1.6*  2.1  --   < > = values in this interval not displayed.    Recent Labs (within last 365 days)   Recent Labs  02/17/16 1746  AST 19  ALT 7*  ALKPHOS 46  BILITOT 0.3  PROT 6.4*  ALBUMIN 3.5      Recent Labs (within last 365 days)   Recent Labs  02/17/16 1746  02/20/16 0515 02/21/16 02/21/16 0632 02/25/16  WBC 7.0  < > 9.2 8.9 8.9 8.6  NEUTROABS 4.8  --   --   --   --   --   HGB 11.9*  < >  13.2  --  14.2 12.4  HCT 36.1  < > 39.9  --  42.8 40  MCV 88.5  --  87.9  --  87.5  --   PLT 157  < > 172  --  215 208  < > = values in this interval not displayed.   Recent Labs       Lab Results  Component Value Date   TSH 1.211 02/18/2016     Recent Labs       Lab Results  Component Value Date   HGBA1C 5.6 02/18/2016     Recent Labs       Lab Results  Component Value Date   CHOL 214 (H) 02/18/2016   HDL 54 02/18/2016   LDLCALC 142 (H) 02/18/2016   TRIG 90 02/18/2016   CHOLHDL 4.0 02/18/2016      Significant Diagnostic Results in last 30 days:  Imaging Results  No results found.    Assessment/Plan  #1 history of bilateral pulmonary embolisms this appears stable on Eliquist 5 mg by mouth twice a day.-Continues to deny any chest pain or shortness of breath  #2 dementia this appears stable she is on Aricept weight appears to be slowly increasing she is doing well with supportive care. She continues on trazodone as needed for insomnia-as well as Risperdal daily at bedtime secondary to history of psychosis which has been stable   #3 hypertension this appears stable on Toprol-XL somewhat variable systolics as noted above but no consistent elevations above 140 manual blood pressure today was 130/68 .  #4 history of diastolic CHF-continues on a beta blocker as noted above this appears to be stable has minimal lower extremity edema no complaints of chest congestion or shortness of breath at this point will monitor .  #5 COPD this appears stable  no evidence of increased cough or significant chest congestion.  #6 history of thyroid nodule-TSH has been within normal limits at this point will monitor she is under comfort care with no aggressive interventions desired  #7-history of chronic kidney disease creatinine of  0.9 to appears to be on the lower end of her baseline this appears improved from previous creatinine of 1.33  #8 history of anemia most recent hemoglobin 12.7 appears to be stable again will be conservative with updated lab secondary to desires for comfort care--   #9 history of questionable pancreatic lesion-at this time recommendation is to follow clinically-possible follow-up studies within a year if clinically indicated.-she appears to be asymptomatic again no aggressive interventions desired  #10 hyperlipidemia she is on Pravastatin-. Secondary to advanced age and comorbidities I suspect we will be conservative here--we'll discontinue statin.  [id number]

## 2016-05-23 DIAGNOSIS — R1310 Dysphagia, unspecified: Secondary | ICD-10-CM | POA: Diagnosis not present

## 2016-05-23 DIAGNOSIS — G9349 Other encephalopathy: Secondary | ICD-10-CM | POA: Diagnosis not present

## 2016-05-24 DIAGNOSIS — R1310 Dysphagia, unspecified: Secondary | ICD-10-CM | POA: Diagnosis not present

## 2016-05-24 DIAGNOSIS — G9349 Other encephalopathy: Secondary | ICD-10-CM | POA: Diagnosis not present

## 2016-05-25 DIAGNOSIS — G9349 Other encephalopathy: Secondary | ICD-10-CM | POA: Diagnosis not present

## 2016-05-25 DIAGNOSIS — R1310 Dysphagia, unspecified: Secondary | ICD-10-CM | POA: Diagnosis not present

## 2016-06-04 DIAGNOSIS — R1310 Dysphagia, unspecified: Secondary | ICD-10-CM | POA: Diagnosis not present

## 2016-06-04 DIAGNOSIS — G9349 Other encephalopathy: Secondary | ICD-10-CM | POA: Diagnosis not present

## 2016-06-05 DIAGNOSIS — R1310 Dysphagia, unspecified: Secondary | ICD-10-CM | POA: Diagnosis not present

## 2016-06-05 DIAGNOSIS — G9349 Other encephalopathy: Secondary | ICD-10-CM | POA: Diagnosis not present

## 2016-06-06 DIAGNOSIS — G9349 Other encephalopathy: Secondary | ICD-10-CM | POA: Diagnosis not present

## 2016-06-06 DIAGNOSIS — R1310 Dysphagia, unspecified: Secondary | ICD-10-CM | POA: Diagnosis not present

## 2016-06-07 DIAGNOSIS — R1310 Dysphagia, unspecified: Secondary | ICD-10-CM | POA: Diagnosis not present

## 2016-06-07 DIAGNOSIS — G9349 Other encephalopathy: Secondary | ICD-10-CM | POA: Diagnosis not present

## 2016-06-09 DIAGNOSIS — R1310 Dysphagia, unspecified: Secondary | ICD-10-CM | POA: Diagnosis not present

## 2016-06-09 DIAGNOSIS — G9349 Other encephalopathy: Secondary | ICD-10-CM | POA: Diagnosis not present

## 2016-06-11 DIAGNOSIS — G9349 Other encephalopathy: Secondary | ICD-10-CM | POA: Diagnosis not present

## 2016-06-11 DIAGNOSIS — R1310 Dysphagia, unspecified: Secondary | ICD-10-CM | POA: Diagnosis not present

## 2016-06-12 DIAGNOSIS — R1310 Dysphagia, unspecified: Secondary | ICD-10-CM | POA: Diagnosis not present

## 2016-06-12 DIAGNOSIS — G9349 Other encephalopathy: Secondary | ICD-10-CM | POA: Diagnosis not present

## 2016-06-13 DIAGNOSIS — R1310 Dysphagia, unspecified: Secondary | ICD-10-CM | POA: Diagnosis not present

## 2016-06-13 DIAGNOSIS — G9349 Other encephalopathy: Secondary | ICD-10-CM | POA: Diagnosis not present

## 2016-06-14 DIAGNOSIS — G9349 Other encephalopathy: Secondary | ICD-10-CM | POA: Diagnosis not present

## 2016-06-14 DIAGNOSIS — R1310 Dysphagia, unspecified: Secondary | ICD-10-CM | POA: Diagnosis not present

## 2016-06-15 DIAGNOSIS — G9349 Other encephalopathy: Secondary | ICD-10-CM | POA: Diagnosis not present

## 2016-06-15 DIAGNOSIS — R1310 Dysphagia, unspecified: Secondary | ICD-10-CM | POA: Diagnosis not present

## 2016-06-18 DIAGNOSIS — G9349 Other encephalopathy: Secondary | ICD-10-CM | POA: Diagnosis not present

## 2016-06-18 DIAGNOSIS — R1310 Dysphagia, unspecified: Secondary | ICD-10-CM | POA: Diagnosis not present

## 2016-06-19 DIAGNOSIS — G9349 Other encephalopathy: Secondary | ICD-10-CM | POA: Diagnosis not present

## 2016-06-19 DIAGNOSIS — R1310 Dysphagia, unspecified: Secondary | ICD-10-CM | POA: Diagnosis not present

## 2016-06-20 ENCOUNTER — Non-Acute Institutional Stay (SKILLED_NURSING_FACILITY): Payer: Medicare Other | Admitting: Adult Health

## 2016-06-20 ENCOUNTER — Encounter: Payer: Self-pay | Admitting: Adult Health

## 2016-06-20 DIAGNOSIS — D649 Anemia, unspecified: Secondary | ICD-10-CM

## 2016-06-20 DIAGNOSIS — N183 Chronic kidney disease, stage 3 unspecified: Secondary | ICD-10-CM

## 2016-06-20 DIAGNOSIS — R1310 Dysphagia, unspecified: Secondary | ICD-10-CM | POA: Diagnosis not present

## 2016-06-20 DIAGNOSIS — J438 Other emphysema: Secondary | ICD-10-CM

## 2016-06-20 DIAGNOSIS — E782 Mixed hyperlipidemia: Secondary | ICD-10-CM | POA: Diagnosis not present

## 2016-06-20 DIAGNOSIS — E041 Nontoxic single thyroid nodule: Secondary | ICD-10-CM

## 2016-06-20 DIAGNOSIS — I5032 Chronic diastolic (congestive) heart failure: Secondary | ICD-10-CM | POA: Diagnosis not present

## 2016-06-20 DIAGNOSIS — G9349 Other encephalopathy: Secondary | ICD-10-CM | POA: Diagnosis not present

## 2016-06-20 DIAGNOSIS — I2699 Other pulmonary embolism without acute cor pulmonale: Secondary | ICD-10-CM | POA: Diagnosis not present

## 2016-06-20 NOTE — Progress Notes (Signed)
Patient ID: Roberta Griffin, female   DOB: June 19, 1926, 82 y.o.   MRN: [id number]   Location:   [facility] Room Number: 228-A Place of Service:  SNF (31)   CODE STATUS: DNR  No Known Allergies  Chief Complaint  Patient presents with  . Medical Management of Chronic Issues    Follow up    HPI:  She is a long term resident of this facility being seen for the management of her chronic illnesses. She continues to do well. She is able to ambulate. She tells me that she feels good.  There are no nursing concerns at this time.    Past Medical History:  Diagnosis Date  . COPD (chronic obstructive pulmonary disease) (HCC)   . Dementia   . Depression   . Dysphagia   . Hypertension   . Scoliosis     Past Surgical History:  Procedure Laterality Date  . ABDOMINAL HYSTERECTOMY    . KNEE SURGERY    . SHOULDER SURGERY    . VITRECTOMY AND CATARACT      Social History   Social History  . Marital status: Single    Spouse name: N/A  . Number of children: N/A  . Years of education: N/A   Occupational History  . Not on file.   Social History Main Topics  . Smoking status: Never Smoker  . Smokeless tobacco: Never Used  . Alcohol use No  . Drug use: No  . Sexual activity: Not on file   Other Topics Concern  . Not on file   Social History Narrative  . No narrative on file   Family History  Problem Relation Age of Onset  . Stroke Neg Hx       VITAL SIGNS BP (!) 160/74   Pulse 81   Temp 98.1 F (36.7 C) (Oral)   Resp 18   Ht 5\' 3"  (1.6 m)   Wt 118 lb (53.5 kg)   SpO2 99%   BMI 20.90 kg/m   Patient's Medications  New Prescriptions   No medications on file  Previous Medications   ACETAMINOPHEN (TYLENOL) 325 MG TABLET    Take 2 tablets (650 mg total) by mouth every 6 (six) hours as needed for mild pain, moderate pain, fever or headache.   APIXABAN (ELIQUIS) 5 MG TABS TABLET    Take 1 tablet (5 mg total) by mouth 2 (two) times daily.   BUSPIRONE  (BUSPAR) 7.5 MG TABLET    Take 7.5 mg by mouth 2 (two) times daily.   DONEPEZIL (ARICEPT) 10 MG TABLET    Take 1 tablet (10 mg total) by mouth at bedtime.   LIDOCAINE (LIDODERM) 5 %    Place 1 patch onto the skin daily. Remove & Discard patch within 12 hours or as directed by MD   MAGNESIUM HYDROXIDE (MILK OF MAGNESIA PO)    Give 30 ml by mouth every 24 hours as needed for bowel management   METOPROLOL SUCCINATE (TOPROL-XL) 50 MG 24 HR TABLET    Take 1 tablet (50 mg total) by mouth daily. Take with or immediately following a meal.   RISPERIDONE (RISPERDAL) 0.5 MG TABLET    Take 0.5 mg by mouth at bedtime.   TRAZODONE (DESYREL) 50 MG TABLET    Take 1 tablet (50 mg total) by mouth at bedtime.  Modified Medications   No medications on file  Discontinued Medications   PRAVASTATIN (PRAVACHOL) 20 MG TABLET    Take 1 tablet (20 mg  total) by mouth daily at 6 PM.     SIGNIFICANT DIAGNOSTIC EXAMS  02-17-16: ct of head: 1. No acute intracranial findings. 2. Marked atrophy and moderate white matter microvascular disease.   02-18-16: mri/mra of head: 1.  No acute intracranial abnormality. 2. Chronic posterior right MCA infarct with superimposed bilateral white matter changes most suggestive of chronic small vessel disease. 3.  Negative intracranial MRA aside from mild arterial tortuosity. 4. Focal hyperostosis of the right frontal bone versus small right frontal meningioma, appears inconsequential.  02-18-16: carotid ultrasound: Bilateral: intimal wall thickening CCA. Mild soft plaque origin ICA. 1-39% ICA plaquing. Vertebral artery flow is antegrade.  02-19-16: ct of chest: Filling defects in multiple bilateral segmental and subsegmental pulmonary arteries consistent with pulmonary embolus. Clot burden slow. Diffuse cardiac enlargement likely represents pre-existing heart failure. Small pericardial effusion. Left thyroid gland nodule. Dilated pancreatic duct with possible low-attenuation lesion in the  pancreatic tail.   02-19-16: 2-d echo: Left ventricle: The cavity size was normal. Systolic function was normal. The estimated ejection fraction was in the range of 55% to 60%. Wall motion was normal; there were no regional wall motion abnormalities. Doppler parameters are consistent with abnormal left ventricular relaxation (grade 1 diastolic dysfunction). - Tricuspid valve: There was moderate regurgitation directed centrally. - Pulmonary arteries: PA peak pressure: 33 mm Hg (S). - Pericardium, extracardiac: A small to moderate, free-flowing pericardial effusion was identified circumferential to the heart. There was no evidence of hemodynamic compromise.  02-20-16: ct of abdomen and pelvis: 1. 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 Mild main pancreatic duct dilation. A follow-up pancreas protocol MRI (preferred if the patient can breath-hold) or CT abdomen without and with IV contrast is recommended in 12 months. This recommendation follows ACR consensus guidelines: Managing Incidental Findings on Abdominal CT: White Paper of the ACR Incidental Findings Committee. J Am Coll Radiol 2010;7:754-773. 2. Small moderate pericardial effusion/thickening. 3. Cholelithiasis.  No biliary ductal dilatation. 4. Moderate sigmoid diverticulosis. 5. Aortic atherosclerosis. 6. Myomatous uterus.   02-21-16: pelvic and bilateral hip x-ray: No evidence of bilateral hip fracture. Moderate osteoarthritic changes of bilateral hips.    LABS REVIEWED:   02-17-16: wbc 7.0; hgb 11.9; hct 36.1; mcv 88.5; plt 157; glucose 93; bun 14; creat 1.19; k+ 3.5; na++ 138; liver normal albumin 3.5; urine culture: multiple species 02-18-16; hgb a1c 5.6; chol 214; ldl 142; trig 90; hdl 54; vit B 12: 353; ammonia 22; tsh 1.211; RPR: nr; HIV: nr 02-20-16; wbc 9.2; hgb  13.2; hct 39.9; mcv 87.9; plt 172; glucose 100; bun 8; creat 0.94; k+ 4.0; na++ 140; mag 1.6  04-14-16; wbc 8.2; hgb 12.7; hct 41.2; mcv 88.2; plt 244; glucose 106; bun 13.1; create 0.92; k+ 3.8; na++ 146; liver normal albumin 3.8 mag 2.1     Review of Systems  Constitutional: Negative for malaise/fatigue.  Respiratory: Negative for cough and shortness of breath.   Cardiovascular: Negative for chest pain, palpitations and leg swelling.  Gastrointestinal: Negative for abdominal pain, constipation and heartburn.  Musculoskeletal: Negative for back pain, joint pain and myalgias.  Skin: Negative.   Neurological: Negative for dizziness.  Psychiatric/Behavioral: The patient is not nervous/anxious.      Physical Exam  Constitutional: No distress.  Frail   Eyes: Conjunctivae are normal.  Neck: Neck supple. No JVD present. No thyromegaly present.  Cardiovascular: Normal rate, regular rhythm and intact  distal pulses.   Respiratory: Effort normal and breath sounds normal. No respiratory distress. She has no wheezes.  GI: Soft. Bowel sounds are normal. She exhibits no distension. There is no tenderness.  Musculoskeletal: She exhibits no edema.  Able to move all extremities   Lymphadenopathy:    She has no cervical adenopathy.  Neurological: She is alert.  Skin: Skin is warm and dry. She is not diaphoretic.  Psychiatric: She has a normal mood and affect.      ASSESSMENT/ PLAN:  1. Bilateral pulmonary emboli: is stable at this time; will continue eliquis  5 mg twice daily will monitor  2. Vascular dementia; her current weight is 107 pounds; will continue aricept 10 mg nightly  3. Dyslipidemia:  daily ldl 142 pravachol has been stopped   4. Hypertension: will continue toprol xl 50 mg daily  5. Diastolic heart failure: does have a pericardial effusion will continue toprol xl 50 mg daily  6. COPD: is presently without change will continue to monitor  7. Thyroid nodule: tsh is 1.211  8.  Dementia: will continue aricept 10 mg daily weight is 118 pounds  9. CKD stage III: bun/creat  13.1/0.92  10. Anemia: hgb is 12.7  11.  Psychosis: will continue risperdal 0.5 mg nightly will continue trazodone 50 mg nightly      Synthia Innocenteborah Green NP [facility]  Contact [phone] Monday through Friday 8am- 5pm  After hours call [phone]

## 2016-06-21 DIAGNOSIS — R1310 Dysphagia, unspecified: Secondary | ICD-10-CM | POA: Diagnosis not present

## 2016-06-21 DIAGNOSIS — G9349 Other encephalopathy: Secondary | ICD-10-CM | POA: Diagnosis not present

## 2016-06-22 DIAGNOSIS — R1310 Dysphagia, unspecified: Secondary | ICD-10-CM | POA: Diagnosis not present

## 2016-06-22 DIAGNOSIS — G9349 Other encephalopathy: Secondary | ICD-10-CM | POA: Diagnosis not present

## 2016-06-25 DIAGNOSIS — R1310 Dysphagia, unspecified: Secondary | ICD-10-CM | POA: Diagnosis not present

## 2016-06-25 DIAGNOSIS — G9349 Other encephalopathy: Secondary | ICD-10-CM | POA: Diagnosis not present

## 2016-06-27 DIAGNOSIS — G9349 Other encephalopathy: Secondary | ICD-10-CM | POA: Diagnosis not present

## 2016-06-27 DIAGNOSIS — R1310 Dysphagia, unspecified: Secondary | ICD-10-CM | POA: Diagnosis not present

## 2016-06-28 DIAGNOSIS — R1310 Dysphagia, unspecified: Secondary | ICD-10-CM | POA: Diagnosis not present

## 2016-06-28 DIAGNOSIS — G9349 Other encephalopathy: Secondary | ICD-10-CM | POA: Diagnosis not present

## 2016-06-30 DIAGNOSIS — G9349 Other encephalopathy: Secondary | ICD-10-CM | POA: Diagnosis not present

## 2016-06-30 DIAGNOSIS — R1310 Dysphagia, unspecified: Secondary | ICD-10-CM | POA: Diagnosis not present

## 2016-07-24 ENCOUNTER — Non-Acute Institutional Stay (SKILLED_NURSING_FACILITY): Payer: Medicare Other | Admitting: Internal Medicine

## 2016-07-24 DIAGNOSIS — G309 Alzheimer's disease, unspecified: Secondary | ICD-10-CM

## 2016-07-24 DIAGNOSIS — I2699 Other pulmonary embolism without acute cor pulmonale: Secondary | ICD-10-CM

## 2016-07-24 DIAGNOSIS — N183 Chronic kidney disease, stage 3 unspecified: Secondary | ICD-10-CM

## 2016-07-24 DIAGNOSIS — I5032 Chronic diastolic (congestive) heart failure: Secondary | ICD-10-CM | POA: Diagnosis not present

## 2016-07-24 DIAGNOSIS — D649 Anemia, unspecified: Secondary | ICD-10-CM

## 2016-07-24 DIAGNOSIS — F0280 Dementia in other diseases classified elsewhere without behavioral disturbance: Secondary | ICD-10-CM | POA: Diagnosis not present

## 2016-07-24 DIAGNOSIS — J438 Other emphysema: Secondary | ICD-10-CM | POA: Diagnosis not present

## 2016-07-24 NOTE — Progress Notes (Signed)
This is a routine visit.  Level care skilled.  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  She is currently resting in her bed comfortably she is ambulatory.  She has no complaints today continues to be pleasant and cooperative with exam.   She does have a history of hypertension she is on Toprol-XL 50 mg a day I got a manual reading of 130/70 today.  She does have a history psychosis but this appears to be quite stable she is on risperdal at night and trazodone as well.  Regards to dementia again she is doing well with supportive care she is on Aricept.  In regards chronic kidney disease this appears relatively stable back in September her creatinine was 0.92 BUN of 13 we will update this.  She also has a history of anemia most recent hemoglobin was 12.7 will update this as well as  Past Medical History:  Diagnosis Date  . COPD (chronic obstructive pulmonary disease) (HCC)   . Dementia   . Depression   . Dysphagia   . Hypertension   . Scoliosis          Past Surgical History:  Procedure Laterality Date  . ABDOMINAL HYSTERECTOMY    . KNEE SURGERY    . SHOULDER SURGERY    . VITRECTOMY AND CATARACT      Social History        Social History  . Marital status: Single    Spouse name: N/A  . Number of children: N/A  . Years of education: N/A      Occupational History  . Not on file.       Social History Main Topics  . Smoking status: Never Smoker  . Smokeless tobacco: Never Used  . Alcohol use  No  . Drug use: No  . Sexual activity: Not on file       Other Topics Concern  . Not on file      Social History Narrative  . No narrative on file        Family History  Problem Relation Age of Onset  . Stroke Neg Hx       VITAL SIGNS  Temperature 98.4 pulse 80 respirations 18 blood pressure manually 130/70 weight is 126 pounds     Patient's Medications  New Prescriptions   No medications on file  Previous Medications   ACETAMINOPHEN (TYLENOL) 325 MG TABLET    Take 2 tablets (650 mg total) by mouth every 6 (six) hours as needed for mild pain, moderate pain, fever or headache.   APIXABAN (ELIQUIS) 5 MG TABS TABLET    Take 1 tablet (5 mg total) by mouth 2 (two) times daily.   BUSPIRONE (BUSPAR) 7.5 MG TABLET    Take 7.5 mg by mouth 2 (two) times daily.   DONEPEZIL (ARICEPT) 10 MG TABLET    Take 1 tablet (10 mg total) by mouth at bedtime.   LIDOCAINE (LIDODERM) 5 %    Place 1 patch onto the skin daily. Remove & Discard patch within 12 hours or as directed by MD   MAGNESIUM HYDROXIDE (MILK OF MAGNESIA PO)    Give 30  ml by mouth every 24 hours as needed for bowel management   METOPROLOL SUCCINATE (TOPROL-XL) 50 MG 24 HR TABLET    Take 1 tablet (50 mg total) by mouth daily. Take with or immediately following a meal.   RISPERIDONE (RISPERDAL) 0.5 MG TABLET    Take 0.5 mg by mouth at bedtime.   TRAZODONE (DESYREL) 50 MG TABLET    Take 1 tablet (50 mg total) by mouth at bedtime.  Modified Medications   No medications on file  Discontinued Medications   PRAVASTATIN (PRAVACHOL) 20 MG TABLET    Take 1 tablet (20 mg total) by mouth daily at 6 PM.     SIGNIFICANT DIAGNOSTIC EXAMS  02-17-16: ct of head: 1. No acute intracranial findings. 2. Marked atrophy and moderate white matter microvascular disease.  02-18-16: mri/mra of head: 1. No acute intracranial abnormality. 2. Chronic posterior right MCA infarct with superimposed bilateral white matter changes most  suggestive of chronic small vessel disease. 3. Negative intracranial MRA aside from mild arterial tortuosity. 4. Focal hyperostosis of the right frontal bone versus small right frontal meningioma, appears inconsequential.  02-18-16: carotid ultrasound: Bilateral: intimal wall thickening CCA. Mild soft plaque origin ICA. 1-39% ICA plaquing. Vertebral artery flow is antegrade.  02-19-16: ct of chest: Filling defects in multiple bilateral segmental and subsegmental pulmonary arteries consistent with pulmonary embolus. Clot burden slow. Diffuse cardiac enlargement likely represents pre-existing heart failure. Small pericardial effusion. Left thyroid gland nodule. Dilated pancreatic duct with possible low-attenuation lesion in the pancreatic tail.  02-19-16: 2-d echo: Left ventricle: The cavity size was normal. Systolic function was normal. The estimated ejection fraction was in the range of 55% to 60%. Wall motion was normal; there were no regional wall motion abnormalities. Doppler parameters are consistent with abnormal left ventricular relaxation (grade 1 diastolic dysfunction). - Tricuspid valve: There was moderate regurgitation directed centrally. - Pulmonary arteries: PA peak pressure: 33 mm Hg (S). - Pericardium, extracardiac: A small to moderate, free-flowing pericardial effusion was identified circumferential to the heart. There was no evidence of hemodynamic compromise.  02-20-16: ct of abdomen and pelvis: 1. 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 Mild main pancreatic duct dilation. A follow-up pancreas protocol MRI (preferred if the patient can breath-hold) or CT abdomen without and with IV contrast is recommended in 12 months. This recommendation follows ACR consensus guidelines: Managing Incidental Findings on Abdominal  CT: White Paper of the ACR Incidental Findings Committee. J Am Coll Radiol 2010;7:754-773. 2. Small moderate pericardial effusion/thickening. 3. Cholelithiasis. No biliary ductal dilatation. 4. Moderate sigmoid diverticulosis. 5. Aortic atherosclerosis. 6. Myomatous uterus.  02-21-16: pelvic and bilateral hip x-ray: No evidence of bilateral hip fracture. Moderate osteoarthritic changes of bilateral hips.   LABS REVIEWED:   02-17-16: wbc 7.0; hgb 11.9; hct 36.1; mcv 88.5; plt 157; glucose 93; bun 14; creat 1.19; k+ 3.5; na++ 138; liver normal albumin 3.5; urine culture: multiple species 02-18-16; hgb a1c 5.6; chol 214; ldl 142; trig 90; hdl 54; vit B 12: 353; ammonia 22; tsh 1.211; RPR: nr; HIV: nr 02-20-16; wbc 9.2; hgb 13.2; hct 39.9; mcv 87.9; plt 172; glucose 100; bun 8; creat 0.94; k+ 4.0; na++ 140; mag 1.6  04-14-16; wbc 8.2; hgb 12.7; hct 41.2; mcv 88.2; plt 244; glucose 106; bun 13.1; create 0.92; k+ 3.8; na++ 146; liver normal albumin 3.8 mag 2.1    Review of Systems Limited  secondary to dementia Constitutional: Negative for malaise/fatigue. Has had some weight gain Respiratory: Negative for cough and shortness of breath.   Cardiovascular: Negative for chest pain, palpitations and leg swelling.  Gastrointestinal: Negative for abdominal pain, constipation and heartburn.  Musculoskeletal: Negative for back pain, joint pain and myalgias.  Skin: Negative.   for rashes or itching Neurological: Negative for dizziness. or syncope Psychiatric/Behavioral: The patient is not nervous/anxious.  nursing staff does not report behavioral issues      Physical Exam  Constitutional: No distress.  Frail  --sitting on the side of her bed Eyes: Conjunctivae are normal.  Neck: Neck supple. No JVD present. No thyromegaly present.  Cardiovascular: Normal rate, regular rhythm and intact distal pulses. Trace lower extremity edema   Respiratory: Effort normal and breath sounds normal. No  respiratory distress. She has no wheezes.  GI: Soft. Bowel sounds are normal. She exhibits no distension. There is no tenderness.  Musculoskeletal: She exhibits no edema.  Able to move all extremities   is ambulatory I don't see any changes other than arthritic  Lymphadenopathy:    She has no cervical adenopathy.  Neurological: She is alert. No lateralizing findings strength appears to be intact L4 extremities Skin: Skin is warm and dry. She is not diaphoretic.  Psychiatric: She has a normal mood and affect. Pleasant and appropriate oriented to self follow simple verbal commands     ASSESSMENT/ PLAN:  1. Bilateral pulmonary emboli: is stable at this time; will continue eliquis  5 mg twice daily will monitor--no complaints of chest pain or shortness of breath  2. Vascular dementia; her current weight is 126 pounds she does not appear to have  grossly changed edema; will continue aricept 10 mg nightly--weight gain is desired  3. Dyslipidemia:  pravachol has been stopped I suspect secondary to advanced age and comorbidities-  4. Hypertension: will continue toprol xl 50 mg daily blood pressure manually 130/70 occasional  spikes above that but at this point will monitor this does not appear to be consistent  5. Diastolic heart failure: does have a pericardial effusion will continue toprol xl 50 mg daily edema appears to be minimal  6. COPD: is presently without change will continue to monitor  7. Thyroid nodule: tsh is 1.211    8. CKD stage III: bun/creat  13.1/0.92 on sept 2017 lab----will update  9. Anemia: hgb is 12.7 her September lab will update this as well  10  Psychosis: will continue risperdal 0.5 mg nightly will continue trazodone 50 mg nightly --this appears stable has been pleasant and cooperative with exams when I have seen her  #11 borderline hypernatremia of 146 on lab done in September we will update this  [id number]-of note greater than 35 minutes spent  assessing patient-discussing her status with nursing staff-reviewing her chart-her labs-and coordinating and formulating a plan of care for numerous diagnoses-of note greater than 50% of time spent coordinating plan of care

## 2016-08-20 DIAGNOSIS — R1310 Dysphagia, unspecified: Secondary | ICD-10-CM | POA: Diagnosis not present

## 2016-08-20 DIAGNOSIS — G9349 Other encephalopathy: Secondary | ICD-10-CM | POA: Diagnosis not present

## 2016-08-21 DIAGNOSIS — R1310 Dysphagia, unspecified: Secondary | ICD-10-CM | POA: Diagnosis not present

## 2016-08-21 DIAGNOSIS — G9349 Other encephalopathy: Secondary | ICD-10-CM | POA: Diagnosis not present

## 2016-08-22 DIAGNOSIS — R1310 Dysphagia, unspecified: Secondary | ICD-10-CM | POA: Diagnosis not present

## 2016-08-22 DIAGNOSIS — G9349 Other encephalopathy: Secondary | ICD-10-CM | POA: Diagnosis not present

## 2016-08-23 DIAGNOSIS — R1310 Dysphagia, unspecified: Secondary | ICD-10-CM | POA: Diagnosis not present

## 2016-08-23 DIAGNOSIS — G9349 Other encephalopathy: Secondary | ICD-10-CM | POA: Diagnosis not present

## 2016-08-24 DIAGNOSIS — R1310 Dysphagia, unspecified: Secondary | ICD-10-CM | POA: Diagnosis not present

## 2016-08-24 DIAGNOSIS — G9349 Other encephalopathy: Secondary | ICD-10-CM | POA: Diagnosis not present

## 2016-08-27 DIAGNOSIS — G9349 Other encephalopathy: Secondary | ICD-10-CM | POA: Diagnosis not present

## 2016-08-27 DIAGNOSIS — R1310 Dysphagia, unspecified: Secondary | ICD-10-CM | POA: Diagnosis not present

## 2016-08-28 DIAGNOSIS — R1310 Dysphagia, unspecified: Secondary | ICD-10-CM | POA: Diagnosis not present

## 2016-08-28 DIAGNOSIS — G9349 Other encephalopathy: Secondary | ICD-10-CM | POA: Diagnosis not present

## 2016-08-29 DIAGNOSIS — R1310 Dysphagia, unspecified: Secondary | ICD-10-CM | POA: Diagnosis not present

## 2016-08-29 DIAGNOSIS — G9349 Other encephalopathy: Secondary | ICD-10-CM | POA: Diagnosis not present

## 2016-08-30 DIAGNOSIS — R1310 Dysphagia, unspecified: Secondary | ICD-10-CM | POA: Diagnosis not present

## 2016-08-30 DIAGNOSIS — G9349 Other encephalopathy: Secondary | ICD-10-CM | POA: Diagnosis not present

## 2016-08-31 DIAGNOSIS — R1310 Dysphagia, unspecified: Secondary | ICD-10-CM | POA: Diagnosis not present

## 2016-08-31 DIAGNOSIS — G9349 Other encephalopathy: Secondary | ICD-10-CM | POA: Diagnosis not present

## 2016-09-03 DIAGNOSIS — G9349 Other encephalopathy: Secondary | ICD-10-CM | POA: Diagnosis not present

## 2016-09-03 DIAGNOSIS — R1310 Dysphagia, unspecified: Secondary | ICD-10-CM | POA: Diagnosis not present

## 2016-09-04 DIAGNOSIS — R1310 Dysphagia, unspecified: Secondary | ICD-10-CM | POA: Diagnosis not present

## 2016-09-04 DIAGNOSIS — G9349 Other encephalopathy: Secondary | ICD-10-CM | POA: Diagnosis not present

## 2016-09-05 DIAGNOSIS — R1310 Dysphagia, unspecified: Secondary | ICD-10-CM | POA: Diagnosis not present

## 2016-09-05 DIAGNOSIS — G9349 Other encephalopathy: Secondary | ICD-10-CM | POA: Diagnosis not present

## 2016-09-06 DIAGNOSIS — R1310 Dysphagia, unspecified: Secondary | ICD-10-CM | POA: Diagnosis not present

## 2016-09-06 DIAGNOSIS — G9349 Other encephalopathy: Secondary | ICD-10-CM | POA: Diagnosis not present

## 2016-09-07 DIAGNOSIS — R1310 Dysphagia, unspecified: Secondary | ICD-10-CM | POA: Diagnosis not present

## 2016-09-07 DIAGNOSIS — G9349 Other encephalopathy: Secondary | ICD-10-CM | POA: Diagnosis not present

## 2016-09-10 DIAGNOSIS — G9349 Other encephalopathy: Secondary | ICD-10-CM | POA: Diagnosis not present

## 2016-09-10 DIAGNOSIS — R1310 Dysphagia, unspecified: Secondary | ICD-10-CM | POA: Diagnosis not present

## 2016-09-11 DIAGNOSIS — G9349 Other encephalopathy: Secondary | ICD-10-CM | POA: Diagnosis not present

## 2016-09-11 DIAGNOSIS — R1310 Dysphagia, unspecified: Secondary | ICD-10-CM | POA: Diagnosis not present

## 2016-09-12 ENCOUNTER — Non-Acute Institutional Stay (SKILLED_NURSING_FACILITY): Payer: Medicare Other | Admitting: Adult Health

## 2016-09-12 DIAGNOSIS — I2699 Other pulmonary embolism without acute cor pulmonale: Secondary | ICD-10-CM

## 2016-09-12 DIAGNOSIS — I10 Essential (primary) hypertension: Secondary | ICD-10-CM

## 2016-09-12 DIAGNOSIS — N183 Chronic kidney disease, stage 3 unspecified: Secondary | ICD-10-CM

## 2016-09-12 DIAGNOSIS — E782 Mixed hyperlipidemia: Secondary | ICD-10-CM | POA: Diagnosis not present

## 2016-09-12 DIAGNOSIS — D649 Anemia, unspecified: Secondary | ICD-10-CM | POA: Diagnosis not present

## 2016-09-12 DIAGNOSIS — I5032 Chronic diastolic (congestive) heart failure: Secondary | ICD-10-CM | POA: Diagnosis not present

## 2016-09-12 DIAGNOSIS — J438 Other emphysema: Secondary | ICD-10-CM | POA: Diagnosis not present

## 2016-09-12 DIAGNOSIS — I639 Cerebral infarction, unspecified: Secondary | ICD-10-CM | POA: Diagnosis not present

## 2016-09-12 DIAGNOSIS — G9349 Other encephalopathy: Secondary | ICD-10-CM | POA: Diagnosis not present

## 2016-09-12 DIAGNOSIS — R1310 Dysphagia, unspecified: Secondary | ICD-10-CM | POA: Diagnosis not present

## 2016-09-13 DIAGNOSIS — R1310 Dysphagia, unspecified: Secondary | ICD-10-CM | POA: Diagnosis not present

## 2016-09-13 DIAGNOSIS — G9349 Other encephalopathy: Secondary | ICD-10-CM | POA: Diagnosis not present

## 2016-09-14 DIAGNOSIS — G9349 Other encephalopathy: Secondary | ICD-10-CM | POA: Diagnosis not present

## 2016-09-14 DIAGNOSIS — R1310 Dysphagia, unspecified: Secondary | ICD-10-CM | POA: Diagnosis not present

## 2016-09-17 ENCOUNTER — Other Ambulatory Visit: Payer: Self-pay | Admitting: *Deleted

## 2016-09-17 DIAGNOSIS — M25551 Pain in right hip: Secondary | ICD-10-CM | POA: Diagnosis not present

## 2016-09-17 MED ORDER — LORAZEPAM 0.5 MG PO TABS: ORAL_TABLET | ORAL | 0 refills | Status: DC

## 2016-09-17 MED ORDER — TRAMADOL HCL 50 MG PO TABS: ORAL_TABLET | ORAL | 0 refills | Status: DC

## 2016-09-17 NOTE — Telephone Encounter (Signed)
[facility] #[phone] Fax:[phone]  

## 2016-09-18 ENCOUNTER — Non-Acute Institutional Stay (SKILLED_NURSING_FACILITY): Payer: Medicare Other | Admitting: Internal Medicine

## 2016-09-18 DIAGNOSIS — R269 Unspecified abnormalities of gait and mobility: Secondary | ICD-10-CM

## 2016-09-18 DIAGNOSIS — G4700 Insomnia, unspecified: Secondary | ICD-10-CM

## 2016-09-18 NOTE — Progress Notes (Signed)
This is an acute visit.  Level care skilled.  Facility is [facility]-acute visit follow-up fall also follow-up insomnia  History of present illness.  Patient is an 83 year old female seen today for follow-up of fall-apparently earlier this week she sustained a fall and had complained apparently of some hip discomfort-x-ray was done which apparently was negative for any acute process-currently she is resting in bed comfortably her vital signs are stable she is not complaining of any acute pain-she does at time complain of various pain elements ranging from her knees to her back to her hips but none of these are consistent and apparently fairly chronic.  In regards to insomnia-there is thought that possibly this contributed to her fall with restlessness-apparently psychiatric nurse practitioner was contacted about this and did make changes yesterday including    Past Medical History:  Diagnosis Date  . COPD (chronic obstructive pulmonary disease) (HCC)   . Dementia   . Depression   . Dysphagia   . Hypertension   . Scoliosis          Past Surgical History:  Procedure Laterality Date  . ABDOMINAL HYSTERECTOMY    . KNEE SURGERY    . SHOULDER SURGERY    . VITRECTOMY AND CATARACT      Social History        Social History  . Marital status: Single    Spouse name: N/A  . Number of children: N/A  . Years of education: N/A      Occupational History  . Not on file.       Social History Main Topics  . Smoking status: Never Smoker  . Smokeless tobacco: Never Used  . Alcohol use No  . Drug use: No  . Sexual activity: Not on file       Other Topics Concern  . Not on file      Social History Narrative  . No narrative on file        Family History  Problem Relation Age of Onset  . Stroke Neg Hx       VITAL SIGNS  Temperature 98.7 pulse 72 respirations 15 blood pressure  124/66     Patient's Medications  New Prescriptions   No medications on file  Previous Medications   ACETAMINOPHEN (TYLENOL) 325 MG TABLET Take 2 tablets (650 mg total) by mouth every 6 (six) hours as needed for mild pain, moderate pain, fever or headache.   APIXABAN (ELIQUIS) 5 MG TABS TABLET Take 1 tablet (5 mg total) by mouth 2 (two) times daily.   BUSPIRONE (BUSPAR) 7.5 MG TABLET Take 7.5 mg by mouth 2 (two) times daily.   DONEPEZIL (ARICEPT) 10 MG TABLET Take 1 tablet (10 mg total) by mouth at bedtime.   LIDOCAINE (LIDODERM) 5 % Place 1 patch onto the skin daily. Remove &Discard patch within 12 hours or as directed by MD   MAGNESIUM HYDROXIDE (MILK OF MAGNESIA PO) Give 30 ml by mouth every 24 hours as needed for bowel management   METOPROLOL SUCCINATE (TOPROL-XL) 50 MG 24 HR TABLET Take 1 tablet (50 mg total) by mouth daily. Take with or immediately following a meal.   RISPERIDONE (RISPERDAL) 0.5 MG TABLET Take 0.5 mg by mouth at bedtime.   TRAZODONE (DESYREL) 50 MG TABLET Take 1 tablet (50 mg total) by mouth at bedtime.  Modified Medications   No medications on file  Discontinued Medications   PRAVASTATIN (PRAVACHOL) 20 MG TABLET Take 1  tablet (20 mg total) by mouth daily at 6 PM.     SIGNIFICANT DIAGNOSTIC EXAMS  02-17-16: ct of head: 1. No acute intracranial findings. 2. Marked atrophy and moderate white matter microvascular disease.  02-18-16: mri/mra of head: 1. No acute intracranial abnormality. 2. Chronic posterior right MCA infarct with superimposed bilateral white matter changes most suggestive of chronic small vessel disease. 3. Negative intracranial MRA aside from mild arterial tortuosity. 4. Focal hyperostosis of the right frontal bone versus small right frontal meningioma, appears inconsequential.  02-18-16: carotid ultrasound: Bilateral: intimal wall thickening CCA. Mild soft plaque origin ICA. 1-39% ICA plaquing.  Vertebral artery flow is antegrade.  02-19-16: ct of chest: Filling defects in multiple bilateral segmental and subsegmental pulmonary arteries consistent with pulmonary embolus. Clot burden slow. Diffuse cardiac enlargement likely represents pre-existing heart failure. Small pericardial effusion. Left thyroid gland nodule. Dilated pancreatic duct with possible low-attenuation lesion in the pancreatic tail.  02-19-16: 2-d echo: Left ventricle: The cavity size was normal. Systolic function was normal. The estimated ejection fraction was in the range of 55% to 60%. Wall motion was normal; there were no regional wall motion abnormalities. Doppler parameters are consistent with abnormal left ventricular relaxation (grade 1 diastolic dysfunction). - Tricuspid valve: There was moderate regurgitation directed centrally. - Pulmonary arteries: PA peak pressure: 33 mm Hg (S). - Pericardium, extracardiac: A small to moderate, free-flowing pericardial effusion was identified circumferential to the heart. There was no evidence of hemodynamic compromise.  02-20-16: ct of abdomen and pelvis: 1. 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 Mild main pancreatic duct dilation. A follow-up pancreas protocol MRI (preferred if the patient can breath-hold) or CT abdomen without and with IV contrast is recommended in 12 months. This recommendation follows ACR consensus guidelines: Managing Incidental Findings on Abdominal CT: White Paper of the ACR Incidental Findings Committee. J Am Coll Radiol 2010;7:754-773. 2. Small moderate pericardial effusion/thickening. 3. Cholelithiasis. No biliary ductal dilatation. 4. Moderate sigmoid diverticulosis. 5. Aortic atherosclerosis. 6. Myomatous uterus.  02-21-16: pelvic and bilateral hip x-ray: No evidence of  bilateral hip fracture. Moderate osteoarthritic changes of bilateral hips.   LABS REVIEWED:   02-17-16: wbc 7.0; hgb 11.9; hct 36.1; mcv 88.5; plt 157; glucose 93; bun 14; creat 1.19; k+ 3.5; na++ 138; liver normal albumin 3.5; urine culture: multiple species 02-18-16; hgb a1c 5.6; chol 214; ldl 142; trig 90; hdl 54; vit B 12: 353; ammonia 22; tsh 1.211; RPR: nr; HIV: nr 02-20-16; wbc 9.2; hgb 13.2; hct 39.9; mcv 87.9; plt 172; glucose 100; bun 8; creat 0.94; k+ 4.0; na++ 140; mag 1.6  04-14-16; wbc 8.2; hgb 12.7; hct 41.2; mcv 88.2; plt 244; glucose 106; bun 13.1; create 0.92; k+ 3.8; na++ 146; liver normal albumin 3.8 mag 2.1   Review of Systems Limited secondary to dementia  Today she is resting comfortably does not complaining of any acute discomfort-appears to be sleeping but was easily arousable and was able to speak-has some confusion but did answer simple questions appropriately--- currently is not complaining of any significant acute pain however when various extremities are move she does say she has some discomfort although this does not appear to be in acute discomfort and more reaction to the invasive maneuver    Physical Exam Constitutional: No distress--lying comfortably in bed.   her skin is warm and dry Eyes: Conjunctivae are normal.   Cardiovascular: Normal  rate, regular rhythm and intact distal pulses. Trace lower extremity edema  Respiratory: Effort normal and breath sounds normal. No respiratory distress. She has no wheezes.  GI: Soft. Bowel sounds are normal. She exhibits no distension. There is no tenderness.  Musculoskeletal: She exhibits scant  edema.  Able to move all extremities-this A she has some mild discomfort with flexion and extensio n of the knees and hips-apparently this is chronic  I do not note any deformities other than arthritic.  There does not appear to be significant tenderness to palpation of the back   Neurological: She is alert. No  lateralizing findings strength appears to be intact all 4  extremities Skin: Skin is warm and dry. Sall e is not diaphoretic.  Psychiatric: She has a normal mood and affect. Pleasant and appropriate oriented to self follow simple verbal commands  Assessment and plan.  #1-fall with no apparent injuries-again x-rays apparently were negative for any acute process-she does not appear to be in any distress acute discomfort but this will have to be monitored--speaking with nursing  feel at times she has had some increased back pain so we will obtain an x-ray of the area-- she is on Tylenol as needed for pain she also has a Lidoderm patch order for back.  Also will try to obtain updated labs  #2 insomnia-again this has been addressed by the psychiatric nurse practitioner.  [id number]

## 2016-09-19 DIAGNOSIS — M419 Scoliosis, unspecified: Secondary | ICD-10-CM | POA: Diagnosis not present

## 2016-09-19 DIAGNOSIS — Z96653 Presence of artificial knee joint, bilateral: Secondary | ICD-10-CM | POA: Diagnosis not present

## 2016-09-19 DIAGNOSIS — M160 Bilateral primary osteoarthritis of hip: Secondary | ICD-10-CM | POA: Diagnosis not present

## 2016-09-19 DIAGNOSIS — M4855XA Collapsed vertebra, not elsewhere classified, thoracolumbar region, initial encounter for fracture: Secondary | ICD-10-CM | POA: Diagnosis not present

## 2016-09-19 DIAGNOSIS — M47816 Spondylosis without myelopathy or radiculopathy, lumbar region: Secondary | ICD-10-CM | POA: Diagnosis not present

## 2016-09-19 DIAGNOSIS — Z79899 Other long term (current) drug therapy: Secondary | ICD-10-CM | POA: Diagnosis not present

## 2016-09-19 DIAGNOSIS — M79652 Pain in left thigh: Secondary | ICD-10-CM | POA: Diagnosis not present

## 2016-09-19 DIAGNOSIS — M545 Low back pain: Secondary | ICD-10-CM | POA: Diagnosis not present

## 2016-09-19 DIAGNOSIS — M5136 Other intervertebral disc degeneration, lumbar region: Secondary | ICD-10-CM | POA: Diagnosis not present

## 2016-09-19 DIAGNOSIS — M79651 Pain in right thigh: Secondary | ICD-10-CM | POA: Diagnosis not present

## 2016-09-19 LAB — BASIC METABOLIC PANEL
BUN: 16 (ref 4–21)
Creatinine: 1.1 (ref 0.5–1.1)
Glucose: 100
Potassium: 4 (ref 3.4–5.3)
Sodium: 145 (ref 137–147)

## 2016-09-19 LAB — HEPATIC FUNCTION PANEL
ALT: 18 (ref 7–35)
AST: 29 (ref 13–35)
Alkaline Phosphatase: 68 (ref 25–125)
Bilirubin, Total: 0.3

## 2016-09-19 LAB — CBC AND DIFFERENTIAL
HCT: 39 (ref 36–46)
Hemoglobin: 12.5 (ref 12.0–16.0)
Neutrophils Absolute: 8
Platelets: 207 (ref 150–399)
WBC: 9.3

## 2016-09-20 DIAGNOSIS — R319 Hematuria, unspecified: Secondary | ICD-10-CM | POA: Diagnosis not present

## 2016-09-20 DIAGNOSIS — N183 Chronic kidney disease, stage 3 (moderate): Secondary | ICD-10-CM | POA: Diagnosis not present

## 2016-09-21 DIAGNOSIS — F419 Anxiety disorder, unspecified: Secondary | ICD-10-CM | POA: Diagnosis not present

## 2016-09-21 DIAGNOSIS — F0390 Unspecified dementia without behavioral disturbance: Secondary | ICD-10-CM | POA: Diagnosis not present

## 2016-09-21 DIAGNOSIS — G4700 Insomnia, unspecified: Secondary | ICD-10-CM | POA: Diagnosis not present

## 2016-09-21 DIAGNOSIS — F209 Schizophrenia, unspecified: Secondary | ICD-10-CM | POA: Diagnosis not present

## 2016-09-25 DIAGNOSIS — R1310 Dysphagia, unspecified: Secondary | ICD-10-CM | POA: Diagnosis not present

## 2016-09-25 DIAGNOSIS — G9349 Other encephalopathy: Secondary | ICD-10-CM | POA: Diagnosis not present

## 2016-09-26 DIAGNOSIS — R1310 Dysphagia, unspecified: Secondary | ICD-10-CM | POA: Diagnosis not present

## 2016-09-26 DIAGNOSIS — G9349 Other encephalopathy: Secondary | ICD-10-CM | POA: Diagnosis not present

## 2016-09-27 DIAGNOSIS — R1310 Dysphagia, unspecified: Secondary | ICD-10-CM | POA: Diagnosis not present

## 2016-09-27 DIAGNOSIS — G9349 Other encephalopathy: Secondary | ICD-10-CM | POA: Diagnosis not present

## 2016-09-28 DIAGNOSIS — G9349 Other encephalopathy: Secondary | ICD-10-CM | POA: Diagnosis not present

## 2016-09-28 DIAGNOSIS — R1310 Dysphagia, unspecified: Secondary | ICD-10-CM | POA: Diagnosis not present

## 2016-10-01 DIAGNOSIS — G9349 Other encephalopathy: Secondary | ICD-10-CM | POA: Diagnosis not present

## 2016-10-01 DIAGNOSIS — R1310 Dysphagia, unspecified: Secondary | ICD-10-CM | POA: Diagnosis not present

## 2016-10-02 DIAGNOSIS — L84 Corns and callosities: Secondary | ICD-10-CM | POA: Diagnosis not present

## 2016-10-02 DIAGNOSIS — B351 Tinea unguium: Secondary | ICD-10-CM | POA: Diagnosis not present

## 2016-10-02 DIAGNOSIS — I70203 Unspecified atherosclerosis of native arteries of extremities, bilateral legs: Secondary | ICD-10-CM | POA: Diagnosis not present

## 2016-10-02 DIAGNOSIS — R1310 Dysphagia, unspecified: Secondary | ICD-10-CM | POA: Diagnosis not present

## 2016-10-02 DIAGNOSIS — M79671 Pain in right foot: Secondary | ICD-10-CM | POA: Diagnosis not present

## 2016-10-02 DIAGNOSIS — G9349 Other encephalopathy: Secondary | ICD-10-CM | POA: Diagnosis not present

## 2016-10-03 DIAGNOSIS — R1310 Dysphagia, unspecified: Secondary | ICD-10-CM | POA: Diagnosis not present

## 2016-10-03 DIAGNOSIS — G9349 Other encephalopathy: Secondary | ICD-10-CM | POA: Diagnosis not present

## 2016-10-04 DIAGNOSIS — G9349 Other encephalopathy: Secondary | ICD-10-CM | POA: Diagnosis not present

## 2016-10-04 DIAGNOSIS — R1310 Dysphagia, unspecified: Secondary | ICD-10-CM | POA: Diagnosis not present

## 2016-10-05 DIAGNOSIS — G9349 Other encephalopathy: Secondary | ICD-10-CM | POA: Diagnosis not present

## 2016-10-05 DIAGNOSIS — R1310 Dysphagia, unspecified: Secondary | ICD-10-CM | POA: Diagnosis not present

## 2016-10-08 DIAGNOSIS — G9349 Other encephalopathy: Secondary | ICD-10-CM | POA: Diagnosis not present

## 2016-10-08 DIAGNOSIS — R1310 Dysphagia, unspecified: Secondary | ICD-10-CM | POA: Diagnosis not present

## 2016-10-09 DIAGNOSIS — R1310 Dysphagia, unspecified: Secondary | ICD-10-CM | POA: Diagnosis not present

## 2016-10-09 DIAGNOSIS — G9349 Other encephalopathy: Secondary | ICD-10-CM | POA: Diagnosis not present

## 2016-10-10 DIAGNOSIS — G9349 Other encephalopathy: Secondary | ICD-10-CM | POA: Diagnosis not present

## 2016-10-10 DIAGNOSIS — R1310 Dysphagia, unspecified: Secondary | ICD-10-CM | POA: Diagnosis not present

## 2016-10-11 DIAGNOSIS — G9349 Other encephalopathy: Secondary | ICD-10-CM | POA: Diagnosis not present

## 2016-10-11 DIAGNOSIS — R1310 Dysphagia, unspecified: Secondary | ICD-10-CM | POA: Diagnosis not present

## 2016-10-12 DIAGNOSIS — R1310 Dysphagia, unspecified: Secondary | ICD-10-CM | POA: Diagnosis not present

## 2016-10-12 DIAGNOSIS — G9349 Other encephalopathy: Secondary | ICD-10-CM | POA: Diagnosis not present

## 2016-10-14 NOTE — Progress Notes (Signed)
Location:   [facility]   Place of Service:  [facility] (31)   CODE STATUS: dnr  No Known Allergies  Chief Complaint  Patient presents with  . Medical Management of Chronic Issues    HPI:  She is a long term resident of this facility being seen for the management of her chronic illnesses. Overall her status is stable. She tells me that she is feeling good. There ar eno nursing concerns at this time.    Past Medical History:  Diagnosis Date  . COPD (chronic obstructive pulmonary disease) (HCC)   . Dementia   . Depression   . Dysphagia   . Hypertension   . Scoliosis     Past Surgical History:  Procedure Laterality Date  . ABDOMINAL HYSTERECTOMY    . KNEE SURGERY    . SHOULDER SURGERY    . VITRECTOMY AND CATARACT      Social History   Social History  . Marital status: Single    Spouse name: N/A  . Number of children: N/A  . Years of education: N/A   Occupational History  . Not on file.   Social History Main Topics  . Smoking status: Never Smoker  . Smokeless tobacco: Never Used  . Alcohol use No  . Drug use: No  . Sexual activity: Not on file   Other Topics Concern  . Not on file   Social History Narrative  . No narrative on file   Family History  Problem Relation Age of Onset  . Stroke Neg Hx       VITAL SIGNS BP (!) 150/86   Pulse 60   Temp 98.1 F (36.7 C)   Resp 17   Ht 5\' 3"  (1.6 m)   Wt 126 lb 3.2 oz (57.2 kg)   SpO2 94%   BMI 22.36 kg/m   Patient's Medications  New Prescriptions   No medications on file  Previous Medications   ACETAMINOPHEN (TYLENOL) 325 MG TABLET    Take 2 tablets (650 mg total) by mouth every 6 (six) hours as needed for mild pain, moderate pain, fever or headache.   APIXABAN (ELIQUIS) 5 MG TABS TABLET    Take 1 tablet (5 mg total) by mouth 2 (two) times daily.   BUSPIRONE (BUSPAR) 7.5 MG TABLET    Take 7.5 mg by mouth 2 (two) times daily.   DONEPEZIL (ARICEPT) 10 MG TABLET    Take 1 tablet (10 mg total) by mouth  at bedtime.   LIDOCAINE (LIDODERM) 5 %    Place 1 patch onto the skin daily. Remove & Discard patch within 12 hours or as directed by MD   LORAZEPAM (ATIVAN) 0.5 MG TABLET    Take one tablet by mouth for one time   MAGNESIUM HYDROXIDE (MILK OF MAGNESIA PO)    Give 30 ml by mouth every 24 hours as needed for bowel management   METOPROLOL SUCCINATE (TOPROL-XL) 50 MG 24 HR TABLET    Take 1 tablet (50 mg total) by mouth daily. Take with or immediately following a meal.   RISPERIDONE (RISPERDAL) 0.5 MG TABLET    Take 0.5 mg by mouth at bedtime.   TRAMADOL (ULTRAM) 50 MG TABLET    Take one tablet by mouth for one time   TRAZODONE (DESYREL) 50 MG TABLET    Take 1 tablet (50 mg total) by mouth at bedtime.  Modified Medications   No medications on file  Discontinued Medications   No medications on file  SIGNIFICANT DIAGNOSTIC EXAMS   02-17-16: ct of head: 1. No acute intracranial findings. 2. Marked atrophy and moderate white matter microvascular disease.   02-18-16: mri/mra of head: 1.  No acute intracranial abnormality. 2. Chronic posterior right MCA infarct with superimposed bilateral white matter changes most suggestive of chronic small vessel disease. 3.  Negative intracranial MRA aside from mild arterial tortuosity. 4. Focal hyperostosis of the right frontal bone versus small right frontal meningioma, appears inconsequential.  02-18-16: carotid ultrasound: Bilateral: intimal wall thickening CCA. Mild soft plaque origin ICA. 1-39% ICA plaquing. Vertebral artery flow is antegrade.  02-19-16: ct of chest: Filling defects in multiple bilateral segmental and subsegmental pulmonary arteries consistent with pulmonary embolus. Clot burden slow. Diffuse cardiac enlargement likely represents pre-existing heart failure. Small pericardial effusion. Left thyroid gland nodule. Dilated pancreatic duct with possible low-attenuation lesion in the pancreatic tail.   02-19-16: 2-d echo: Left ventricle: The cavity  size was normal. Systolic function was normal. The estimated ejection fraction was in the range of 55% to 60%. Wall motion was normal; there were no regional wall motion abnormalities. Doppler parameters are consistent with abnormal left ventricular relaxation (grade 1 diastolic dysfunction). - Tricuspid valve: There was moderate regurgitation directed centrally. - Pulmonary arteries: PA peak pressure: 33 mm Hg (S). - Pericardium, extracardiac: A small to moderate, free-flowing pericardial effusion was identified circumferential to the heart. There was no evidence of hemodynamic compromise.  02-20-16: ct of abdomen and pelvis: 1. 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 Mild main pancreatic duct dilation. A follow-up pancreas protocol MRI (preferred if the patient can breath-hold) or CT abdomen without and with IV contrast is recommended in 12 months. This recommendation follows ACR consensus guidelines: Managing Incidental Findings on Abdominal CT: White Paper of the ACR Incidental Findings Committee. J Am Coll Radiol 2010;7:754-773. 2. Small moderate pericardial effusion/thickening. 3. Cholelithiasis.  No biliary ductal dilatation. 4. Moderate sigmoid diverticulosis. 5. Aortic atherosclerosis. 6. Myomatous uterus.   02-21-16: pelvic and bilateral hip x-ray: No evidence of bilateral hip fracture. Moderate osteoarthritic changes of bilateral hips.    LABS REVIEWED:   02-17-16: wbc 7.0; hgb 11.9; hct 36.1; mcv 88.5; plt 157; glucose 93; bun 14; creat 1.19; k+ 3.5; na++ 138; liver normal albumin 3.5; urine culture: multiple species 02-18-16; hgb a1c 5.6; chol 214; ldl 142; trig 90; hdl 54; vit B 12: 353; ammonia 22; tsh 1.211; RPR: nr; HIV: nr 02-20-16; wbc 9.2; hgb 13.2; hct 39.9; mcv 87.9; plt 172; glucose 100; bun 8; creat 0.94;  k+ 4.0; na++ 140; mag 1.6  04-14-16; wbc 8.2; hgb 12.7; hct 41.2; mcv 88.2; plt 244; glucose 106; bun 13.1; create 0.92; k+ 3.8; na++ 146; liver normal albumin 3.8 mag 2.1     Review of Systems  Constitutional: Negative for malaise/fatigue.  Respiratory: Negative for cough and shortness of breath.   Cardiovascular: Negative for chest pain, palpitations and leg swelling.  Gastrointestinal: Negative for abdominal pain, constipation and heartburn.  Musculoskeletal: Negative for back pain, joint pain and myalgias.  Skin: Negative.   Neurological: Negative for dizziness.  Psychiatric/Behavioral: The patient is not nervous/anxious.      Physical Exam  Constitutional: No distress.  Frail   Eyes: Conjunctivae are normal.  Neck: Neck supple. No JVD present. No thyromegaly present.  Cardiovascular: Normal rate, regular rhythm and intact distal pulses.   Respiratory: Effort normal and breath sounds  normal. No respiratory distress. She has no wheezes.  GI: Soft. Bowel sounds are normal. She exhibits no distension. There is no tenderness.  Musculoskeletal: She exhibits no edema.  Able to move all extremities   Lymphadenopathy:    She has no cervical adenopathy.  Neurological: She is alert.  Skin: Skin is warm and dry. She is not diaphoretic.  Psychiatric: She has a normal mood and affect.      ASSESSMENT/ PLAN:  1. Bilateral pulmonary emboli: is stable at this time; will continue eliquis  5 mg twice daily will monitor  2. Vascular dementia; her current weight is 126 pounds; will continue aricept 10 mg nightly  3. Dyslipidemia:  daily ldl 142 pravachol has been stopped   4. Hypertension: will continue toprol xl 50 mg daily  5. Diastolic heart failure: has a history of pericardial effusion will continue toprol xl 50 mg daily  6. COPD: is presently without change will continue to monitor  7. Thyroid nodule: tsh is 1.211  8. Dementia: will continue aricept 10 mg daily weight is 126  pounds  9. CKD stage III: bun/creat  13.1/0.92  10. Anemia: hgb is 12.7  11.  Psychosis: will continue risperdal 0.5 mg nightly will continue trazodone 50 mg nightly     MD is aware of resident's narcotic use and is in agreement with current plan of care. We will attempt to wean resident as apropriate   Synthia Innocenteborah Green NP [facility]  Contact [phone] Monday through Friday 8am- 5pm  After hours call [phone]

## 2016-10-15 DIAGNOSIS — G9349 Other encephalopathy: Secondary | ICD-10-CM | POA: Diagnosis not present

## 2016-10-15 DIAGNOSIS — R1310 Dysphagia, unspecified: Secondary | ICD-10-CM | POA: Diagnosis not present

## 2016-10-16 ENCOUNTER — Non-Acute Institutional Stay (SKILLED_NURSING_FACILITY): Payer: Medicare Other | Admitting: Adult Health

## 2016-10-16 ENCOUNTER — Encounter: Payer: Self-pay | Admitting: Adult Health

## 2016-10-16 DIAGNOSIS — N183 Chronic kidney disease, stage 3 unspecified: Secondary | ICD-10-CM

## 2016-10-16 DIAGNOSIS — E041 Nontoxic single thyroid nodule: Secondary | ICD-10-CM

## 2016-10-16 DIAGNOSIS — G309 Alzheimer's disease, unspecified: Secondary | ICD-10-CM

## 2016-10-16 DIAGNOSIS — I5032 Chronic diastolic (congestive) heart failure: Secondary | ICD-10-CM

## 2016-10-16 DIAGNOSIS — I10 Essential (primary) hypertension: Secondary | ICD-10-CM | POA: Diagnosis not present

## 2016-10-16 DIAGNOSIS — R1310 Dysphagia, unspecified: Secondary | ICD-10-CM | POA: Diagnosis not present

## 2016-10-16 DIAGNOSIS — G9349 Other encephalopathy: Secondary | ICD-10-CM | POA: Diagnosis not present

## 2016-10-16 DIAGNOSIS — F0280 Dementia in other diseases classified elsewhere without behavioral disturbance: Secondary | ICD-10-CM

## 2016-10-16 DIAGNOSIS — J438 Other emphysema: Secondary | ICD-10-CM

## 2016-10-16 DIAGNOSIS — D649 Anemia, unspecified: Secondary | ICD-10-CM | POA: Diagnosis not present

## 2016-10-16 DIAGNOSIS — I2699 Other pulmonary embolism without acute cor pulmonale: Secondary | ICD-10-CM

## 2016-10-16 NOTE — Progress Notes (Signed)
Location:   [facility] Room Number: 214 B Place of Service:  SNF (31)   CODE STATUS:  DNR  No Known Allergies  Chief Complaint  Patient presents with  . Medical Management of Chronic Issues    Routine Visit    HPI:  She is a long term resident of this facility being seen for the management of her chronic illnesses. Overall her status is without change. She is not voicing any complaints today. There are no nursing concerns at this time.    Past Medical History:  Diagnosis Date  . COPD (chronic obstructive pulmonary disease) (HCC)   . Dementia   . Depression   . Dysphagia   . Hypertension   . Scoliosis     Past Surgical History:  Procedure Laterality Date  . ABDOMINAL HYSTERECTOMY    . KNEE SURGERY    . SHOULDER SURGERY    . VITRECTOMY AND CATARACT      Social History   Social History  . Marital status: Single    Spouse name: N/A  . Number of children: N/A  . Years of education: N/A   Occupational History  . Not on file.   Social History Main Topics  . Smoking status: Never Smoker  . Smokeless tobacco: Never Used  . Alcohol use No  . Drug use: No  . Sexual activity: Not on file   Other Topics Concern  . Not on file   Social History Narrative  . No narrative on file   Family History  Problem Relation Age of Onset  . Stroke Neg Hx       VITAL SIGNS BP 124/66   Pulse 72   Temp 98.7 F (37.1 C)   Resp 16   Wt 127 lb 12.8 oz (58 kg)   SpO2 98%   BMI 22.64 kg/m   Patient's Medications  New Prescriptions   No medications on file  Previous Medications   ACETAMINOPHEN (TYLENOL) 325 MG TABLET    Take 2 tablets (650 mg total) by mouth every 6 (six) hours as needed for mild pain, moderate pain, fever or headache.   APIXABAN (ELIQUIS) 2.5 MG TABS TABLET    Take 2.5 mg by mouth 2 (two) times daily.   BUSPIRONE (BUSPAR) 15 MG TABLET    Give 0.5 mg tablet by mouth daily and 1 tablet by mouth at bedtime   DONEPEZIL (ARICEPT) 10 MG  TABLET    Take 1 tablet (10 mg total) by mouth at bedtime.   LIDOCAINE (LIDODERM) 5 %    Place 1 patch onto the skin daily. Remove & Discard patch within 12 hours or as directed by MD   MAGNESIUM HYDROXIDE (MILK OF MAGNESIA PO)    Give 30 ml by mouth every 24 hours as needed for bowel management   METOPROLOL SUCCINATE (TOPROL-XL) 50 MG 24 HR TABLET    Take 1 tablet (50 mg total) by mouth daily. Take with or immediately following a meal.   RISPERIDONE (RISPERDAL) 0.5 MG TABLET    Take 0.5 mg by mouth at bedtime.   TRAZODONE (DESYREL) 100 MG TABLET    Take 100 mg by mouth at bedtime.   UNABLE TO FIND    Med Pass - take 120 cc by mouth daily  Modified Medications   No medications on file  Discontinued Medications   APIXABAN (ELIQUIS) 5 MG TABS TABLET    Take 1 tablet (5 mg total) by mouth 2 (two) times daily.   BUSPIRONE (  BUSPAR) 7.5 MG TABLET    Take 7.5 mg by mouth 2 (two) times daily.   LORAZEPAM (ATIVAN) 0.5 MG TABLET    Take one tablet by mouth for one time   TRAMADOL (ULTRAM) 50 MG TABLET    Take one tablet by mouth for one time   TRAZODONE (DESYREL) 50 MG TABLET    Take 1 tablet (50 mg total) by mouth at bedtime.     SIGNIFICANT DIAGNOSTIC EXAMS  02-17-16: ct of head: 1. No acute intracranial findings. 2. Marked atrophy and moderate white matter microvascular disease.   02-18-16: mri/mra of head: 1.  No acute intracranial abnormality. 2. Chronic posterior right MCA infarct with superimposed bilateral white matter changes most suggestive of chronic small vessel disease. 3.  Negative intracranial MRA aside from mild arterial tortuosity. 4. Focal hyperostosis of the right frontal bone versus small right frontal meningioma, appears inconsequential.  02-18-16: carotid ultrasound: Bilateral: intimal wall thickening CCA. Mild soft plaque origin ICA. 1-39% ICA plaquing. Vertebral artery flow is antegrade.  02-19-16: ct of chest: Filling defects in multiple bilateral segmental and subsegmental  pulmonary arteries consistent with pulmonary embolus. Clot burden slow. Diffuse cardiac enlargement likely represents pre-existing heart failure. Small pericardial effusion. Left thyroid gland nodule. Dilated pancreatic duct with possible low-attenuation lesion in the pancreatic tail.   02-19-16: 2-d echo: Left ventricle: The cavity size was normal. Systolic function was normal. The estimated ejection fraction was in the range of 55% to 60%. Wall motion was normal; there were no regional wall motion abnormalities. Doppler parameters are consistent with abnormal left ventricular relaxation (grade 1 diastolic dysfunction). - Tricuspid valve: There was moderate regurgitation directed centrally. - Pulmonary arteries: PA peak pressure: 33 mm Hg (S). - Pericardium, extracardiac: A small to moderate, free-flowing pericardial effusion was identified circumferential to the heart. There was no evidence of hemodynamic compromise.  02-20-16: ct of abdomen and pelvis: 1. 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 Mild main pancreatic duct dilation. A follow-up pancreas protocol MRI (preferred if the patient can breath-hold) or CT abdomen without and with IV contrast is recommended in 12 months. This recommendation follows ACR consensus guidelines: Managing Incidental Findings on Abdominal CT: White Paper of the ACR Incidental Findings Committee. J Am Coll Radiol 2010;7:754-773. 2. Small moderate pericardial effusion/thickening. 3. Cholelithiasis.  No biliary ductal dilatation. 4. Moderate sigmoid diverticulosis. 5. Aortic atherosclerosis. 6. Myomatous uterus.   02-21-16: pelvic and bilateral hip x-ray: No evidence of bilateral hip fracture. Moderate osteoarthritic changes of bilateral hips.    LABS REVIEWED:   02-17-16: wbc 7.0; hgb 11.9; hct  36.1; mcv 88.5; plt 157; glucose 93; bun 14; creat 1.19; k+ 3.5; na++ 138; liver normal albumin 3.5; urine culture: multiple species 02-18-16; hgb a1c 5.6; chol 214; ldl 142; trig 90; hdl 54; vit B 12: 353; ammonia 22; tsh 1.211; RPR: nr; HIV: nr 02-20-16; wbc 9.2; hgb 13.2; hct 39.9; mcv 87.9; plt 172; glucose 100; bun 8; creat 0.94; k+ 4.0; na++ 140; mag 1.6  04-14-16; wbc 8.2; hgb 12.7; hct 41.2; mcv 88.2; plt 244; glucose 106; bun 13.1; create 0.92; k+ 3.8; na++ 146; liver normal albumin 3.8 mag 2.1     Review of Systems  Constitutional: Negative for malaise/fatigue.  Respiratory: Negative for cough and shortness of breath.   Cardiovascular: Negative for chest pain, palpitations and leg swelling.  Gastrointestinal: Negative for abdominal pain, constipation  and heartburn.  Musculoskeletal: Negative for back pain, joint pain and myalgias.  Skin: Negative.   Neurological: Negative for dizziness.  Psychiatric/Behavioral: The patient is not nervous/anxious.      Physical Exam  Constitutional: No distress.  Frail   Eyes: Conjunctivae are normal.  Neck: Neck supple. No JVD present. No thyromegaly present.  Cardiovascular: Normal rate, regular rhythm and intact distal pulses.   Respiratory: Effort normal and breath sounds normal. No respiratory distress. She has no wheezes.  GI: Soft. Bowel sounds are normal. She exhibits no distension. There is no tenderness.  Musculoskeletal: She exhibits no edema.  Able to move all extremities   Lymphadenopathy:    She has no cervical adenopathy.  Neurological: She is alert.  Skin: Skin is warm and dry. She is not diaphoretic.  Psychiatric: She has a normal mood and affect.      ASSESSMENT/ PLAN:  1. Bilateral pulmonary emboli: is stable at this time; will continue eliquis 2. 5 mg twice daily will monitor  2. Vascular dementia; her current weight is 127 pounds; will continue aricept 10 mg nightly  3. Dyslipidemia:  daily ldl 142 pravachol has been  stopped   4. Hypertension: will continue toprol xl 50 mg daily  5. Diastolic heart failure: has a history of pericardial effusion will continue toprol xl 50 mg daily  6. COPD: is presently without change will continue to monitor  7. Thyroid nodule: tsh is 1.211  8. Dementia: will continue aricept 10 mg daily weight is 127 pounds  9. CKD stage III: bun/creat  13.1/0.92  10. Anemia: hgb is 12.7  11.  Psychosis: will continue risperdal 0.5 mg nightly will continue trazodone 100 mg nightly takes buspar 7.5 mg nightly for anxiety   12. CVA: is neurologically stable; will continue eliquis 2.5 mg twice daily     Synthia Innocent NP [facility]  Contact [phone] Monday through Friday 8am- 5pm  After hours call [phone]

## 2016-10-17 DIAGNOSIS — R1310 Dysphagia, unspecified: Secondary | ICD-10-CM | POA: Diagnosis not present

## 2016-10-17 DIAGNOSIS — Z79899 Other long term (current) drug therapy: Secondary | ICD-10-CM | POA: Diagnosis not present

## 2016-10-17 DIAGNOSIS — G9349 Other encephalopathy: Secondary | ICD-10-CM | POA: Diagnosis not present

## 2016-10-18 DIAGNOSIS — G9349 Other encephalopathy: Secondary | ICD-10-CM | POA: Diagnosis not present

## 2016-10-18 DIAGNOSIS — R1310 Dysphagia, unspecified: Secondary | ICD-10-CM | POA: Diagnosis not present

## 2016-10-19 DIAGNOSIS — R1310 Dysphagia, unspecified: Secondary | ICD-10-CM | POA: Diagnosis not present

## 2016-10-19 DIAGNOSIS — G9349 Other encephalopathy: Secondary | ICD-10-CM | POA: Diagnosis not present

## 2016-10-23 DIAGNOSIS — G9349 Other encephalopathy: Secondary | ICD-10-CM | POA: Diagnosis not present

## 2016-10-23 DIAGNOSIS — R1310 Dysphagia, unspecified: Secondary | ICD-10-CM | POA: Diagnosis not present

## 2016-10-24 DIAGNOSIS — G9349 Other encephalopathy: Secondary | ICD-10-CM | POA: Diagnosis not present

## 2016-10-24 DIAGNOSIS — R1310 Dysphagia, unspecified: Secondary | ICD-10-CM | POA: Diagnosis not present

## 2016-10-25 DIAGNOSIS — G9349 Other encephalopathy: Secondary | ICD-10-CM | POA: Diagnosis not present

## 2016-10-25 DIAGNOSIS — R1310 Dysphagia, unspecified: Secondary | ICD-10-CM | POA: Diagnosis not present

## 2016-10-26 DIAGNOSIS — R1310 Dysphagia, unspecified: Secondary | ICD-10-CM | POA: Diagnosis not present

## 2016-10-26 DIAGNOSIS — G9349 Other encephalopathy: Secondary | ICD-10-CM | POA: Diagnosis not present

## 2016-10-29 DIAGNOSIS — G9349 Other encephalopathy: Secondary | ICD-10-CM | POA: Diagnosis not present

## 2016-10-29 DIAGNOSIS — R1310 Dysphagia, unspecified: Secondary | ICD-10-CM | POA: Diagnosis not present

## 2016-10-30 DIAGNOSIS — G9349 Other encephalopathy: Secondary | ICD-10-CM | POA: Diagnosis not present

## 2016-10-30 DIAGNOSIS — R1310 Dysphagia, unspecified: Secondary | ICD-10-CM | POA: Diagnosis not present

## 2016-10-31 DIAGNOSIS — G9349 Other encephalopathy: Secondary | ICD-10-CM | POA: Diagnosis not present

## 2016-10-31 DIAGNOSIS — R1310 Dysphagia, unspecified: Secondary | ICD-10-CM | POA: Diagnosis not present

## 2016-11-01 DIAGNOSIS — G9349 Other encephalopathy: Secondary | ICD-10-CM | POA: Diagnosis not present

## 2016-11-01 DIAGNOSIS — R1310 Dysphagia, unspecified: Secondary | ICD-10-CM | POA: Diagnosis not present

## 2016-11-02 DIAGNOSIS — R1310 Dysphagia, unspecified: Secondary | ICD-10-CM | POA: Diagnosis not present

## 2016-11-02 DIAGNOSIS — G9349 Other encephalopathy: Secondary | ICD-10-CM | POA: Diagnosis not present

## 2016-11-14 ENCOUNTER — Encounter: Payer: Self-pay | Admitting: Adult Health

## 2016-11-14 ENCOUNTER — Non-Acute Institutional Stay (SKILLED_NURSING_FACILITY): Payer: Medicare Other | Admitting: Adult Health

## 2016-11-14 DIAGNOSIS — I6930 Unspecified sequelae of cerebral infarction: Secondary | ICD-10-CM

## 2016-11-14 DIAGNOSIS — I5032 Chronic diastolic (congestive) heart failure: Secondary | ICD-10-CM | POA: Diagnosis not present

## 2016-11-14 DIAGNOSIS — E041 Nontoxic single thyroid nodule: Secondary | ICD-10-CM | POA: Diagnosis not present

## 2016-11-14 DIAGNOSIS — I2782 Chronic pulmonary embolism: Secondary | ICD-10-CM

## 2016-11-14 DIAGNOSIS — Z8673 Personal history of transient ischemic attack (TIA), and cerebral infarction without residual deficits: Secondary | ICD-10-CM

## 2016-11-14 DIAGNOSIS — D649 Anemia, unspecified: Secondary | ICD-10-CM | POA: Diagnosis not present

## 2016-11-14 DIAGNOSIS — E782 Mixed hyperlipidemia: Secondary | ICD-10-CM

## 2016-11-14 DIAGNOSIS — N183 Chronic kidney disease, stage 3 unspecified: Secondary | ICD-10-CM

## 2016-11-14 DIAGNOSIS — J438 Other emphysema: Secondary | ICD-10-CM

## 2016-11-14 DIAGNOSIS — I10 Essential (primary) hypertension: Secondary | ICD-10-CM | POA: Diagnosis not present

## 2016-11-14 NOTE — Progress Notes (Signed)
Location:   [facility] Room Number: 214 B Place of Service:  SNF (31)   CODE STATUS: DNR  No Known Allergies  Chief Complaint  Patient presents with  . Medical Management of Chronic Issues    1 month follow up    HPI:  She is a long term resident of this facility being seen for the management of her chronic illnesses. Overall her status is stable. She does ambulate with a walker. She tells me that she is feeling good. There are no nursing concerns at this time.    Past Medical History:  Diagnosis Date  . COPD (chronic obstructive pulmonary disease) (HCC)   . Dementia   . Depression   . Dysphagia   . Hypertension   . Scoliosis     Past Surgical History:  Procedure Laterality Date  . ABDOMINAL HYSTERECTOMY    . KNEE SURGERY    . SHOULDER SURGERY    . VITRECTOMY AND CATARACT      Social History   Social History  . Marital status: Single    Spouse name: N/A  . Number of children: N/A  . Years of education: N/A   Occupational History  . Not on file.   Social History Main Topics  . Smoking status: Never Smoker  . Smokeless tobacco: Never Used  . Alcohol use No  . Drug use: No  . Sexual activity: Not on file   Other Topics Concern  . Not on file   Social History Narrative  . No narrative on file   Family History  Problem Relation Age of Onset  . Stroke Neg Hx       VITAL SIGNS BP 130/85   Pulse 80   Temp 97.8 F (36.6 C)   Resp 18   Ht  (1.6 m)   Wt 127 lb 13 oz (58 kg)   SpO2 97%   BMI 22.64 kg/m   Patient's Medications  New Prescriptions   No medications on file  Previous Medications   ACETAMINOPHEN (TYLENOL) 325 MG TABLET    Take 2 tablets (650 mg total) by mouth every 6 (six) hours as needed for mild pain, moderate pain, fever or headache.   APIXABAN (ELIQUIS) 2.5 MG TABS TABLET    Take 2.5 mg by mouth 2 (two) times daily.   BUSPIRONE (BUSPAR) 15 MG TABLET    Give 0.5 mg tablet by mouth daily and 1 tablet by mouth  at bedtime   DONEPEZIL (ARICEPT) 10 MG TABLET    Take 1 tablet (10 mg total) by mouth at bedtime.   LIDOCAINE (LIDODERM) 5 %    Place 1 patch onto the skin daily. Apply to lower back.  Remove & Discard patch within 12 hours or as directed by MD   MAGNESIUM HYDROXIDE (MILK OF MAGNESIA PO)    Give 30 ml by mouth every 24 hours as needed for bowel management   METOPROLOL SUCCINATE (TOPROL-XL) 50 MG 24 HR TABLET    Take 1 tablet (50 mg total) by mouth daily. Take with or immediately following a meal.   RISPERIDONE (RISPERDAL) 0.5 MG TABLET    Take 0.5 mg by mouth at bedtime.   UNABLE TO FIND    Med Pass - take 120 cc by mouth daily  Modified Medications   No medications on file  Discontinued Medications   LIDOCAINE (LIDODERM) 5 %    Place 1 patch onto the skin daily. Remove & Discard patch within 12 hours or  as directed by MD   TRAZODONE (DESYREL) 100 MG TABLET    Take 100 mg by mouth at bedtime.     SIGNIFICANT DIAGNOSTIC EXAMS  02-17-16: ct of head: 1. No acute intracranial findings. 2. Marked atrophy and moderate white matter microvascular disease.   02-18-16: mri/mra of head: 1.  No acute intracranial abnormality. 2. Chronic posterior right MCA infarct with superimposed bilateral white matter changes most suggestive of chronic small vessel disease. 3.  Negative intracranial MRA aside from mild arterial tortuosity. 4. Focal hyperostosis of the right frontal bone versus small right frontal meningioma, appears inconsequential.  02-18-16: carotid ultrasound: Bilateral: intimal wall thickening CCA. Mild soft plaque origin ICA. 1-39% ICA plaquing. Vertebral artery flow is antegrade.  02-19-16: ct of chest: Filling defects in multiple bilateral segmental and subsegmental pulmonary arteries consistent with pulmonary embolus. Clot burden slow. Diffuse cardiac enlargement likely represents pre-existing heart failure. Small pericardial effusion. Left thyroid gland nodule. Dilated pancreatic duct with  possible low-attenuation lesion in the pancreatic tail.   02-19-16: 2-d echo: Left ventricle: The cavity size was normal. Systolic function was normal. The estimated ejection fraction was in the range of 55% to 60%. Wall motion was normal; there were no regional wall motion abnormalities. Doppler parameters are consistent with abnormal left ventricular relaxation (grade 1 diastolic dysfunction). - Tricuspid valve: There was moderate regurgitation directed centrally. - Pulmonary arteries: PA peak pressure: 33 mm Hg (S). - Pericardium, extracardiac: A small to moderate, free-flowing pericardial effusion was identified circumferential to the heart. There was no evidence of hemodynamic compromise.  02-20-16: ct of abdomen and pelvis: 1. 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 Mild main pancreatic duct dilation. A follow-up pancreas protocol MRI (preferred if the patient can breath-hold) or CT abdomen without and with IV contrast is recommended in 12 months. This recommendation follows ACR consensus guidelines: Managing Incidental Findings on Abdominal CT: White Paper of the ACR Incidental Findings Committee. J Am Coll Radiol 2010;7:754-773. 2. Small moderate pericardial effusion/thickening. 3. Cholelithiasis.  No biliary ductal dilatation. 4. Moderate sigmoid diverticulosis. 5. Aortic atherosclerosis. 6. Myomatous uterus.   02-21-16: pelvic and bilateral hip x-ray: No evidence of bilateral hip fracture. Moderate osteoarthritic changes of bilateral hips.    LABS REVIEWED:   02-17-16: wbc 7.0; hgb 11.9; hct 36.1; mcv 88.5; plt 157; glucose 93; bun 14; creat 1.19; k+ 3.5; na++ 138; liver normal albumin 3.5; urine culture: multiple species 02-18-16; hgb a1c 5.6; chol 214; ldl 142; trig 90; hdl 54; vit B 12: 353; ammonia 22; tsh 1.211;  RPR: nr; HIV: nr 02-20-16; wbc 9.2; hgb 13.2; hct 39.9; mcv 87.9; plt 172; glucose 100; bun 8; creat 0.94; k+ 4.0; na++ 140; mag 1.6  04-14-16; wbc 8.2; hgb 12.7; hct 41.2; mcv 88.2; plt 244; glucose 106; bun 13.1; create 0.92; k+ 3.8; na++ 146; liver normal albumin 3.8 mag 2.1    10-17-16; wbc 5.2; hgb 14.0; hct 44.3; mcv 89.1; plt 217; glucose 126; bun 21.6; creat 1.03; k+ 4.0; na++ 145; liver normal albumin 4.1; chol 193; ldl 116; trig 75; hdl 62; hgb a1c 6.3     Review of Systems  Constitutional: Negative for malaise/fatigue.  Respiratory: Negative for cough and shortness of breath.   Cardiovascular: Negative for chest pain, palpitations and leg swelling.  Gastrointestinal: Negative for abdominal pain, constipation and heartburn.  Musculoskeletal: Negative for back pain, joint pain and myalgias.  Skin: Negative.   Neurological: Negative for dizziness.  Psychiatric/Behavioral: The patient is not nervous/anxious.      Physical Exam  Constitutional: No distress.  Frail   Eyes: Conjunctivae are normal.  Neck: Neck supple. No JVD present. No thyromegaly present.  Cardiovascular: Normal rate, regular rhythm and intact distal pulses.   Respiratory: Effort normal and breath sounds normal. No respiratory distress. She has no wheezes.  GI: Soft. Bowel sounds are normal. She exhibits no distension. There is no tenderness.  Musculoskeletal: She exhibits no edema.  Able to move all extremities Has contracture of neck    Lymphadenopathy:    She has no cervical adenopathy.  Neurological: She is alert.  Skin: Skin is warm and dry. She is not diaphoretic.  Psychiatric: She has a normal mood and affect.      ASSESSMENT/ PLAN:  1. Bilateral pulmonary emboli: is stable at this time; will continue eliquis 2. 5 mg twice daily will monitor  2. Vascular dementia; her current weight is 127 pounds; will continue aricept 10 mg nightly  3. Dyslipidemia:  daily ldl 116 pravachol has been stopped    4. Hypertension: b/p 130/85 will continue toprol xl 50 mg daily  5. Diastolic heart failure: has a history of pericardial effusion will continue toprol xl 50 mg daily  6. COPD: is presently without change will continue to monitor  7. Thyroid nodule: tsh is 1.211  8. CKD stage III: bun/creat  21.6/1.03  9. Anemia: hgb is 14.0  10.  Psychosis: will continue risperdal 0.5 mg nightly  takes buspar 7.5 mg nightly for anxiety   11. CVA: is neurologically stable; will continue eliquis 2.5 mg twice daily    MD is aware of resident's narcotic use and is in agreement with current plan of care. We will attempt to wean resident as apropriate    Synthia Innocent NP [facility]  Contact [phone] Monday through Friday 8am- 5pm  After hours call [phone]

## 2016-11-28 DIAGNOSIS — F209 Schizophrenia, unspecified: Secondary | ICD-10-CM | POA: Diagnosis not present

## 2016-11-28 DIAGNOSIS — F0390 Unspecified dementia without behavioral disturbance: Secondary | ICD-10-CM | POA: Diagnosis not present

## 2016-11-28 DIAGNOSIS — F419 Anxiety disorder, unspecified: Secondary | ICD-10-CM | POA: Diagnosis not present

## 2016-11-28 DIAGNOSIS — G4700 Insomnia, unspecified: Secondary | ICD-10-CM | POA: Diagnosis not present

## 2016-12-05 DIAGNOSIS — Z8673 Personal history of transient ischemic attack (TIA), and cerebral infarction without residual deficits: Secondary | ICD-10-CM | POA: Insufficient documentation

## 2016-12-05 DIAGNOSIS — I2699 Other pulmonary embolism without acute cor pulmonale: Secondary | ICD-10-CM | POA: Insufficient documentation

## 2016-12-06 DIAGNOSIS — D649 Anemia, unspecified: Secondary | ICD-10-CM | POA: Diagnosis not present

## 2016-12-06 DIAGNOSIS — F0280 Dementia in other diseases classified elsewhere without behavioral disturbance: Secondary | ICD-10-CM | POA: Diagnosis not present

## 2016-12-06 LAB — BASIC METABOLIC PANEL
BUN: 27 mg/dL — AB (ref 4–21)
Creatinine: 1.2 mg/dL — AB (ref 0.5–1.1)
Glucose: 123 mg/dL
Potassium: 4.5 mmol/L (ref 3.4–5.3)
Sodium: 144 mmol/L (ref 137–147)

## 2016-12-06 LAB — CBC AND DIFFERENTIAL
HCT: 43 % (ref 36–46)
Hemoglobin: 13.8 g/dL (ref 12.0–16.0)
Neutrophils Absolute: 5 /uL
Platelets: 219 10*3/uL (ref 150–399)
WBC: 7.1 10^3/mL

## 2016-12-07 ENCOUNTER — Other Ambulatory Visit: Payer: Self-pay | Admitting: *Deleted

## 2016-12-07 DIAGNOSIS — R1310 Dysphagia, unspecified: Secondary | ICD-10-CM | POA: Diagnosis not present

## 2016-12-07 DIAGNOSIS — G9349 Other encephalopathy: Secondary | ICD-10-CM | POA: Diagnosis not present

## 2016-12-07 DIAGNOSIS — R1311 Dysphagia, oral phase: Secondary | ICD-10-CM | POA: Diagnosis not present

## 2016-12-07 MED ORDER — LORAZEPAM 0.5 MG PO TABS: ORAL_TABLET | ORAL | 0 refills | Status: DC

## 2016-12-07 NOTE — Telephone Encounter (Signed)
[facility] #[phone] Fax:[phone]  

## 2016-12-09 DIAGNOSIS — G9349 Other encephalopathy: Secondary | ICD-10-CM | POA: Diagnosis not present

## 2016-12-09 DIAGNOSIS — R1311 Dysphagia, oral phase: Secondary | ICD-10-CM | POA: Diagnosis not present

## 2016-12-09 DIAGNOSIS — R1310 Dysphagia, unspecified: Secondary | ICD-10-CM | POA: Diagnosis not present

## 2016-12-10 DIAGNOSIS — R1310 Dysphagia, unspecified: Secondary | ICD-10-CM | POA: Diagnosis not present

## 2016-12-10 DIAGNOSIS — R1311 Dysphagia, oral phase: Secondary | ICD-10-CM | POA: Diagnosis not present

## 2016-12-10 DIAGNOSIS — G9349 Other encephalopathy: Secondary | ICD-10-CM | POA: Diagnosis not present

## 2016-12-11 DIAGNOSIS — G9349 Other encephalopathy: Secondary | ICD-10-CM | POA: Diagnosis not present

## 2016-12-11 DIAGNOSIS — R1310 Dysphagia, unspecified: Secondary | ICD-10-CM | POA: Diagnosis not present

## 2016-12-11 DIAGNOSIS — R1311 Dysphagia, oral phase: Secondary | ICD-10-CM | POA: Diagnosis not present

## 2016-12-12 ENCOUNTER — Non-Acute Institutional Stay (SKILLED_NURSING_FACILITY): Payer: Medicare Other | Admitting: Adult Health

## 2016-12-12 ENCOUNTER — Encounter: Payer: Self-pay | Admitting: Adult Health

## 2016-12-12 DIAGNOSIS — J438 Other emphysema: Secondary | ICD-10-CM

## 2016-12-12 DIAGNOSIS — R1311 Dysphagia, oral phase: Secondary | ICD-10-CM | POA: Diagnosis not present

## 2016-12-12 DIAGNOSIS — N183 Chronic kidney disease, stage 3 unspecified: Secondary | ICD-10-CM

## 2016-12-12 DIAGNOSIS — I2782 Chronic pulmonary embolism: Secondary | ICD-10-CM | POA: Diagnosis not present

## 2016-12-12 DIAGNOSIS — I10 Essential (primary) hypertension: Secondary | ICD-10-CM | POA: Diagnosis not present

## 2016-12-12 DIAGNOSIS — F0280 Dementia in other diseases classified elsewhere without behavioral disturbance: Secondary | ICD-10-CM | POA: Diagnosis not present

## 2016-12-12 DIAGNOSIS — I6930 Unspecified sequelae of cerebral infarction: Secondary | ICD-10-CM

## 2016-12-12 DIAGNOSIS — I5032 Chronic diastolic (congestive) heart failure: Secondary | ICD-10-CM

## 2016-12-12 DIAGNOSIS — Z8673 Personal history of transient ischemic attack (TIA), and cerebral infarction without residual deficits: Secondary | ICD-10-CM

## 2016-12-12 DIAGNOSIS — E041 Nontoxic single thyroid nodule: Secondary | ICD-10-CM

## 2016-12-12 DIAGNOSIS — G9349 Other encephalopathy: Secondary | ICD-10-CM | POA: Diagnosis not present

## 2016-12-12 DIAGNOSIS — E782 Mixed hyperlipidemia: Secondary | ICD-10-CM | POA: Diagnosis not present

## 2016-12-12 DIAGNOSIS — G309 Alzheimer's disease, unspecified: Secondary | ICD-10-CM

## 2016-12-12 DIAGNOSIS — R1310 Dysphagia, unspecified: Secondary | ICD-10-CM | POA: Diagnosis not present

## 2016-12-12 NOTE — Progress Notes (Signed)
Location:   [facility] Room Number: 214 B Place of Service:  SNF (31)   CODE STATUS: DNR  No Known Allergies  Chief Complaint  Patient presents with  . Medical Management of Chronic Issues    1 month follow up    HPI:  She is a long term resident of this facility being seen for the management of her chronic illnesses. Overall there is little change in her status. She tells me that she has no complaints and does not have any complaints.  There are no nursing concerns at this time.    Past Medical History:  Diagnosis Date  . COPD (chronic obstructive pulmonary disease) (HCC)   . Dementia   . Depression   . Dysphagia   . Hypertension   . Scoliosis     Past Surgical History:  Procedure Laterality Date  . ABDOMINAL HYSTERECTOMY    . KNEE SURGERY    . SHOULDER SURGERY    . VITRECTOMY AND CATARACT      Social History   Social History  . Marital status: Single    Spouse name: N/A  . Number of children: N/A  . Years of education: N/A   Occupational History  . Not on file.   Social History Main Topics  . Smoking status: Never Smoker  . Smokeless tobacco: Never Used  . Alcohol use No  . Drug use: No  . Sexual activity: Not on file   Other Topics Concern  . Not on file   Social History Narrative  . No narrative on file   Family History  Problem Relation Age of Onset  . Stroke Neg Hx       VITAL SIGNS BP 131/74   Pulse 69   Temp 97.1 F (36.2 C)   Resp 18   Ht  (1.6 m)   Wt 127 lb 13 oz (58 kg)   SpO2 99%   BMI 22.64 kg/m   Patient's Medications  New Prescriptions   No medications on file  Previous Medications   ACETAMINOPHEN (TYLENOL) 325 MG TABLET    Take 2 tablets (650 mg total) by mouth every 6 (six) hours as needed for mild pain, moderate pain, fever or headache.   APIXABAN (ELIQUIS) 2.5 MG TABS TABLET    Take 2.5 mg by mouth 2 (two) times daily.   BUSPIRONE (BUSPAR) 15 MG TABLET    Give 0.5 mg tablet by mouth daily and  1 tablet by mouth at bedtime   DONEPEZIL (ARICEPT) 10 MG TABLET    Take 1 tablet (10 mg total) by mouth at bedtime.   LIDOCAINE (LIDODERM) 5 %    Place 1 patch onto the skin daily. Apply to lower back.  Remove & Discard patch within 12 hours or as directed by MD   LORAZEPAM (ATIVAN) 0.5 MG TABLET    Take one-half tablet by mouth every 12 hours as needed for agitation   MAGNESIUM HYDROXIDE (MILK OF MAGNESIA PO)    Give 30 ml by mouth every 24 hours as needed for bowel management   MEMANTINE (NAMENDA XR) 14 MG CP24 24 HR CAPSULE    Give 14 mg by mouth in the afternoon for dementia.   METOPROLOL SUCCINATE (TOPROL-XL) 50 MG 24 HR TABLET    Take 1 tablet (50 mg total) by mouth daily. Take with or immediately following a meal.   RISPERIDONE (RISPERDAL) 0.5 MG TABLET    Take 0.5 mg by mouth at bedtime.   UNABLE  TO FIND    Med Pass - take 120 cc by mouth Two times daily  Modified Medications   No medications on file  Discontinued Medications   No medications on file     SIGNIFICANT DIAGNOSTIC EXAMS  02-17-16: ct of head: 1. No acute intracranial findings. 2. Marked atrophy and moderate white matter microvascular disease.   02-18-16: mri/mra of head: 1.  No acute intracranial abnormality. 2. Chronic posterior right MCA infarct with superimposed bilateral white matter changes most suggestive of chronic small vessel disease. 3.  Negative intracranial MRA aside from mild arterial tortuosity. 4. Focal hyperostosis of the right frontal bone versus small right frontal meningioma, appears inconsequential.  02-18-16: carotid ultrasound: Bilateral: intimal wall thickening CCA. Mild soft plaque origin ICA. 1-39% ICA plaquing. Vertebral artery flow is antegrade.  02-19-16: ct of chest: Filling defects in multiple bilateral segmental and subsegmental pulmonary arteries consistent with pulmonary embolus. Clot burden slow. Diffuse cardiac enlargement likely represents pre-existing heart failure. Small pericardial  effusion. Left thyroid gland nodule. Dilated pancreatic duct with possible low-attenuation lesion in the pancreatic tail.   02-19-16: 2-d echo: Left ventricle: The cavity size was normal. Systolic function was normal. The estimated ejection fraction was in the range of 55% to 60%. Wall motion was normal; there were no regional wall motion abnormalities. Doppler parameters are consistent with abnormal left ventricular relaxation (grade 1 diastolic dysfunction). - Tricuspid valve: There was moderate regurgitation directed centrally. - Pulmonary arteries: PA peak pressure: 33 mm Hg (S). - Pericardium, extracardiac: A small to moderate, free-flowing pericardial effusion was identified circumferential to the heart. There was no evidence of hemodynamic compromise.  02-20-16: ct of abdomen and pelvis: 1. 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 Mild main pancreatic duct dilation. A follow-up pancreas protocol MRI (preferred if the patient can breath-hold) or CT abdomen without and with IV contrast is recommended in 12 months. This recommendation follows ACR consensus guidelines: Managing Incidental Findings on Abdominal CT: White Paper of the ACR Incidental Findings Committee. J Am Coll Radiol 2010;7:754-773. 2. Small moderate pericardial effusion/thickening. 3. Cholelithiasis.  No biliary ductal dilatation. 4. Moderate sigmoid diverticulosis. 5. Aortic atherosclerosis. 6. Myomatous uterus.   02-21-16: pelvic and bilateral hip x-ray: No evidence of bilateral hip fracture. Moderate osteoarthritic changes of bilateral hips.    LABS REVIEWED:   02-17-16: wbc 7.0; hgb 11.9; hct 36.1; mcv 88.5; plt 157; glucose 93; bun 14; creat 1.19; k+ 3.5; na++ 138; liver normal albumin 3.5; urine culture: multiple species 02-18-16; hgb a1c 5.6; chol 214;  ldl 142; trig 90; hdl 54; vit B 12: 353; ammonia 22; tsh 1.211; RPR: nr; HIV: nr 02-20-16; wbc 9.2; hgb 13.2; hct 39.9; mcv 87.9; plt 172; glucose 100; bun 8; creat 0.94; k+ 4.0; na++ 140; mag 1.6  04-14-16; wbc 8.2; hgb 12.7; hct 41.2; mcv 88.2; plt 244; glucose 106; bun 13.1; create 0.92; k+ 3.8; na++ 146; liver normal albumin 3.8 mag 2.1    10-17-16; wbc 5.2; hgb 14.0; hct 44.3; mcv 89.1; plt 217; glucose 126; bun 21.6; creat 1.03; k+ 4.0; na++ 145; liver normal albumin 4.1; chol 193; ldl 116; trig 75; hdl 62; hgb a1c 6.3     Review of Systems  Constitutional: Negative for malaise/fatigue.  Respiratory: Negative for cough and shortness of breath.   Cardiovascular: Negative for chest pain, palpitations and leg swelling.  Gastrointestinal: Negative for abdominal pain,  constipation and heartburn.  Musculoskeletal: Negative for back pain, joint pain and myalgias.  Skin: Negative.   Neurological: Negative for dizziness.  Psychiatric/Behavioral: The patient is not nervous/anxious.      Physical Exam  Constitutional: No distress.  Frail   Eyes: Conjunctivae are normal.  Neck: Neck supple. No JVD present. No thyromegaly present.  Cardiovascular: Normal rate, regular rhythm and intact distal pulses.   Respiratory: Effort normal and breath sounds normal. No respiratory distress. She has no wheezes.  GI: Soft. Bowel sounds are normal. She exhibits no distension. There is no tenderness.  Musculoskeletal: She exhibits no edema.  Able to move all extremities Has contracture of neck    Lymphadenopathy:    She has no cervical adenopathy.  Neurological: She is alert.  Skin: Skin is warm and dry. She is not diaphoretic.  Psychiatric: She has a normal mood and affect.      ASSESSMENT/ PLAN:  1. Bilateral pulmonary emboli: is stable at this time; will continue eliquis 2. 5 mg twice daily will monitor  2. Alzheimer's disease; her current weight is 127 pounds; will continue aricept 10 mg  nightly  3. Dyslipidemia:  daily ldl 116 pravachol has been stopped   4. Hypertension:b/p 131/72  will continue toprol xl 50 mg daily  5. Diastolic heart failure: has a history of pericardial effusion will continue toprol xl 50 mg daily  6. COPD: is presently without change will continue to monitor  7. Thyroid nodule: tsh is 1.211  8. CKD stage III: bun/creat  21.6/1.03  9. Anemia: hgb is 14.0  10.  Psychosis: will continue risperdal 0.5 mg nightly  takes buspar 7.5 mg nightly for anxiety   11. CVA: is neurologically stable; will continue eliquis 2.5 mg twice daily     Synthia Innocent NP [facility]  Contact [phone] Monday through Friday 8am- 5pm  After hours call [phone]

## 2016-12-13 DIAGNOSIS — G9349 Other encephalopathy: Secondary | ICD-10-CM | POA: Diagnosis not present

## 2016-12-13 DIAGNOSIS — R1311 Dysphagia, oral phase: Secondary | ICD-10-CM | POA: Diagnosis not present

## 2016-12-13 DIAGNOSIS — G4700 Insomnia, unspecified: Secondary | ICD-10-CM | POA: Diagnosis not present

## 2016-12-13 DIAGNOSIS — F419 Anxiety disorder, unspecified: Secondary | ICD-10-CM | POA: Diagnosis not present

## 2016-12-13 DIAGNOSIS — F209 Schizophrenia, unspecified: Secondary | ICD-10-CM | POA: Diagnosis not present

## 2016-12-13 DIAGNOSIS — F0390 Unspecified dementia without behavioral disturbance: Secondary | ICD-10-CM | POA: Diagnosis not present

## 2016-12-13 DIAGNOSIS — R1310 Dysphagia, unspecified: Secondary | ICD-10-CM | POA: Diagnosis not present

## 2016-12-14 DIAGNOSIS — R1311 Dysphagia, oral phase: Secondary | ICD-10-CM | POA: Diagnosis not present

## 2016-12-14 DIAGNOSIS — G9349 Other encephalopathy: Secondary | ICD-10-CM | POA: Diagnosis not present

## 2016-12-14 DIAGNOSIS — R1310 Dysphagia, unspecified: Secondary | ICD-10-CM | POA: Diagnosis not present

## 2016-12-17 DIAGNOSIS — G9349 Other encephalopathy: Secondary | ICD-10-CM | POA: Diagnosis not present

## 2016-12-17 DIAGNOSIS — R1310 Dysphagia, unspecified: Secondary | ICD-10-CM | POA: Diagnosis not present

## 2016-12-17 DIAGNOSIS — R1311 Dysphagia, oral phase: Secondary | ICD-10-CM | POA: Diagnosis not present

## 2016-12-18 DIAGNOSIS — R1310 Dysphagia, unspecified: Secondary | ICD-10-CM | POA: Diagnosis not present

## 2016-12-18 DIAGNOSIS — R1311 Dysphagia, oral phase: Secondary | ICD-10-CM | POA: Diagnosis not present

## 2016-12-18 DIAGNOSIS — G9349 Other encephalopathy: Secondary | ICD-10-CM | POA: Diagnosis not present

## 2016-12-19 DIAGNOSIS — R1310 Dysphagia, unspecified: Secondary | ICD-10-CM | POA: Diagnosis not present

## 2016-12-19 DIAGNOSIS — G9349 Other encephalopathy: Secondary | ICD-10-CM | POA: Diagnosis not present

## 2016-12-19 DIAGNOSIS — R1311 Dysphagia, oral phase: Secondary | ICD-10-CM | POA: Diagnosis not present

## 2016-12-20 DIAGNOSIS — R1310 Dysphagia, unspecified: Secondary | ICD-10-CM | POA: Diagnosis not present

## 2016-12-20 DIAGNOSIS — G9349 Other encephalopathy: Secondary | ICD-10-CM | POA: Diagnosis not present

## 2016-12-20 DIAGNOSIS — R1311 Dysphagia, oral phase: Secondary | ICD-10-CM | POA: Diagnosis not present

## 2016-12-21 DIAGNOSIS — G9349 Other encephalopathy: Secondary | ICD-10-CM | POA: Diagnosis not present

## 2016-12-21 DIAGNOSIS — R1311 Dysphagia, oral phase: Secondary | ICD-10-CM | POA: Diagnosis not present

## 2016-12-21 DIAGNOSIS — R1310 Dysphagia, unspecified: Secondary | ICD-10-CM | POA: Diagnosis not present

## 2016-12-24 DIAGNOSIS — G9349 Other encephalopathy: Secondary | ICD-10-CM | POA: Diagnosis not present

## 2016-12-24 DIAGNOSIS — R1311 Dysphagia, oral phase: Secondary | ICD-10-CM | POA: Diagnosis not present

## 2016-12-24 DIAGNOSIS — R1310 Dysphagia, unspecified: Secondary | ICD-10-CM | POA: Diagnosis not present

## 2016-12-25 DIAGNOSIS — R1311 Dysphagia, oral phase: Secondary | ICD-10-CM | POA: Diagnosis not present

## 2016-12-25 DIAGNOSIS — R1310 Dysphagia, unspecified: Secondary | ICD-10-CM | POA: Diagnosis not present

## 2016-12-25 DIAGNOSIS — G9349 Other encephalopathy: Secondary | ICD-10-CM | POA: Diagnosis not present

## 2016-12-26 DIAGNOSIS — R1311 Dysphagia, oral phase: Secondary | ICD-10-CM | POA: Diagnosis not present

## 2016-12-26 DIAGNOSIS — R1310 Dysphagia, unspecified: Secondary | ICD-10-CM | POA: Diagnosis not present

## 2016-12-26 DIAGNOSIS — G9349 Other encephalopathy: Secondary | ICD-10-CM | POA: Diagnosis not present

## 2017-01-11 ENCOUNTER — Encounter: Payer: Self-pay | Admitting: Adult Health

## 2017-01-11 ENCOUNTER — Non-Acute Institutional Stay (SKILLED_NURSING_FACILITY): Payer: Medicare Other | Admitting: Adult Health

## 2017-01-11 DIAGNOSIS — Z8673 Personal history of transient ischemic attack (TIA), and cerebral infarction without residual deficits: Secondary | ICD-10-CM

## 2017-01-11 DIAGNOSIS — N183 Chronic kidney disease, stage 3 unspecified: Secondary | ICD-10-CM

## 2017-01-11 DIAGNOSIS — I2782 Chronic pulmonary embolism: Secondary | ICD-10-CM | POA: Diagnosis not present

## 2017-01-11 DIAGNOSIS — I6930 Unspecified sequelae of cerebral infarction: Secondary | ICD-10-CM

## 2017-01-11 DIAGNOSIS — I5032 Chronic diastolic (congestive) heart failure: Secondary | ICD-10-CM

## 2017-01-11 DIAGNOSIS — D649 Anemia, unspecified: Secondary | ICD-10-CM | POA: Diagnosis not present

## 2017-01-11 DIAGNOSIS — J438 Other emphysema: Secondary | ICD-10-CM

## 2017-01-11 DIAGNOSIS — I10 Essential (primary) hypertension: Secondary | ICD-10-CM | POA: Diagnosis not present

## 2017-01-11 DIAGNOSIS — E041 Nontoxic single thyroid nodule: Secondary | ICD-10-CM

## 2017-01-11 NOTE — Progress Notes (Signed)
Location:   [facility] Room Number: 214B Place of Service:  SNF (31)   CODE STATUS: DNR/MOST  No Known Allergies  Chief Complaint  Patient presents with  . Medical Management of Chronic Issues    Routine visit    HPI:  She is an 83 year old female a long term resident of this facility being seen for the management of her chronic illnesses. Overall her status is without change. She tells me that she is feeling good and has no complaints. There are no nursing concerns at this time.    Past Medical History:  Diagnosis Date  . COPD (chronic obstructive pulmonary disease) (HCC)   . Dementia   . Depression   . Dysphagia   . Hypertension   . Scoliosis     Past Surgical History:  Procedure Laterality Date  . ABDOMINAL HYSTERECTOMY    . KNEE SURGERY    . SHOULDER SURGERY    . VITRECTOMY AND CATARACT      Social History   Social History  . Marital status: Single    Spouse name: N/A  . Number of children: N/A  . Years of education: N/A   Occupational History  . Not on file.   Social History Main Topics  . Smoking status: Never Smoker  . Smokeless tobacco: Never Used  . Alcohol use No  . Drug use: No  . Sexual activity: Not on file   Other Topics Concern  . Not on file   Social History Narrative  . No narrative on file   Family History  Problem Relation Age of Onset  . Stroke Neg Hx       VITAL SIGNS BP (!) 154/92   Pulse 90   Temp 97.1 F (36.2 C)   Resp 18   Ht 5\' 3"  (1.6 m)   Wt 131 lb 1.6 oz (59.5 kg)   SpO2 99%   BMI 23.22 kg/m   Patient's Medications  New Prescriptions   No medications on file  Previous Medications   ACETAMINOPHEN (TYLENOL) 325 MG TABLET    Take 2 tablets (650 mg total) by mouth every 6 (six) hours as needed for mild pain, moderate pain, fever or headache.   APIXABAN (ELIQUIS) 2.5 MG TABS TABLET    Take 2.5 mg by mouth 2 (two) times daily.   BUSPIRONE (BUSPAR) 15 MG TABLET    Give 0.5 mg tablet by mouth  daily and 1 tablet by mouth at bedtime   DONEPEZIL (ARICEPT) 10 MG TABLET    Take 1 tablet (10 mg total) by mouth at bedtime.   LIDOCAINE (LIDODERM) 5 %    Place 1 patch onto the skin daily. Apply to lower back.  Remove & Discard patch within 12 hours or as directed by MD   LORAZEPAM (ATIVAN) 0.5 MG TABLET    Take one-half tablet by mouth every 12 hours as needed for agitation   MAGNESIUM HYDROXIDE (MILK OF MAGNESIA PO)    Give 30 ml by mouth every 24 hours as needed for bowel management   MEMANTINE (NAMENDA XR) 14 MG CP24 24 HR CAPSULE    Give 14 mg by mouth in the afternoon for dementia.   METOPROLOL SUCCINATE (TOPROL-XL) 50 MG 24 HR TABLET    Take 1 tablet (50 mg total) by mouth daily. Take with or immediately following a meal.   RISPERIDONE (RISPERDAL) 0.5 MG TABLET    Take 0.5 mg by mouth at bedtime.   UNABLE TO FIND  Med Pass - take 120 cc by mouth Two times daily  Modified Medications   No medications on file  Discontinued Medications   No medications on file     SIGNIFICANT DIAGNOSTIC EXAMS   02-17-16: ct of head: 1. No acute intracranial findings. 2. Marked atrophy and moderate white matter microvascular disease.   02-18-16: mri/mra of head: 1.  No acute intracranial abnormality. 2. Chronic posterior right MCA infarct with superimposed bilateral white matter changes most suggestive of chronic small vessel disease. 3.  Negative intracranial MRA aside from mild arterial tortuosity. 4. Focal hyperostosis of the right frontal bone versus small right frontal meningioma, appears inconsequential.  02-18-16: carotid ultrasound: Bilateral: intimal wall thickening CCA. Mild soft plaque origin ICA. 1-39% ICA plaquing. Vertebral artery flow is antegrade.  02-19-16: ct of chest: Filling defects in multiple bilateral segmental and subsegmental pulmonary arteries consistent with pulmonary embolus. Clot burden slow. Diffuse cardiac enlargement likely represents pre-existing heart failure. Small  pericardial effusion. Left thyroid gland nodule. Dilated pancreatic duct with possible low-attenuation lesion in the pancreatic tail.   02-19-16: 2-d echo: Left ventricle: The cavity size was normal. Systolic function was normal. The estimated ejection fraction was in the range of 55% to 60%. Wall motion was normal; there were no regional wall motion abnormalities. Doppler parameters are consistent with abnormal left ventricular relaxation (grade 1 diastolic dysfunction). - Tricuspid valve: There was moderate regurgitation directed centrally. - Pulmonary arteries: PA peak pressure: 33 mm Hg (S). - Pericardium, extracardiac: A small to moderate, free-flowing pericardial effusion was identified circumferential to the heart. There was no evidence of hemodynamic compromise.  02-20-16: ct of abdomen and pelvis: 1. 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 Mild main pancreatic duct dilation. A follow-up pancreas protocol MRI (preferred if the patient can breath-hold) or CT abdomen without and with IV contrast is recommended in 12 months. This recommendation follows ACR consensus guidelines: Managing Incidental Findings on Abdominal CT: White Paper of the ACR Incidental Findings Committee. J Am Coll Radiol 2010;7:754-773. 2. Small moderate pericardial effusion/thickening. 3. Cholelithiasis.  No biliary ductal dilatation. 4. Moderate sigmoid diverticulosis. 5. Aortic atherosclerosis. 6. Myomatous uterus.   02-21-16: pelvic and bilateral hip x-ray: No evidence of bilateral hip fracture. Moderate osteoarthritic changes of bilateral hips.    LABS REVIEWED:   02-17-16: wbc 7.0; hgb 11.9; hct 36.1; mcv 88.5; plt 157; glucose 93; bun 14; creat 1.19; k+ 3.5; na++ 138; liver normal albumin 3.5; urine culture: multiple species 02-18-16; hgb a1c  5.6; chol 214; ldl 142; trig 90; hdl 54; vit B 12: 353; ammonia 22; tsh 1.211; RPR: nr; HIV: nr 02-20-16; wbc 9.2; hgb 13.2; hct 39.9; mcv 87.9; plt 172; glucose 100; bun 8; creat 0.94; k+ 4.0; na++ 140; mag 1.6  04-14-16; wbc 8.2; hgb 12.7; hct 41.2; mcv 88.2; plt 244; glucose 106; bun 13.1; create 0.92; k+ 3.8; na++ 146; liver normal albumin 3.8 mag 2.1    10-17-16; wbc 5.2; hgb 14.0; hct 44.3; mcv 89.1; plt 217; glucose 126; bun 21.6; creat 1.03; k+ 4.0; na++ 145; liver normal albumin 4.1; chol 193; ldl 116; trig 75; hdl 62; hgb a1c 6.3  12-06-16: wbc 7.1; hgb 13.8; hct 43.2 mcv 89.9; plt 219; glucose 123; bun 27.1; creat 1.23; k+ 4.5 ;na++ 144 ca 9.8     Review of Systems  Constitutional: Negative for malaise/fatigue.  Respiratory: Negative for cough and  shortness of breath.   Cardiovascular: Negative for chest pain, palpitations and leg swelling.  Gastrointestinal: Negative for abdominal pain, constipation and heartburn.  Musculoskeletal: Negative for back pain, joint pain and myalgias.  Skin: Negative.   Neurological: Negative for dizziness.  Psychiatric/Behavioral: The patient is not nervous/anxious.      Physical Exam  Constitutional: No distress.  Frail   Eyes: Conjunctivae are normal.  Neck: Neck supple. No JVD present. No thyromegaly present.  Cardiovascular: Normal rate, regular rhythm and intact distal pulses.   Respiratory: Effort normal and breath sounds normal. No respiratory distress. She has no wheezes.  GI: Soft. Bowel sounds are normal. She exhibits no distension. There is no tenderness.  Musculoskeletal: She exhibits no edema.  Able to move all extremities Has contracture of neck    Lymphadenopathy:    She has no cervical adenopathy.  Neurological: She is alert.  Skin: Skin is warm and dry. She is not diaphoretic.  Psychiatric: She has a normal mood and affect.      ASSESSMENT/ PLAN:  1. Bilateral pulmonary emboli: is stable at this time; will continue eliquis  2. 5 mg twice daily will monitor  2. Alzheimer's disease; her current weight is 131 pounds; will continue aricept 10 mg nightly and namenda xr 14 mg daily   3. Dyslipidemia:  daily ldl 116 is off pravachol   4. Hypertension:b/p 152/92  will continue toprol xl 50 mg daily  5. Diastolic heart failure: has a history of pericardial effusion will continue toprol xl 50 mg daily  6. COPD: is presently without change will continue to monitor  7. Thyroid nodule: tsh is 1.211 will continue to monitor   8. CKD stage III: bun/creat  27.1/1.23 will monitor   9. Anemia: hgb is 13.8 will monitor   10.  Psychosis: will continue risperdal 0.5 mg nightly  takes buspar 7.5 mg nightly for anxiety  She also has ativan 0.25 mg twice daily to help with any acute episodes of anxiety; at this time she does not require this medication on a routine basis.   11. CVA: is neurologically stable; will continue eliquis 2.5 mg twice daily   12. Chronic low back pain: will continue lidocaine 4% patch to lower back daily       MD is aware of resident's narcotic use and is in agreement with current plan of care. We will attempt to wean resident as apropriate   Synthia Innocenteborah Green NP [facility]  Contact [phone] Monday through Friday 8am- 5pm  After hours call [phone]

## 2017-02-11 ENCOUNTER — Non-Acute Institutional Stay (SKILLED_NURSING_FACILITY): Payer: Medicare Other | Admitting: Adult Health

## 2017-02-11 ENCOUNTER — Encounter: Payer: Self-pay | Admitting: Adult Health

## 2017-02-11 DIAGNOSIS — I5032 Chronic diastolic (congestive) heart failure: Secondary | ICD-10-CM | POA: Diagnosis not present

## 2017-02-11 DIAGNOSIS — N183 Chronic kidney disease, stage 3 unspecified: Secondary | ICD-10-CM

## 2017-02-11 DIAGNOSIS — G309 Alzheimer's disease, unspecified: Secondary | ICD-10-CM | POA: Diagnosis not present

## 2017-02-11 DIAGNOSIS — I6930 Unspecified sequelae of cerebral infarction: Secondary | ICD-10-CM | POA: Diagnosis not present

## 2017-02-11 DIAGNOSIS — I2782 Chronic pulmonary embolism: Secondary | ICD-10-CM | POA: Diagnosis not present

## 2017-02-11 DIAGNOSIS — F0280 Dementia in other diseases classified elsewhere without behavioral disturbance: Secondary | ICD-10-CM

## 2017-02-11 DIAGNOSIS — I10 Essential (primary) hypertension: Secondary | ICD-10-CM

## 2017-02-11 NOTE — Progress Notes (Signed)
Location:   [facility] Room Number: 214 B Place of Service:  SNF (31)   CODE STATUS: DNR  No Known Allergies  Chief Complaint  Patient presents with  . Medical Management of Chronic Issues    1 month follow up    HPI:  She is a 83 year old long term resident of this facility being seen for the management of her chronic illnesses. She continues to ambulate with a walker. She tells me that she is feeling good. There are no nursing concerns at this time.    Past Medical History:  Diagnosis Date  . COPD (chronic obstructive pulmonary disease) (HCC)   . Dementia   . Depression   . Dysphagia   . Hypertension   . Scoliosis     Past Surgical History:  Procedure Laterality Date  . ABDOMINAL HYSTERECTOMY    . KNEE SURGERY    . SHOULDER SURGERY    . VITRECTOMY AND CATARACT      Social History   Social History  . Marital status: Single    Spouse name: N/A  . Number of children: N/A  . Years of education: N/A   Occupational History  . Not on file.   Social History Main Topics  . Smoking status: Never Smoker  . Smokeless tobacco: Never Used  . Alcohol use No  . Drug use: No  . Sexual activity: Not on file   Other Topics Concern  . Not on file   Social History Narrative  . No narrative on file   Family History  Problem Relation Age of Onset  . Stroke Neg Hx       VITAL SIGNS BP 120/70   Pulse 76   Temp (!) 96.2 F (35.7 C)   Ht 5\' 3"  (1.6 m)   Wt 126 lb 12.8 oz (57.5 kg)   SpO2 97%   BMI 22.46 kg/m   Patient's Medications  New Prescriptions   No medications on file  Previous Medications   ACETAMINOPHEN (TYLENOL) 325 MG TABLET    Take 2 tablets (650 mg total) by mouth every 6 (six) hours as needed for mild pain, moderate pain, fever or headache.   APIXABAN (ELIQUIS) 2.5 MG TABS TABLET    Take 2.5 mg by mouth 2 (two) times daily.   BUSPIRONE (BUSPAR) 15 MG TABLET    Give 1 tablet (15 mg) by mouth daily and 1 tablet by mouth at bedtime    DONEPEZIL (ARICEPT) 10 MG TABLET    Take 1 tablet (10 mg total) by mouth at bedtime.   LIDOCAINE 4 % PTCH    Place 1 patch onto the skin daily. Apply to lower back.  Remove & Discard patch within 12 hours or as directed by MD   MEMANTINE (NAMENDA XR) 28 MG CP24 24 HR CAPSULE    Take 28 mg by mouth daily.   METOPROLOL SUCCINATE (TOPROL-XL) 50 MG 24 HR TABLET    Take 1 tablet (50 mg total) by mouth daily. Take with or immediately following a meal.   RISPERIDONE (RISPERDAL) 0.5 MG TABLET    Take 0.5 mg by mouth at bedtime.  Modified Medications   No medications on file  Discontinued Medications   LORAZEPAM (ATIVAN) 0.5 MG TABLET    Take one-half tablet by mouth every 12 hours as needed for agitation   MAGNESIUM HYDROXIDE (MILK OF MAGNESIA PO)    Give 30 ml by mouth every 24 hours as needed for bowel management  UNABLE TO FIND    Med Pass - take 120 cc by mouth Two times daily     SIGNIFICANT DIAGNOSTIC EXAMS  02-17-16: ct of head: 1. No acute intracranial findings. 2. Marked atrophy and moderate white matter microvascular disease.   02-18-16: mri/mra of head: 1.  No acute intracranial abnormality. 2. Chronic posterior right MCA infarct with superimposed bilateral white matter changes most suggestive of chronic small vessel disease. 3.  Negative intracranial MRA aside from mild arterial tortuosity. 4. Focal hyperostosis of the right frontal bone versus small right frontal meningioma, appears inconsequential.  02-18-16: carotid ultrasound: Bilateral: intimal wall thickening CCA. Mild soft plaque origin ICA. 1-39% ICA plaquing. Vertebral artery flow is antegrade.  02-19-16: ct of chest: Filling defects in multiple bilateral segmental and subsegmental pulmonary arteries consistent with pulmonary embolus. Clot burden slow. Diffuse cardiac enlargement likely represents pre-existing heart failure. Small pericardial effusion. Left thyroid gland nodule. Dilated pancreatic duct with possible low-attenuation  lesion in the pancreatic tail.   02-19-16: 2-d echo: Left ventricle: The cavity size was normal. Systolic function was normal. The estimated ejection fraction was in the range of 55% to 60%. Wall motion was normal; there were no regional wall motion abnormalities. Doppler parameters are consistent with abnormal left ventricular relaxation (grade 1 diastolic dysfunction). - Tricuspid valve: There was moderate regurgitation directed centrally. - Pulmonary arteries: PA peak pressure: 33 mm Hg (S). - Pericardium, extracardiac: A small to moderate, free-flowing pericardial effusion was identified circumferential to the heart. There was no evidence of hemodynamic compromise.  02-20-16: ct of abdomen and pelvis: 1. 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 Mild main pancreatic duct dilation. A follow-up pancreas protocol MRI (preferred if the patient can breath-hold) or CT abdomen without and with IV contrast is recommended in 12 months. This recommendation follows ACR consensus guidelines: Managing Incidental Findings on Abdominal CT: White Paper of the ACR Incidental Findings Committee. J Am Coll Radiol 2010;7:754-773. 2. Small moderate pericardial effusion/thickening. 3. Cholelithiasis.  No biliary ductal dilatation. 4. Moderate sigmoid diverticulosis. 5. Aortic atherosclerosis. 6. Myomatous uterus.   02-21-16: pelvic and bilateral hip x-ray: No evidence of bilateral hip fracture. Moderate osteoarthritic changes of bilateral hips.    LABS REVIEWED:   02-17-16: wbc 7.0; hgb 11.9; hct 36.1; mcv 88.5; plt 157; glucose 93; bun 14; creat 1.19; k+ 3.5; na++ 138; liver normal albumin 3.5; urine culture: multiple species 02-18-16; hgb a1c 5.6; chol 214; ldl 142; trig 90; hdl 54; vit B 12: 353; ammonia 22; tsh 1.211; RPR: nr; HIV: nr 02-20-16;  wbc 9.2; hgb 13.2; hct 39.9; mcv 87.9; plt 172; glucose 100; bun 8; creat 0.94; k+ 4.0; na++ 140; mag 1.6  04-14-16; wbc 8.2; hgb 12.7; hct 41.2; mcv 88.2; plt 244; glucose 106; bun 13.1; create 0.92; k+ 3.8; na++ 146; liver normal albumin 3.8 mag 2.1    10-17-16; wbc 5.2; hgb 14.0; hct 44.3; mcv 89.1; plt 217; glucose 126; bun 21.6; creat 1.03; k+ 4.0; na++ 145; liver normal albumin 4.1; chol 193; ldl 116; trig 75; hdl 62; hgb a1c 6.3  12-06-16: wbc 7.1; hgb 13.8; hct 43.2 mcv 89.9; plt 219; glucose 123; bun 27.1; creat 1.23; k+ 4.5 ;na++ 144 ca 9.8     Review of Systems  Constitutional: Negative for malaise/fatigue.  Respiratory: Negative for cough and shortness of breath.   Cardiovascular: Negative for chest pain, palpitations and leg  swelling.  Gastrointestinal: Negative for abdominal pain, constipation and heartburn.  Musculoskeletal: Negative for back pain, joint pain and myalgias.  Skin: Negative.   Neurological: Negative for dizziness.  Psychiatric/Behavioral: The patient is not nervous/anxious.      Physical Exam  Constitutional: No distress.  Frail   Eyes: Conjunctivae are normal.  Neck: Neck supple. No JVD present. No thyromegaly present.  Cardiovascular: Normal rate, regular rhythm and intact distal pulses.   Respiratory: Effort normal and breath sounds normal. No respiratory distress. She has no wheezes.  GI: Soft. Bowel sounds are normal. She exhibits no distension. There is no tenderness.  Musculoskeletal: She exhibits no edema.  Able to move all extremities Has contracture of neck    Lymphadenopathy:    She has no cervical adenopathy.  Neurological: She is alert.  Skin: Skin is warm and dry. She is not diaphoretic.  Psychiatric: She has a normal mood and affect.      ASSESSMENT/ PLAN:  1. Bilateral pulmonary emboli: is stable at this time; will continue eliquis 2. 5 mg twice daily will monitor  2. Alzheimer's disease; her current weight is 126 pounds; will  continue aricept 10 mg nightly and namenda xr 14 mg daily   3. Dyslipidemia:  daily ldl 116 is off pravachol   4. Hypertension:b/p 120/70  will continue toprol xl 50 mg daily  5. Diastolic heart failure: has a history of pericardial effusion will continue toprol xl 50 mg daily  6. COPD: is presently without change will continue to monitor  7. Thyroid nodule: tsh is 1.211 will continue to monitor   8. CKD stage III: bun/creat  27.1/1.23 will monitor   9. Anemia: hgb is 13.8 will monitor   10.  Psychosis: will continue risperdal 0.5 mg nightly  takes buspar 7.5 mg nightly for anxiety    11. CVA: is neurologically stable; will continue eliquis 2.5 mg twice daily   12. Chronic low back pain: will continue lidocaine 4% patch to lower back daily    MD is aware of resident's narcotic use and is in agreement with current plan of care. We will attempt to wean resident as apropriate    Synthia Innocent NP [facility]  Contact [phone] Monday through Friday 8am- 5pm  After hours call [phone]

## 2017-02-12 ENCOUNTER — Encounter (HOSPITAL_COMMUNITY): Payer: Self-pay | Admitting: Emergency Medicine

## 2017-02-12 ENCOUNTER — Emergency Department (HOSPITAL_COMMUNITY): Payer: Medicare Other

## 2017-02-12 ENCOUNTER — Inpatient Hospital Stay (HOSPITAL_COMMUNITY)
Admission: EM | Admit: 2017-02-12 | Discharge: 2017-02-15 | DRG: 312 | Disposition: A | Discharge status: Skilled Nursing Facility | Payer: Medicare Other | Attending: Family Medicine | Admitting: Family Medicine

## 2017-02-12 DIAGNOSIS — N183 Chronic kidney disease, stage 3 unspecified: Secondary | ICD-10-CM | POA: Diagnosis present

## 2017-02-12 DIAGNOSIS — Z66 Do not resuscitate: Secondary | ICD-10-CM | POA: Diagnosis present

## 2017-02-12 DIAGNOSIS — E042 Nontoxic multinodular goiter: Secondary | ICD-10-CM | POA: Diagnosis present

## 2017-02-12 DIAGNOSIS — R404 Transient alteration of awareness: Secondary | ICD-10-CM | POA: Diagnosis not present

## 2017-02-12 DIAGNOSIS — I5032 Chronic diastolic (congestive) heart failure: Secondary | ICD-10-CM | POA: Diagnosis not present

## 2017-02-12 DIAGNOSIS — D649 Anemia, unspecified: Secondary | ICD-10-CM | POA: Diagnosis present

## 2017-02-12 DIAGNOSIS — G309 Alzheimer's disease, unspecified: Secondary | ICD-10-CM

## 2017-02-12 DIAGNOSIS — R55 Syncope and collapse: Principal | ICD-10-CM

## 2017-02-12 DIAGNOSIS — J449 Chronic obstructive pulmonary disease, unspecified: Secondary | ICD-10-CM | POA: Diagnosis present

## 2017-02-12 DIAGNOSIS — I2699 Other pulmonary embolism without acute cor pulmonale: Secondary | ICD-10-CM | POA: Diagnosis present

## 2017-02-12 DIAGNOSIS — F0391 Unspecified dementia with behavioral disturbance: Secondary | ICD-10-CM | POA: Diagnosis not present

## 2017-02-12 DIAGNOSIS — F0280 Dementia in other diseases classified elsewhere without behavioral disturbance: Secondary | ICD-10-CM | POA: Diagnosis present

## 2017-02-12 DIAGNOSIS — I517 Cardiomegaly: Secondary | ICD-10-CM | POA: Diagnosis not present

## 2017-02-12 DIAGNOSIS — Z993 Dependence on wheelchair: Secondary | ICD-10-CM

## 2017-02-12 DIAGNOSIS — I6930 Unspecified sequelae of cerebral infarction: Secondary | ICD-10-CM | POA: Diagnosis not present

## 2017-02-12 DIAGNOSIS — I130 Hypertensive heart and chronic kidney disease with heart failure and stage 1 through stage 4 chronic kidney disease, or unspecified chronic kidney disease: Secondary | ICD-10-CM | POA: Diagnosis present

## 2017-02-12 DIAGNOSIS — I10 Essential (primary) hypertension: Secondary | ICD-10-CM | POA: Diagnosis present

## 2017-02-12 DIAGNOSIS — E041 Nontoxic single thyroid nodule: Secondary | ICD-10-CM

## 2017-02-12 DIAGNOSIS — Z79899 Other long term (current) drug therapy: Secondary | ICD-10-CM

## 2017-02-12 DIAGNOSIS — I2510 Atherosclerotic heart disease of native coronary artery without angina pectoris: Secondary | ICD-10-CM | POA: Diagnosis present

## 2017-02-12 DIAGNOSIS — Z86711 Personal history of pulmonary embolism: Secondary | ICD-10-CM

## 2017-02-12 DIAGNOSIS — Z90710 Acquired absence of both cervix and uterus: Secondary | ICD-10-CM

## 2017-02-12 LAB — CBC WITH DIFFERENTIAL/PLATELET
Basophils Absolute: 0 10*3/uL (ref 0.0–0.1)
Basophils Relative: 0 %
Eosinophils Absolute: 0.1 10*3/uL (ref 0.0–0.7)
Eosinophils Relative: 1 %
HCT: 36.7 % (ref 36.0–46.0)
Hemoglobin: 11.6 g/dL — ABNORMAL LOW (ref 12.0–15.0)
Lymphocytes Relative: 13 %
Lymphs Abs: 1 10*3/uL (ref 0.7–4.0)
MCH: 28.1 pg (ref 26.0–34.0)
MCHC: 31.6 g/dL (ref 30.0–36.0)
MCV: 88.9 fL (ref 78.0–100.0)
Monocytes Absolute: 0.8 10*3/uL (ref 0.1–1.0)
Monocytes Relative: 12 %
Neutro Abs: 5.4 10*3/uL (ref 1.7–7.7)
Neutrophils Relative %: 74 %
Platelets: 157 10*3/uL (ref 150–400)
RBC: 4.13 MIL/uL (ref 3.87–5.11)
RDW: 14.6 % (ref 11.5–15.5)
WBC: 7.3 10*3/uL (ref 4.0–10.5)

## 2017-02-12 LAB — I-STAT TROPONIN, ED: Troponin i, poc: 0 ng/mL (ref 0.00–0.08)

## 2017-02-12 LAB — D-DIMER, QUANTITATIVE: D-Dimer, Quant: 0.5 ug/mL-FEU (ref 0.00–0.50)

## 2017-02-12 MED ORDER — IOPAMIDOL (ISOVUE-370) INJECTION 76%
INTRAVENOUS | Status: AC
  Filled 2017-02-12: qty 100

## 2017-02-12 NOTE — ED Triage Notes (Signed)
Patient arrived with EMS from [facility] , staff reported brief syncopal episode while sitting on her wheelchair this evening , CBG= 177 , alert /confused at arrival , history of dementia .

## 2017-02-12 NOTE — ED Notes (Signed)
Patient transported to CT 

## 2017-02-13 ENCOUNTER — Emergency Department (HOSPITAL_COMMUNITY): Payer: Medicare Other

## 2017-02-13 ENCOUNTER — Encounter (HOSPITAL_COMMUNITY): Payer: Self-pay | Admitting: Radiology

## 2017-02-13 ENCOUNTER — Observation Stay (HOSPITAL_COMMUNITY): Payer: Medicare Other

## 2017-02-13 DIAGNOSIS — R55 Syncope and collapse: Secondary | ICD-10-CM | POA: Diagnosis not present

## 2017-02-13 DIAGNOSIS — Z993 Dependence on wheelchair: Secondary | ICD-10-CM | POA: Diagnosis not present

## 2017-02-13 DIAGNOSIS — S0990XA Unspecified injury of head, initial encounter: Secondary | ICD-10-CM | POA: Diagnosis not present

## 2017-02-13 DIAGNOSIS — I10 Essential (primary) hypertension: Secondary | ICD-10-CM | POA: Diagnosis not present

## 2017-02-13 DIAGNOSIS — I360 Nonrheumatic tricuspid (valve) stenosis: Secondary | ICD-10-CM | POA: Diagnosis not present

## 2017-02-13 DIAGNOSIS — S59902A Unspecified injury of left elbow, initial encounter: Secondary | ICD-10-CM | POA: Diagnosis not present

## 2017-02-13 DIAGNOSIS — I2510 Atherosclerotic heart disease of native coronary artery without angina pectoris: Secondary | ICD-10-CM | POA: Diagnosis present

## 2017-02-13 DIAGNOSIS — S42122A Displaced fracture of acromial process, left shoulder, initial encounter for closed fracture: Secondary | ICD-10-CM | POA: Diagnosis not present

## 2017-02-13 DIAGNOSIS — M25511 Pain in right shoulder: Secondary | ICD-10-CM | POA: Diagnosis not present

## 2017-02-13 DIAGNOSIS — S0091XA Abrasion of unspecified part of head, initial encounter: Secondary | ICD-10-CM | POA: Diagnosis not present

## 2017-02-13 DIAGNOSIS — I5032 Chronic diastolic (congestive) heart failure: Secondary | ICD-10-CM

## 2017-02-13 DIAGNOSIS — G309 Alzheimer's disease, unspecified: Secondary | ICD-10-CM | POA: Diagnosis not present

## 2017-02-13 DIAGNOSIS — Z8673 Personal history of transient ischemic attack (TIA), and cerebral infarction without residual deficits: Secondary | ICD-10-CM | POA: Diagnosis not present

## 2017-02-13 DIAGNOSIS — J438 Other emphysema: Secondary | ICD-10-CM | POA: Diagnosis not present

## 2017-02-13 DIAGNOSIS — D649 Anemia, unspecified: Secondary | ICD-10-CM | POA: Diagnosis not present

## 2017-02-13 DIAGNOSIS — F0390 Unspecified dementia without behavioral disturbance: Secondary | ICD-10-CM | POA: Diagnosis not present

## 2017-02-13 DIAGNOSIS — S3993XA Unspecified injury of pelvis, initial encounter: Secondary | ICD-10-CM | POA: Diagnosis not present

## 2017-02-13 DIAGNOSIS — Z66 Do not resuscitate: Secondary | ICD-10-CM | POA: Diagnosis present

## 2017-02-13 DIAGNOSIS — I130 Hypertensive heart and chronic kidney disease with heart failure and stage 1 through stage 4 chronic kidney disease, or unspecified chronic kidney disease: Secondary | ICD-10-CM | POA: Diagnosis present

## 2017-02-13 DIAGNOSIS — Y929 Unspecified place or not applicable: Secondary | ICD-10-CM | POA: Diagnosis not present

## 2017-02-13 DIAGNOSIS — F0391 Unspecified dementia with behavioral disturbance: Secondary | ICD-10-CM | POA: Diagnosis present

## 2017-02-13 DIAGNOSIS — E042 Nontoxic multinodular goiter: Secondary | ICD-10-CM | POA: Diagnosis not present

## 2017-02-13 DIAGNOSIS — Z79899 Other long term (current) drug therapy: Secondary | ICD-10-CM | POA: Diagnosis not present

## 2017-02-13 DIAGNOSIS — S098XXA Other specified injuries of head, initial encounter: Secondary | ICD-10-CM | POA: Diagnosis not present

## 2017-02-13 DIAGNOSIS — Z86711 Personal history of pulmonary embolism: Secondary | ICD-10-CM | POA: Diagnosis not present

## 2017-02-13 DIAGNOSIS — S199XXA Unspecified injury of neck, initial encounter: Secondary | ICD-10-CM | POA: Diagnosis not present

## 2017-02-13 DIAGNOSIS — I6930 Unspecified sequelae of cerebral infarction: Secondary | ICD-10-CM | POA: Diagnosis not present

## 2017-02-13 DIAGNOSIS — F0280 Dementia in other diseases classified elsewhere without behavioral disturbance: Secondary | ICD-10-CM | POA: Diagnosis not present

## 2017-02-13 DIAGNOSIS — Z90710 Acquired absence of both cervix and uterus: Secondary | ICD-10-CM | POA: Diagnosis not present

## 2017-02-13 DIAGNOSIS — J449 Chronic obstructive pulmonary disease, unspecified: Secondary | ICD-10-CM | POA: Diagnosis present

## 2017-02-13 DIAGNOSIS — Y939 Activity, unspecified: Secondary | ICD-10-CM | POA: Diagnosis not present

## 2017-02-13 DIAGNOSIS — S42125A Nondisplaced fracture of acromial process, left shoulder, initial encounter for closed fracture: Secondary | ICD-10-CM | POA: Diagnosis not present

## 2017-02-13 DIAGNOSIS — I2782 Chronic pulmonary embolism: Secondary | ICD-10-CM | POA: Diagnosis not present

## 2017-02-13 DIAGNOSIS — N183 Chronic kidney disease, stage 3 (moderate): Secondary | ICD-10-CM | POA: Diagnosis not present

## 2017-02-13 DIAGNOSIS — M25522 Pain in left elbow: Secondary | ICD-10-CM | POA: Diagnosis not present

## 2017-02-13 DIAGNOSIS — Y998 Other external cause status: Secondary | ICD-10-CM | POA: Diagnosis not present

## 2017-02-13 DIAGNOSIS — W19XXXA Unspecified fall, initial encounter: Secondary | ICD-10-CM | POA: Diagnosis not present

## 2017-02-13 LAB — BASIC METABOLIC PANEL
Anion gap: 8 (ref 5–15)
BUN: 17 mg/dL (ref 6–20)
CO2: 25 mmol/L (ref 22–32)
Calcium: 9 mg/dL (ref 8.9–10.3)
Chloride: 110 mmol/L (ref 101–111)
Creatinine, Ser: 1.22 mg/dL — ABNORMAL HIGH (ref 0.44–1.00)
GFR calc Af Amer: 44 mL/min — ABNORMAL LOW (ref >60)
GFR calc non Af Amer: 38 mL/min — ABNORMAL LOW (ref >60)
Glucose, Bld: 101 mg/dL — ABNORMAL HIGH (ref 65–99)
Potassium: 3.7 mmol/L (ref 3.5–5.1)
Sodium: 143 mmol/L (ref 135–145)

## 2017-02-13 LAB — COMPREHENSIVE METABOLIC PANEL
ALT: 11 U/L — ABNORMAL LOW (ref 14–54)
AST: 21 U/L (ref 15–41)
Albumin: 3.3 g/dL — ABNORMAL LOW (ref 3.5–5.0)
Alkaline Phosphatase: 66 U/L (ref 38–126)
Anion gap: 6 (ref 5–15)
BUN: 21 mg/dL — ABNORMAL HIGH (ref 6–20)
CO2: 26 mmol/L (ref 22–32)
Calcium: 9 mg/dL (ref 8.9–10.3)
Chloride: 111 mmol/L (ref 101–111)
Creatinine, Ser: 1.45 mg/dL — ABNORMAL HIGH (ref 0.44–1.00)
GFR calc Af Amer: 36 mL/min — ABNORMAL LOW (ref >60)
GFR calc non Af Amer: 31 mL/min — ABNORMAL LOW (ref >60)
Glucose, Bld: 108 mg/dL — ABNORMAL HIGH (ref 65–99)
Potassium: 3.6 mmol/L (ref 3.5–5.1)
Sodium: 143 mmol/L (ref 135–145)
Total Bilirubin: 0.3 mg/dL (ref 0.3–1.2)
Total Protein: 6.2 g/dL — ABNORMAL LOW (ref 6.5–8.1)

## 2017-02-13 LAB — MAGNESIUM: Magnesium: 2 mg/dL (ref 1.7–2.4)

## 2017-02-13 LAB — GLUCOSE, CAPILLARY: Glucose-Capillary: 96 mg/dL (ref 65–99)

## 2017-02-13 LAB — MRSA PCR SCREENING: MRSA by PCR: NEGATIVE

## 2017-02-13 LAB — BRAIN NATRIURETIC PEPTIDE: B Natriuretic Peptide: 92.7 pg/mL (ref 0.0–100.0)

## 2017-02-13 LAB — TSH: TSH: 1.807 u[IU]/mL (ref 0.350–4.500)

## 2017-02-13 MED ORDER — SODIUM CHLORIDE 0.9 % IV SOLN
INTRAVENOUS | Status: AC
  Administered 2017-02-13: 10:00:00 via INTRAVENOUS
  Administered 2017-02-14: 50 mL/h via INTRAVENOUS

## 2017-02-13 MED ORDER — BUSPIRONE HCL 15 MG PO TABS
7.5000 mg | ORAL_TABLET | Freq: Every day | ORAL | Status: DC
  Administered 2017-02-13 – 2017-02-15 (×3): 7.5 mg via ORAL
  Filled 2017-02-13 (×3): qty 1

## 2017-02-13 MED ORDER — RISPERIDONE 1 MG PO TABS
0.5000 mg | ORAL_TABLET | Freq: Every day | ORAL | Status: DC
  Administered 2017-02-13 – 2017-02-14 (×2): 0.5 mg via ORAL
  Filled 2017-02-13 (×2): qty 1

## 2017-02-13 MED ORDER — SODIUM CHLORIDE 0.9 % IV BOLUS (SEPSIS)
500.0000 mL | Freq: Once | INTRAVENOUS | Status: AC
  Administered 2017-02-13: 500 mL via INTRAVENOUS

## 2017-02-13 MED ORDER — BUSPIRONE HCL 15 MG PO TABS: 7.5000 mg | ORAL_TABLET | Freq: Two times a day (BID) | ORAL | Status: DC

## 2017-02-13 MED ORDER — DONEPEZIL HCL 10 MG PO TABS
10.0000 mg | ORAL_TABLET | Freq: Every day | ORAL | Status: DC
  Administered 2017-02-13 – 2017-02-14 (×2): 10 mg via ORAL
  Filled 2017-02-13 (×2): qty 1

## 2017-02-13 MED ORDER — BUSPIRONE HCL 10 MG PO TABS
15.0000 mg | ORAL_TABLET | Freq: Every day | ORAL | Status: DC
  Administered 2017-02-13 – 2017-02-14 (×2): 15 mg via ORAL
  Filled 2017-02-13 (×2): qty 2

## 2017-02-13 MED ORDER — MEMANTINE HCL ER 28 MG PO CP24
28.0000 mg | ORAL_CAPSULE | Freq: Every day | ORAL | Status: DC
  Administered 2017-02-13 – 2017-02-15 (×3): 28 mg via ORAL
  Filled 2017-02-13 (×3): qty 1

## 2017-02-13 MED ORDER — IOPAMIDOL (ISOVUE-370) INJECTION 76%
INTRAVENOUS | Status: AC
  Administered 2017-02-13: 100 mL
  Filled 2017-02-13: qty 100

## 2017-02-13 MED ORDER — SODIUM CHLORIDE 0.9 % IV SOLN: Freq: Once | INTRAVENOUS | Status: DC

## 2017-02-13 MED ORDER — METOPROLOL SUCCINATE ER 50 MG PO TB24
50.0000 mg | ORAL_TABLET | Freq: Every day | ORAL | Status: DC
  Administered 2017-02-13 – 2017-02-15 (×3): 50 mg via ORAL
  Filled 2017-02-13 (×3): qty 1

## 2017-02-13 MED ORDER — APIXABAN 2.5 MG PO TABS
2.5000 mg | ORAL_TABLET | Freq: Two times a day (BID) | ORAL | Status: DC
  Administered 2017-02-13 – 2017-02-15 (×5): 2.5 mg via ORAL
  Filled 2017-02-13 (×5): qty 1

## 2017-02-13 MED ORDER — SODIUM CHLORIDE 0.9% FLUSH
3.0000 mL | Freq: Two times a day (BID) | INTRAVENOUS | Status: DC
  Administered 2017-02-13 – 2017-02-14 (×2): 3 mL via INTRAVENOUS

## 2017-02-13 NOTE — ED Notes (Signed)
Patient transported to X-ray 

## 2017-02-13 NOTE — ED Provider Notes (Signed)
Patient signed out to me to follow-up on CT angiography. Patient had 2 syncopal episodes at nursing home earlier tonight. There did not appear to be any precursor symptoms other than some mild shortness of breath with the first event. I discussed this with family, she has not had any previous syncopal episodes. No history of any known strokes. No history of seizure. She does have significant dementia at baseline, cannot provide any further information.  CT angiogram did not show evidence of PE or other abnormality. Patient will require hospitalization for further observation secondary to syncope.  [place] Syncope Rule from StatOfficial.co.zaMDCalc.com  on 02/13/2017 ** All calculations should be rechecked by clinician prior to use **  RESULT SUMMARY:     Patient is NOT in the low-risk group for serious outcome.   INPUTS: Congestive heart failure history -> 0 = No Hematocrit <30% -> 0 = No EKG abnormal (EKG changed, or any non-sinus rhythm on EKG or monitoring) -> 0 = No Shortness of breath symptoms -> 1 = Yes Systolic BP <90 mmHg at triage -> 0 = No    Gilda CreasePollina, Christopher J, MD 02/13/17 [phone]

## 2017-02-13 NOTE — Progress Notes (Addendum)
02/12/2017  9:51 PM  02/13/2017 9:10 AM  Laurena Beringorothy Koke was seen and examined.  The H&P by the admitting provider, orders, imaging was reviewed.  Check US thyroid.  Please see new orders.  Will continue to follow.   Maryln Manuel. Johnson, MD [facility]

## 2017-02-13 NOTE — H&P (Signed)
History and Physical  Patient Name: Roberta Griffin     YNW:[id number]    DOB: 1926-05-17    DOA: 02/12/2017 PCP: Patient, No Pcp Per  Patient coming from: [facility]  Chief Complaint: Syncope      HPI: Roberta Griffin is a 83 y.o. female with a past medical history significant for advanced dementia, CKD III, HTN, and COPD who presents with syncope twice today.  Caveat that the patient is unable to provide history due to dementia and daughter was no present for event.  Given absence of a detailed history, workup for her syncopal event is challenging.  Per report, the patient was in her normal health today, exhibiting no outward signs of illness that daughter is aware of (fever, cough, vomiting, confusion, decreased mental awareness) until this evening when she was sitting in her wheelchair, was observed by staff to be short of breath, then weak and dizzy.  Supplemental oxygen was administered, but then she seemed to get weaker and passed out for a few seconds to a minute while sitting in her wheelchair. She reportedly came to, then appeared weak again and passed out a second time, so she was transported to the emergency room.  ED course: -Afebrile, heart rate 69, respirations and pulse is normal, blood pressure 126/55 -Orthostatics normal -Na 143, K 3.6, Cr 1.45 (baseline 1.2), WBC 7.3K, Hgb 11.6 and normocytic and stable at baseline -BNP, troponin, and d-dimer normal -CT head without change from previous -Magnesium normal -Chest x-ray showed possible increased interstitial markings, no overt edema, trace pleural effusion -CTA chest was obtained that showed no edema, no PE, her known thyroid mass -ECG was obtained that showed an old RBBB, no change from previous -TRH were asked to observe in the setting of syncope     ROS: Review of Systems  Unable to perform ROS: Dementia          Past Medical History:  Diagnosis Date  . COPD (chronic obstructive pulmonary disease) (HCC)     . Dementia   . Depression   . Dysphagia   . Hypertension   . Scoliosis     Past Surgical History:  Procedure Laterality Date  . ABDOMINAL HYSTERECTOMY    . KNEE SURGERY    . SHOULDER SURGERY    . VITRECTOMY AND CATARACT      Social History: Patient lives in a SNF.  The patient is wheelchair bound.  Nonsmoker.  No Known Allergies  Family history: No family history of SCD that daughter knows of.  Prior to Admission medications   Medication Sig Start Date End Date Taking? Authorizing Provider  apixaban (ELIQUIS) 2.5 MG TABS tablet Take 2.5 mg by mouth 2 (two) times daily.   Yes [provider]  busPIRone (BUSPAR) 15 MG tablet Give 0.5 mg tablet by mouth daily and 1 tablet by mouth at bedtime   Yes [provider]  donepezil (ARICEPT) 10 MG tablet Take 1 tablet (10 mg total) by mouth at bedtime. 02/09/16  Yes Mesner, Barbara Cower, MD  Lidocaine 4 % PTCH Place 1 patch onto the skin daily. Apply to lower back.  Remove & Discard patch within 12 hours or as directed by MD   Yes [provider]  memantine (NAMENDA XR) 28 MG CP24 24 hr capsule Take 28 mg by mouth daily.   Yes [provider]  metoprolol succinate (TOPROL-XL) 50 MG 24 hr tablet Take 1 tablet (50 mg total) by mouth daily. Take with or immediately following a meal.  02/09/16  Yes Mesner, Barbara CowerJason, MD  risperiDONE (RISPERDAL) 0.5 MG tablet Take 0.5 mg by mouth at bedtime.   Yes [provider]  acetaminophen (TYLENOL) 325 MG tablet Take 2 tablets (650 mg total) by mouth every 6 (six) hours as needed for mild pain, moderate pain, fever or headache. 02/23/16   Hongalgi, Maximino GreenlandAnand D, MD  LORazepam (ATIVAN) 0.5 MG tablet Take one-half tablet by mouth every 12 hours as needed for agitation Patient not taking: Reported on 02/12/2017 12/07/16   Kermit Baloeed, Tiffany L, DO       Physical Exam: BP (!) 141/85   Pulse 66   Temp 97.8 F (36.6 C) (Oral)   Resp 17   SpO2 97%  General appearance: Frail edlerly adult  female, awake and in no acute distress.   Eyes: Anicteric, conjunctiva pink, lids and lashes normal.  Right pupil deformed, left reactive. ENT: No nasal deformity, discharge, epistaxis.  Hearing normal. OP dry without lesions.  Dentures. Neck: No neck masses.  Trachea midline.  No thyromegaly/tenderness. Lymph: No cervical or supraclavicular lymphadenopathy. Skin: Warm and dry.   No suspicious rashes or lesions. Cardiac: RRR, nl S1-S2, no murmurs appreciated.  Capillary refill is brisk.  JVP normal.  Trace pretibial LE edema.  Radial and DP pulses 2+ and symmetric. Respiratory: Normal respiratory rate and rhythm.  CTAB without rales or wheezes. Abdomen: Abdomen soft.  No TTP. No ascites, distension, hepatosplenomegaly.   MSK: No deformities or effusions.  No cyanosis or clubbing. Neuro: Cranial nerves 3-12, except chronic right facial asymmetry. Speech is somewhat slurred at baseline.  Muscle strength globally weak.    Psych: Oriented to self, daughter.  States she is in [place], cannot state year.  Attention somewhat reduced.  Affect normal.  Judgment and insight appear impaired.     Labs on Admission:  I have personally reviewed following labs and imaging studies: CBC:  Recent Labs Lab 02/12/17 2258  WBC 7.3  NEUTROABS 5.4  HGB 11.6*  HCT 36.7  MCV 88.9  PLT 157   Basic Metabolic Panel:  Recent Labs Lab 02/12/17 2258  NA 143  K 3.6  CL 111  CO2 26  GLUCOSE 108*  BUN 21*  CREATININE 1.45*  CALCIUM 9.0  MG 2.0   GFR: Estimated Creatinine Clearance: 21.8 mL/min (A) (by C-G formula based on SCr of 1.45 mg/dL (H)).  Liver Function Tests:  Recent Labs Lab 02/12/17 2258  AST 21  ALT 11*  ALKPHOS 66  BILITOT 0.3  PROT 6.2*  ALBUMIN 3.3*        Radiological Exams on Admission: Personally reviewed CXR shows tiny effusion, no focal opacity; CT head and CTA chest reports reviewed: Dg Chest 2 View  Result Date: 02/13/2017 CLINICAL DATA:  Syncope EXAM: CHEST  2 VIEW  COMPARISON:  Chest CT 02/19/2016 FINDINGS: There is mild cardiomegaly with calcific aortic atherosclerosis. Mildly increased interstitial opacities bilaterally. No pleural effusion or pneumothorax. No focal consolidation. Bilateral shoulder hemiarthroplasty is. IMPRESSION: Cardiomegaly and mild interstitial edema. No focal airspace consolidation. Electronically Signed   By: Deatra RobinsonKevin  Herman M.D.   On: 02/13/2017 00:17   Ct Head Wo Contrast  Result Date: 02/13/2017 CLINICAL DATA:  Syncopal episode EXAM: CT HEAD WITHOUT CONTRAST TECHNIQUE: Contiguous axial images were obtained from the base of the skull through the vertex without intravenous contrast. COMPARISON:  02/17/2016 and CT FINDINGS: Brain: Superficial and central atrophy with moderate degree of periventricular and subcortical white matter small vessel ischemic disease. Chronic lacunar infarct of the  right caudate head. A idiopathic bilateral basal ganglial calcifications. No large vascular territory infarction. No intracranial hemorrhage, midline shift or edema. No intra-axial mass nor extra-axial fluid collections. Vascular: No hyperdense vessel or unexpected calcification. Skull: Probable right frontal calcified meningioma as suggested on prior MRI from 02/18/2016. Sinuses/Orbits: No acute finding. Other: None. IMPRESSION: 1. Chronic small vessel ischemic disease. No acute intracranial abnormality. 2. Small chronic caudate lacunar infarct on the right. 3. Cerebral atrophy. 4. Calcified frontal meningioma. Electronically Signed   By: Tollie Eth M.D.   On: 02/13/2017 00:04   Ct Angio Chest Pe W Or Wo Contrast  Result Date: 02/13/2017 CLINICAL DATA:  Acute onset of syncope.  Initial encounter. EXAM: CT ANGIOGRAPHY CHEST WITH CONTRAST TECHNIQUE: Multidetector CT imaging of the chest was performed using the standard protocol during bolus administration of intravenous contrast. Multiplanar CT image reconstructions and MIPs were obtained to evaluate the  vascular anatomy. CONTRAST:  72 mL of Isovue 370 IV contrast COMPARISON:  Chest CT performed 02/19/2016, and chest radiograph performed 02/12/2017 FINDINGS: Cardiovascular:  There is no evidence of pulmonary embolus. The heart is enlarged. Diffuse coronary artery calcifications are seen. Scattered calcification is noted along the aortic arch and descending thoracic aorta. The great vessels are grossly unremarkable in appearance. Mediastinum/Nodes: A small pericardial effusion is noted. No definite mediastinal lymphadenopathy is seen. Fluid tracks along the subcarinal region. There is a large left thyroid mass, measuring perhaps 4.4 cm in size. No axillary lymphadenopathy is seen. However, the axilla are not well characterized due to metal artifact. Lungs/Pleura: Mild bibasilar atelectasis or scarring is noted. Mild interstitial prominence is noted. Mild scarring is noted at the lung apices. No pleural effusion or pneumothorax is seen. No masses are identified. Upper Abdomen: The visualized portions of the liver and spleen are unremarkable. The visualized portions of the gallbladder, adrenal glands and kidneys are within normal limits. Scattered calcification is noted along the proximal abdominal aorta. Musculoskeletal: No acute osseous abnormalities are identified. Mild degenerative change is noted at the upper thoracic spine. Bilateral shoulder arthroplasties are grossly unremarkable. The visualized musculature is unremarkable in appearance. Review of the MIP images confirms the above findings. IMPRESSION: 1. No evidence of pulmonary embolus. 2. Mild bibasilar atelectasis or scarring noted. Mild interstitial prominence seen. Mild scarring at the lung apices. 3. Cardiomegaly.  Diffuse coronary artery calcifications seen. 4. Small pericardial effusion. 5. Large left thyroid mass, measuring perhaps 4.4 cm in size. This may be increased in size from 2017. Recommend further evaluation with thyroid ultrasound. If patient  is clinically hyperthyroid, consider nuclear medicine thyroid uptake and scan. 6. Scattered aortic atherosclerosis. Electronically Signed   By: Roanna Raider M.D.   On: 02/13/2017 01:36    EKG: Independently reviewed. Rate 67, QTc 487, RBBB old.  Echocardiogram 2017: Report reviewed EF 55% Grade I DD PAP 33 Small pericardial effusion             Assessment/Plan Principal Problem:   Syncope Active Problems:   COPD (chronic obstructive pulmonary disease) (HCC)   Dementia   Essential hypertension   Normocytic anemia   CKD (chronic kidney disease), stage III   Chronic diastolic CHF (congestive heart failure) (HCC)   Chronic pulmonary embolism (HCC)  1. Syncope:  No electrolyte abnormalities, evidence to suggest infection, new focal neurological deficits nor electrocardiographic abnormalities.  No orthostasis. -Repeat echocardiogram to eval effusion -Monitor on tele -Check TSH   2. HFpEF:  Appears euvolemic. -Continue metoprolol  3. History of PE in 2017:  -  Continue apixaban  4. History of CVA:  Mild residual deficits.  5. Dementia with behavioral disturbance:  -Continue Risperdal, donepezil -Continue memantine -Continue lorazepam -Continue Buspar -Continue acetaminophen  6. CKD III: Cr at baseline near 1.2            DVT prophylaxis: Lovenox  Code Status: DO NOT RESUSCITATE  Family Communication: Daughter at bedside, overnight plan discussed, CODE STATUS confirmed.  Disposition Plan: Anticipate OBS overnight, repeat echo and monitor tele. If normal, discharge tomorrow. Consults called: None Admission status: OBS At the point of initial evaluation, it is my clinical opinion that admission for OBSERVATION is reasonable and necessary because the patient's presenting complaints in the context of their chronic conditions represent sufficient risk of deterioration or significant morbidity to constitute reasonable grounds for close observation in  the hospital setting, but that the patient may be medically stable for discharge from the hospital within 24 to 48 hours.    Medical decision making: Patient seen at 3:25 AM on 02/13/2017.  The patient was discussed with Dr. Oletta Cohn.  What exists of the patient's chart was reviewed in depth and summarized above.  Clinical condition: stable.        Alberteen Sam [facility] Pager [phone]

## 2017-02-13 NOTE — ED Provider Notes (Signed)
[facility]-EM[facility]ENCY DEPT Provider Note   CSN: [id number] Arrival date & time: 02/12/17  2151     History   Chief Complaint Chief Complaint  Patient presents with  . Loss of Consciousness    HPI Roberta Griffin is a 83 y.o. female.  HPI   84 yo F with h/o COPD, HTN, CHF, h/o b/l PE here with LOC. Pt reportedly was at her SNF today when she reportedly looked short of breath. Per report from SNF, pt's pulse ox was checked and was low. She then "dropped" her pulse ox and lost consciousness x several seconds. O2 was placed and pt gradually returned back to consciousness, then again lost consciousness. No shaking activity. Pt now back to baseline without oxygen. She is demented and had not voiced any complaints prior to this.  Level 5 caveat invoked as remainder of history, ROS, and physical exam limited due to patient's dementia.   Past Medical History:  Diagnosis Date  . COPD (chronic obstructive pulmonary disease) (HCC)   . Dementia   . Depression   . Dysphagia   . Hypertension   . Scoliosis     Patient Active Problem List   Diagnosis Date Noted  . Syncope 02/13/2017  . Chronic arterial ischemic stroke 12/05/2016  . Chronic pulmonary embolism (HCC) 12/05/2016  . Chronic diastolic CHF (congestive heart failure) (HCC) 03/04/2016  . Insomnia 03/04/2016  . Dysarthria 03/04/2016  . Pericardial effusion 03/04/2016  . Thyroid nodule 03/04/2016  . Pancreatic mass   . HLD (hyperlipidemia)   . Dysphagia 02/18/2016  . COPD (chronic obstructive pulmonary disease) (HCC) 02/17/2016  . Dementia 02/17/2016  . Essential hypertension 02/17/2016  . Normocytic anemia 02/17/2016  . CKD (chronic kidney disease), stage III 02/17/2016    Past Surgical History:  Procedure Laterality Date  . ABDOMINAL HYSTERECTOMY    . KNEE SURGERY    . SHOULDER SURGERY    . VITRECTOMY AND CATARACT      OB History    No data available       Home Medications    Prior to Admission medications     Medication Sig Start Date End Date Taking? Authorizing Provider  apixaban (ELIQUIS) 2.5 MG TABS tablet Take 2.5 mg by mouth 2 (two) times daily.   Yes [provider]  busPIRone (BUSPAR) 15 MG tablet Give 0.5 mg tablet by mouth daily and 1 tablet by mouth at bedtime   Yes [provider]  donepezil (ARICEPT) 10 MG tablet Take 1 tablet (10 mg total) by mouth at bedtime. 02/09/16  Yes Mesner, Barbara Cower, MD  Lidocaine 4 % PTCH Place 1 patch onto the skin daily. Apply to lower back.  Remove & Discard patch within 12 hours or as directed by MD   Yes [provider]  memantine (NAMENDA XR) 28 MG CP24 24 hr capsule Take 28 mg by mouth daily.   Yes [provider]  metoprolol succinate (TOPROL-XL) 50 MG 24 hr tablet Take 1 tablet (50 mg total) by mouth daily. Take with or immediately following a meal. 02/09/16  Yes Mesner, Barbara Cower, MD  risperiDONE (RISPERDAL) 0.5 MG tablet Take 0.5 mg by mouth at bedtime.   Yes [provider]  acetaminophen (TYLENOL) 325 MG tablet Take 2 tablets (650 mg total) by mouth every 6 (six) hours as needed for mild pain, moderate pain, fever or headache. 02/23/16   Hongalgi, Maximino Greenland, MD  LORazepam (ATIVAN) 0.5 MG tablet Take one-half tablet by mouth every 12 hours as needed for  agitation Patient not taking: Reported on 02/12/2017 12/07/16   Kermit Baloeed, Tiffany L, DO    Family History Family History  Problem Relation Age of Onset  . Stroke Neg Hx     Social History Social History  Substance Use Topics  . Smoking status: Never Smoker  . Smokeless tobacco: Never Used  . Alcohol use No     Allergies   Patient has no known allergies.   Review of Systems Review of Systems  Unable to perform ROS: Dementia  Constitutional: Positive for fatigue. Negative for fever.  Neurological: Positive for syncope and weakness.     Physical Exam Updated Vital Signs BP (!) 152/62 (BP Location: Right Arm)   Pulse 65   Temp 97.7 F (36.5 C) (Oral)    Resp 18   Ht 5\' 6"  (1.676 m)   Wt 58.8 kg (129 lb 11.2 oz) Comment: b scale  SpO2 100%   BMI 20.93 kg/m   Physical Exam  Constitutional: She appears well-developed and well-nourished. No distress.  HENT:  Head: Normocephalic and atraumatic.  Eyes: Conjunctivae are normal.  Neck: Neck supple.  Cardiovascular: Normal rate, regular rhythm and normal heart sounds.  Exam reveals no friction rub.   No murmur heard. Pulmonary/Chest: Effort normal and breath sounds normal. No respiratory distress. She has no wheezes. She has no rales.  Abdominal: She exhibits no distension.  Musculoskeletal: She exhibits no edema.  Neurological: She is alert. She exhibits normal muscle tone.  Oriented to person, recognizes family, otherwise confused. MAE with 5/5 strength. Face is symmetric.  Skin: Skin is warm. Capillary refill takes less than 2 seconds.  Psychiatric: She has a normal mood and affect.  Nursing note and vitals reviewed.    ED Treatments / Results  Labs (all labs ordered are listed, but only abnormal results are displayed) Labs Reviewed  CBC WITH DIFFERENTIAL/PLATELET - Abnormal; Notable for the following:       Result Value   Hemoglobin 11.6 (*)    All other components within normal limits  COMPREHENSIVE METABOLIC PANEL - Abnormal; Notable for the following:    Glucose, Bld 108 (*)    BUN 21 (*)    Creatinine, Ser 1.45 (*)    Total Protein 6.2 (*)    Albumin 3.3 (*)    ALT 11 (*)    GFR calc non Af Amer 31 (*)    GFR calc Af Amer 36 (*)    All other components within normal limits  BASIC METABOLIC PANEL - Abnormal; Notable for the following:    Glucose, Bld 101 (*)    Creatinine, Ser 1.22 (*)    GFR calc non Af Amer 38 (*)    GFR calc Af Amer 44 (*)    All other components within normal limits  MRSA PCR SCREENING  BRAIN NATRIURETIC PEPTIDE  D-DIMER, QUANTITATIVE (NOT AT [facility])  MAGNESIUM  TSH  GLUCOSE, CAPILLARY  I-STAT TROPOININ, ED    EKG  EKG  Interpretation  Date/Time:  Tuesday February 12 2017 22:02:20 EDT Ventricular Rate:  67 PR Interval:    QRS Duration: 163 QT Interval:  461 QTC Calculation: 487 R Axis:   -166 Text Interpretation:  Sinus rhythm Atrial premature complexes Probable left atrial enlargement Nonspecific intraventricular conduction delay No significant change since last tracing Confirmed by Shaune PollackIsaacs, Cameron [phone]([id number]) on 02/13/2017 12:03:29 AM Also confirmed by Shaune PollackIsaacs, Cameron [phone]([id number]), editor Madalyn RobEverhart, Marilyn [phone])  on 02/13/2017 8:50:29 AM       Radiology Dg Chest 2 View  Result Date: 02/13/2017 CLINICAL DATA:  Syncope EXAM: CHEST  2 VIEW COMPARISON:  Chest CT 02/19/2016 FINDINGS: There is mild cardiomegaly with calcific aortic atherosclerosis. Mildly increased interstitial opacities bilaterally. No pleural effusion or pneumothorax. No focal consolidation. Bilateral shoulder hemiarthroplasty is. IMPRESSION: Cardiomegaly and mild interstitial edema. No focal airspace consolidation. Electronically Signed   By: Deatra Robinson M.D.   On: 02/13/2017 00:17   Ct Head Wo Contrast  Result Date: 02/13/2017 CLINICAL DATA:  Syncopal episode EXAM: CT HEAD WITHOUT CONTRAST TECHNIQUE: Contiguous axial images were obtained from the base of the skull through the vertex without intravenous contrast. COMPARISON:  02/17/2016 and CT FINDINGS: Brain: Superficial and central atrophy with moderate degree of periventricular and subcortical white matter small vessel ischemic disease. Chronic lacunar infarct of the right caudate head. A idiopathic bilateral basal ganglial calcifications. No large vascular territory infarction. No intracranial hemorrhage, midline shift or edema. No intra-axial mass nor extra-axial fluid collections. Vascular: No hyperdense vessel or unexpected calcification. Skull: Probable right frontal calcified meningioma as suggested on prior MRI from 02/18/2016. Sinuses/Orbits: No acute finding. Other: None. IMPRESSION: 1. Chronic  small vessel ischemic disease. No acute intracranial abnormality. 2. Small chronic caudate lacunar infarct on the right. 3. Cerebral atrophy. 4. Calcified frontal meningioma. Electronically Signed   By: Tollie Eth M.D.   On: 02/13/2017 00:04   Ct Angio Chest Pe W Or Wo Contrast  Result Date: 02/13/2017 CLINICAL DATA:  Acute onset of syncope.  Initial encounter. EXAM: CT ANGIOGRAPHY CHEST WITH CONTRAST TECHNIQUE: Multidetector CT imaging of the chest was performed using the standard protocol during bolus administration of intravenous contrast. Multiplanar CT image reconstructions and MIPs were obtained to evaluate the vascular anatomy. CONTRAST:  72 mL of Isovue 370 IV contrast COMPARISON:  Chest CT performed 02/19/2016, and chest radiograph performed 02/12/2017 FINDINGS: Cardiovascular:  There is no evidence of pulmonary embolus. The heart is enlarged. Diffuse coronary artery calcifications are seen. Scattered calcification is noted along the aortic arch and descending thoracic aorta. The great vessels are grossly unremarkable in appearance. Mediastinum/Nodes: A small pericardial effusion is noted. No definite mediastinal lymphadenopathy is seen. Fluid tracks along the subcarinal region. There is a large left thyroid mass, measuring perhaps 4.4 cm in size. No axillary lymphadenopathy is seen. However, the axilla are not well characterized due to metal artifact. Lungs/Pleura: Mild bibasilar atelectasis or scarring is noted. Mild interstitial prominence is noted. Mild scarring is noted at the lung apices. No pleural effusion or pneumothorax is seen. No masses are identified. Upper Abdomen: The visualized portions of the liver and spleen are unremarkable. The visualized portions of the gallbladder, adrenal glands and kidneys are within normal limits. Scattered calcification is noted along the proximal abdominal aorta. Musculoskeletal: No acute osseous abnormalities are identified. Mild degenerative change is noted  at the upper thoracic spine. Bilateral shoulder arthroplasties are grossly unremarkable. The visualized musculature is unremarkable in appearance. Review of the MIP images confirms the above findings. IMPRESSION: 1. No evidence of pulmonary embolus. 2. Mild bibasilar atelectasis or scarring noted. Mild interstitial prominence seen. Mild scarring at the lung apices. 3. Cardiomegaly.  Diffuse coronary artery calcifications seen. 4. Small pericardial effusion. 5. Large left thyroid mass, measuring perhaps 4.4 cm in size. This may be increased in size from 2017. Recommend further evaluation with thyroid ultrasound. If patient is clinically hyperthyroid, consider nuclear medicine thyroid uptake and scan. 6. Scattered aortic atherosclerosis. Electronically Signed   By: Roanna Raider M.D.   On: 02/13/2017 01:36  Procedures Procedures (including critical care time)  Medications Ordered in ED Medications  iopamidol (ISOVUE-370) 76 % injection (not administered)  memantine (NAMENDA XR) 24 hr capsule 28 mg (28 mg Oral Given 02/13/17 0936)  apixaban (ELIQUIS) tablet 2.5 mg (2.5 mg Oral Given 02/13/17 0936)  risperiDONE (RISPERDAL) tablet 0.5 mg (not administered)  donepezil (ARICEPT) tablet 10 mg (not administered)  metoprolol succinate (TOPROL-XL) 24 hr tablet 50 mg (50 mg Oral Given 02/13/17 0937)  sodium chloride flush (NS) 0.9 % injection 3 mL (3 mLs Intravenous Given 02/13/17 1029)  busPIRone (BUSPAR) tablet 7.5 mg (7.5 mg Oral Given 02/13/17 0936)  busPIRone (BUSPAR) tablet 15 mg (not administered)  0.9 %  sodium chloride infusion ( Intravenous New Bag/Given 02/13/17 1017)  sodium chloride 0.9 % bolus 500 mL (0 mLs Intravenous Stopped 02/13/17 1028)  iopamidol (ISOVUE-370) 76 % injection (100 mLs  Contrast Given 02/13/17 0113)     Initial Impression / Assessment and Plan / ED Course  I have reviewed the triage vital signs and the nursing notes.  Pertinent labs & imaging results that were available during my  care of the patient were reviewed by me and considered in my medical decision making (see chart for details).     83 yo F with extensive PMHx including CHF, PE, dementia here with LOC and reported hypoxia at SNF while sitting in wheelchair. Now back to mental baseline. No signs of CVA. VSS at this time with no further hypoxia. Etiology unclear. Pt does have occasional PVC/pACs so arrhythmia is a consideration, as well as PE given her history. Labs also c/w mild dehydration though no position changes to suggest orthostasis. Pt given IVF, will check CT Angio and likely Obs for high risk syncope obs. Family updated and in agreement.  Final Clinical Impressions(s) / ED Diagnoses   Final diagnoses:  Syncope    New Prescriptions Current Discharge Medication List       Shaune Pollack, MD 02/13/17 [phone]

## 2017-02-13 NOTE — ED Notes (Signed)
Admitting at bedside 

## 2017-02-13 NOTE — Progress Notes (Signed)
Pt admitted from the ED per stretcher assisted by the ED RN. Alert oriented to self,disoriented to time,place and situation. Place on telemetry box 29. Educated pt the fall prevention safety plan. Pt unable to provide any  information, admission history not done. Will continue to monitor pt.   02/13/17 0417  Vitals  Temp 98.5 F (36.9 C)  Temp Source Oral  BP (!) 161/72  BP Location Left Arm  BP Method Automatic  Patient Position (if appropriate) Lying  Pulse Rate 72  Pulse Rate Source Dinamap  Resp 16  Oxygen Therapy  SpO2 96 %  O2 Device Room Air  Pain Assessment  Pain Assessment No/denies pain  Height and Weight  Height 5\' 6"  (1.676 m)  Weight 58.8 kg (129 lb 11.2 oz) (b scale)  Type of Scale Used Standing  Type of Weight Actual  BSA (Calculated - sq m) 1.66 sq meters  BMI (Calculated) 21  Weight in (lb) to have BMI = 25 154.6

## 2017-02-14 ENCOUNTER — Inpatient Hospital Stay (HOSPITAL_COMMUNITY): Payer: Medicare Other

## 2017-02-14 DIAGNOSIS — G309 Alzheimer's disease, unspecified: Secondary | ICD-10-CM

## 2017-02-14 DIAGNOSIS — I360 Nonrheumatic tricuspid (valve) stenosis: Secondary | ICD-10-CM

## 2017-02-14 DIAGNOSIS — I2782 Chronic pulmonary embolism: Secondary | ICD-10-CM

## 2017-02-14 DIAGNOSIS — N183 Chronic kidney disease, stage 3 (moderate): Secondary | ICD-10-CM

## 2017-02-14 DIAGNOSIS — R55 Syncope and collapse: Principal | ICD-10-CM

## 2017-02-14 DIAGNOSIS — J438 Other emphysema: Secondary | ICD-10-CM

## 2017-02-14 DIAGNOSIS — I10 Essential (primary) hypertension: Secondary | ICD-10-CM

## 2017-02-14 DIAGNOSIS — F0280 Dementia in other diseases classified elsewhere without behavioral disturbance: Secondary | ICD-10-CM

## 2017-02-14 LAB — CBC WITH DIFFERENTIAL/PLATELET
Basophils Absolute: 0 10*3/uL (ref 0.0–0.1)
Basophils Relative: 0 %
Eosinophils Absolute: 0.1 10*3/uL (ref 0.0–0.7)
Eosinophils Relative: 2 %
HCT: 38.5 % (ref 36.0–46.0)
Hemoglobin: 12.4 g/dL (ref 12.0–15.0)
Lymphocytes Relative: 24 %
Lymphs Abs: 1.4 10*3/uL (ref 0.7–4.0)
MCH: 28.2 pg (ref 26.0–34.0)
MCHC: 32.2 g/dL (ref 30.0–36.0)
MCV: 87.7 fL (ref 78.0–100.0)
Monocytes Absolute: 0.7 10*3/uL (ref 0.1–1.0)
Monocytes Relative: 11 %
Neutro Abs: 3.7 10*3/uL (ref 1.7–7.7)
Neutrophils Relative %: 63 %
Platelets: 165 10*3/uL (ref 150–400)
RBC: 4.39 MIL/uL (ref 3.87–5.11)
RDW: 14.5 % (ref 11.5–15.5)
WBC: 5.9 10*3/uL (ref 4.0–10.5)

## 2017-02-14 LAB — BASIC METABOLIC PANEL
Anion gap: 6 (ref 5–15)
BUN: 15 mg/dL (ref 6–20)
CO2: 23 mmol/L (ref 22–32)
Calcium: 8.8 mg/dL — ABNORMAL LOW (ref 8.9–10.3)
Chloride: 113 mmol/L — ABNORMAL HIGH (ref 101–111)
Creatinine, Ser: 1.01 mg/dL — ABNORMAL HIGH (ref 0.44–1.00)
GFR calc Af Amer: 55 mL/min — ABNORMAL LOW (ref >60)
GFR calc non Af Amer: 48 mL/min — ABNORMAL LOW (ref >60)
Glucose, Bld: 99 mg/dL (ref 65–99)
Potassium: 3.7 mmol/L (ref 3.5–5.1)
Sodium: 142 mmol/L (ref 135–145)

## 2017-02-14 LAB — GLUCOSE, CAPILLARY: Glucose-Capillary: 95 mg/dL (ref 65–99)

## 2017-02-14 NOTE — NC FL2 (Signed)
[place] MEDICAID FL2 LEVEL OF CARE SCREENING TOOL     IDENTIFICATION  Patient Name: Roberta Griffin Birthdate: March 11, 1926 Sex: female Admission Date (Current Location): 02/12/2017  [facility] and [place] Number:  Producer, television/film/video and Address:  [facility], [address], [place], [place] [address]      Provider Number: [id number]  Attending Physician Name and Address:  Cleora Fleet, MD  Relative Name and Phone Number:       Current Level of Care: Hospital Recommended Level of Care: Skilled Nursing Facility Prior Approval Number:    Date Approved/Denied:   PASRR Number: [id number]  Discharge Plan: SNF    Current Diagnoses: Patient Active Problem List   Diagnosis Date Noted  . Syncope 02/13/2017  . Chronic arterial ischemic stroke 12/05/2016  . Chronic pulmonary embolism (HCC) 12/05/2016  . Chronic diastolic CHF (congestive heart failure) (HCC) 03/04/2016  . Insomnia 03/04/2016  . Dysarthria 03/04/2016  . Pericardial effusion 03/04/2016  . Thyroid nodule 03/04/2016  . Pancreatic mass   . HLD (hyperlipidemia)   . Dysphagia 02/18/2016  . COPD (chronic obstructive pulmonary disease) (HCC) 02/17/2016  . Dementia 02/17/2016  . Essential hypertension 02/17/2016  . Normocytic anemia 02/17/2016  . CKD (chronic kidney disease), stage III 02/17/2016    Orientation RESPIRATION BLADDER Height & Weight     Self  Normal Incontinent Weight: 126 lb (57.2 kg) Height:  5\' 6"  (167.6 cm)  BEHAVIORAL SYMPTOMS/MOOD NEUROLOGICAL BOWEL NUTRITION STATUS   (None)  (Dementia) Continent Diet (Heart healthy)  AMBULATORY STATUS COMMUNICATION OF NEEDS Skin     Verbally Normal                       Personal Care Assistance Level of Assistance              Functional Limitations Info  Sight, Hearing, Speech Sight Info: Adequate Hearing Info: Adequate Speech Info: Adequate    SPECIAL CARE FACTORS FREQUENCY  Blood pressure                     Contractures Contractures Info: Not present    Additional Factors Info  Code Status, Allergies Code Status Info: DNR Allergies Info: NKDA           Current Medications (02/14/2017):  This is the current hospital active medication list Current Facility-Administered Medications  Medication Dose Route Frequency Provider Last Rate Last Dose  . apixaban (ELIQUIS) tablet 2.5 mg  2.5 mg Oral BID Alberteen Sam, MD   2.5 mg at 02/14/17 0935  . busPIRone (BUSPAR) tablet 15 mg  15 mg Oral QHS Alberteen Sam, MD   15 mg at 02/13/17 2321  . busPIRone (BUSPAR) tablet 7.5 mg  7.5 mg Oral Daily Danford, Earl Lites, MD   7.5 mg at 02/14/17 0935  . donepezil (ARICEPT) tablet 10 mg  10 mg Oral QHS Alberteen Sam, MD   10 mg at 02/13/17 2322  . memantine (NAMENDA XR) 24 hr capsule 28 mg  28 mg Oral Daily Alberteen Sam, MD   28 mg at 02/14/17 0935  . metoprolol succinate (TOPROL-XL) 24 hr tablet 50 mg  50 mg Oral Daily Danford, Earl Lites, MD   50 mg at 02/14/17 0935  . risperiDONE (RISPERDAL) tablet 0.5 mg  0.5 mg Oral QHS Danford, Earl Lites, MD   0.5 mg at 02/13/17 2320  . sodium chloride flush (NS) 0.9 % injection 3 mL  3 mL Intravenous Q12H Danford, Earl Liteshristopher P, MD   3 mL at 02/13/17 1029     Discharge Medications: Please see discharge summary for a list of discharge medications.  Relevant Imaging Results:  Relevant Lab Results:   Additional Information SS#: [id number]  Margarito LinerSarah C Boswell, LCSW

## 2017-02-14 NOTE — Progress Notes (Signed)
  Echocardiogram 2D Echocardiogram has been performed.  Batzaya  [place] 02/14/2017, 5:32 PM

## 2017-02-14 NOTE — Clinical Social Work Note (Signed)
Clinical Social Work Assessment  Patient Details  Name: Roberta Griffin MRN: [id number] Date of Birth: 06/05/1926  Date of referral:  02/14/17               Reason for consult:  Discharge Planning                Permission sought to share information with:  Facility Medical sales representativeContact Representative, Family Supports Permission granted to share information::  Yes, Verbal Permission Granted  Name::     Abbott PaoSheila Anderson  Agency::  [facility]  Relationship::  Daughter  Contact Information:  [phone]  Housing/Transportation Living arrangements for the past 2 months:  Skilled Nursing Facility Source of Information:  Medical Team, Adult Children Patient Interpreter Needed:  None Criminal Activity/Legal Involvement Pertinent to Current Situation/Hospitalization:  No - Comment as needed Significant Relationships:  Adult Children, Other Family Members Lives with:  Facility Resident Do you feel safe going back to the place where you live?  Yes Need for family participation in patient care:  Yes (Comment)  Care giving concerns:  Patient is a [facility].   Social Worker assessment / plan:  Patient oriented to self only. No supports at bedside. Patient's daughter returned CSW's voicemail. CSW introduced role and explained that discharge planning would be discussed. Patient's daughter confirmed that [facility] and the plan is for her to return once stable for discharge. Patient's daughter notified that this might occur today. Patient will need PTAR. No further concerns. CSW encouraged patient's daughter to contact CSW as needed. CSW will continue to follow patient and her daughter for support and facilitate discharge to SNF once medically stable.  Employment status:  Retired Health and safety inspectornsurance information:  Medicare PT Recommendations:  Not assessed at this time Information / Referral to community resources:  Skilled Nursing Facility  Patient/Family's Response to  care:  Patient oriented to self only. Patient's daughter agreeable to return to SNF. Patient's family supportive and involved in patient's care. Patient's daughter appreciated social work intervention.  Patient/Family's Understanding of and Emotional Response to Diagnosis, Current Treatment, and Prognosis:  Patient oriented to self only. Patient's daughter has a good understanding of the reason for admission and her need to return to SNF once stable. Patient's daughter appears happy with hospital care.  Emotional Assessment Appearance:  Appears stated age Attitude/Demeanor/Rapport:  Unable to Assess Affect (typically observed):  Unable to Assess Orientation:  Oriented to Self Alcohol / Substance use:  Never Used Psych involvement (Current and /or in the community):  No (Comment)  Discharge Needs  Concerns to be addressed:  Care Coordination Readmission within the last 30 days:  No Current discharge risk:  Cognitively Impaired Barriers to Discharge:  Continued Medical Work up   Margarito LinerSarah C Boswell, LCSW 02/14/2017, 3:20 PM

## 2017-02-14 NOTE — Clinical Social Work Note (Signed)
Patient is a [facility]. Patient oriented to self only. No supports at bedside. CSW left voicemail for patient's daughter. Awaiting call back to discuss return to SNF when stable.  Charlynn CourtSarah Boswell, CSW [phone]

## 2017-02-14 NOTE — Progress Notes (Addendum)
PROGRESS NOTE    Roberta Griffin  ZOX:[id number]  DOB: 05/12/1926  DOA: 02/12/2017 PCP: Patient, No Pcp Per   Brief Admission Hx: Roberta Griffin is a 83 y.o. female with a past medical history significant for advanced dementia, CKD III, HTN, and COPD who presents with syncope twice today.  MDM/Assessment & Plan:   1. Syncope:  No electrolyte abnormalities, evidence to suggest infection, new focal neurological deficits nor electrocardiographic abnormalities.  No orthostasis. -Echocardiogram still pending -Monitor on tele -TSH OK  2. HFpEF:  Appears euvolemic. -Continue metoprolol  3. History of PE in 2017:  -Continue apixaban  4. History of CVA:  Mild residual deficits.  5. Dementia with behavioral disturbance:  -Continue Risperdal, donepezil -Continue memantine -Continue lorazepam -Continue Buspar -Continue acetaminophen  6. CKD III: Cr at baseline near 1.2  7. Multinodular Goiter - Thyroid US suggest some nodules that could be biopsied, will need to follow up with endocrinology outpatient after discharge regarding the appropriateness of biopsy given patient's dementia, advanced age and co-morbidities.    Code Status: DNR Family Communication: none present Disposition Plan: SNF possibly today but awaiting echo, otherwise tomorrow   Subjective: Pt demented but pleasant, says she feels fine.  No further syncope  Objective: Vitals:   02/13/17 1945 02/14/17 0602 02/14/17 0935 02/14/17 1252  BP: (!) 154/78 (!) 174/72  (!) 111/58  Pulse: (!) 57 (!) 57 71 (!) 59  Resp: 18 15  16   Temp: 97.6 F (36.4 C) 98.5 F (36.9 C)  97.8 F (36.6 C)  TempSrc: Oral Oral  Oral  SpO2: 100% 97%  98%  Weight:  57.2 kg (126 lb)    Height:        Intake/Output Summary (Last 24 hours) at 02/14/17 1717 Last data filed at 02/14/17 1354  Gross per 24 hour  Intake           1367.5 ml  Output             1490 ml  Net           -122.5 ml   Filed Weights   02/13/17 0417  02/14/17 0602  Weight: 58.8 kg (129 lb 11.2 oz) 57.2 kg (126 lb)    REVIEW OF SYSTEMS  UTO dementia  Exam:  General exam: awake, confused, demented, NAD. Cooperative.  Respiratory system: Clear. No increased work of breathing. Cardiovascular system: S1 & S2 heard, RRR. No JVD, murmurs, gallops, clicks or pedal edema. Gastrointestinal system: Abdomen is nondistended, soft and nontender. Normal bowel sounds heard. Central nervous system: Alert and oriented. No focal neurological deficits. Extremities: no CCE.  Data Reviewed: Basic Metabolic Panel:  Recent Labs Lab 02/12/17 2258 02/13/17 0505 02/14/17 0337  NA 143 143 142  K 3.6 3.7 3.7  CL 111 110 113*  CO2 26 25 23   GLUCOSE 108* 101* 99  BUN 21* 17 15  CREATININE 1.45* 1.22* 1.01*  CALCIUM 9.0 9.0 8.8*  MG 2.0  --   --    Liver Function Tests:  Recent Labs Lab 02/12/17 2258  AST 21  ALT 11*  ALKPHOS 66  BILITOT 0.3  PROT 6.2*  ALBUMIN 3.3*   No results for input(s): LIPASE, AMYLASE in the last 168 hours. No results for input(s): AMMONIA in the last 168 hours. CBC:  Recent Labs Lab 02/12/17 2258 02/14/17 0337  WBC 7.3 5.9  NEUTROABS 5.4 3.7  HGB 11.6* 12.4  HCT 36.7 38.5  MCV 88.9 87.7  PLT 157 165   Cardiac  Enzymes: No results for input(s): CKTOTAL, CKMB, CKMBINDEX, TROPONINI in the last 168 hours. CBG (last 3)   Recent Labs  02/13/17 0509 02/14/17 0606  GLUCAP 96 95   Recent Results (from the past 240 hour(s))  MRSA PCR Screening     Status: None   Collection Time: 02/13/17  4:23 AM  Result Value Ref Range Status   MRSA by PCR NEGATIVE NEGATIVE Final    Comment:        The GeneXpert MRSA Assay (FDA approved for NASAL specimens only), is one component of a comprehensive MRSA colonization surveillance program. It is not intended to diagnose MRSA infection nor to guide or monitor treatment for MRSA infections.      Studies: Dg Chest 2 View  Result Date: 02/13/2017 CLINICAL DATA:   Syncope EXAM: CHEST  2 VIEW COMPARISON:  Chest CT 02/19/2016 FINDINGS: There is mild cardiomegaly with calcific aortic atherosclerosis. Mildly increased interstitial opacities bilaterally. No pleural effusion or pneumothorax. No focal consolidation. Bilateral shoulder hemiarthroplasty is. IMPRESSION: Cardiomegaly and mild interstitial edema. No focal airspace consolidation. Electronically Signed   By: Deatra RobinsonKevin  Herman M.D.   On: 02/13/2017 00:17   Ct Head Wo Contrast  Result Date: 02/13/2017 CLINICAL DATA:  Syncopal episode EXAM: CT HEAD WITHOUT CONTRAST TECHNIQUE: Contiguous axial images were obtained from the base of the skull through the vertex without intravenous contrast. COMPARISON:  02/17/2016 and CT FINDINGS: Brain: Superficial and central atrophy with moderate degree of periventricular and subcortical white matter small vessel ischemic disease. Chronic lacunar infarct of the right caudate head. A idiopathic bilateral basal ganglial calcifications. No large vascular territory infarction. No intracranial hemorrhage, midline shift or edema. No intra-axial mass nor extra-axial fluid collections. Vascular: No hyperdense vessel or unexpected calcification. Skull: Probable right frontal calcified meningioma as suggested on prior MRI from 02/18/2016. Sinuses/Orbits: No acute finding. Other: None. IMPRESSION: 1. Chronic small vessel ischemic disease. No acute intracranial abnormality. 2. Small chronic caudate lacunar infarct on the right. 3. Cerebral atrophy. 4. Calcified frontal meningioma. Electronically Signed   By: Tollie Ethavid  Kwon M.D.   On: 02/13/2017 00:04   Ct Angio Chest Pe W Or Wo Contrast  Result Date: 02/13/2017 CLINICAL DATA:  Acute onset of syncope.  Initial encounter. EXAM: CT ANGIOGRAPHY CHEST WITH CONTRAST TECHNIQUE: Multidetector CT imaging of the chest was performed using the standard protocol during bolus administration of intravenous contrast. Multiplanar CT image reconstructions and MIPs were  obtained to evaluate the vascular anatomy. CONTRAST:  72 mL of Isovue 370 IV contrast COMPARISON:  Chest CT performed 02/19/2016, and chest radiograph performed 02/12/2017 FINDINGS: Cardiovascular:  There is no evidence of pulmonary embolus. The heart is enlarged. Diffuse coronary artery calcifications are seen. Scattered calcification is noted along the aortic arch and descending thoracic aorta. The great vessels are grossly unremarkable in appearance. Mediastinum/Nodes: A small pericardial effusion is noted. No definite mediastinal lymphadenopathy is seen. Fluid tracks along the subcarinal region. There is a large left thyroid mass, measuring perhaps 4.4 cm in size. No axillary lymphadenopathy is seen. However, the axilla are not well characterized due to metal artifact. Lungs/Pleura: Mild bibasilar atelectasis or scarring is noted. Mild interstitial prominence is noted. Mild scarring is noted at the lung apices. No pleural effusion or pneumothorax is seen. No masses are identified. Upper Abdomen: The visualized portions of the liver and spleen are unremarkable. The visualized portions of the gallbladder, adrenal glands and kidneys are within normal limits. Scattered calcification is noted along the proximal abdominal aorta. Musculoskeletal: No  acute osseous abnormalities are identified. Mild degenerative change is noted at the upper thoracic spine. Bilateral shoulder arthroplasties are grossly unremarkable. The visualized musculature is unremarkable in appearance. Review of the MIP images confirms the above findings. IMPRESSION: 1. No evidence of pulmonary embolus. 2. Mild bibasilar atelectasis or scarring noted. Mild interstitial prominence seen. Mild scarring at the lung apices. 3. Cardiomegaly.  Diffuse coronary artery calcifications seen. 4. Small pericardial effusion. 5. Large left thyroid mass, measuring perhaps 4.4 cm in size. This may be increased in size from 2017. Recommend further evaluation with  thyroid ultrasound. If patient is clinically hyperthyroid, consider nuclear medicine thyroid uptake and scan. 6. Scattered aortic atherosclerosis. Electronically Signed   By: Roanna Raider M.D.   On: 02/13/2017 01:36   US Thyroid  Result Date: 02/13/2017 CLINICAL DATA:  Enlarged thyroid on chest CT. History of advanced dementia. EXAM: THYROID ULTRASOUND TECHNIQUE: Ultrasound examination of the thyroid gland and adjacent soft tissues was performed. COMPARISON:  CT 02/13/2017 FINDINGS: Parenchymal Echotexture: Markedly heterogenous Isthmus: 0.4 cm in the AP dimension. Right lobe: 4.9 x 2.2 x 2.6 cm Left lobe: 3.7 x 6.8 x 3.2 cm _________________________________________________________ Estimated total number of nodules >/= 1 cm: 4 Number of spongiform nodules >/=  2 cm not described below (TR1): 0 Number of mixed cystic and solid nodules >/= 1.5 cm not described below (TR2): 0 _________________________________________________________ Nodule # 1: Location: Isthmus; Superior Maximum size: 1.0 cm; Other 2 dimensions: 0.6 x 0.9 cm Composition: mixed cystic and solid (1) Echogenicity: cannot determine (1) Shape: not taller-than-wide (0) Margins: smooth (0) Echogenic foci: none (0) ACR TI-RADS total points: 2. ACR TI-RADS risk category: TR2 (2 points). ACR TI-RADS recommendations: This nodule does NOT meet TI-RADS criteria for biopsy or dedicated follow-up. _________________________________________________________ Nodule # 2: Location: Right; Mid Maximum size: 1.3 cm; Other 2 dimensions: 0.9 x 1.2 cm Composition: mixed cystic and solid (1) Echogenicity: cannot determine (1) Shape: not taller-than-wide (0) Margins: smooth (0) Echogenic foci: none (0) ACR TI-RADS total points: 2. ACR TI-RADS risk category: TR2 (2 points). ACR TI-RADS recommendations: This nodule does NOT meet TI-RADS criteria for biopsy or dedicated follow-up. _________________________________________________________ Nodule # 3: Location: Left; Inferior  Maximum size: 3.7 cm; Other 2 dimensions: 2.8 x 2.9 cm Composition: solid/almost completely solid (2) Echogenicity: isoechoic (1) Shape: not taller-than-wide (0) Margins: ill-defined (0) Echogenic foci: none (0) ACR TI-RADS total points: 3. ACR TI-RADS risk category: TR3 (3 points). ACR TI-RADS recommendations: **Given size (>/= 2.5 cm) and appearance, fine needle aspiration of this mildly suspicious nodule should be considered based on TI-RADS criteria. _________________________________________________________ Nodule # 4: Location: Left; Superior Maximum size: 3.9 cm; Other 2 dimensions: 2.2 x 2.9 cm Composition: solid/almost completely solid (2) Echogenicity: isoechoic (1) Shape: not taller-than-wide (0) Margins: ill-defined (0) Echogenic foci: none (0) ACR TI-RADS total points: 3. ACR TI-RADS risk category: TR3 (3 points). ACR TI-RADS recommendations: **Given size (>/= 2.5 cm) and appearance, fine needle aspiration of this mildly suspicious nodule should be considered based on TI-RADS criteria. _________________________________________________________ IMPRESSION: Multinodular goiter. Two dominant nodules occupying a large portion of the left thyroid lobe. Both of these nodules meet criteria for biopsy. Recommend clinical correlation with regards to the need for tissue sampling based on patient's age and comorbidities. The above is in keeping with the ACR TI-RADS recommendations - J Am Coll Radiol 2017;14:587-595. Electronically Signed   By: Richarda Overlie M.D.   On: 02/13/2017 14:03     Scheduled Meds: . apixaban  2.5 mg Oral BID  .  busPIRone  15 mg Oral QHS  . busPIRone  7.5 mg Oral Daily  . donepezil  10 mg Oral QHS  . memantine  28 mg Oral Daily  . metoprolol succinate  50 mg Oral Daily  . risperiDONE  0.5 mg Oral QHS  . sodium chloride flush  3 mL Intravenous Q12H   Continuous Infusions:  Principal Problem:   Syncope Active Problems:   COPD (chronic obstructive pulmonary disease) (HCC)    Dementia   Essential hypertension   Normocytic anemia   CKD (chronic kidney disease), stage III   Chronic diastolic CHF (congestive heart failure) (HCC)   Chronic pulmonary embolism (HCC)  Time spent:   Standley Dakins, MD, FAAFP [facility] Pager [phone]  If 7PM-7AM, please contact night-coverage [email or web address] Password [email or web address] 02/14/2017, 5:17 PM    LOS: 1 day

## 2017-02-15 ENCOUNTER — Emergency Department (HOSPITAL_COMMUNITY): Payer: Medicare Other

## 2017-02-15 ENCOUNTER — Emergency Department (HOSPITAL_COMMUNITY)
Admission: EM | Admit: 2017-02-15 | Discharge: 2017-02-16 | Disposition: A | Discharge status: Home / Self Care | Payer: Medicare Other | Attending: Emergency Medicine | Admitting: Emergency Medicine

## 2017-02-15 ENCOUNTER — Encounter (HOSPITAL_COMMUNITY): Payer: Self-pay | Admitting: Emergency Medicine

## 2017-02-15 DIAGNOSIS — J449 Chronic obstructive pulmonary disease, unspecified: Secondary | ICD-10-CM | POA: Insufficient documentation

## 2017-02-15 DIAGNOSIS — Z79899 Other long term (current) drug therapy: Secondary | ICD-10-CM | POA: Diagnosis not present

## 2017-02-15 DIAGNOSIS — Y929 Unspecified place or not applicable: Secondary | ICD-10-CM | POA: Insufficient documentation

## 2017-02-15 DIAGNOSIS — I10 Essential (primary) hypertension: Secondary | ICD-10-CM | POA: Insufficient documentation

## 2017-02-15 DIAGNOSIS — F0390 Unspecified dementia without behavioral disturbance: Secondary | ICD-10-CM | POA: Diagnosis not present

## 2017-02-15 DIAGNOSIS — S42122A Displaced fracture of acromial process, left shoulder, initial encounter for closed fracture: Secondary | ICD-10-CM | POA: Diagnosis not present

## 2017-02-15 DIAGNOSIS — S42125A Nondisplaced fracture of acromial process, left shoulder, initial encounter for closed fracture: Secondary | ICD-10-CM | POA: Diagnosis not present

## 2017-02-15 DIAGNOSIS — S59902A Unspecified injury of left elbow, initial encounter: Secondary | ICD-10-CM | POA: Diagnosis not present

## 2017-02-15 DIAGNOSIS — W19XXXA Unspecified fall, initial encounter: Secondary | ICD-10-CM | POA: Insufficient documentation

## 2017-02-15 DIAGNOSIS — D649 Anemia, unspecified: Secondary | ICD-10-CM

## 2017-02-15 DIAGNOSIS — Y998 Other external cause status: Secondary | ICD-10-CM | POA: Insufficient documentation

## 2017-02-15 DIAGNOSIS — S3993XA Unspecified injury of pelvis, initial encounter: Secondary | ICD-10-CM | POA: Diagnosis not present

## 2017-02-15 DIAGNOSIS — S0990XA Unspecified injury of head, initial encounter: Secondary | ICD-10-CM | POA: Diagnosis not present

## 2017-02-15 DIAGNOSIS — S199XXA Unspecified injury of neck, initial encounter: Secondary | ICD-10-CM | POA: Diagnosis not present

## 2017-02-15 DIAGNOSIS — M25522 Pain in left elbow: Secondary | ICD-10-CM | POA: Diagnosis not present

## 2017-02-15 DIAGNOSIS — M25511 Pain in right shoulder: Secondary | ICD-10-CM | POA: Diagnosis not present

## 2017-02-15 DIAGNOSIS — Y939 Activity, unspecified: Secondary | ICD-10-CM | POA: Insufficient documentation

## 2017-02-15 DIAGNOSIS — Z8673 Personal history of transient ischemic attack (TIA), and cerebral infarction without residual deficits: Secondary | ICD-10-CM | POA: Insufficient documentation

## 2017-02-15 LAB — GLUCOSE, CAPILLARY: Glucose-Capillary: 75 mg/dL (ref 65–99)

## 2017-02-15 LAB — ECHOCARDIOGRAM COMPLETE
Height: 66 in
Weight: 2016 oz

## 2017-02-15 NOTE — ED Triage Notes (Signed)
Per EMS, pt from [facility] facility. Pt was recently d/c from hospital for a fall. Pt back again for a fall around 10pm tonight from the standing position. Pt struck the left side of her head, some redness and swelling visible. Denies LOC. Hx of dementia. Pt alert to self and follows commands. Pt reports it is painful to move left arm, facility states it is from hx of arthritis. Pt has hx of stroke as well, pupils 2mm on left eye and 4mm rt eye which is non-reactive. EMS VS 172/90, HR 62, R 16, 97% room air, CBG 177.   Per facility paperwork, pt's eliquis was discontinued back in November. Unsure of last dose.

## 2017-02-15 NOTE — ED Provider Notes (Signed)
[facility]-EMERGENCY DEPT Provider Note   CSN: [id number] Arrival date & time: 02/15/17  2243   By signing my name below, I, Roberta Griffin, attest that this documentation has been prepared under the direction and in the presence of Rancour, Jeannett Senior, MD. Electronically signed, Roberta Griffin, ED Scribe. 02/15/17. 11:32 PM.   History   Chief Complaint Chief Complaint  Patient presents with  . Fall   LEVEL 5 CAVEAT: HPI and ROS limited due to dementia   The history is provided by medical records and the patient. The history is limited by the condition of the patient. No language interpreter was used.    Roberta Griffin is a 83 y.o. female with h/o dementia, stroke and frequent falls BIB EMS from [facility] facility to the Emergency Department concerning L arm pain onset PTA. Pt allegedly fell from a standing position ~10 PM this evening, striking the L side of her head on impact with the ground. She currently notes L elbow and shoulder pains. Pt disoriented to time and place on evaluation. She states she is ambulatory with a walker at baseline. Pt discharged from [facility] ~12 PM today. No headache, neck pain, back pain. Pt denies LOC. No other complaints at this time.   Past Medical History:  Diagnosis Date  . COPD (chronic obstructive pulmonary disease) (HCC)   . Dementia   . Depression   . Dysphagia   . Hypertension   . Scoliosis     Patient Active Problem List   Diagnosis Date Noted  . Syncope 02/13/2017  . Chronic arterial ischemic stroke 12/05/2016  . Chronic pulmonary embolism (HCC) 12/05/2016  . Chronic diastolic CHF (congestive heart failure) (HCC) 03/04/2016  . Insomnia 03/04/2016  . Dysarthria 03/04/2016  . Pericardial effusion 03/04/2016  . Thyroid nodule 03/04/2016  . Pancreatic mass   . HLD (hyperlipidemia)   . Dysphagia 02/18/2016  . COPD (chronic obstructive pulmonary disease) (HCC) 02/17/2016  . Dementia 02/17/2016  . Essential hypertension 02/17/2016  .  Normocytic anemia 02/17/2016  . CKD (chronic kidney disease), stage III 02/17/2016    Past Surgical History:  Procedure Laterality Date  . ABDOMINAL HYSTERECTOMY    . KNEE SURGERY    . SHOULDER SURGERY    . VITRECTOMY AND CATARACT      OB History    No data available       Home Medications    Prior to Admission medications   Medication Sig Start Date End Date Taking? Authorizing Provider  acetaminophen (TYLENOL) 325 MG tablet Take 2 tablets (650 mg total) by mouth every 6 (six) hours as needed for mild pain, moderate pain, fever or headache. 02/23/16  Yes Hongalgi, Maximino Greenland, MD  apixaban (ELIQUIS) 2.5 MG TABS tablet Take 2.5 mg by mouth 2 (two) times daily.   Yes [provider]  memantine (NAMENDA XR) 28 MG CP24 24 hr capsule Take 28 mg by mouth daily.   Yes [provider]  risperiDONE (RISPERDAL) 0.5 MG tablet Take 0.5 mg by mouth at bedtime.   Yes [provider]  busPIRone (BUSPAR) 15 MG tablet Give 0.5 mg tablet by mouth daily and 1 tablet by mouth at bedtime    [provider]  donepezil (ARICEPT) 10 MG tablet Take 1 tablet (10 mg total) by mouth at bedtime. 02/09/16   Mesner, Barbara Cower, MD  Lidocaine 4 % PTCH Place 1 patch onto the skin daily. Apply to lower back.  Remove & Discard patch within 12 hours or as directed by MD  [provider]  metoprolol succinate (TOPROL-XL) 50 MG 24 hr tablet Take 1 tablet (50 mg total) by mouth daily. Take with or immediately following a meal. Patient not taking: Reported on 02/15/2017 02/09/16   Mesner, Barbara Cower, MD    Family History Family History  Problem Relation Age of Onset  . Stroke Neg Hx     Social History Social History  Substance Use Topics  . Smoking status: Never Smoker  . Smokeless tobacco: Never Used  . Alcohol use No     Allergies   Patient has no known allergies.   Review of Systems Review of Systems  Unable to perform ROS: Dementia     Physical Exam Updated Vital  Signs BP (!) 170/78 (BP Location: Right Arm)   Pulse 63   Temp 98 F (36.7 C) (Oral)   Resp 16   SpO2 99%   Physical Exam  Constitutional: She appears well-developed and well-nourished. No distress.  HENT:  Head: Normocephalic and atraumatic.  Mouth/Throat: Oropharynx is clear and moist. No oropharyngeal exudate.  Eyes: Conjunctivae are normal.  L pupil 2 mm, non reactive. R pupil irregular, non reactive.  Neck: Normal range of motion. Neck supple.  No meningismus.  Cardiovascular: Normal rate, regular rhythm, normal heart sounds and intact distal pulses.   No murmur heard. Pulmonary/Chest: Effort normal and breath sounds normal. No respiratory distress.  Abdominal: Soft. There is no tenderness. There is no rebound and no guarding.  Musculoskeletal: Normal range of motion. She exhibits tenderness. She exhibits no edema or deformity.  Pelvis stable. Tender to L shoulder and elbow, no deformity. Tenderness to R shoulder. FROM of hips without pain.  Neurological: She is alert. No cranial nerve deficit. She exhibits normal muscle tone. Coordination normal.   5/5 strength throughout. CN 2-12 intact.Equal grip strength. Oriented x 1.  Skin: Skin is warm.  Psychiatric: She has a normal mood and affect. Her behavior is normal.  Nursing note and vitals reviewed.    ED Treatments / Results  DIAGNOSTIC STUDIES: Oxygen Saturation is 99% on RA, NL by my interpretation.     Labs (all labs ordered are listed, but only abnormal results are displayed) Labs Reviewed - No data to display  EKG  EKG Interpretation  Date/Time:  Friday February 15 2017 22:55:11 EDT Ventricular Rate:  64 PR Interval:    QRS Duration: 146 QT Interval:  461 QTC Calculation: 476 R Axis:   38 Text Interpretation:  Sinus rhythm Atrial premature complex Right bundle branch block No significant change was found Confirmed by Glynn Octave [phone]) on 02/15/2017 10:59:12 PM       Radiology Dg Pelvis 1-2  Views  Result Date: 02/16/2017 CLINICAL DATA:  Status post fall, with concern for pelvic injury. Initial encounter. EXAM: PELVIS - 1-2 VIEW COMPARISON:  CT of the abdomen and pelvis performed 02/20/2016 FINDINGS: There is no evidence of fracture or dislocation. Both femoral heads are seated normally within their respective acetabula. Mild degenerative change is noted at the lower lumbar spine. The sacroiliac joints are grossly unremarkable. The visualized bowel gas pattern is grossly unremarkable in appearance. Scattered uterine fibroid calcifications are noted. IMPRESSION: No evidence of fracture or dislocation. Electronically Signed   By: Roanna Raider M.D.   On: 02/16/2017 00:22   Dg Shoulder Right  Result Date: 02/16/2017 CLINICAL DATA:  Status post fall, with right shoulder pain. Initial encounter. EXAM: RIGHT SHOULDER - 2+ VIEW COMPARISON:  None. FINDINGS: There is overriding of the right distal clavicle  with respect to the acromion, which may reflect a [place] type 2 acromioclavicular joint injury, of indeterminate age. The patient's right shoulder arthroplasty is grossly unremarkable. There is no evidence of fracture or dislocation. The visualized portions of the right lung are clear. IMPRESSION: 1. No evidence of fracture or dislocation. 2. Overriding of the right distal clavicle with respect to the acromion, which may reflect a [place] type 2 acromioclavicular joint injury, of indeterminate age. Electronically Signed   By: Roanna RaiderJeffery  Chang M.D.   On: 02/16/2017 00:24   Dg Elbow Complete Left  Result Date: 02/16/2017 CLINICAL DATA:  Status post fall, with left elbow pain. Initial encounter. EXAM: LEFT ELBOW - COMPLETE 3+ VIEW COMPARISON:  None. FINDINGS: There is no evidence of fracture or dislocation. The visualized joint spaces are preserved. No significant joint effusion is identified. The soft tissues are unremarkable in appearance. IMPRESSION: No evidence of fracture or dislocation.  Electronically Signed   By: Roanna RaiderJeffery  Chang M.D.   On: 02/16/2017 00:21   Ct Head Wo Contrast  Result Date: 02/16/2017 CLINICAL DATA:  Fall tonight from standing striking head. EXAM: CT HEAD WITHOUT CONTRAST CT CERVICAL SPINE WITHOUT CONTRAST TECHNIQUE: Multidetector CT imaging of the head and cervical spine was performed following the standard protocol without intravenous contrast. Multiplanar CT image reconstructions of the cervical spine were also generated. COMPARISON:  Head CT 3 days prior 02/12/2017 FINDINGS: CT HEAD FINDINGS Brain: No evidence of acute infarction, hemorrhage, hydrocephalus, extra-axial collection or mass lesion/mass effect. Atrophy and chronic small vessel ischemia are stable. Encephalomalacia in the right frontal parietal lobe is unchanged. Small remote lacunar infarct in the right caudate unchanged. Again seen basal gangliar calcifications. Vascular: Atherosclerosis of skullbase vasculature without hyperdense vessel or abnormal calcification. Skull: No skull fracture.  No focal lesion. Sinuses/Orbits: Paranasal sinuses and mastoid air cells are clear. The visualized orbits are unremarkable. Bilateral cataract resection. Other: None. CT CERVICAL SPINE FINDINGS Alignment: Anterolisthesis of C4 on C5 and C7 on T1 is chronic. There is moderate dextroscoliosis of the cervical spine. No evidence of traumatic malalignment. Skull base and vertebrae: No acute fracture. Bony fusion of C3 and C4, and partial bony fusion of C4 and C5 posterior elements and vertebral bodies is likely congenital. The dens is intact. No skullbase fracture. Soft tissues and spinal canal: No prevertebral fluid or swelling. No visible canal hematoma. Disc levels: Bony fusion of C3 and C4 and incomplete bony fusion of C4 and C5. Disc space narrowing throughout the remainder levels. Multilevel facet arthropathy. Upper chest: Emphysema with pleuroparenchymal scarring. Other: Chronic calcifications. IMPRESSION: 1. No acute  intracranial abnormality. No skull fracture. Stable atrophy, chronic and remote ischemia. 2. Chronic and degenerative change in the cervical spine without evidence of acute fracture. Electronically Signed   By: Rubye OaksMelanie  Ehinger M.D.   On: 02/16/2017 00:06   Ct Cervical Spine Wo Contrast  Result Date: 02/16/2017 CLINICAL DATA:  Fall tonight from standing striking head. EXAM: CT HEAD WITHOUT CONTRAST CT CERVICAL SPINE WITHOUT CONTRAST TECHNIQUE: Multidetector CT imaging of the head and cervical spine was performed following the standard protocol without intravenous contrast. Multiplanar CT image reconstructions of the cervical spine were also generated. COMPARISON:  Head CT 3 days prior 02/12/2017 FINDINGS: CT HEAD FINDINGS Brain: No evidence of acute infarction, hemorrhage, hydrocephalus, extra-axial collection or mass lesion/mass effect. Atrophy and chronic small vessel ischemia are stable. Encephalomalacia in the right frontal parietal lobe is unchanged. Small remote lacunar infarct in the right caudate unchanged. Again seen basal gangliar  calcifications. Vascular: Atherosclerosis of skullbase vasculature without hyperdense vessel or abnormal calcification. Skull: No skull fracture.  No focal lesion. Sinuses/Orbits: Paranasal sinuses and mastoid air cells are clear. The visualized orbits are unremarkable. Bilateral cataract resection. Other: None. CT CERVICAL SPINE FINDINGS Alignment: Anterolisthesis of C4 on C5 and C7 on T1 is chronic. There is moderate dextroscoliosis of the cervical spine. No evidence of traumatic malalignment. Skull base and vertebrae: No acute fracture. Bony fusion of C3 and C4, and partial bony fusion of C4 and C5 posterior elements and vertebral bodies is likely congenital. The dens is intact. No skullbase fracture. Soft tissues and spinal canal: No prevertebral fluid or swelling. No visible canal hematoma. Disc levels: Bony fusion of C3 and C4 and incomplete bony fusion of C4 and C5.  Disc space narrowing throughout the remainder levels. Multilevel facet arthropathy. Upper chest: Emphysema with pleuroparenchymal scarring. Other: Chronic calcifications. IMPRESSION: 1. No acute intracranial abnormality. No skull fracture. Stable atrophy, chronic and remote ischemia. 2. Chronic and degenerative change in the cervical spine without evidence of acute fracture. Electronically Signed   By: Rubye Oaks M.D.   On: 02/16/2017 00:06   Dg Shoulder Left  Result Date: 02/16/2017 CLINICAL DATA:  Status post fall, with left shoulder pain. Initial encounter. EXAM: LEFT SHOULDER - 2+ VIEW COMPARISON:  None. FINDINGS: There is question of a mildly displaced fracture at the left acromion. The left acromioclavicular joint is grossly unremarkable. The left shoulder arthroplasty is grossly unremarkable, without evidence of loosening. The left lung appears relatively clear. No definite soft tissue abnormalities are characterized on radiograph. IMPRESSION: Apparent mildly displaced fracture of the left acromion on a single view. Would correlate for associated symptoms. Electronically Signed   By: Roanna Raider M.D.   On: 02/16/2017 00:21    Procedures Procedures (including critical care time)  Medications Ordered in ED Medications  acetaminophen (TYLENOL) tablet 650 mg (650 mg Oral Given 02/16/17 0114)     Initial Impression / Assessment and Plan / ED Course  I have reviewed the triage vital signs and the nursing notes.  Pertinent labs & imaging results that were available during my care of the patient were reviewed by me and considered in my medical decision making (see chart for details).    Level 5 caveat for dementia.  Patient from [facility] after unwitnessed fall.  She was discharged from the hospital less than 12 hours ago after admission for syncope workup.   Imaging is remarkable for possible acromial fracture on the left. Patient given sling. Her right shoulder has no pain so these AC  findings appear to be chronic.  CT head and C-spine negative. EKG unchanged.  Sling given for left acromial fracture. Patient is ambulatory with her walker as per baseline.  She appears stable for return for her facility. Follow-up with orthopedics. Return precautions discussed.  Final Clinical Impressions(s) / ED Diagnoses   Final diagnoses:  Fall, initial encounter  Closed nondisplaced fracture of left acromial process, initial encounter    New Prescriptions New Prescriptions   No medications on file  I personally performed the services described in this documentation, which was scribed in my presence. The recorded information has been reviewed and is accurate.    Glynn Octave, MD 02/16/17 [phone]

## 2017-02-15 NOTE — Discharge Summary (Signed)
Physician Discharge Summary  Madellyn Denio ZOX:[id number] DOB: 1925-10-11 DOA: 02/12/2017  PCP: Patient, No Pcp Per  Admit date: 02/12/2017 Discharge date: 02/15/2017  Admitted From: SNF Disposition: SNF  Recommendations for Outpatient Follow-up:  1. Follow up with PCP in 1 weeks 2. Please refer to endocrinologist to evaluate thyroid nodules that may need biopsy (see below) 3. Fall Precautions 4. Dementia precautions  Discharge Condition: STABLE   CODE STATUS: DNR   Brief Hospitalization Summary: Please see all hospital notes, images, labs for full details of the hospitalization.  HPI: Roberta Griffin is a 83 y.o. female with a past medical history significant for advanced dementia, CKD III, HTN, and COPD who presents with syncope twice today.  Caveat that the patient is unable to provide history due to dementia and daughter was no present for event.  Given absence of a detailed history, workup for her syncopal event is challenging.  Per report, the patient was in her normal health today, exhibiting no outward signs of illness that daughter is aware of (fever, cough, vomiting, confusion, decreased mental awareness) until this evening when she was sitting in her wheelchair, was observed by staff to be short of breath, then weak and dizzy.  Supplemental oxygen was administered, but then she seemed to get weaker and passed out for a few seconds to a minute while sitting in her wheelchair. She reportedly came to, then appeared weak again and passed out a second time, so she was transported to the emergency room.  ED course: -Afebrile, heart rate 69, respirations and pulse is normal, blood pressure 126/55 -Orthostatics normal -Na 143, K 3.6, Cr 1.45 (baseline 1.2), WBC 7.3K, Hgb 11.6 and normocytic and stable at baseline -BNP, troponin, and d-dimer normal -CT head without change from previous -Magnesium normal -Chest x-ray showed possible increased interstitial markings, no overt edema,  trace pleural effusion -CTA chest was obtained that showed no edema, no PE, her known thyroid mass -ECG was obtained that showed an old RBBB, no change from previous -TRH were asked to observe in the setting of syncope  MDM/Assessment & Plan:   1. Syncope: No electrolyte abnormalities, evidence to suggest infection, new focal neurological deficits nor electrocardiographic abnormalities. No orthostasis. -Echocardiogram OK -Monitored on tele and remained stable, no events in hospital, nothing found to explain events -TSH OK  2. HFpEF: Appears euvolemic. -Continue metoprolol  3. History of PE in 2017: -Continue apixaban  4. History of CVA: Mild residual deficits.  5. Dementia with behavioral disturbance: -Continue Risperdal, donepezil -Continue memantine -Continue Buspar -Continue acetaminophen  6. CKD III: Cr at baseline near 1.2  7. Multinodular Goiter - Thyroid US suggest some nodules that could be biopsied, will need to follow up with endocrinology outpatient after discharge regarding the appropriateness of biopsy given patient's dementia, advanced age and co-morbidities.    Code Status: DNR Family Communication: none present Disposition Plan: SNF possibly today but awaiting echo, otherwise tomorrow   Echo Study Conclusions  - Left ventricle: Global longitudinal strai is -20.2%. The cavity  size was normal. Wall thickness was increased in a pattern of   mild LVH. Systolic function was normal. The estimated ejection  fraction was in the range of 60% to 65%. Doppler parameters are   consistent with abnormal left ventricular relaxation (grade 1  diastolic dysfunction). - Mitral valve: There was mild to moderate regurgitation. - Right atrium: The atrium was mildly dilated. - Tricuspid valve: There was mild-moderate regurgitation. - Pulmonary arteries: PA peak pressure: 40 mm Hg (S). -  Pericardium, extracardiac: A trivial pericardial effusion was    identified.   Discharge Diagnoses:  Principal Problem:   Syncope Active Problems:   COPD (chronic obstructive pulmonary disease) (HCC)   Dementia   Essential hypertension   Normocytic anemia   CKD (chronic kidney disease), stage III   Chronic diastolic CHF (congestive heart failure) (HCC)   Chronic pulmonary embolism [facility])    Discharge Instructions: Discharge Instructions    Increase activity slowly    Complete by:  As directed      Allergies as of 02/15/2017   No Known Allergies     Medication List    STOP taking these medications   LORazepam 0.5 MG tablet Commonly known as:  ATIVAN     TAKE these medications   acetaminophen 325 MG tablet Commonly known as:  TYLENOL Take 2 tablets (650 mg total) by mouth every 6 (six) hours as needed for mild pain, moderate pain, fever or headache.   apixaban 2.5 MG Tabs tablet Commonly known as:  ELIQUIS Take 2.5 mg by mouth 2 (two) times daily.   busPIRone 15 MG tablet Commonly known as:  BUSPAR Give 0.5 mg tablet by mouth daily and 1 tablet by mouth at bedtime   donepezil 10 MG tablet Commonly known as:  ARICEPT Take 1 tablet (10 mg total) by mouth at bedtime.   Lidocaine 4 % Ptch Place 1 patch onto the skin daily. Apply to lower back.  Remove & Discard patch within 12 hours or as directed by MD   metoprolol succinate 50 MG 24 hr tablet Commonly known as:  TOPROL-XL Take 1 tablet (50 mg total) by mouth daily. Take with or immediately following a meal.   NAMENDA XR 28 MG Cp24 24 hr capsule Generic drug:  memantine Take 28 mg by mouth daily.   risperiDONE 0.5 MG tablet Commonly known as:  RISPERDAL Take 0.5 mg by mouth at bedtime.      Contact information for after-discharge care    Destination    [facility] Follow up.   Specialty:  Skilled Nursing Facility Contact information: [address] [place] [address] [phone]             No Known  Allergies Current Discharge Medication List    CONTINUE these medications which have NOT CHANGED   Details  apixaban (ELIQUIS) 2.5 MG TABS tablet Take 2.5 mg by mouth 2 (two) times daily.    busPIRone (BUSPAR) 15 MG tablet Give 0.5 mg tablet by mouth daily and 1 tablet by mouth at bedtime    donepezil (ARICEPT) 10 MG tablet Take 1 tablet (10 mg total) by mouth at bedtime. Qty: 30 tablet, Refills: 1    Lidocaine 4 % PTCH Place 1 patch onto the skin daily. Apply to lower back.  Remove & Discard patch within 12 hours or as directed by MD    memantine (NAMENDA XR) 28 MG CP24 24 hr capsule Take 28 mg by mouth daily.    metoprolol succinate (TOPROL-XL) 50 MG 24 hr tablet Take 1 tablet (50 mg total) by mouth daily. Take with or immediately following a meal. Qty: 30 tablet, Refills: 1    risperiDONE (RISPERDAL) 0.5 MG tablet Take 0.5 mg by mouth at bedtime.    acetaminophen (TYLENOL) 325 MG tablet Take 2 tablets (650 mg total) by mouth every 6 (six) hours as needed for mild pain, moderate pain, fever or headache.      STOP taking these  medications     LORazepam (ATIVAN) 0.5 MG tablet         Procedures/Studies: Dg Chest 2 View  Result Date: 02/13/2017 CLINICAL DATA:  Syncope EXAM: CHEST  2 VIEW COMPARISON:  Chest CT 02/19/2016 FINDINGS: There is mild cardiomegaly with calcific aortic atherosclerosis. Mildly increased interstitial opacities bilaterally. No pleural effusion or pneumothorax. No focal consolidation. Bilateral shoulder hemiarthroplasty is. IMPRESSION: Cardiomegaly and mild interstitial edema. No focal airspace consolidation. Electronically Signed   By: Deatra Robinson M.D.   On: 02/13/2017 00:17   Ct Head Wo Contrast  Result Date: 02/13/2017 CLINICAL DATA:  Syncopal episode EXAM: CT HEAD WITHOUT CONTRAST TECHNIQUE: Contiguous axial images were obtained from the base of the skull through the vertex without intravenous contrast. COMPARISON:  02/17/2016 and CT FINDINGS: Brain:  Superficial and central atrophy with moderate degree of periventricular and subcortical white matter small vessel ischemic disease. Chronic lacunar infarct of the right caudate head. A idiopathic bilateral basal ganglial calcifications. No large vascular territory infarction. No intracranial hemorrhage, midline shift or edema. No intra-axial mass nor extra-axial fluid collections. Vascular: No hyperdense vessel or unexpected calcification. Skull: Probable right frontal calcified meningioma as suggested on prior MRI from 02/18/2016. Sinuses/Orbits: No acute finding. Other: None. IMPRESSION: 1. Chronic small vessel ischemic disease. No acute intracranial abnormality. 2. Small chronic caudate lacunar infarct on the right. 3. Cerebral atrophy. 4. Calcified frontal meningioma. Electronically Signed   By: Tollie Eth M.D.   On: 02/13/2017 00:04   Ct Angio Chest Pe W Or Wo Contrast  Result Date: 02/13/2017 CLINICAL DATA:  Acute onset of syncope.  Initial encounter. EXAM: CT ANGIOGRAPHY CHEST WITH CONTRAST TECHNIQUE: Multidetector CT imaging of the chest was performed using the standard protocol during bolus administration of intravenous contrast. Multiplanar CT image reconstructions and MIPs were obtained to evaluate the vascular anatomy. CONTRAST:  72 mL of Isovue 370 IV contrast COMPARISON:  Chest CT performed 02/19/2016, and chest radiograph performed 02/12/2017 FINDINGS: Cardiovascular:  There is no evidence of pulmonary embolus. The heart is enlarged. Diffuse coronary artery calcifications are seen. Scattered calcification is noted along the aortic arch and descending thoracic aorta. The great vessels are grossly unremarkable in appearance. Mediastinum/Nodes: A small pericardial effusion is noted. No definite mediastinal lymphadenopathy is seen. Fluid tracks along the subcarinal region. There is a large left thyroid mass, measuring perhaps 4.4 cm in size. No axillary lymphadenopathy is seen. However, the axilla are  not well characterized due to metal artifact. Lungs/Pleura: Mild bibasilar atelectasis or scarring is noted. Mild interstitial prominence is noted. Mild scarring is noted at the lung apices. No pleural effusion or pneumothorax is seen. No masses are identified. Upper Abdomen: The visualized portions of the liver and spleen are unremarkable. The visualized portions of the gallbladder, adrenal glands and kidneys are within normal limits. Scattered calcification is noted along the proximal abdominal aorta. Musculoskeletal: No acute osseous abnormalities are identified. Mild degenerative change is noted at the upper thoracic spine. Bilateral shoulder arthroplasties are grossly unremarkable. The visualized musculature is unremarkable in appearance. Review of the MIP images confirms the above findings. IMPRESSION: 1. No evidence of pulmonary embolus. 2. Mild bibasilar atelectasis or scarring noted. Mild interstitial prominence seen. Mild scarring at the lung apices. 3. Cardiomegaly.  Diffuse coronary artery calcifications seen. 4. Small pericardial effusion. 5. Large left thyroid mass, measuring perhaps 4.4 cm in size. This may be increased in size from 2017. Recommend further evaluation with thyroid ultrasound. If patient is clinically hyperthyroid, consider nuclear medicine  thyroid uptake and scan. 6. Scattered aortic atherosclerosis. Electronically Signed   By: Roanna Raider M.D.   On: 02/13/2017 01:36   US Thyroid  Result Date: 02/13/2017 CLINICAL DATA:  Enlarged thyroid on chest CT. History of advanced dementia. EXAM: THYROID ULTRASOUND TECHNIQUE: Ultrasound examination of the thyroid gland and adjacent soft tissues was performed. COMPARISON:  CT 02/13/2017 FINDINGS: Parenchymal Echotexture: Markedly heterogenous Isthmus: 0.4 cm in the AP dimension. Right lobe: 4.9 x 2.2 x 2.6 cm Left lobe: 3.7 x 6.8 x 3.2 cm _________________________________________________________ Estimated total number of nodules >/= 1 cm: 4  Number of spongiform nodules >/=  2 cm not described below (TR1): 0 Number of mixed cystic and solid nodules >/= 1.5 cm not described below (TR2): 0 _________________________________________________________ Nodule # 1: Location: Isthmus; Superior Maximum size: 1.0 cm; Other 2 dimensions: 0.6 x 0.9 cm Composition: mixed cystic and solid (1) Echogenicity: cannot determine (1) Shape: not taller-than-wide (0) Margins: smooth (0) Echogenic foci: none (0) ACR TI-RADS total points: 2. ACR TI-RADS risk category: TR2 (2 points). ACR TI-RADS recommendations: This nodule does NOT meet TI-RADS criteria for biopsy or dedicated follow-up. _________________________________________________________ Nodule # 2: Location: Right; Mid Maximum size: 1.3 cm; Other 2 dimensions: 0.9 x 1.2 cm Composition: mixed cystic and solid (1) Echogenicity: cannot determine (1) Shape: not taller-than-wide (0) Margins: smooth (0) Echogenic foci: none (0) ACR TI-RADS total points: 2. ACR TI-RADS risk category: TR2 (2 points). ACR TI-RADS recommendations: This nodule does NOT meet TI-RADS criteria for biopsy or dedicated follow-up. _________________________________________________________ Nodule # 3: Location: Left; Inferior Maximum size: 3.7 cm; Other 2 dimensions: 2.8 x 2.9 cm Composition: solid/almost completely solid (2) Echogenicity: isoechoic (1) Shape: not taller-than-wide (0) Margins: ill-defined (0) Echogenic foci: none (0) ACR TI-RADS total points: 3. ACR TI-RADS risk category: TR3 (3 points). ACR TI-RADS recommendations: **Given size (>/= 2.5 cm) and appearance, fine needle aspiration of this mildly suspicious nodule should be considered based on TI-RADS criteria. _________________________________________________________ Nodule # 4: Location: Left; Superior Maximum size: 3.9 cm; Other 2 dimensions: 2.2 x 2.9 cm Composition: solid/almost completely solid (2) Echogenicity: isoechoic (1) Shape: not taller-than-wide (0) Margins: ill-defined (0)  Echogenic foci: none (0) ACR TI-RADS total points: 3. ACR TI-RADS risk category: TR3 (3 points). ACR TI-RADS recommendations: **Given size (>/= 2.5 cm) and appearance, fine needle aspiration of this mildly suspicious nodule should be considered based on TI-RADS criteria. _________________________________________________________ IMPRESSION: Multinodular goiter. Two dominant nodules occupying a large portion of the left thyroid lobe. Both of these nodules meet criteria for biopsy. Recommend clinical correlation with regards to the need for tissue sampling based on patient's age and comorbidities. The above is in keeping with the ACR TI-RADS recommendations - J Am Coll Radiol 2017;14:587-595. Electronically Signed   By: Richarda Overlie M.D.   On: 02/13/2017 14:03      Subjective: Pt has remained stable, no syncope.  Eating and drinking well.    Discharge Exam: Vitals:   02/15/17 0601 02/15/17 1100  BP: (!) 178/86   Pulse: 65 64  Resp: 18   Temp: (!) 97.5 F (36.4 C)    Vitals:   02/14/17 1918 02/15/17 0038 02/15/17 0601 02/15/17 1100  BP: (!) 156/70 (!) 167/84 (!) 178/86   Pulse: 67 63 65 64  Resp: 18 18 18    Temp: 97.6 F (36.4 C) (!) 97.5 F (36.4 C) (!) 97.5 F (36.4 C)   TempSrc: Oral Oral Oral   SpO2: 98% 99% 99%   Weight:   56.9 kg (  125 lb 8 oz)   Height:       General: Pt is alert, demented, awake, not in acute distress Cardiovascular: RRR, S1/S2 +, no rubs, no gallops Respiratory: CTA bilaterally, no wheezing, no rhonchi Abdominal: Soft, NT, ND, bowel sounds + Extremities: no edema, no cyanosis   The results of significant diagnostics from this hospitalization (including imaging, microbiology, ancillary and laboratory) are listed below for reference.     Microbiology: Recent Results (from the past 240 hour(s))  MRSA PCR Screening     Status: None   Collection Time: 02/13/17  4:23 AM  Result Value Ref Range Status   MRSA by PCR NEGATIVE NEGATIVE Final    Comment:         The GeneXpert MRSA Assay (FDA approved for NASAL specimens only), is one component of a comprehensive MRSA colonization surveillance program. It is not intended to diagnose MRSA infection nor to guide or monitor treatment for MRSA infections.      Labs: BNP (last 3 results)  Recent Labs  02/12/17 2244  BNP 92.7   Basic Metabolic Panel:  Recent Labs Lab 02/12/17 2258 02/13/17 0505 02/14/17 0337  NA 143 143 142  K 3.6 3.7 3.7  CL 111 110 113*  CO2 26 25 23   GLUCOSE 108* 101* 99  BUN 21* 17 15  CREATININE 1.45* 1.22* 1.01*  CALCIUM 9.0 9.0 8.8*  MG 2.0  --   --    Liver Function Tests:  Recent Labs Lab 02/12/17 2258  AST 21  ALT 11*  ALKPHOS 66  BILITOT 0.3  PROT 6.2*  ALBUMIN 3.3*   No results for input(s): LIPASE, AMYLASE in the last 168 hours. No results for input(s): AMMONIA in the last 168 hours. CBC:  Recent Labs Lab 02/12/17 2258 02/14/17 0337  WBC 7.3 5.9  NEUTROABS 5.4 3.7  HGB 11.6* 12.4  HCT 36.7 38.5  MCV 88.9 87.7  PLT 157 165   Cardiac Enzymes: No results for input(s): CKTOTAL, CKMB, CKMBINDEX, TROPONINI in the last 168 hours. BNP: Invalid input(s): POCBNP CBG:  Recent Labs Lab 02/13/17 0509 02/14/17 0606 02/15/17 0659  GLUCAP 96 95 75   D-Dimer  Recent Labs  02/12/17 2258  DDIMER 0.50   Hgb A1c No results for input(s): HGBA1C in the last 72 hours. Lipid Profile No results for input(s): CHOL, HDL, LDLCALC, TRIG, CHOLHDL, LDLDIRECT in the last 72 hours. Thyroid function studies  Recent Labs  02/13/17 0505  TSH 1.807   Anemia work up No results for input(s): VITAMINB12, FOLATE, FERRITIN, TIBC, IRON, RETICCTPCT in the last 72 hours. Urinalysis    Component Value Date/Time   COLORURINE YELLOW 02/17/2016 2115   APPEARANCEUR CLEAR 02/17/2016 2115   LABSPEC 1.019 02/17/2016 2115   PHURINE 5.5 02/17/2016 2115   GLUCOSEU NEGATIVE 02/17/2016 2115   HGBUR NEGATIVE 02/17/2016 2115   BILIRUBINUR NEGATIVE  02/17/2016 2115   KETONESUR NEGATIVE 02/17/2016 2115   PROTEINUR NEGATIVE 02/17/2016 2115   NITRITE NEGATIVE 02/17/2016 2115   LEUKOCYTESUR NEGATIVE 02/17/2016 2115   Sepsis Labs Invalid input(s): PROCALCITONIN,  WBC,  LACTICIDVEN Microbiology Recent Results (from the past 240 hour(s))  MRSA PCR Screening     Status: None   Collection Time: 02/13/17  4:23 AM  Result Value Ref Range Status   MRSA by PCR NEGATIVE NEGATIVE Final    Comment:        The GeneXpert MRSA Assay (FDA approved for NASAL specimens only), is one component of a comprehensive MRSA colonization surveillance program.  It is not intended to diagnose MRSA infection nor to guide or monitor treatment for MRSA infections.    Time coordinating discharge: 33 mins  SIGNED:  Standley Dakinslanford Johnson, MD  [facility] 02/15/2017, 12:50 PM Pager [phone]  If 7PM-7AM, please contact night-coverage [email or web address] Password [email or web address]

## 2017-02-15 NOTE — Progress Notes (Signed)
Patient will discharge to [facility] Anticipated discharge date: 7/6 Family notified: Velna HatchetSheila (dtr)- left message Transportation by [facility]- scheduled for 2:30pm  CSW signing off.  Burna SisJenna H. Uris, LCSW Clinical Social Worker [phone]

## 2017-02-15 NOTE — Progress Notes (Signed)
Report provided to [facility].

## 2017-02-16 DIAGNOSIS — Z79899 Other long term (current) drug therapy: Secondary | ICD-10-CM | POA: Diagnosis not present

## 2017-02-16 DIAGNOSIS — Z0000 Encounter for general adult medical examination without abnormal findings: Secondary | ICD-10-CM | POA: Diagnosis not present

## 2017-02-16 DIAGNOSIS — E041 Nontoxic single thyroid nodule: Secondary | ICD-10-CM | POA: Diagnosis not present

## 2017-02-16 DIAGNOSIS — F209 Schizophrenia, unspecified: Secondary | ICD-10-CM | POA: Diagnosis not present

## 2017-02-16 DIAGNOSIS — Z471 Aftercare following joint replacement surgery: Secondary | ICD-10-CM | POA: Diagnosis not present

## 2017-02-16 DIAGNOSIS — S42009A Fracture of unspecified part of unspecified clavicle, initial encounter for closed fracture: Secondary | ICD-10-CM | POA: Diagnosis not present

## 2017-02-16 DIAGNOSIS — F0390 Unspecified dementia without behavioral disturbance: Secondary | ICD-10-CM | POA: Diagnosis not present

## 2017-02-16 DIAGNOSIS — I2690 Septic pulmonary embolism without acute cor pulmonale: Secondary | ICD-10-CM | POA: Diagnosis not present

## 2017-02-16 DIAGNOSIS — S42125D Nondisplaced fracture of acromial process, left shoulder, subsequent encounter for fracture with routine healing: Secondary | ICD-10-CM | POA: Diagnosis not present

## 2017-02-16 DIAGNOSIS — S42122S Displaced fracture of acromial process, left shoulder, sequela: Secondary | ICD-10-CM | POA: Diagnosis not present

## 2017-02-16 DIAGNOSIS — S6980XA Other specified injuries of unspecified wrist, hand and finger(s), initial encounter: Secondary | ICD-10-CM | POA: Diagnosis not present

## 2017-02-16 DIAGNOSIS — Z96611 Presence of right artificial shoulder joint: Secondary | ICD-10-CM | POA: Diagnosis not present

## 2017-02-16 DIAGNOSIS — Z8673 Personal history of transient ischemic attack (TIA), and cerebral infarction without residual deficits: Secondary | ICD-10-CM | POA: Diagnosis not present

## 2017-02-16 DIAGNOSIS — E785 Hyperlipidemia, unspecified: Secondary | ICD-10-CM | POA: Diagnosis not present

## 2017-02-16 DIAGNOSIS — I2782 Chronic pulmonary embolism: Secondary | ICD-10-CM | POA: Diagnosis not present

## 2017-02-16 DIAGNOSIS — R4182 Altered mental status, unspecified: Secondary | ICD-10-CM | POA: Diagnosis not present

## 2017-02-16 DIAGNOSIS — N183 Chronic kidney disease, stage 3 (moderate): Secondary | ICD-10-CM | POA: Diagnosis not present

## 2017-02-16 DIAGNOSIS — R262 Difficulty in walking, not elsewhere classified: Secondary | ICD-10-CM | POA: Diagnosis not present

## 2017-02-16 DIAGNOSIS — R1311 Dysphagia, oral phase: Secondary | ICD-10-CM | POA: Diagnosis not present

## 2017-02-16 DIAGNOSIS — F0280 Dementia in other diseases classified elsewhere without behavioral disturbance: Secondary | ICD-10-CM | POA: Diagnosis not present

## 2017-02-16 DIAGNOSIS — Z96612 Presence of left artificial shoulder joint: Secondary | ICD-10-CM | POA: Diagnosis not present

## 2017-02-16 DIAGNOSIS — G9349 Other encephalopathy: Secondary | ICD-10-CM | POA: Diagnosis not present

## 2017-02-16 DIAGNOSIS — J449 Chronic obstructive pulmonary disease, unspecified: Secondary | ICD-10-CM | POA: Diagnosis not present

## 2017-02-16 DIAGNOSIS — R55 Syncope and collapse: Secondary | ICD-10-CM | POA: Diagnosis not present

## 2017-02-16 DIAGNOSIS — S42122D Displaced fracture of acromial process, left shoulder, subsequent encounter for fracture with routine healing: Secondary | ICD-10-CM | POA: Diagnosis not present

## 2017-02-16 DIAGNOSIS — I639 Cerebral infarction, unspecified: Secondary | ICD-10-CM | POA: Diagnosis not present

## 2017-02-16 DIAGNOSIS — F419 Anxiety disorder, unspecified: Secondary | ICD-10-CM | POA: Diagnosis not present

## 2017-02-16 DIAGNOSIS — S42125A Nondisplaced fracture of acromial process, left shoulder, initial encounter for closed fracture: Secondary | ICD-10-CM | POA: Diagnosis not present

## 2017-02-16 DIAGNOSIS — D649 Anemia, unspecified: Secondary | ICD-10-CM | POA: Diagnosis not present

## 2017-02-16 DIAGNOSIS — M6281 Muscle weakness (generalized): Secondary | ICD-10-CM | POA: Diagnosis not present

## 2017-02-16 DIAGNOSIS — M25512 Pain in left shoulder: Secondary | ICD-10-CM | POA: Diagnosis not present

## 2017-02-16 DIAGNOSIS — R279 Unspecified lack of coordination: Secondary | ICD-10-CM | POA: Diagnosis not present

## 2017-02-16 DIAGNOSIS — I10 Essential (primary) hypertension: Secondary | ICD-10-CM | POA: Diagnosis not present

## 2017-02-16 DIAGNOSIS — I6930 Unspecified sequelae of cerebral infarction: Secondary | ICD-10-CM | POA: Diagnosis not present

## 2017-02-16 DIAGNOSIS — K8689 Other specified diseases of pancreas: Secondary | ICD-10-CM | POA: Diagnosis not present

## 2017-02-16 DIAGNOSIS — R1310 Dysphagia, unspecified: Secondary | ICD-10-CM | POA: Diagnosis not present

## 2017-02-16 MED ORDER — HYDROCODONE-ACETAMINOPHEN 5-325 MG PO TABS
1.0000 | ORAL_TABLET | Freq: Once | ORAL | Status: AC
Start: 2017-02-16 — End: 2017-02-16
  Administered 2017-02-16: 1 via ORAL
  Filled 2017-02-16: qty 1

## 2017-02-16 MED ORDER — ACETAMINOPHEN 325 MG PO TABS
650.0000 mg | ORAL_TABLET | Freq: Once | ORAL | Status: AC
  Administered 2017-02-16: 650 mg via ORAL
  Filled 2017-02-16: qty 2

## 2017-02-16 NOTE — ED Notes (Signed)
[facility] arrived and took pt nursing home.

## 2017-02-16 NOTE — Discharge Instructions (Signed)
Wear the sling for the acromial fracture and follow up with orthopedics. Return to the ED if you develop new or worsening symptoms.

## 2017-02-16 NOTE — ED Notes (Signed)
Pt started to act more agitated and wanting to move around more. MD notified

## 2017-02-16 NOTE — Progress Notes (Signed)
Orthopedic Tech Progress Note Patient Details:  Roberta BeringDorothy Griffin 12/04/1925 [id number]  Ortho Devices Type of Ortho Device: Arm sling Ortho Device/Splint Location: lue Ortho Device/Splint Interventions: Ordered, Application, Adjustment   Trinna PostMartinez, Manuel J 02/16/2017, 12:52 AM

## 2017-02-16 NOTE — ED Notes (Signed)
Pt ambulated well in the room only complaint was shoulder pain no other complaints noted at this time

## 2017-02-18 ENCOUNTER — Non-Acute Institutional Stay (SKILLED_NURSING_FACILITY): Payer: Medicare Other | Admitting: Adult Health

## 2017-02-18 ENCOUNTER — Encounter: Payer: Self-pay | Admitting: Adult Health

## 2017-02-18 ENCOUNTER — Non-Acute Institutional Stay (SKILLED_NURSING_FACILITY): Payer: Medicare Other

## 2017-02-18 DIAGNOSIS — Z0000 Encounter for general adult medical examination without abnormal findings: Secondary | ICD-10-CM | POA: Diagnosis not present

## 2017-02-18 DIAGNOSIS — I10 Essential (primary) hypertension: Secondary | ICD-10-CM | POA: Diagnosis not present

## 2017-02-18 DIAGNOSIS — I6930 Unspecified sequelae of cerebral infarction: Secondary | ICD-10-CM | POA: Diagnosis not present

## 2017-02-18 DIAGNOSIS — I2782 Chronic pulmonary embolism: Secondary | ICD-10-CM | POA: Diagnosis not present

## 2017-02-18 DIAGNOSIS — S42122S Displaced fracture of acromial process, left shoulder, sequela: Secondary | ICD-10-CM

## 2017-02-18 DIAGNOSIS — E041 Nontoxic single thyroid nodule: Secondary | ICD-10-CM

## 2017-02-18 DIAGNOSIS — Z8673 Personal history of transient ischemic attack (TIA), and cerebral infarction without residual deficits: Secondary | ICD-10-CM

## 2017-02-18 NOTE — Patient Instructions (Signed)
[name] , Thank you for taking time to come for your Medicare Wellness Visit. I appreciate your ongoing commitment to your health goals. Please review the following plan we discussed and let me know if I can assist you in the future.   Screening recommendations/referrals: Colonoscopy up to date, pt over age 81 Mammogram up to date, pt over age 81 Bone Density excluded. Recommended yearly ophthalmology/optometry visit for glaucoma screening and checkup Recommended yearly dental visit for hygiene and checkup  Vaccinations: Influenza vaccine up to date. Due 05/20/17 Pneumococcal vaccine 13 due, facility declines Tdap vaccine due, facility declines Shingles vaccine not in records  Advanced directives: DNR in chart, need rest of advanced directives for chart  Conditions/risks identified: None  Next appointment: Dr. Montez Moritaarter makes rounds   Preventive Care 65 Years and Older, Female Preventive care refers to lifestyle choices and visits with your health care provider that can promote health and wellness. What does preventive care include?  A yearly physical exam. This is also called an annual well check.  Dental exams once or twice a year.  Routine eye exams. Ask your health care provider how often you should have your eyes checked.  Personal lifestyle choices, including:  Daily care of your teeth and gums.  Regular physical activity.  Eating a healthy diet.  Avoiding tobacco and drug use.  Limiting alcohol use.  Practicing safe sex.  Taking low-dose aspirin every day.  Taking vitamin and mineral supplements as recommended by your health care provider. What happens during an annual well check? The services and screenings done by your health care provider during your annual well check will depend on your age, overall health, lifestyle risk factors, and family history of disease. Counseling  Your health care provider may ask you questions about your:  Alcohol  use.  Tobacco use.  Drug use.  Emotional well-being.  Home and relationship well-being.  Sexual activity.  Eating habits.  History of falls.  Memory and ability to understand (cognition).  Work and work Astronomerenvironment.  Reproductive health. Screening  You may have the following tests or measurements:  Height, weight, and BMI.  Blood pressure.  Lipid and cholesterol levels. These may be checked every 5 years, or more frequently if you are over 81 years old.  Skin check.  Lung cancer screening. You may have this screening every year starting at age 355 if you have a 30-pack-year history of smoking and currently smoke or have quit within the past 15 years.  Fecal occult blood test (FOBT) of the stool. You may have this test every year starting at age 81.  Flexible sigmoidoscopy or colonoscopy. You may have a sigmoidoscopy every 5 years or a colonoscopy every 10 years starting at age 81.  Hepatitis C blood test.  Hepatitis B blood test.  Sexually transmitted disease (STD) testing.  Diabetes screening. This is done by checking your blood sugar (glucose) after you have not eaten for a while (fasting). You may have this done every 1-3 years.  Bone density scan. This is done to screen for osteoporosis. You may have this done starting at age 81.  Mammogram. This may be done every 1-2 years. Talk to your health care provider about how often you should have regular mammograms. Talk with your health care provider about your test results, treatment options, and if necessary, the need for more tests. Vaccines  Your health care provider may recommend certain vaccines, such as:  Influenza vaccine. This is recommended every year.  Tetanus,  diphtheria, and acellular pertussis (Tdap, Td) vaccine. You may need a Td booster every 10 years.  Zoster vaccine. You may need this after age 37.  Pneumococcal 13-valent conjugate (PCV13) vaccine. One dose is recommended after age  61.  Pneumococcal polysaccharide (PPSV23) vaccine. One dose is recommended after age 79. Talk to your health care provider about which screenings and vaccines you need and how often you need them. This information is not intended to replace advice given to you by your health care provider. Make sure you discuss any questions you have with your health care provider. Document Released: 08/26/2015 Document Revised: 04/18/2016 Document Reviewed: 05/31/2015 Elsevier Interactive Patient Education  2017 [place] Prevention in the Home Falls can cause injuries. They can happen to people of all ages. There are many things you can do to make your home safe and to help prevent falls. What can I do on the outside of my home?  Regularly fix the edges of walkways and driveways and fix any cracks.  Remove anything that might make you trip as you walk through a door, such as a raised step or threshold.  Trim any bushes or trees on the path to your home.  Use bright outdoor lighting.  Clear any walking paths of anything that might make someone trip, such as rocks or tools.  Regularly check to see if handrails are loose or broken. Make sure that both sides of any steps have handrails.  Any raised decks and porches should have guardrails on the edges.  Have any leaves, snow, or ice cleared regularly.  Use sand or salt on walking paths during winter.  Clean up any spills in your garage right away. This includes oil or grease spills. What can I do in the bathroom?  Use night lights.  Install grab bars by the toilet and in the tub and shower. Do not use towel bars as grab bars.  Use non-skid mats or decals in the tub or shower.  If you need to sit down in the shower, use a plastic, non-slip stool.  Keep the floor dry. Clean up any water that spills on the floor as soon as it happens.  Remove soap buildup in the tub or shower regularly.  Attach bath mats securely with double-sided  non-slip rug tape.  Do not have throw rugs and other things on the floor that can make you trip. What can I do in the bedroom?  Use night lights.  Make sure that you have a light by your bed that is easy to reach.  Do not use any sheets or blankets that are too big for your bed. They should not hang down onto the floor.  Have a firm chair that has side arms. You can use this for support while you get dressed.  Do not have throw rugs and other things on the floor that can make you trip. What can I do in the kitchen?  Clean up any spills right away.  Avoid walking on wet floors.  Keep items that you use a lot in easy-to-reach places.  If you need to reach something above you, use a strong step stool that has a grab bar.  Keep electrical cords out of the way.  Do not use floor polish or wax that makes floors slippery. If you must use wax, use non-skid floor wax.  Do not have throw rugs and other things on the floor that can make you trip. What can I do with  my stairs?  Do not leave any items on the stairs.  Make sure that there are handrails on both sides of the stairs and use them. Fix handrails that are broken or loose. Make sure that handrails are as long as the stairways.  Check any carpeting to make sure that it is firmly attached to the stairs. Fix any carpet that is loose or worn.  Avoid having throw rugs at the top or bottom of the stairs. If you do have throw rugs, attach them to the floor with carpet tape.  Make sure that you have a light switch at the top of the stairs and the bottom of the stairs. If you do not have them, ask someone to add them for you. What else can I do to help prevent falls?  Wear shoes that:  Do not have high heels.  Have rubber bottoms.  Are comfortable and fit you well.  Are closed at the toe. Do not wear sandals.  If you use a stepladder:  Make sure that it is fully opened. Do not climb a closed stepladder.  Make sure that both  sides of the stepladder are locked into place.  Ask someone to hold it for you, if possible.  Clearly mark and make sure that you can see:  Any grab bars or handrails.  First and last steps.  Where the edge of each step is.  Use tools that help you move around (mobility aids) if they are needed. These include:  Canes.  Walkers.  Scooters.  Crutches.  Turn on the lights when you go into a dark area. Replace any light bulbs as soon as they burn out.  Set up your furniture so you have a clear path. Avoid moving your furniture around.  If any of your floors are uneven, fix them.  If there are any pets around you, be aware of where they are.  Review your medicines with your doctor. Some medicines can make you feel dizzy. This can increase your chance of falling. Ask your doctor what other things that you can do to help prevent falls. This information is not intended to replace advice given to you by your health care provider. Make sure you discuss any questions you have with your health care provider. Document Released: 05/26/2009 Document Revised: 01/05/2016 Document Reviewed: 09/03/2014 Elsevier Interactive Patient Education  2017 [employer].

## 2017-02-18 NOTE — Progress Notes (Signed)
Location:   [facility] Room Number: 214 B Place of Service:  SNF (31)   CODE STATUS: DNR  No Known Allergies  Chief Complaint  Patient presents with  . Hospitalization Follow-up    Hospital follow up    HPI:  She is a 83 year old long term resident of this facility has been hospitalized after a witnessed syncopal episode.  There are no evidence of infection; neurologic deficits or electrolyte abnormalities. She did not have any events while hospitalized. She was found to have a multi-goiter nodule.  The hospital has recommended a further workup.  After returning to this facility; she suffered a fall on 02-15-17:  with left arm pain. Her x-ray demonstrated a left acromial process fracture. She is sitting in a wheelchair at this time with a sling in place. She is due to see orthopedics in the near future. She tells me that she has left shoulder pain.     Past Medical History:  Diagnosis Date  . COPD (chronic obstructive pulmonary disease) (HCC)   . Dementia   . Depression   . Dysphagia   . Hypertension   . Scoliosis     Past Surgical History:  Procedure Laterality Date  . ABDOMINAL HYSTERECTOMY    . KNEE SURGERY    . SHOULDER SURGERY    . VITRECTOMY AND CATARACT      Social History   Social History  . Marital status: Single    Spouse name: N/A  . Number of children: N/A  . Years of education: N/A   Occupational History  . Not on file.   Social History Main Topics  . Smoking status: Never Smoker  . Smokeless tobacco: Never Used  . Alcohol use No  . Drug use: No  . Sexual activity: Not on file   Other Topics Concern  . Not on file   Social History Narrative  . No narrative on file   Family History  Problem Relation Age of Onset  . Stroke Neg Hx       VITAL SIGNS BP 120/80   Pulse 67   Temp 97.7 F (36.5 C)   Resp 19   Ht 5\' 3"  (1.6 m)   Wt 126 lb (57.2 kg)   SpO2 97%   BMI 22.32 kg/m    Patient's Medications  New  Prescriptions   No medications on file  Previous Medications   ACETAMINOPHEN (TYLENOL) 325 MG TABLET    Take 2 tablets (650 mg total) by mouth every 6 (six) hours as needed for mild pain, moderate pain, fever or headache.   APIXABAN (ELIQUIS) 2.5 MG TABS TABLET    Take 2.5 mg by mouth 2 (two) times daily.   BUSPIRONE (BUSPAR) 15 MG TABLET    Give 1 tablet (15 mg) by mouth daily and 1 tablet by mouth at bedtime   DONEPEZIL (ARICEPT) 10 MG TABLET    Take 1 tablet (10 mg total) by mouth at bedtime.   LIDOCAINE 4 % PTCH    Place 1 patch onto the skin daily. Apply to lower back.  Remove & Discard patch within 12 hours or as directed by MD   MEMANTINE (NAMENDA XR) 28 MG CP24 24 HR CAPSULE    Take 28 mg by mouth daily.   METOPROLOL SUCCINATE (TOPROL-XL) 50 MG 24 HR TABLET    Take 1 tablet (50 mg total) by mouth daily. Take with or immediately following a meal.   RISPERIDONE (RISPERDAL) 0.5 MG TABLET  Take 0.5 mg by mouth at bedtime.  Modified Medications   No medications on file  Discontinued Medications   No medications on file     SIGNIFICANT DIAGNOSTIC EXAMS: previous exams:   02-18-16: carotid ultrasound: Bilateral: intimal wall thickening CCA. Mild soft plaque origin ICA. 1-39% ICA plaquing. Vertebral artery flow is antegrade.  REVIEWED TODAY:   02-12-17: chest x-ray: Cardiomegaly and mild interstitial edema. No focal airspace consolidation.  02-12-17:  ct of head and cervical spine: 1. Chronic small vessel ischemic disease. No acute intracranial abnormality. 2. Small chronic caudate lacunar infarct on the right. 3. Cerebral atrophy. 4. Calcified frontal meningioma.  02-13-17: thyroid ultrasound: Multinodular goiter. Two dominant nodules occupying a large portion of the left thyroid lobe. Both of these nodules meet criteria for biopsy. Recommend clinical correlation with regards to the need for tissue sampling based on patient's age and comorbidities.   02-14-17: ct angio of chest: 1. No  evidence of pulmonary embolus. 2. Mild bibasilar atelectasis or scarring noted. Mild interstitial prominence seen. Mild scarring at the lung apices. 3. Cardiomegaly.  Diffuse coronary artery calcifications seen. 4. Small pericardial effusion. 5. Large left thyroid mass, measuring perhaps 4.4 cm in size. This may be increased in size from 2017. Recommend further evaluation with thyroid ultrasound. If patient is clinically hyperthyroid, consider nuclear medicine thyroid uptake and scan. 6. Scattered aortic atherosclerosis.  02-14-17: TEE: - Left ventricle: Global longitudinal strai is -20.2%. The cavity size was normal. Wall thickness was increased in a pattern of mild LVH. Systolic function was normal. The estimated ejection fraction was in the range of 60% to 65%. Doppler parameters are consistent with abnormal left ventricular relaxation (grade 1  diastolic dysfunction). - Mitral valve: There was mild to moderate regurgitation. - Right atrium: The atrium was mildly dilated. - Tricuspid valve: There was mild-moderate regurgitation. - Pulmonary arteries: PA peak pressure: 40 mm Hg (S). - Pericardium, extracardiac: A trivial pericardial effusion was  identified.   02-15-17 ct head and cervical spine: 1. No acute intracranial abnormality. No skull fracture. Stable atrophy, chronic and remote ischemia. 2. Chronic and degenerative change in the cervical spine without evidence of acute fracture  02-15-17: left shoulder x-ray: Apparent mildly displaced fracture of the left acromion on a single view. Would correlate for associated symptoms.  02-15-17: left elbow: No evidence of fracture or dislocation.  02-15-17: pelvic x-ray: No evidence of fracture or dislocation.   LABS REVIEWED: previous labs   02-17-16: wbc 7.0; hgb 11.9; hct 36.1; mcv 88.5; plt 157; glucose 93; bun 14; creat 1.19; k+ 3.5; na++ 138; liver normal albumin 3.5; urine culture: multiple species 02-18-16; hgb a1c 5.6; chol 214; ldl 142; trig 90;  hdl 54; vit B 12: 353; ammonia 22; tsh 1.211; RPR: nr; HIV: nr 02-20-16; wbc 9.2; hgb 13.2; hct 39.9; mcv 87.9; plt 172; glucose 100; bun 8; creat 0.94; k+ 4.0; na++ 140; mag 1.6  04-14-16; wbc 8.2; hgb 12.7; hct 41.2; mcv 88.2; plt 244; glucose 106; bun 13.1; create 0.92; k+ 3.8; na++ 146; liver normal albumin 3.8 mag 2.1    10-17-16; wbc 5.2; hgb 14.0; hct 44.3; mcv 89.1; plt 217; glucose 126; bun 21.6; creat 1.03; k+ 4.0; na++ 145; liver normal albumin 4.1; chol 193; ldl 116; trig 75; hdl 62; hgb a1c 6.3  12-06-16: wbc 7.1; hgb 13.8; hct 43.2 mcv 89.9; plt 219; glucose 123; bun 27.1; creat 1.23; k+ 4.5 ;na++ 144 ca 9.8   reviewed today  02-12-17: wbc 7.3; hgb 11.6;  hct 36.7; mcv 88.7; plt 157; glucose 108; bun 21; creat 1.45; k+ 3.6; na+ 143; ca 9.0; liver normal albumin 3.3; d-dimer 0.50; mag 2.0 02-13-17: glucose 101; bun 17; creat 1.22; k+ 3.7; na++ 143; ca 9.0; tsh 1.807 02-14-17: wbc 5.9; hgb 12.4; hct 38.5; mcv 87.7; plt 165; glucose 99; bun 15; creat 1.01; k+ 3.7; na++ 143; ca 8.8     Review of Systems  Constitutional: Negative for malaise/fatigue.  Respiratory: Negative for cough and shortness of breath.   Cardiovascular: Negative for chest pain, palpitations and leg swelling.  Gastrointestinal: Negative for abdominal pain, constipation and heartburn.  Musculoskeletal: Positive for joint pain and myalgias. Negative for back pain.       Has left arm and shoulder  pain   Skin: Negative.   Neurological: Negative for dizziness.  Psychiatric/Behavioral: The patient is not nervous/anxious.     Physical Exam  Constitutional: No distress.  Eyes: Conjunctivae are normal.  Neck: Neck supple. No JVD present. No thyromegaly present.  Cardiovascular: Normal rate, regular rhythm and intact distal pulses.   Respiratory: Effort normal and breath sounds normal. No respiratory distress. She has no wheezes.  GI: Soft. Bowel sounds are normal. She exhibits no distension. There is no tenderness.    Musculoskeletal: She exhibits no edema.  Able to move all extremities  Has left arm in sling Has limited ROM in left shoulder; is resistant to movement   Lymphadenopathy:    She has no cervical adenopathy.  Neurological: She is alert.  Skin: Skin is warm and dry. She is not diaphoretic.  Psychiatric: She has a normal mood and affect.    ASSESSMENT/ PLAN:  FOR TODAY   1. Left acromial process fracture: is without change in her status; will continue to use sling; will continue therapy as directed; and will follow up with orthopedics    2. Chronic pulmonary embolism: her respiratory status is stable will continue eliquis 2.5 mg twice daily  3. Chronic arterial  ischemic stroke: is neurology stable will continue eliquis 2.5 mg twice daily   4. Hypertension: b/p 120/80 is stable will continue toprol xl 50 mg daily  5. Thyroid nodule: her tsh is normal at 1.807: due to her advanced age and advanced dementia: I have spoken with Dr. Montez Moritaarter; will not proceed with any further workup as she will not be able to participate in the workup. Will continue to monitor her status.    CHRONIC PROBLEMS   4. COPD: is stable is currently not on medications will continue to monitor her status.   5. Vascular dementia: she is without change in her status; her weight is 126 pounds: will continue aricept 10 mg nightly and namenda xr 28 mg daily will monitor   6. CKD stage III: is stable bun 15; creat 1.01 will monitor   7. Anxiety: she is emotionally stable will continue buspar 15 mg twice daily   8. Psychosis: she is currently without obvious hallucinations; will continue risperdal 0.5 mg nightly   9. Left acromial process fracture: is without change in her status; will continue to use sling; will continue therapy as directed; and will follow up with orthopedics    Will continue to periodically monitor her thyroid labs      Synthia Innocenteborah Green NP [facility]  Contact [phone] Monday  through Friday 8am- 5pm  After hours call [phone]

## 2017-02-18 NOTE — Progress Notes (Signed)
Subjective:   Roberta Griffin is a 83 y.o. female who presents for Medicare Annual (Subsequent) preventive examination (Last AWV: 01/2016) at [facility]; incapacitated patient unable to answer questions appropriatley    Objective:     Vitals: BP (!) 122/58 (BP Location: Left Arm, Patient Position: Sitting)   Pulse 67   Temp (!) 97.2 F (36.2 C) (Oral)   Ht 5\' 6"  (1.676 m)   Wt 130 lb (59 kg)   SpO2 98%   BMI 20.98 kg/m   Body mass index is 20.98 kg/m.   Tobacco History  Smoking Status  . Never Smoker  Smokeless Tobacco  . Never Used     Counseling given: Not Answered   Past Medical History:  Diagnosis Date  . COPD (chronic obstructive pulmonary disease) (HCC)   . Dementia   . Depression   . Dysphagia   . Hypertension   . Scoliosis    Past Surgical History:  Procedure Laterality Date  . ABDOMINAL HYSTERECTOMY    . KNEE SURGERY    . SHOULDER SURGERY    . VITRECTOMY AND CATARACT     Family History  Problem Relation Age of Onset  . Stroke Neg Hx    History  Sexual Activity  . Sexual activity: Not on file    Outpatient Encounter Prescriptions as of 02/18/2017  Medication Sig  . acetaminophen (TYLENOL) 325 MG tablet Take 2 tablets (650 mg total) by mouth every 6 (six) hours as needed for mild pain, moderate pain, fever or headache.  Marland Kitchen apixaban (ELIQUIS) 2.5 MG TABS tablet Take 2.5 mg by mouth 2 (two) times daily.  . busPIRone (BUSPAR) 15 MG tablet Give 1 tablet (15 mg) by mouth daily and 1 tablet by mouth at bedtime  . donepezil (ARICEPT) 10 MG tablet Take 1 tablet (10 mg total) by mouth at bedtime.  . Lidocaine 4 % PTCH Place 1 patch onto the skin daily. Apply to lower back.  Remove & Discard patch within 12 hours or as directed by MD  . memantine (NAMENDA XR) 28 MG CP24 24 hr capsule Take 28 mg by mouth daily.  . metoprolol succinate (TOPROL-XL) 50 MG 24 hr tablet Take 1 tablet (50 mg total) by mouth daily. Take with or immediately following a  meal.  . risperiDONE (RISPERDAL) 0.5 MG tablet Take 0.5 mg by mouth at bedtime.   No facility-administered encounter medications on file as of 02/18/2017.     Activities of Daily Living In your present state of health, do you have any difficulty performing the following activities: 02/18/2017 02/13/2017  Hearing? Y N  Vision? N N  Difficulty concentrating or making decisions? Malvin Johns  Walking or climbing stairs? Y Y  Dressing or bathing? Y Y  Doing errands, shopping? Malvin Johns  Preparing Food and eating ? Y -  Using the Toilet? Y -  In the past six months, have you accidently leaked urine? Y -  Do you have problems with loss of bowel control? Y -  Managing your Medications? Y -  Managing your Finances? Y -  Housekeeping or managing your Housekeeping? Y -  Some recent data might be hidden    Patient Care Team: Patient, No Pcp Per as PCP - General (General Practice)    Assessment:     Exercise Activities and Dietary recommendations Current Exercise Habits: The patient does not participate in regular exercise at present, Exercise limited by: neurologic condition(s)  Goals    None  Fall Risk Fall Risk  02/18/2017  Falls in the past year? Yes  Number falls in past yr: 1  Injury with Fall? Yes   Depression Screen PHQ 2/9 Scores 02/18/2017  PHQ - 2 Score 0     Cognitive Function     6CIT Screen 02/18/2017  What Year? 4 points  What month? 3 points  What time? 3 points  Count back from 20 0 points  Months in reverse 4 points  Repeat phrase 10 points  Total Score 24    Immunization History  Administered Date(s) Administered  . Influenza-Unspecified 05/20/2016  . PPD Test 02/24/2016, 05/04/2016, 05/11/2016   Screening Tests Health Maintenance  Topic Date Due  . TETANUS/TDAP  06/20/2040 (Originally 01/15/1945)  . PNA vac Low Risk Adult (1 of 2 - PCV13) 06/20/2040 (Originally 01/14/1991)  . INFLUENZA VACCINE  03/13/2017  . DEXA SCAN  Excluded      Plan:    I have personally  reviewed and addressed the Medicare Annual Wellness questionnaire and have noted the following in the patient's chart:  A. Medical and social history B. Use of alcohol, tobacco or illicit drugs  C. Current medications and supplements D. Functional ability and status E.  Nutritional status F.  Physical activity G. Advance directives H. List of other physicians I.  Hospitalizations, surgeries, and ER visits in previous 12 months J.  Vitals K. Screenings to include hearing, vision, cognitive, depression L. Referrals and appointments - none  In addition, unable to review and discuss with incapacitated patient certain preventive protocols, quality metrics, and best practice recommendations. A written personalized care plan for preventive services as well as general preventive health recommendations were provided to patient.  See attached scanned questionnaire for additional information.   Signed,   Annetta MawSara Gonthier, RN Nurse Health Advisor   Quick Notes   Health Maintenance: TDAP, PNA 13 due     Abnormal Screen: 6 Cit-24     Patient Concerns: Broken dentures, will order dental consult     Nurse Concerns: None

## 2017-02-20 ENCOUNTER — Ambulatory Visit (INDEPENDENT_AMBULATORY_CARE_PROVIDER_SITE_OTHER): Payer: Medicare Other | Admitting: Family

## 2017-02-20 ENCOUNTER — Encounter (INDEPENDENT_AMBULATORY_CARE_PROVIDER_SITE_OTHER): Payer: Self-pay | Admitting: Family

## 2017-02-20 VITALS — Ht 66.0 in | Wt 130.0 lb

## 2017-02-20 DIAGNOSIS — M25512 Pain in left shoulder: Secondary | ICD-10-CM

## 2017-02-20 DIAGNOSIS — I639 Cerebral infarction, unspecified: Secondary | ICD-10-CM

## 2017-02-20 NOTE — Progress Notes (Signed)
Office Visit Note   Patient: Roberta BeringDorothy Fielden           Date of Birth: 02/04/1926           MRN: [id number] Visit Date: 02/20/2017              Requested by: No referring provider defined for this encounter. PCP: Patient, No Pcp Per  Chief Complaint  Patient presents with  . Left Shoulder - Follow-up    Acromial fracture DOI 02/15/17      HPI: The patient is an 83 year old woman who presents today for evaluation of a left shoulder injury. She fell on June 6. The aid that accompanies the visit today states that he witnessed her fall. She fell directly on her posterior shoulder. Today the patient is minimally verbal. She is difficult to examine. Pain is poorly localized.  Assessment & Plan: Visit Diagnoses:  1. Acute pain of left shoulder     Plan: Reassurance provided there was no fracture to the left shoulder. Does have chronic elevation of her left clavicle. Recommended him activities as tolerated with the left upper extremity. Discontinue the sling. Physical therapy may continue working with the patient. If having continued pain or any concerns to follow up in 4 weeks.  Follow-Up Instructions: Return in about 4 weeks (around 03/20/2017), or if symptoms worsen or fail to improve.   Left Shoulder Exam   Tenderness  Left shoulder tenderness location: Globally tender.  Range of Motion  Active Abduction:  40 abnormal  Passive Abduction: 90  Forward Flexion: 90   Other  Erythema: absent Pulse: present       Patient is alert, oriented, no adenopathy, well-dressed, normal affect, normal respiratory effort.   Imaging: No results found.  Labs: Lab Results  Component Value Date   HGBA1C 5.6 02/18/2016   REPTSTATUS 02/19/2016 FINAL 02/17/2016   CULT MULTIPLE SPECIES PRESENT, SUGGEST RECOLLECTION (A) 02/17/2016    Orders:  No orders of the defined types were placed in this encounter.  No orders of the defined types were placed in this encounter.    Procedures: No  procedures performed  Clinical Data: No additional findings.  ROS:  All other systems negative, except as noted in the HPI. Review of Systems  Constitutional: Negative for chills and fever.  Musculoskeletal: Positive for arthralgias. Negative for neck pain.  Neurological: Negative for weakness and numbness.    Objective: Vital Signs: Ht 5\' 6"  (1.676 m)   Wt 130 lb (59 kg)   BMI 20.98 kg/m   Specialty Comments:  No specialty comments available.  PMFS History: Patient Active Problem List   Diagnosis Date Noted  . Syncope 02/13/2017  . Chronic arterial ischemic stroke 12/05/2016  . Chronic pulmonary embolism (HCC) 12/05/2016  . Chronic diastolic CHF (congestive heart failure) (HCC) 03/04/2016  . Insomnia 03/04/2016  . Dysarthria 03/04/2016  . Pericardial effusion 03/04/2016  . Thyroid nodule 03/04/2016  . Pancreatic mass   . HLD (hyperlipidemia)   . Dysphagia 02/18/2016  . COPD (chronic obstructive pulmonary disease) (HCC) 02/17/2016  . Dementia 02/17/2016  . Essential hypertension 02/17/2016  . Normocytic anemia 02/17/2016  . CKD (chronic kidney disease), stage III 02/17/2016   Past Medical History:  Diagnosis Date  . COPD (chronic obstructive pulmonary disease) (HCC)   . Dementia   . Depression   . Dysphagia   . Hypertension   . Scoliosis     Family History  Problem Relation Age of Onset  . Stroke Neg Hx  Past Surgical History:  Procedure Laterality Date  . ABDOMINAL HYSTERECTOMY    . KNEE SURGERY    . SHOULDER SURGERY    . VITRECTOMY AND CATARACT     Social History   Occupational History  . Not on file.   Social History Main Topics  . Smoking status: Never Smoker  . Smokeless tobacco: Never Used  . Alcohol use No  . Drug use: No  . Sexual activity: Not on file

## 2017-02-27 ENCOUNTER — Non-Acute Institutional Stay (SKILLED_NURSING_FACILITY): Payer: Medicare Other | Admitting: Adult Health

## 2017-02-27 ENCOUNTER — Encounter: Payer: Self-pay | Admitting: Adult Health

## 2017-02-27 DIAGNOSIS — S42122S Displaced fracture of acromial process, left shoulder, sequela: Secondary | ICD-10-CM

## 2017-02-27 NOTE — Progress Notes (Signed)
Location:   [facility] Room Number: 214 B Place of Service:  SNF (31)   CODE STATUS: DNR  No Known Allergies  Chief Complaint  Patient presents with  . Acute Visit    Left shoulder swelling    HPI:  Therapy reports that she has swelling on her left shoulder which as noticed today. Does have pain present with movement of either shoulder. She is a somewhat poor historian and tell me that her shoulders hurt all the time. Nursing staff has been giving her tylenol as needed without relief of her pain.   Past Medical History:  Diagnosis Date  . COPD (chronic obstructive pulmonary disease) (HCC)   . Dementia   . Depression   . Dysphagia   . Hypertension   . Scoliosis     Past Surgical History:  Procedure Laterality Date  . ABDOMINAL HYSTERECTOMY    . KNEE SURGERY    . SHOULDER SURGERY    . VITRECTOMY AND CATARACT      Social History   Social History  . Marital status: Single    Spouse name: N/A  . Number of children: N/A  . Years of education: N/A   Occupational History  . Not on file.   Social History Main Topics  . Smoking status: Never Smoker  . Smokeless tobacco: Never Used  . Alcohol use No  . Drug use: No  . Sexual activity: Not on file   Other Topics Concern  . Not on file   Social History Narrative  . No narrative on file   Family History  Problem Relation Age of Onset  . Stroke Neg Hx       VITAL SIGNS BP 138/78   Pulse 68   Temp 98.9 F (37.2 C)   Resp 18   Ht 5\' 1"  (1.549 m)   Wt 126 lb 12.8 oz (57.5 kg)   SpO2 95%   BMI 23.96 kg/m   Patient's Medications  New Prescriptions   No medications on file  Previous Medications   ACETAMINOPHEN (TYLENOL) 325 MG TABLET    Take 2 tablets (650 mg total) by mouth every 6 (six) hours as needed for mild pain, moderate pain, fever or headache.   APIXABAN (ELIQUIS) 2.5 MG TABS TABLET    Take 2.5 mg by mouth 2 (two) times daily.   BUSPIRONE (BUSPAR) 15 MG TABLET    Give 7.5 mg by  mouth one time a day and 15 mg by mouth at bedtime   DONEPEZIL (ARICEPT) 10 MG TABLET    Take 1 tablet (10 mg total) by mouth at bedtime.   LIDOCAINE 4 % PTCH    Place 1 patch onto the skin daily. Apply to lower back.  Remove & Discard patch within 12 hours or as directed by MD   MEMANTINE (NAMENDA XR) 28 MG CP24 24 HR CAPSULE    Take 28 mg by mouth daily.   METOPROLOL SUCCINATE (TOPROL-XL) 50 MG 24 HR TABLET    Take 1 tablet (50 mg total) by mouth daily. Take with or immediately following a meal.   RISPERIDONE (RISPERDAL) 0.5 MG TABLET    Take 0.5 mg by mouth at bedtime.  Modified Medications   No medications on file  Discontinued Medications   No medications on file     SIGNIFICANT DIAGNOSTIC EXAMS   02-18-16: carotid ultrasound: Bilateral: intimal wall thickening CCA. Mild soft plaque origin ICA. 1-39% ICA plaquing. Vertebral artery flow is antegrade.  02-12-17: chest x-ray:  Cardiomegaly and mild interstitial edema. No focal airspace consolidation.  02-12-17:  ct of head and cervical spine: 1. Chronic small vessel ischemic disease. No acute intracranial abnormality. 2. Small chronic caudate lacunar infarct on the right. 3. Cerebral atrophy. 4. Calcified frontal meningioma.  02-13-17: thyroid ultrasound: Multinodular goiter. Two dominant nodules occupying a large portion of the left thyroid lobe. Both of these nodules meet criteria for biopsy. Recommend clinical correlation with regards to the need for tissue sampling based on patient's age and comorbidities.   02-14-17: ct angio of chest: 1. No evidence of pulmonary embolus. 2. Mild bibasilar atelectasis or scarring noted. Mild interstitial prominence seen. Mild scarring at the lung apices. 3. Cardiomegaly.  Diffuse coronary artery calcifications seen. 4. Small pericardial effusion. 5. Large left thyroid mass, measuring perhaps 4.4 cm in size. This may be increased in size from 2017. Recommend further evaluation with thyroid ultrasound. If  patient is clinically hyperthyroid, consider nuclear medicine thyroid uptake and scan. 6. Scattered aortic atherosclerosis.  02-14-17: TEE: - Left ventricle: Global longitudinal strai is -20.2%. The cavity size was normal. Wall thickness was increased in a pattern of mild LVH. Systolic function was normal. The estimated ejection fraction was in the range of 60% to 65%. Doppler parameters are consistent with abnormal left ventricular relaxation (grade 1  diastolic dysfunction). - Mitral valve: There was mild to moderate regurgitation. - Right atrium: The atrium was mildly dilated. - Tricuspid valve: There was mild-moderate regurgitation. - Pulmonary arteries: PA peak pressure: 40 mm Hg (S). - Pericardium, extracardiac: A trivial pericardial effusion was  identified.   02-15-17 ct head and cervical spine: 1. No acute intracranial abnormality. No skull fracture. Stable atrophy, chronic and remote ischemia. 2. Chronic and degenerative change in the cervical spine without evidence of acute fracture  02-15-17: left shoulder x-ray: Apparent mildly displaced fracture of the left acromion on a single view. Would correlate for associated symptoms.  02-15-17: left elbow: No evidence of fracture or dislocation.  02-15-17: pelvic x-ray: No evidence of fracture or dislocation.  NO NEW EXAMS   LABS REVIEWED: previous labs   02-17-16: wbc 7.0; hgb 11.9; hct 36.1; mcv 88.5; plt 157; glucose 93; bun 14; creat 1.19; k+ 3.5; na++ 138; liver normal albumin 3.5; urine culture: multiple species 02-18-16; hgb a1c 5.6; chol 214; ldl 142; trig 90; hdl 54; vit B 12: 353; ammonia 22; tsh 1.211; RPR: nr; HIV: nr 02-20-16; wbc 9.2; hgb 13.2; hct 39.9; mcv 87.9; plt 172; glucose 100; bun 8; creat 0.94; k+ 4.0; na++ 140; mag 1.6  04-14-16; wbc 8.2; hgb 12.7; hct 41.2; mcv 88.2; plt 244; glucose 106; bun 13.1; create 0.92; k+ 3.8; na++ 146; liver normal albumin 3.8 mag 2.1    10-17-16; wbc 5.2; hgb 14.0; hct 44.3; mcv 89.1; plt 217; glucose  126; bun 21.6; creat 1.03; k+ 4.0; na++ 145; liver normal albumin 4.1; chol 193; ldl 116; trig 75; hdl 62; hgb a1c 6.3  12-06-16: wbc 7.1; hgb 13.8; hct 43.2 mcv 89.9; plt 219; glucose 123; bun 27.1; creat 1.23; k+ 4.5 ;na++ 144 ca 9.8  02-12-17: wbc 7.3; hgb 11.6; hct 36.7; mcv 88.7; plt 157; glucose 108; bun 21; creat 1.45; k+ 3.6; na+ 143; ca 9.0; liver normal albumin 3.3; d-dimer 0.50; mag 2.0 02-13-17: glucose 101; bun 17; creat 1.22; k+ 3.7; na++ 143; ca 9.0; tsh 1.807 02-14-17: wbc 5.9; hgb 12.4; hct 38.5; mcv 87.7; plt 165; glucose 99; bun 15; creat 1.01; k+ 3.7; na++ 143; ca 8.8  NO NEW LABS      Review of Systems  Constitutional: Negative for malaise/fatigue.  Respiratory: Negative for cough and shortness of breath.   Cardiovascular: Negative for chest pain, palpitations and leg swelling.  Gastrointestinal: Negative for abdominal pain and constipation.  Musculoskeletal: Positive for joint pain. Negative for back pain and myalgias.       Has shoulder pain   Skin: Negative.   Neurological: Negative for dizziness.  Psychiatric/Behavioral: The patient is not nervous/anxious.      Physical Exam  Constitutional: No distress.  Eyes: Conjunctivae are normal.  Neck: Neck supple. No JVD present. No thyromegaly present.  Cardiovascular: Normal rate, regular rhythm and intact distal pulses.   Respiratory: Effort normal and breath sounds normal. No respiratory distress. She has no wheezes.  GI: Soft. Bowel sounds are normal. She exhibits no distension. There is no tenderness.  Musculoskeletal: She exhibits no edema.  Able to move all extremities  Does have neck contracture  Has limited range of motion to bilateral shoulders is resistant to movement to the left shoulder   Lymphadenopathy:    She has no cervical adenopathy.  Neurological: She is alert.  Skin: Skin is warm and dry. She is not diaphoretic.  Psychiatric: She has a normal mood and affect.     ASSESSMENT/ PLAN:  FOR  TODAY   1. Left acromial process fracture: is slightly worse due to pain; will continue therapy as directed; will begin ultram  50 mg every 6 hours as needed for pain; will repeat x-ray to left shoulder. Will monitor     MD is aware of resident's narcotic use and is in agreement with current plan of care. We will attempt to wean resident as apropriate   Synthia Innocent NP [facility]  Contact [phone] Monday through Friday 8am- 5pm  After hours call [phone]

## 2017-02-28 ENCOUNTER — Non-Acute Institutional Stay (SKILLED_NURSING_FACILITY): Payer: Medicare Other | Admitting: Adult Health

## 2017-02-28 ENCOUNTER — Encounter: Payer: Self-pay | Admitting: Adult Health

## 2017-02-28 ENCOUNTER — Other Ambulatory Visit: Payer: Self-pay | Admitting: *Deleted

## 2017-02-28 DIAGNOSIS — S42122S Displaced fracture of acromial process, left shoulder, sequela: Secondary | ICD-10-CM

## 2017-02-28 NOTE — Progress Notes (Signed)
Location:   [facility] Room Number: 214 B Place of Service:  SNF (31)   CODE STATUS: DNR  No Known Allergies  Chief Complaint  Patient presents with  . Acute Visit    Left shoulder pain    HPI:  She continues to struggle with significant left shoulder pain and today with right shoulder pain . The x-ray 02-28-17 left shoulder left humeral prosthesis no sign degenerative changes no bone abn to demonstrate recent fx or dislo  evidencoe of old trauma to the acromium process with evidence of nonunion. Will need ct of bilateral shoudlers   Past Medical History:  Diagnosis Date  . COPD (chronic obstructive pulmonary disease) (HCC)   . Dementia   . Depression   . Dysphagia   . Hypertension   . Scoliosis     Past Surgical History:  Procedure Laterality Date  . ABDOMINAL HYSTERECTOMY    . KNEE SURGERY    . SHOULDER SURGERY    . VITRECTOMY AND CATARACT      Social History   Social History  . Marital status: Single    Spouse name: N/A  . Number of children: N/A  . Years of education: N/A   Occupational History  . Not on file.   Social History Main Topics  . Smoking status: Never Smoker  . Smokeless tobacco: Never Used  . Alcohol use No  . Drug use: No  . Sexual activity: Not on file   Other Topics Concern  . Not on file   Social History Narrative  . No narrative on file   Family History  Problem Relation Age of Onset  . Stroke Neg Hx       VITAL SIGNS BP 138/78   Pulse 68   Temp 98.9 F (37.2 C)   Resp 18   Ht 5\' 1"  (1.549 m)   Wt 126 lb 12.8 oz (57.5 kg)   SpO2 95%   BMI 23.96 kg/m   Patient's Medications  New Prescriptions   No medications on file  Previous Medications   ACETAMINOPHEN (TYLENOL) 325 MG TABLET    Take 2 tablets (650 mg total) by mouth every 6 (six) hours as needed for mild pain, moderate pain, fever or headache.   APIXABAN (ELIQUIS) 2.5 MG TABS TABLET    Take 2.5 mg by mouth 2 (two) times daily.   BUSPIRONE  (BUSPAR) 15 MG TABLET    Give 7.5 mg by mouth one time a day and 15 mg by mouth at bedtime   DONEPEZIL (ARICEPT) 10 MG TABLET    Take 1 tablet (10 mg total) by mouth at bedtime.   LIDOCAINE 4 % PTCH    Place 1 patch onto the skin daily. Apply to lower back.  Remove & Discard patch within 12 hours or as directed by MD   MEMANTINE (NAMENDA XR) 28 MG CP24 24 HR CAPSULE    Take 28 mg by mouth daily.   METOPROLOL SUCCINATE (TOPROL-XL) 50 MG 24 HR TABLET    Take 1 tablet (50 mg total) by mouth daily. Take with or immediately following a meal.   RISPERIDONE (RISPERDAL) 0.5 MG TABLET    Take 0.5 mg by mouth at bedtime.   TRAMADOL (ULTRAM) 50 MG TABLET    Take 50 mg by mouth every 6 (six) hours as needed.  Modified Medications   No medications on file  Discontinued Medications   No medications on file     SIGNIFICANT DIAGNOSTIC EXAMS   02-18-16:  carotid ultrasound: Bilateral: intimal wall thickening CCA. Mild soft plaque origin ICA. 1-39% ICA plaquing. Vertebral artery flow is antegrade.  02-12-17: chest x-ray: Cardiomegaly and mild interstitial edema. No focal airspace consolidation.  02-12-17:  ct of head and cervical spine: 1. Chronic small vessel ischemic disease. No acute intracranial abnormality. 2. Small chronic caudate lacunar infarct on the right. 3. Cerebral atrophy. 4. Calcified frontal meningioma.  02-13-17: thyroid ultrasound: Multinodular goiter. Two dominant nodules occupying a large portion of the left thyroid lobe. Both of these nodules meet criteria for biopsy. Recommend clinical correlation with regards to the need for tissue sampling based on patient's age and comorbidities.   02-14-17: ct angio of chest: 1. No evidence of pulmonary embolus. 2. Mild bibasilar atelectasis or scarring noted. Mild interstitial prominence seen. Mild scarring at the lung apices. 3. Cardiomegaly.  Diffuse coronary artery calcifications seen. 4. Small pericardial effusion. 5. Large left thyroid mass,  measuring perhaps 4.4 cm in size. This may be increased in size from 2017. Recommend further evaluation with thyroid ultrasound. If patient is clinically hyperthyroid, consider nuclear medicine thyroid uptake and scan. 6. Scattered aortic atherosclerosis.  02-14-17: TEE: - Left ventricle: Global longitudinal strai is -20.2%. The cavity size was normal. Wall thickness was increased in a pattern of mild LVH. Systolic function was normal. The estimated ejection fraction was in the range of 60% to 65%. Doppler parameters are consistent with abnormal left ventricular relaxation (grade 1  diastolic dysfunction). - Mitral valve: There was mild to moderate regurgitation. - Right atrium: The atrium was mildly dilated. - Tricuspid valve: There was mild-moderate regurgitation. - Pulmonary arteries: PA peak pressure: 40 mm Hg (S). - Pericardium, extracardiac: A trivial pericardial effusion was  identified.   02-15-17 ct head and cervical spine: 1. No acute intracranial abnormality. No skull fracture. Stable atrophy, chronic and remote ischemia. 2. Chronic and degenerative change in the cervical spine without evidence of acute fracture  02-15-17: left shoulder x-ray: Apparent mildly displaced fracture of the left acromion on a single view. Would correlate for associated symptoms.  02-15-17: left elbow: No evidence of fracture or dislocation.  02-15-17: pelvic x-ray: No evidence of fracture or dislocation.  REVIEWED TODAY:   02-28-17: left shoulder x-ray: unremarkable prosthesis. Evidence of old trauma     LABS REVIEWED: previous labs   02-17-16: wbc 7.0; hgb 11.9; hct 36.1; mcv 88.5; plt 157; glucose 93; bun 14; creat 1.19; k+ 3.5; na++ 138; liver normal albumin 3.5; urine culture: multiple species 02-18-16; hgb a1c 5.6; chol 214; ldl 142; trig 90; hdl 54; vit B 12: 353; ammonia 22; tsh 1.211; RPR: nr; HIV: nr 02-20-16; wbc 9.2; hgb 13.2; hct 39.9; mcv 87.9; plt 172; glucose 100; bun 8; creat 0.94; k+ 4.0; na++ 140;  mag 1.6  04-14-16; wbc 8.2; hgb 12.7; hct 41.2; mcv 88.2; plt 244; glucose 106; bun 13.1; create 0.92; k+ 3.8; na++ 146; liver normal albumin 3.8 mag 2.1    10-17-16; wbc 5.2; hgb 14.0; hct 44.3; mcv 89.1; plt 217; glucose 126; bun 21.6; creat 1.03; k+ 4.0; na++ 145; liver normal albumin 4.1; chol 193; ldl 116; trig 75; hdl 62; hgb a1c 6.3  12-06-16: wbc 7.1; hgb 13.8; hct 43.2 mcv 89.9; plt 219; glucose 123; bun 27.1; creat 1.23; k+ 4.5 ;na++ 144 ca 9.8  02-12-17: wbc 7.3; hgb 11.6; hct 36.7; mcv 88.7; plt 157; glucose 108; bun 21; creat 1.45; k+ 3.6; na+ 143; ca 9.0; liver normal albumin 3.3; d-dimer 0.50; mag 2.0 02-13-17: glucose  101; bun 17; creat 1.22; k+ 3.7; na++ 143; ca 9.0; tsh 1.807 02-14-17: wbc 5.9; hgb 12.4; hct 38.5; mcv 87.7; plt 165; glucose 99; bun 15; creat 1.01; k+ 3.7; na++ 143; ca 8.8   NO NEW LABS      Review of Systems  Constitutional: Negative for malaise/fatigue.  Respiratory: Negative for cough and shortness of breath.   Cardiovascular: Negative for chest pain, palpitations and leg swelling.  Gastrointestinal: Negative for abdominal pain, constipation and heartburn.  Musculoskeletal: Positive for joint pain. Negative for back pain and myalgias.       Left shoulder pain   Skin: Negative.   Neurological: Negative for dizziness.  Psychiatric/Behavioral: The patient is not nervous/anxious.     Physical Exam  Constitutional: No distress.  Thin   Eyes: Conjunctivae are normal.  Neck: Neck supple. No JVD present. No thyromegaly present.  Cardiovascular: Normal rate, regular rhythm and intact distal pulses.   Respiratory: Effort normal and breath sounds normal. No respiratory distress. She has no wheezes.  GI: Soft. Bowel sounds are normal. She exhibits no distension. There is no tenderness.  Musculoskeletal: She exhibits no edema.  Able to move all extremities Able to move all extremities  Does have neck contracture  Has limited range of motion to bilateral shoulders is  resistant to movement to the left shoulder    Lymphadenopathy:    She has no cervical adenopathy.  Neurological: She is alert.  Skin: Skin is warm and dry. She is not diaphoretic.  Psychiatric: She has a normal mood and affect.    ASSESSMENT/ PLAN:  FOR TODAY   1. Left acromial process fracture: is slightly worse due to pain; I have spoken with Dr. Montez Morita regarding her shoulder issues; will get a non contrast ct of both shoulders and will continue to monitor    MD is aware of resident's narcotic use and is in agreement with current plan of care. We will attempt to wean resident as apropriate   Synthia Innocent NP [facility]  Contact [phone] Monday through Friday 8am- 5pm  After hours call [phone]

## 2017-03-05 ENCOUNTER — Other Ambulatory Visit: Payer: Self-pay | Admitting: Adult Health

## 2017-03-05 DIAGNOSIS — M25511 Pain in right shoulder: Secondary | ICD-10-CM

## 2017-03-05 DIAGNOSIS — S42122A Displaced fracture of acromial process, left shoulder, initial encounter for closed fracture: Secondary | ICD-10-CM | POA: Insufficient documentation

## 2017-03-05 DIAGNOSIS — M25512 Pain in left shoulder: Principal | ICD-10-CM

## 2017-03-05 DIAGNOSIS — M25611 Stiffness of right shoulder, not elsewhere classified: Secondary | ICD-10-CM

## 2017-03-05 DIAGNOSIS — M25612 Stiffness of left shoulder, not elsewhere classified: Secondary | ICD-10-CM

## 2017-03-07 ENCOUNTER — Ambulatory Visit
Admission: RE | Admit: 2017-03-07 | Discharge: 2017-03-07 | Disposition: A | Discharge status: Home / Self Care | Payer: Medicare Other | Source: Ambulatory Visit | Attending: Adult Health | Admitting: Adult Health

## 2017-03-07 DIAGNOSIS — Z471 Aftercare following joint replacement surgery: Secondary | ICD-10-CM | POA: Diagnosis not present

## 2017-03-07 DIAGNOSIS — Z96611 Presence of right artificial shoulder joint: Secondary | ICD-10-CM | POA: Diagnosis not present

## 2017-03-07 DIAGNOSIS — M25611 Stiffness of right shoulder, not elsewhere classified: Secondary | ICD-10-CM

## 2017-03-07 DIAGNOSIS — M25511 Pain in right shoulder: Secondary | ICD-10-CM

## 2017-03-07 DIAGNOSIS — M25512 Pain in left shoulder: Principal | ICD-10-CM

## 2017-03-07 DIAGNOSIS — M25612 Stiffness of left shoulder, not elsewhere classified: Secondary | ICD-10-CM

## 2017-03-07 DIAGNOSIS — Z96612 Presence of left artificial shoulder joint: Secondary | ICD-10-CM | POA: Diagnosis not present

## 2017-03-08 ENCOUNTER — Other Ambulatory Visit: Payer: Self-pay

## 2017-03-08 ENCOUNTER — Non-Acute Institutional Stay (SKILLED_NURSING_FACILITY): Payer: Medicare Other | Admitting: Adult Health

## 2017-03-08 ENCOUNTER — Encounter: Payer: Self-pay | Admitting: Adult Health

## 2017-03-08 DIAGNOSIS — S42122D Displaced fracture of acromial process, left shoulder, subsequent encounter for fracture with routine healing: Secondary | ICD-10-CM | POA: Diagnosis not present

## 2017-03-08 MED ORDER — TRAMADOL HCL 50 MG PO TABS: 50.0000 mg | ORAL_TABLET | Freq: Four times a day (QID) | ORAL | 0 refills | Status: DC

## 2017-03-08 NOTE — Progress Notes (Signed)
Location:   [facility] Room Number: 214 B Place of Service:  SNF (31)   CODE STATUS: DNR  No Known Allergies  Chief Complaint  Patient presents with  . Acute Visit    CT follow up Left Shoulder    HPI:  She has had 2 sets of shoulder x-rays done; demonstrating a left shoulder fracture. She then went to orthopedics who determined that she did not have a fracture. She continues to have pain present without relief of pain. Therapy has expressed concern about her shoulder pain. Today she tells me that she is still having pain. The ct results have returned with a Displaced fracture of the base of the acromion extending into the remainder of the acromion process with comminution. No significant callus formation. She will need to return back to orthopedics for further evaluation.    Past Medical History:  Diagnosis Date  . COPD (chronic obstructive pulmonary disease) (HCC)   . Dementia   . Depression   . Dysphagia   . Hypertension   . Scoliosis     Past Surgical History:  Procedure Laterality Date  . ABDOMINAL HYSTERECTOMY    . KNEE SURGERY    . SHOULDER SURGERY    . VITRECTOMY AND CATARACT      Social History   Social History  . Marital status: Single    Spouse name: N/A  . Number of children: N/A  . Years of education: N/A   Occupational History  . Not on file.   Social History Main Topics  . Smoking status: Never Smoker  . Smokeless tobacco: Never Used  . Alcohol use No  . Drug use: No  . Sexual activity: Not on file   Other Topics Concern  . Not on file   Social History Narrative  . No narrative on file   Family History  Problem Relation Age of Onset  . Stroke Neg Hx       VITAL SIGNS BP 138/78   Pulse 71   Temp (!) 97.4 F (36.3 C)   Resp 18   Ht 5\' 1"  (1.549 m)   Wt 126 lb 12.8 oz (57.5 kg)   SpO2 96%   BMI 23.96 kg/m   Patient's Medications  New Prescriptions   No medications on file  Previous Medications   ACETAMINOPHEN (TYLENOL) 325 MG TABLET    Take 2 tablets (650 mg total) by mouth every 6 (six) hours as needed for mild pain, moderate pain, fever or headache.   APIXABAN (ELIQUIS) 2.5 MG TABS TABLET    Take 2.5 mg by mouth 2 (two) times daily.   BUSPIRONE (BUSPAR) 15 MG TABLET    Give 7.5 mg by mouth one time a day and 15 mg by mouth at bedtime   DONEPEZIL (ARICEPT) 10 MG TABLET    Take 1 tablet (10 mg total) by mouth at bedtime.   LIDOCAINE 4 % PTCH    Place 1 patch onto the skin daily. Apply to lower back.  Remove & Discard patch within 12 hours or as directed by MD   MEMANTINE (NAMENDA XR) 28 MG CP24 24 HR CAPSULE    Take 28 mg by mouth daily.   METOPROLOL SUCCINATE (TOPROL-XL) 50 MG 24 HR TABLET    Take 1 tablet (50 mg total) by mouth daily. Take with or immediately following a meal.   RISPERIDONE (RISPERDAL) 0.5 MG TABLET    Take 0.5 mg by mouth at bedtime.   TRAMADOL (ULTRAM) 50 MG  TABLET    Take 50 mg by mouth every 6 (six) hours as needed.  Modified Medications   No medications on file  Discontinued Medications   No medications on file     SIGNIFICANT DIAGNOSTIC EXAMS  02-18-16: carotid ultrasound: Bilateral: intimal wall thickening CCA. Mild soft plaque origin ICA. 1-39% ICA plaquing. Vertebral artery flow is antegrade.  02-12-17: chest x-ray: Cardiomegaly and mild interstitial edema. No focal airspace consolidation.  02-12-17:  ct of head and cervical spine: 1. Chronic small vessel ischemic disease. No acute intracranial abnormality. 2. Small chronic caudate lacunar infarct on the right. 3. Cerebral atrophy. 4. Calcified frontal meningioma.  02-13-17: thyroid ultrasound: Multinodular goiter. Two dominant nodules occupying a large portion of the left thyroid lobe. Both of these nodules meet criteria for biopsy. Recommend clinical correlation with regards to the need for tissue sampling based on patient's age and comorbidities.   02-14-17: ct angio of chest: 1. No evidence of pulmonary  embolus. 2. Mild bibasilar atelectasis or scarring noted. Mild interstitial prominence seen. Mild scarring at the lung apices. 3. Cardiomegaly.  Diffuse coronary artery calcifications seen. 4. Small pericardial effusion. 5. Large left thyroid mass, measuring perhaps 4.4 cm in size. This may be increased in size from 2017. Recommend further evaluation with thyroid ultrasound. If patient is clinically hyperthyroid, consider nuclear medicine thyroid uptake and scan. 6. Scattered aortic atherosclerosis.  02-14-17: TEE: - Left ventricle: Global longitudinal strai is -20.2%. The cavity size was normal. Wall thickness was increased in a pattern of mild LVH. Systolic function was normal. The estimated ejection fraction was in the range of 60% to 65%. Doppler parameters are consistent with abnormal left ventricular relaxation (grade 1  diastolic dysfunction). - Mitral valve: There was mild to moderate regurgitation. - Right atrium: The atrium was mildly dilated. - Tricuspid valve: There was mild-moderate regurgitation. - Pulmonary arteries: PA peak pressure: 40 mm Hg (S). - Pericardium, extracardiac: A trivial pericardial effusion was  identified.   02-15-17 ct head and cervical spine: 1. No acute intracranial abnormality. No skull fracture. Stable atrophy, chronic and remote ischemia. 2. Chronic and degenerative change in the cervical spine without evidence of acute fracture  02-15-17: left shoulder x-ray: Apparent mildly displaced fracture of the left acromion on a single view. Would correlate for associated symptoms.  02-15-17: left elbow: No evidence of fracture or dislocation.  02-15-17: pelvic x-ray: No evidence of fracture or dislocation.  02-28-17: left shoulder x-ray: unremarkable prosthesis. Evidence of old trauma    REVIEWED TODAY:  03-07-17: right shoulder ct scan: 1. No acute injury of the right shoulder. 2. Right shoulder arthroplasty without hardware failure or complication  03-07-17: left  shoulder ct scan: 1. Displaced fracture of the base of the acromion extending into the remainder of the acromion process with comminution. No significant callus formation. 2. Left shoulder arthroplasty without hardware failure or complication.    LABS REVIEWED: previous labs   02-17-16: wbc 7.0; hgb 11.9; hct 36.1; mcv 88.5; plt 157; glucose 93; bun 14; creat 1.19; k+ 3.5; na++ 138; liver normal albumin 3.5; urine culture: multiple species 02-18-16; hgb a1c 5.6; chol 214; ldl 142; trig 90; hdl 54; vit B 12: 353; ammonia 22; tsh 1.211; RPR: nr; HIV: nr 02-20-16; wbc 9.2; hgb 13.2; hct 39.9; mcv 87.9; plt 172; glucose 100; bun 8; creat 0.94; k+ 4.0; na++ 140; mag 1.6  04-14-16; wbc 8.2; hgb 12.7; hct 41.2; mcv 88.2; plt 244; glucose 106; bun 13.1; create 0.92; k+ 3.8; na++ 146;  liver normal albumin 3.8 mag 2.1    10-17-16; wbc 5.2; hgb 14.0; hct 44.3; mcv 89.1; plt 217; glucose 126; bun 21.6; creat 1.03; k+ 4.0; na++ 145; liver normal albumin 4.1; chol 193; ldl 116; trig 75; hdl 62; hgb a1c 6.3  12-06-16: wbc 7.1; hgb 13.8; hct 43.2 mcv 89.9; plt 219; glucose 123; bun 27.1; creat 1.23; k+ 4.5 ;na++ 144 ca 9.8  02-12-17: wbc 7.3; hgb 11.6; hct 36.7; mcv 88.7; plt 157; glucose 108; bun 21; creat 1.45; k+ 3.6; na+ 143; ca 9.0; liver normal albumin 3.3; d-dimer 0.50; mag 2.0 02-13-17: glucose 101; bun 17; creat 1.22; k+ 3.7; na++ 143; ca 9.0; tsh 1.807 02-14-17: wbc 5.9; hgb 12.4; hct 38.5; mcv 87.7; plt 165; glucose 99; bun 15; creat 1.01; k+ 3.7; na++ 143; ca 8.8   NO NEW LABS     Review of Systems  Constitutional: Negative for malaise/fatigue.  Respiratory: Negative for cough and shortness of breath.   Cardiovascular: Negative for chest pain, palpitations and leg swelling.  Gastrointestinal: Negative for abdominal pain, constipation and heartburn.  Musculoskeletal: Positive for joint pain. Negative for back pain and myalgias.       Has shoulder pain   Skin: Negative.   Neurological: Negative for dizziness.    Psychiatric/Behavioral: The patient is not nervous/anxious.     Physical Exam  Constitutional: No distress.  Thin   Eyes: Conjunctivae are normal.  Neck: Neck supple. No JVD present. No thyromegaly present.  Cardiovascular: Normal rate, regular rhythm and intact distal pulses.   Respiratory: Effort normal and breath sounds normal. No respiratory distress. She has no wheezes.  GI: Soft. Bowel sounds are normal. She exhibits no distension. There is no tenderness.  Musculoskeletal: She exhibits no edema.  Able to move all extremities  Does have neck contracture  Has limited range of motion to bilateral shoulders is resistant to movement to the left shoulder    Does have pain present   Lymphadenopathy:    She has no cervical adenopathy.  Neurological: She is alert.  Skin: Skin is warm and dry. She is not diaphoretic.  Psychiatric: She has a normal mood and affect.    ASSESSMENT/ PLAN:  FOR TODAY   1. Left acromial process fracture: no change in status: will place her back in the sling; will have her return to orthopaedics for further evaluation. Will change her ultram to 50 mg every 6 hours routinely and will continue to monitor her status.     MD is aware of resident's narcotic use and is in agreement with current plan of care. We will attempt to wean resident as apropriate   Synthia Innocenteborah Green NP [facility]  Contact [phone] Monday through Friday 8am- 5pm  After hours call [phone]

## 2017-03-08 NOTE — Telephone Encounter (Signed)
Rx fax[facility] to [facility]

## 2017-03-13 ENCOUNTER — Emergency Department (HOSPITAL_COMMUNITY)
Admission: EM | Admit: 2017-03-13 | Discharge: 2017-03-13 | Disposition: A | Discharge status: Nursing Home (Includes ICF) | Payer: Medicare Other | Attending: Emergency Medicine | Admitting: Emergency Medicine

## 2017-03-13 ENCOUNTER — Encounter (HOSPITAL_COMMUNITY): Payer: Self-pay | Admitting: *Deleted

## 2017-03-13 DIAGNOSIS — N183 Chronic kidney disease, stage 3 (moderate): Secondary | ICD-10-CM | POA: Diagnosis not present

## 2017-03-13 DIAGNOSIS — R262 Difficulty in walking, not elsewhere classified: Secondary | ICD-10-CM | POA: Diagnosis not present

## 2017-03-13 DIAGNOSIS — R1310 Dysphagia, unspecified: Secondary | ICD-10-CM | POA: Diagnosis not present

## 2017-03-13 DIAGNOSIS — I130 Hypertensive heart and chronic kidney disease with heart failure and stage 1 through stage 4 chronic kidney disease, or unspecified chronic kidney disease: Secondary | ICD-10-CM | POA: Insufficient documentation

## 2017-03-13 DIAGNOSIS — I6789 Other cerebrovascular disease: Secondary | ICD-10-CM | POA: Diagnosis not present

## 2017-03-13 DIAGNOSIS — J449 Chronic obstructive pulmonary disease, unspecified: Secondary | ICD-10-CM | POA: Insufficient documentation

## 2017-03-13 DIAGNOSIS — R5381 Other malaise: Secondary | ICD-10-CM | POA: Diagnosis not present

## 2017-03-13 DIAGNOSIS — I5032 Chronic diastolic (congestive) heart failure: Secondary | ICD-10-CM | POA: Insufficient documentation

## 2017-03-13 DIAGNOSIS — R251 Tremor, unspecified: Secondary | ICD-10-CM | POA: Diagnosis not present

## 2017-03-13 DIAGNOSIS — M6281 Muscle weakness (generalized): Secondary | ICD-10-CM | POA: Diagnosis not present

## 2017-03-13 DIAGNOSIS — G249 Dystonia, unspecified: Secondary | ICD-10-CM | POA: Diagnosis not present

## 2017-03-13 DIAGNOSIS — Z79899 Other long term (current) drug therapy: Secondary | ICD-10-CM | POA: Insufficient documentation

## 2017-03-13 DIAGNOSIS — R4182 Altered mental status, unspecified: Secondary | ICD-10-CM | POA: Diagnosis not present

## 2017-03-13 DIAGNOSIS — Z7901 Long term (current) use of anticoagulants: Secondary | ICD-10-CM | POA: Diagnosis not present

## 2017-03-13 DIAGNOSIS — R1311 Dysphagia, oral phase: Secondary | ICD-10-CM | POA: Diagnosis not present

## 2017-03-13 DIAGNOSIS — R1312 Dysphagia, oropharyngeal phase: Secondary | ICD-10-CM | POA: Diagnosis not present

## 2017-03-13 DIAGNOSIS — M62838 Other muscle spasm: Secondary | ICD-10-CM | POA: Diagnosis not present

## 2017-03-13 DIAGNOSIS — R279 Unspecified lack of coordination: Secondary | ICD-10-CM | POA: Diagnosis not present

## 2017-03-13 DIAGNOSIS — R55 Syncope and collapse: Secondary | ICD-10-CM | POA: Diagnosis not present

## 2017-03-13 LAB — BASIC METABOLIC PANEL
Anion gap: 8 (ref 5–15)
BUN: 34 mg/dL — ABNORMAL HIGH (ref 6–20)
CO2: 26 mmol/L (ref 22–32)
Calcium: 9 mg/dL (ref 8.9–10.3)
Chloride: 109 mmol/L (ref 101–111)
Creatinine, Ser: 1.68 mg/dL — ABNORMAL HIGH (ref 0.44–1.00)
GFR calc Af Amer: 30 mL/min — ABNORMAL LOW (ref >60)
GFR calc non Af Amer: 26 mL/min — ABNORMAL LOW (ref >60)
Glucose, Bld: 93 mg/dL (ref 65–99)
Potassium: 4 mmol/L (ref 3.5–5.1)
Sodium: 143 mmol/L (ref 135–145)

## 2017-03-13 LAB — I-STAT CHEM 8, ED
BUN: 32 mg/dL — ABNORMAL HIGH (ref 6–20)
Calcium, Ion: 1.17 mmol/L (ref 1.15–1.40)
Chloride: 105 mmol/L (ref 101–111)
Creatinine, Ser: 1.7 mg/dL — ABNORMAL HIGH (ref 0.44–1.00)
Glucose, Bld: 86 mg/dL (ref 65–99)
HCT: 39 % (ref 36.0–46.0)
Hemoglobin: 13.3 g/dL (ref 12.0–15.0)
Potassium: 4.1 mmol/L (ref 3.5–5.1)
Sodium: 144 mmol/L (ref 135–145)
TCO2: 25 mmol/L (ref 0–100)

## 2017-03-13 LAB — CBC WITH DIFFERENTIAL/PLATELET
Basophils Absolute: 0 10*3/uL (ref 0.0–0.1)
Basophils Relative: 1 %
Eosinophils Absolute: 0.2 10*3/uL (ref 0.0–0.7)
Eosinophils Relative: 3 %
HCT: 36.9 % (ref 36.0–46.0)
Hemoglobin: 12.3 g/dL (ref 12.0–15.0)
Lymphocytes Relative: 26 %
Lymphs Abs: 1.7 10*3/uL (ref 0.7–4.0)
MCH: 28.9 pg (ref 26.0–34.0)
MCHC: 33.3 g/dL (ref 30.0–36.0)
MCV: 86.8 fL (ref 78.0–100.0)
Monocytes Absolute: 1 10*3/uL (ref 0.1–1.0)
Monocytes Relative: 15 %
Neutro Abs: 3.7 10*3/uL (ref 1.7–7.7)
Neutrophils Relative %: 55 %
Platelets: 182 10*3/uL (ref 150–400)
RBC: 4.25 MIL/uL (ref 3.87–5.11)
RDW: 15 % (ref 11.5–15.5)
WBC: 6.5 10*3/uL (ref 4.0–10.5)

## 2017-03-13 LAB — URINALYSIS, ROUTINE W REFLEX MICROSCOPIC
Bilirubin Urine: NEGATIVE
Glucose, UA: NEGATIVE mg/dL
Hgb urine dipstick: NEGATIVE
Ketones, ur: NEGATIVE mg/dL
Leukocytes, UA: NEGATIVE
Nitrite: NEGATIVE
Protein, ur: NEGATIVE mg/dL
Specific Gravity, Urine: 1.02 (ref 1.005–1.030)
pH: 5 (ref 5.0–8.0)

## 2017-03-13 MED ORDER — DIPHENHYDRAMINE HCL 50 MG/ML IJ SOLN
25.0000 mg | Freq: Once | INTRAMUSCULAR | Status: AC
  Administered 2017-03-13: 25 mg via INTRAVENOUS
  Filled 2017-03-13: qty 1

## 2017-03-13 MED ORDER — SODIUM CHLORIDE 0.9 % IV BOLUS (SEPSIS)
1000.0000 mL | Freq: Once | INTRAVENOUS | Status: AC
  Administered 2017-03-13: 1000 mL via INTRAVENOUS

## 2017-03-13 MED ORDER — HYDROCODONE-ACETAMINOPHEN 5-325 MG PO TABS: 1.0000 | ORAL_TABLET | Freq: Four times a day (QID) | ORAL | 0 refills | Status: DC | PRN

## 2017-03-13 NOTE — ED Triage Notes (Signed)
Per EMS, [facility] sent by staff for muscle tremors for the past 24 hours, primarily in face. Pt is alert, confused at baseline. Pt knows name. Pt has recent left clavicle fracture from 3 weeks ago. Pt denies pain at this time. Staff report pt had temp of 96.3.  BP 126/68 HR 68 RR18

## 2017-03-13 NOTE — Progress Notes (Signed)
[facility] from [facility]. Is able to return at discharge if patient desires.   Stacy GardnerErin Davenport, [facility]WA Emergency Room Clinical Social Worker [phone]

## 2017-03-13 NOTE — ED Notes (Signed)
Bed: WA23 Expected date:  Expected time:  Means of arrival:  Comments: Hall C 

## 2017-03-13 NOTE — Discharge Instructions (Signed)
Stop taking tramadol.  Begin taking hydrocodone as prescribed today as needed for pain.  Benadryl 25 mg every 6 hours for shaking.  Follow-up with primary Dr. if not improving in the next several days, and return to the ER if symptoms significantly worsen or change.

## 2017-03-13 NOTE — ED Notes (Signed)
Pt pulled out IV, bleeding controlled & gauze applied.  Catheter intact.  Hx dementia, A&Ox1.  Family at bedside.  Md aware.  Pt encouraged to take oral fluids.  Tolerating well, on second cup of water.

## 2017-03-13 NOTE — ED Provider Notes (Signed)
[facility]-EMERGENCY DEPT Provider Note   CSN: [id number] Arrival date & time: 03/13/17  1202     History   Chief Complaint Chief Complaint  Patient presents with  . Spasms    HPI Roberta Griffin is a 83 y.o. female.  Patient is an 83 year old female with history of Alzheimer's dementia sent from the nursing home for evaluation of tremulousness. She has apparently been experiencing "spasms" of the face and arms. Patient adds very little history secondary to dementia. She denies to me that anything is hurting. According to the daughter who is present at bedside, the patient did experience a fall several weeks ago causing a fractured clavicle.   The history is provided by the patient.    Past Medical History:  Diagnosis Date  . COPD (chronic obstructive pulmonary disease) (HCC)   . Dementia   . Depression   . Dysphagia   . Hypertension   . Scoliosis     Patient Active Problem List   Diagnosis Date Noted  . Closed displaced fracture of left acromial process 03/05/2017  . Syncope 02/13/2017  . Chronic arterial ischemic stroke 12/05/2016  . Chronic pulmonary embolism (HCC) 12/05/2016  . Chronic diastolic CHF (congestive heart failure) (HCC) 03/04/2016  . Insomnia 03/04/2016  . Dysarthria 03/04/2016  . Pericardial effusion 03/04/2016  . Thyroid nodule 03/04/2016  . Pancreatic mass   . HLD (hyperlipidemia)   . Dysphagia 02/18/2016  . COPD (chronic obstructive pulmonary disease) (HCC) 02/17/2016  . Dementia 02/17/2016  . Essential hypertension 02/17/2016  . Normocytic anemia 02/17/2016  . CKD (chronic kidney disease), stage III 02/17/2016    Past Surgical History:  Procedure Laterality Date  . ABDOMINAL HYSTERECTOMY    . KNEE SURGERY    . SHOULDER SURGERY    . VITRECTOMY AND CATARACT      OB History    No data available       Home Medications    Prior to Admission medications   Medication Sig Start Date End Date Taking? Authorizing Provider  acetaminophen  (TYLENOL) 325 MG tablet Take 2 tablets (650 mg total) by mouth every 6 (six) hours as needed for mild pain, moderate pain, fever or headache. 02/23/16   Hongalgi, Maximino GreenlandAnand D, MD  apixaban (ELIQUIS) 2.5 MG TABS tablet Take 2.5 mg by mouth 2 (two) times daily.    [provider]  busPIRone (BUSPAR) 15 MG tablet Give 7.5 mg by mouth one time a day and 15 mg by mouth at bedtime    [provider]  donepezil (ARICEPT) 10 MG tablet Take 1 tablet (10 mg total) by mouth at bedtime. 02/09/16   Mesner, Barbara CowerJason, MD  Lidocaine 4 % PTCH Place 1 patch onto the skin daily. Apply to lower back.  Remove & Discard patch within 12 hours or as directed by MD    [provider]  memantine (NAMENDA XR) 28 MG CP24 24 hr capsule Take 28 mg by mouth daily.    [provider]  metoprolol succinate (TOPROL-XL) 50 MG 24 hr tablet Take 1 tablet (50 mg total) by mouth daily. Take with or immediately following a meal. 02/09/16   Mesner, Barbara CowerJason, MD  risperiDONE (RISPERDAL) 0.5 MG tablet Take 0.5 mg by mouth at bedtime.    [provider]  traMADol (ULTRAM) 50 MG tablet Take 1 tablet (50 mg total) by mouth every 6 (six) hours. 03/08/17   Sharee HolsterGreen, Deborah S, NP    Family History Family History  Problem Relation Age of Onset  .  Stroke Neg Hx     Social History Social History  Substance Use Topics  . Smoking status: Never Smoker  . Smokeless tobacco: Never Used  . Alcohol use No     Allergies   Patient has no known allergies.   Review of Systems Review of Systems  All other systems reviewed and are negative.    Physical Exam Updated Vital Signs BP (!) 145/82 (BP Location: Right Arm)   Pulse (!) 56   Temp 98.1 F (36.7 C) (Oral)   Resp 18   SpO2 95%   Physical Exam  Constitutional: She appears well-developed and well-nourished. No distress.  HENT:  Head: Normocephalic and atraumatic.  Neck: Normal range of motion. Neck supple.  Cardiovascular: Normal rate and regular  rhythm.  Exam reveals no gallop and no friction rub.   No murmur heard. Pulmonary/Chest: Effort normal and breath sounds normal. No respiratory distress. She has no wheezes.  Abdominal: Soft. Bowel sounds are normal. She exhibits no distension. There is no tenderness.  Musculoskeletal: Normal range of motion.  Neurological: She is alert.  Patient is awake and alert. She moves all extremities. There is an intermittent contraction of the facial muscles and upper extremities that lasts several seconds, then resolves.  Skin: Skin is warm and dry. She is not diaphoretic.  Nursing note and vitals reviewed.    ED Treatments / Results  Labs (all labs ordered are listed, but only abnormal results are displayed) Labs Reviewed  I-STAT CHEM 8, ED - Abnormal; Notable for the following:       Result Value   BUN 32 (*)    Creatinine, Ser 1.70 (*)    All other components within normal limits  BASIC METABOLIC PANEL  CBC WITH DIFFERENTIAL/PLATELET  URINALYSIS, ROUTINE W REFLEX MICROSCOPIC    EKG  EKG Interpretation None       Radiology No results found.  Procedures Procedures (including critical care time)  Medications Ordered in ED Medications  sodium chloride 0.9 % bolus 1,000 mL (not administered)     Initial Impression / Assessment and Plan / ED Course  I have reviewed the triage vital signs and the nursing notes.  Pertinent labs & imaging results that were available during my care of the patient were reviewed by me and considered in my medical decision making (see chart for details).  Patient presents for evaluation of intermittent shaking. She was recently started on tramadol after injuring her shoulder. She appears to be having some sort of dystonia, possibly related to tramadol. The remainder of her workup is unremarkable. I see no indication for admission. I will plan to stop this medication and have Benadryl administered as needed.   Final Clinical Impressions(s) / ED  Diagnoses   Final diagnoses:  None    New Prescriptions New Prescriptions   No medications on file     Geoffery Lyonselo, [place], MD 03/13/17 [phone]

## 2017-03-14 ENCOUNTER — Encounter: Payer: Self-pay | Admitting: Adult Health

## 2017-03-14 ENCOUNTER — Non-Acute Institutional Stay (SKILLED_NURSING_FACILITY): Payer: Medicare Other | Admitting: Adult Health

## 2017-03-14 DIAGNOSIS — M6281 Muscle weakness (generalized): Secondary | ICD-10-CM | POA: Diagnosis not present

## 2017-03-14 DIAGNOSIS — R1310 Dysphagia, unspecified: Secondary | ICD-10-CM | POA: Diagnosis not present

## 2017-03-14 DIAGNOSIS — R55 Syncope and collapse: Secondary | ICD-10-CM | POA: Diagnosis not present

## 2017-03-14 DIAGNOSIS — R279 Unspecified lack of coordination: Secondary | ICD-10-CM | POA: Diagnosis not present

## 2017-03-14 DIAGNOSIS — T50991A Poisoning by other drugs, medicaments and biological substances, accidental (unintentional), initial encounter: Secondary | ICD-10-CM

## 2017-03-14 DIAGNOSIS — R262 Difficulty in walking, not elsewhere classified: Secondary | ICD-10-CM | POA: Diagnosis not present

## 2017-03-14 DIAGNOSIS — R1311 Dysphagia, oral phase: Secondary | ICD-10-CM | POA: Diagnosis not present

## 2017-03-14 NOTE — Progress Notes (Signed)
Location:   [facility] Room Number: 214 B Place of Service:  SNF (31)   CODE STATUS: DNR  Allergies  Allergen Reactions  . Tramadol Other (See Comments)    twitching    Chief Complaint  Patient presents with  . Acute Visit    ED follow up    HPI:  She did go the ED yesterday for abnormal twitching in her face and arms which began yestarday; per the ED report the twitching was intermittent and lasted for several seconds. The thought was this is related to her serotonin level due to beginning ultram. This medication was stopped. Today she is sleeping and is not very responsive. There are no reports of further twitching present. The staff report that she is no longer twitching.  She is unable to participate in the hpi or ros.    Past Medical History:  Diagnosis Date  . COPD (chronic obstructive pulmonary disease) (HCC)   . Dementia   . Depression   . Dysphagia   . Hypertension   . Scoliosis     Past Surgical History:  Procedure Laterality Date  . ABDOMINAL HYSTERECTOMY    . KNEE SURGERY    . SHOULDER SURGERY    . VITRECTOMY AND CATARACT      Social History   Social History  . Marital status: Single    Spouse name: N/A  . Number of children: N/A  . Years of education: N/A   Occupational History  . Not on file.   Social History Main Topics  . Smoking status: Never Smoker  . Smokeless tobacco: Never Used  . Alcohol use No  . Drug use: No  . Sexual activity: Not on file   Other Topics Concern  . Not on file   Social History Narrative  . No narrative on file   Family History  Problem Relation Age of Onset  . Stroke Neg Hx       VITAL SIGNS BP 111/62   Pulse (!) 58   Temp 98.8 F (37.1 C)   Resp 16   Ht 5\' 1"  (1.549 m)   Wt 126 lb 12.8 oz (57.5 kg)   SpO2 96%   BMI 23.96 kg/m   Patient's Medications  New Prescriptions   No medications on file  Previous Medications   ACETAMINOPHEN (TYLENOL) 325 MG TABLET    Take 2 tablets  (650 mg total) by mouth every 6 (six) hours as needed for mild pain, moderate pain, fever or headache.   APIXABAN (ELIQUIS) 2.5 MG TABS TABLET    Take 2.5 mg by mouth 2 (two) times daily.   BUSPIRONE (BUSPAR) 15 MG TABLET    Give 7.5 mg by mouth one time a day and 15 mg by mouth at bedtime   DONEPEZIL (ARICEPT) 10 MG TABLET    Take 1 tablet (10 mg total) by mouth at bedtime.   HYDROCODONE-ACETAMINOPHEN (NORCO) 5-325 MG TABLET    Take 1 tablet by mouth every 6 (six) hours as needed.   LIDOCAINE 4 % PTCH    Place 1 patch onto the skin daily. Apply to lower back.  Remove & Discard patch within 12 hours or as directed by MD   MEMANTINE (NAMENDA XR) 28 MG CP24 24 HR CAPSULE    Take 28 mg by mouth daily.   METOPROLOL SUCCINATE (TOPROL-XL) 50 MG 24 HR TABLET    Take 1 tablet (50 mg total) by mouth daily. Take with or immediately following a meal.  RISPERIDONE (RISPERDAL) 0.5 MG TABLET    Take 0.5 mg by mouth at bedtime.  Modified Medications   No medications on file  Discontinued Medications   TRAMADOL (ULTRAM) 50 MG TABLET    Take 1 tablet (50 mg total) by mouth every 6 (six) hours.     SIGNIFICANT DIAGNOSTIC EXAMS  PREVIOUS:   02-18-16: carotid ultrasound: Bilateral: intimal wall thickening CCA. Mild soft plaque origin ICA. 1-39% ICA plaquing. Vertebral artery flow is antegrade.  02-12-17: chest x-ray: Cardiomegaly and mild interstitial edema. No focal airspace consolidation.  02-12-17:  ct of head and cervical spine: 1. Chronic small vessel ischemic disease. No acute intracranial abnormality. 2. Small chronic caudate lacunar infarct on the right. 3. Cerebral atrophy. 4. Calcified frontal meningioma.  02-13-17: thyroid ultrasound: Multinodular goiter. Two dominant nodules occupying a large portion of the left thyroid lobe. Both of these nodules meet criteria for biopsy. Recommend clinical correlation with regards to the need for tissue sampling based on patient's age and comorbidities.   02-14-17:  ct angio of chest: 1. No evidence of pulmonary embolus. 2. Mild bibasilar atelectasis or scarring noted. Mild interstitial prominence seen. Mild scarring at the lung apices. 3. Cardiomegaly.  Diffuse coronary artery calcifications seen. 4. Small pericardial effusion. 5. Large left thyroid mass, measuring perhaps 4.4 cm in size. This may be increased in size from 2017. Recommend further evaluation with thyroid ultrasound. If patient is clinically hyperthyroid, consider nuclear medicine thyroid uptake and scan. 6. Scattered aortic atherosclerosis.  02-14-17: TEE: - Left ventricle: Global longitudinal strai is -20.2%. The cavity size was normal. Wall thickness was increased in a pattern of mild LVH. Systolic function was normal. The estimated ejection fraction was in the range of 60% to 65%. Doppler parameters are consistent with abnormal left ventricular relaxation (grade 1  diastolic dysfunction). - Mitral valve: There was mild to moderate regurgitation. - Right atrium: The atrium was mildly dilated. - Tricuspid valve: There was mild-moderate regurgitation. - Pulmonary arteries: PA peak pressure: 40 mm Hg (S). - Pericardium, extracardiac: A trivial pericardial effusion was  identified.   02-15-17 ct head and cervical spine: 1. No acute intracranial abnormality. No skull fracture. Stable atrophy, chronic and remote ischemia. 2. Chronic and degenerative change in the cervical spine without evidence of acute fracture  02-15-17: left shoulder x-ray: Apparent mildly displaced fracture of the left acromion on a single view. Would correlate for associated symptoms.  02-15-17: left elbow: No evidence of fracture or dislocation.  02-15-17: pelvic x-ray: No evidence of fracture or dislocation.  02-28-17: left shoulder x-ray: unremarkable prosthesis. Evidence of old trauma    03-07-17: right shoulder ct scan: 1. No acute injury of the right shoulder. 2. Right shoulder arthroplasty without hardware failure or  complication  03-07-17: left shoulder ct scan: 1. Displaced fracture of the base of the acromion extending into the remainder of the acromion process with comminution. No significant callus formation. 2. Left shoulder arthroplasty without hardware failure or complication.  NO NEW EXAMS     LABS REVIEWED: PREVIOUS LABS    04-14-16; wbc 8.2; hgb 12.7; hct 41.2; mcv 88.2; plt 244; glucose 106; bun 13.1; create 0.92; k+ 3.8; na++ 146; liver normal albumin 3.8 mag 2.1    10-17-16; wbc 5.2; hgb 14.0; hct 44.3; mcv 89.1; plt 217; glucose 126; bun 21.6; creat 1.03; k+ 4.0; na++ 145; liver normal albumin 4.1; chol 193; ldl 116; trig 75; hdl 62; hgb a1c 6.3  12-06-16: wbc 7.1; hgb 13.8; hct 43.2 mcv 89.9;  plt 219; glucose 123; bun 27.1; creat 1.23; k+ 4.5 ;na++ 144 ca 9.8  02-12-17: wbc 7.3; hgb 11.6; hct 36.7; mcv 88.7; plt 157; glucose 108; bun 21; creat 1.45; k+ 3.6; na+ 143; ca 9.0; liver normal albumin 3.3; d-dimer 0.50; mag 2.0 02-13-17: glucose 101; bun 17; creat 1.22; k+ 3.7; na++ 143; ca 9.0; tsh 1.807 02-14-17: wbc 5.9; hgb 12.4; hct 38.5; mcv 87.7; plt 165; glucose 99; bun 15; creat 1.01; k+ 3.7; na++ 143; ca 8.8   TODAY: 03-13-17: wbc 6.5; hgb 12.3; hct 36.9; mcv 86.8; plt 182; glucose 93; bun 34; creat 1.68; k+ 4.0; na++ 143; ca 9.0     Review of Systems  Unable to perform ROS: Other (is sleepy and unable to anwer questoins. )    Physical Exam  Constitutional: No distress.  Thin   Eyes: Conjunctivae are normal.  Neck: Neck supple. No JVD present. No thyromegaly present.  Cardiovascular: Normal rate, regular rhythm and intact distal pulses.   Respiratory: Effort normal and breath sounds normal. No respiratory distress. She has no wheezes.  GI: Soft. Bowel sounds are normal. She exhibits no distension. There is no tenderness.  Musculoskeletal: She exhibits no edema.  Able to move all extremities Has neck contracture    Lymphadenopathy:    She has no cervical adenopathy.  Neurological:    Sleepy  No twitching present   Skin: Skin is warm and dry. She is not diaphoretic.    ASSESSMENT/ PLAN:  FOR TODAY   1. serotonin toxicity:  Is improving. She is now off the ultram. Will avoid medications that will increase serotonin levels. Will continue to monitor her status.    MD is aware of resident's narcotic use and is in agreement with current plan of care. We will attempt to wean resident as apropriate     Synthia Innocent NP [facility]  Contact [phone] Monday through Friday 8am- 5pm  After hours call [phone]

## 2017-03-15 DIAGNOSIS — R262 Difficulty in walking, not elsewhere classified: Secondary | ICD-10-CM | POA: Diagnosis not present

## 2017-03-15 DIAGNOSIS — M6281 Muscle weakness (generalized): Secondary | ICD-10-CM | POA: Diagnosis not present

## 2017-03-15 DIAGNOSIS — R279 Unspecified lack of coordination: Secondary | ICD-10-CM | POA: Diagnosis not present

## 2017-03-15 DIAGNOSIS — R1311 Dysphagia, oral phase: Secondary | ICD-10-CM | POA: Diagnosis not present

## 2017-03-15 DIAGNOSIS — R55 Syncope and collapse: Secondary | ICD-10-CM | POA: Diagnosis not present

## 2017-03-15 DIAGNOSIS — R1310 Dysphagia, unspecified: Secondary | ICD-10-CM | POA: Diagnosis not present

## 2017-03-16 DIAGNOSIS — R279 Unspecified lack of coordination: Secondary | ICD-10-CM | POA: Diagnosis not present

## 2017-03-16 DIAGNOSIS — R1310 Dysphagia, unspecified: Secondary | ICD-10-CM | POA: Diagnosis not present

## 2017-03-16 DIAGNOSIS — M6281 Muscle weakness (generalized): Secondary | ICD-10-CM | POA: Diagnosis not present

## 2017-03-16 DIAGNOSIS — R262 Difficulty in walking, not elsewhere classified: Secondary | ICD-10-CM | POA: Diagnosis not present

## 2017-03-16 DIAGNOSIS — R55 Syncope and collapse: Secondary | ICD-10-CM | POA: Diagnosis not present

## 2017-03-16 DIAGNOSIS — R1311 Dysphagia, oral phase: Secondary | ICD-10-CM | POA: Diagnosis not present

## 2017-03-17 DIAGNOSIS — M6281 Muscle weakness (generalized): Secondary | ICD-10-CM | POA: Diagnosis not present

## 2017-03-17 DIAGNOSIS — R55 Syncope and collapse: Secondary | ICD-10-CM | POA: Diagnosis not present

## 2017-03-17 DIAGNOSIS — R262 Difficulty in walking, not elsewhere classified: Secondary | ICD-10-CM | POA: Diagnosis not present

## 2017-03-17 DIAGNOSIS — R1310 Dysphagia, unspecified: Secondary | ICD-10-CM | POA: Diagnosis not present

## 2017-03-17 DIAGNOSIS — R279 Unspecified lack of coordination: Secondary | ICD-10-CM | POA: Diagnosis not present

## 2017-03-17 DIAGNOSIS — R1311 Dysphagia, oral phase: Secondary | ICD-10-CM | POA: Diagnosis not present

## 2017-03-18 DIAGNOSIS — R1310 Dysphagia, unspecified: Secondary | ICD-10-CM | POA: Diagnosis not present

## 2017-03-18 DIAGNOSIS — M6281 Muscle weakness (generalized): Secondary | ICD-10-CM | POA: Diagnosis not present

## 2017-03-18 DIAGNOSIS — R262 Difficulty in walking, not elsewhere classified: Secondary | ICD-10-CM | POA: Diagnosis not present

## 2017-03-18 DIAGNOSIS — R55 Syncope and collapse: Secondary | ICD-10-CM | POA: Diagnosis not present

## 2017-03-18 DIAGNOSIS — R1311 Dysphagia, oral phase: Secondary | ICD-10-CM | POA: Diagnosis not present

## 2017-03-18 DIAGNOSIS — R279 Unspecified lack of coordination: Secondary | ICD-10-CM | POA: Diagnosis not present

## 2017-03-19 DIAGNOSIS — R55 Syncope and collapse: Secondary | ICD-10-CM | POA: Diagnosis not present

## 2017-03-19 DIAGNOSIS — R1310 Dysphagia, unspecified: Secondary | ICD-10-CM | POA: Diagnosis not present

## 2017-03-19 DIAGNOSIS — R262 Difficulty in walking, not elsewhere classified: Secondary | ICD-10-CM | POA: Diagnosis not present

## 2017-03-19 DIAGNOSIS — M6281 Muscle weakness (generalized): Secondary | ICD-10-CM | POA: Diagnosis not present

## 2017-03-19 DIAGNOSIS — R279 Unspecified lack of coordination: Secondary | ICD-10-CM | POA: Diagnosis not present

## 2017-03-19 DIAGNOSIS — R1311 Dysphagia, oral phase: Secondary | ICD-10-CM | POA: Diagnosis not present

## 2017-03-20 DIAGNOSIS — R1311 Dysphagia, oral phase: Secondary | ICD-10-CM | POA: Diagnosis not present

## 2017-03-20 DIAGNOSIS — R279 Unspecified lack of coordination: Secondary | ICD-10-CM | POA: Diagnosis not present

## 2017-03-20 DIAGNOSIS — R262 Difficulty in walking, not elsewhere classified: Secondary | ICD-10-CM | POA: Diagnosis not present

## 2017-03-20 DIAGNOSIS — R55 Syncope and collapse: Secondary | ICD-10-CM | POA: Diagnosis not present

## 2017-03-20 DIAGNOSIS — R1310 Dysphagia, unspecified: Secondary | ICD-10-CM | POA: Diagnosis not present

## 2017-03-20 DIAGNOSIS — M6281 Muscle weakness (generalized): Secondary | ICD-10-CM | POA: Diagnosis not present

## 2017-03-21 ENCOUNTER — Ambulatory Visit (INDEPENDENT_AMBULATORY_CARE_PROVIDER_SITE_OTHER): Payer: Medicare Other | Admitting: Orthopedic Surgery

## 2017-03-21 DIAGNOSIS — R279 Unspecified lack of coordination: Secondary | ICD-10-CM | POA: Diagnosis not present

## 2017-03-21 DIAGNOSIS — R55 Syncope and collapse: Secondary | ICD-10-CM | POA: Diagnosis not present

## 2017-03-21 DIAGNOSIS — M6281 Muscle weakness (generalized): Secondary | ICD-10-CM | POA: Diagnosis not present

## 2017-03-21 DIAGNOSIS — R1311 Dysphagia, oral phase: Secondary | ICD-10-CM | POA: Diagnosis not present

## 2017-03-21 DIAGNOSIS — R262 Difficulty in walking, not elsewhere classified: Secondary | ICD-10-CM | POA: Diagnosis not present

## 2017-03-21 DIAGNOSIS — R1310 Dysphagia, unspecified: Secondary | ICD-10-CM | POA: Diagnosis not present

## 2017-03-22 ENCOUNTER — Telehealth (INDEPENDENT_AMBULATORY_CARE_PROVIDER_SITE_OTHER): Payer: Self-pay | Admitting: Orthopedic Surgery

## 2017-03-22 ENCOUNTER — Encounter (INDEPENDENT_AMBULATORY_CARE_PROVIDER_SITE_OTHER): Payer: Self-pay | Admitting: Orthopedic Surgery

## 2017-03-22 ENCOUNTER — Other Ambulatory Visit (INDEPENDENT_AMBULATORY_CARE_PROVIDER_SITE_OTHER): Payer: Self-pay | Admitting: Family

## 2017-03-22 ENCOUNTER — Ambulatory Visit (INDEPENDENT_AMBULATORY_CARE_PROVIDER_SITE_OTHER): Payer: Medicare Other | Admitting: Orthopedic Surgery

## 2017-03-22 VITALS — Ht 61.0 in | Wt 126.0 lb

## 2017-03-22 DIAGNOSIS — S42122D Displaced fracture of acromial process, left shoulder, subsequent encounter for fracture with routine healing: Secondary | ICD-10-CM | POA: Diagnosis not present

## 2017-03-22 DIAGNOSIS — R1311 Dysphagia, oral phase: Secondary | ICD-10-CM | POA: Diagnosis not present

## 2017-03-22 DIAGNOSIS — I639 Cerebral infarction, unspecified: Secondary | ICD-10-CM

## 2017-03-22 DIAGNOSIS — R1310 Dysphagia, unspecified: Secondary | ICD-10-CM | POA: Diagnosis not present

## 2017-03-22 DIAGNOSIS — R262 Difficulty in walking, not elsewhere classified: Secondary | ICD-10-CM | POA: Diagnosis not present

## 2017-03-22 DIAGNOSIS — R279 Unspecified lack of coordination: Secondary | ICD-10-CM | POA: Diagnosis not present

## 2017-03-22 DIAGNOSIS — R55 Syncope and collapse: Secondary | ICD-10-CM | POA: Diagnosis not present

## 2017-03-22 DIAGNOSIS — M6281 Muscle weakness (generalized): Secondary | ICD-10-CM | POA: Diagnosis not present

## 2017-03-22 NOTE — Telephone Encounter (Signed)
Cal[facility] called asking for weight bearing restrictions for the patient. CB # [phone]

## 2017-03-22 NOTE — Telephone Encounter (Signed)
?   This is the pt that you saw today for the clavicle fracture. SNF calling for WTB restrictions I am assuming for use of walker. Please advise

## 2017-03-22 NOTE — Telephone Encounter (Signed)
Called and sw therapy dept to advise.

## 2017-03-23 DIAGNOSIS — M6281 Muscle weakness (generalized): Secondary | ICD-10-CM | POA: Diagnosis not present

## 2017-03-23 DIAGNOSIS — R279 Unspecified lack of coordination: Secondary | ICD-10-CM | POA: Diagnosis not present

## 2017-03-23 DIAGNOSIS — R55 Syncope and collapse: Secondary | ICD-10-CM | POA: Diagnosis not present

## 2017-03-23 DIAGNOSIS — R1310 Dysphagia, unspecified: Secondary | ICD-10-CM | POA: Diagnosis not present

## 2017-03-23 DIAGNOSIS — R262 Difficulty in walking, not elsewhere classified: Secondary | ICD-10-CM | POA: Diagnosis not present

## 2017-03-23 DIAGNOSIS — R1311 Dysphagia, oral phase: Secondary | ICD-10-CM | POA: Diagnosis not present

## 2017-03-25 ENCOUNTER — Telehealth (INDEPENDENT_AMBULATORY_CARE_PROVIDER_SITE_OTHER): Payer: Self-pay | Admitting: Orthopedic Surgery

## 2017-03-25 ENCOUNTER — Encounter: Payer: Self-pay | Admitting: Adult Health

## 2017-03-25 ENCOUNTER — Non-Acute Institutional Stay (SKILLED_NURSING_FACILITY): Payer: Medicare Other | Admitting: Adult Health

## 2017-03-25 ENCOUNTER — Other Ambulatory Visit (INDEPENDENT_AMBULATORY_CARE_PROVIDER_SITE_OTHER): Payer: Self-pay

## 2017-03-25 DIAGNOSIS — M6281 Muscle weakness (generalized): Secondary | ICD-10-CM | POA: Diagnosis not present

## 2017-03-25 DIAGNOSIS — G309 Alzheimer's disease, unspecified: Secondary | ICD-10-CM | POA: Diagnosis not present

## 2017-03-25 DIAGNOSIS — F0280 Dementia in other diseases classified elsewhere without behavioral disturbance: Secondary | ICD-10-CM

## 2017-03-25 DIAGNOSIS — R279 Unspecified lack of coordination: Secondary | ICD-10-CM | POA: Diagnosis not present

## 2017-03-25 DIAGNOSIS — J438 Other emphysema: Secondary | ICD-10-CM

## 2017-03-25 DIAGNOSIS — N183 Chronic kidney disease, stage 3 unspecified: Secondary | ICD-10-CM

## 2017-03-25 DIAGNOSIS — R1311 Dysphagia, oral phase: Secondary | ICD-10-CM | POA: Diagnosis not present

## 2017-03-25 DIAGNOSIS — R1310 Dysphagia, unspecified: Secondary | ICD-10-CM | POA: Diagnosis not present

## 2017-03-25 DIAGNOSIS — R262 Difficulty in walking, not elsewhere classified: Secondary | ICD-10-CM | POA: Diagnosis not present

## 2017-03-25 DIAGNOSIS — R55 Syncope and collapse: Secondary | ICD-10-CM | POA: Diagnosis not present

## 2017-03-25 DIAGNOSIS — F411 Generalized anxiety disorder: Secondary | ICD-10-CM

## 2017-03-25 NOTE — Telephone Encounter (Signed)
Holly from [facility] called asking for the specific protocol for shoulder. CB # [phone]  Fax # [phone]

## 2017-03-25 NOTE — Progress Notes (Signed)
Location:   [facility] Room Number: 214 B Place of Service:  SNF (31)   CODE STATUS: DNR  Allergies  Allergen Reactions  . Tramadol Other (See Comments)    twitching    Chief Complaint  Patient presents with  . Medical Management of Chronic Issues    1 month follow up    HPI:  She is a 83 year old long ter resident of this facility is being seen for the management of her chronic illnesses copd; dementia; ckd stage III; anxiety. She is unable to participate in the hpi or ros. She is ambulating with therapy she is not using the sling all the time. There are reports of uncontrolled pain; no further falls; no episodes of anxiety. There are no nursing concerns at this time.   Past Medical History:  Diagnosis Date  . COPD (chronic obstructive pulmonary disease) (HCC)   . Dementia   . Depression   . Dysphagia   . Hypertension   . Scoliosis     Past Surgical History:  Procedure Laterality Date  . ABDOMINAL HYSTERECTOMY    . KNEE SURGERY    . SHOULDER SURGERY    . VITRECTOMY AND CATARACT      Social History   Social History  . Marital status: Single    Spouse name: N/A  . Number of children: N/A  . Years of education: N/A   Occupational History  . Not on file.   Social History Main Topics  . Smoking status: Never Smoker  . Smokeless tobacco: Never Used  . Alcohol use No  . Drug use: No  . Sexual activity: Not on file   Other Topics Concern  . Not on file   Social History Narrative  . No narrative on file   Family History  Problem Relation Age of Onset  . Stroke Neg Hx       VITAL SIGNS BP (!) 146/80   Pulse 71   Temp (!) 96.9 F (36.1 C)   Resp 20   Ht 5\' 1"  (1.549 m)   Wt 133 lb 6.4 oz (60.5 kg)   SpO2 98%   BMI 25.21 kg/m   Patient's Medications  New Prescriptions   No medications on file  Previous Medications   ACETAMINOPHEN (TYLENOL) 325 MG TABLET    Take 2 tablets (650 mg total) by mouth every 6 (six) hours as needed  for mild pain, moderate pain, fever or headache.   APIXABAN (ELIQUIS) 2.5 MG TABS TABLET    Take 2.5 mg by mouth 2 (two) times daily.   BUSPIRONE (BUSPAR) 15 MG TABLET    Give 7.5 mg by mouth one time a day and 15 mg by mouth at bedtime   DONEPEZIL (ARICEPT) 10 MG TABLET    Take 1 tablet (10 mg total) by mouth at bedtime.   HYDROCODONE-ACETAMINOPHEN (NORCO) 5-325 MG TABLET    Take 1 tablet by mouth every 6 (six) hours as needed.   LIDOCAINE 4 % PTCH    Place 1 patch onto the skin daily. Apply to lower back.  Remove & Discard patch within 12 hours or as directed by MD   MEMANTINE (NAMENDA XR) 28 MG CP24 24 HR CAPSULE    Take 28 mg by mouth daily.   METOPROLOL SUCCINATE (TOPROL-XL) 50 MG 24 HR TABLET    Take 1 tablet (50 mg total) by mouth daily. Take with or immediately following a meal.   RISPERIDONE (RISPERDAL) 0.5 MG TABLET  Take 0.5 mg by mouth at bedtime.  Modified Medications   No medications on file  Discontinued Medications   No medications on file     SIGNIFICANT DIAGNOSTIC EXAMS   02-18-16: carotid ultrasound: Bilateral: intimal wall thickening CCA. Mild soft plaque origin ICA. 1-39% ICA plaquing. Vertebral artery flow is antegrade.  02-12-17: chest x-ray: Cardiomegaly and mild interstitial edema. No focal airspace consolidation.  02-12-17:  ct of head and cervical spine: 1. Chronic small vessel ischemic disease. No acute intracranial abnormality. 2. Small chronic caudate lacunar infarct on the right. 3. Cerebral atrophy. 4. Calcified frontal meningioma.  02-13-17: thyroid ultrasound: Multinodular goiter. Two dominant nodules occupying a large portion of the left thyroid lobe. Both of these nodules meet criteria for biopsy. Recommend clinical correlation with regards to the need for tissue sampling based on patient's age and comorbidities.   02-14-17: ct angio of chest: 1. No evidence of pulmonary embolus. 2. Mild bibasilar atelectasis or scarring noted. Mild interstitial prominence  seen. Mild scarring at the lung apices. 3. Cardiomegaly.  Diffuse coronary artery calcifications seen. 4. Small pericardial effusion. 5. Large left thyroid mass, measuring perhaps 4.4 cm in size. This may be increased in size from 2017. Recommend further evaluation with thyroid ultrasound. If patient is clinically hyperthyroid, consider nuclear medicine thyroid uptake and scan. 6. Scattered aortic atherosclerosis.  02-14-17: TEE: - Left ventricle: Global longitudinal strai is -20.2%. The cavity size was normal. Wall thickness was increased in a pattern of mild LVH. Systolic function was normal. The estimated ejection fraction was in the range of 60% to 65%. Doppler parameters are consistent with abnormal left ventricular relaxation (grade 1  diastolic dysfunction). - Mitral valve: There was mild to moderate regurgitation. - Right atrium: The atrium was mildly dilated. - Tricuspid valve: There was mild-moderate regurgitation. - Pulmonary arteries: PA peak pressure: 40 mm Hg (S). - Pericardium, extracardiac: A trivial pericardial effusion was  identified.   02-15-17 ct head and cervical spine: 1. No acute intracranial abnormality. No skull fracture. Stable atrophy, chronic and remote ischemia. 2. Chronic and degenerative change in the cervical spine without evidence of acute fracture  02-15-17: left shoulder x-ray: Apparent mildly displaced fracture of the left acromion on a single view. Would correlate for associated symptoms.  02-15-17: left elbow: No evidence of fracture or dislocation.  02-15-17: pelvic x-ray: No evidence of fracture or dislocation.  02-28-17: left shoulder x-ray: unremarkable prosthesis. Evidence of old trauma    03-07-17: right shoulder ct scan: 1. No acute injury of the right shoulder. 2. Right shoulder arthroplasty without hardware failure or complication  03-07-17: left shoulder ct scan: 1. Displaced fracture of the base of the acromion extending into the remainder of the  acromion process with comminution. No significant callus formation. 2. Left shoulder arthroplasty without hardware failure or complication.  NO NEW EXAMS     LABS REVIEWED: PREVIOUS LABS    04-14-16; wbc 8.2; hgb 12.7; hct 41.2; mcv 88.2; plt 244; glucose 106; bun 13.1; create 0.92; k+ 3.8; na++ 146; liver normal albumin 3.8 mag 2.1    10-17-16; wbc 5.2; hgb 14.0; hct 44.3; mcv 89.1; plt 217; glucose 126; bun 21.6; creat 1.03; k+ 4.0; na++ 145; liver normal albumin 4.1; chol 193; ldl 116; trig 75; hdl 62; hgb a1c 6.3  12-06-16: wbc 7.1; hgb 13.8; hct 43.2 mcv 89.9; plt 219; glucose 123; bun 27.1; creat 1.23; k+ 4.5 ;na++ 144 ca 9.8  02-12-17: wbc 7.3; hgb 11.6; hct 36.7; mcv 88.7; plt 157;  glucose 108; bun 21; creat 1.45; k+ 3.6; na+ 143; ca 9.0; liver normal albumin 3.3; d-dimer 0.50; mag 2.0 02-13-17: glucose 101; bun 17; creat 1.22; k+ 3.7; na++ 143; ca 9.0; tsh 1.807 02-14-17: wbc 5.9; hgb 12.4; hct 38.5; mcv 87.7; plt 165; glucose 99; bun 15; creat 1.01; k+ 3.7; na++ 143; ca 8.8  03-13-17: wbc 6.5; hgb 12.3; hct 36.9; mcv 86.8; plt 182; glucose 93; bun 34; creat 1.68; k+ 4.0; na++ 143; ca 9.0    NO NEW LABS    Review of Systems  Unable to perform ROS: Dementia (is unable to fully answer questions )   Physical Exam  Constitutional: No distress.  thin  Eyes: Conjunctivae are normal.  Neck: Neck supple. No JVD present. No thyromegaly present.  Cardiovascular: Normal rate, regular rhythm and intact distal pulses.   Respiratory: Effort normal and breath sounds normal. No respiratory distress. She has no wheezes.  GI: Soft. Bowel sounds are normal. She exhibits no distension. There is no tenderness.  Musculoskeletal: She exhibits no edema.  Able to move all extremities Left arm in sling Has neck contracture    Lymphadenopathy:    She has no cervical adenopathy.  Neurological: She is alert.  Skin: Skin is warm and dry. She is not diaphoretic.  Psychiatric: She has a normal mood and affect.    ASSESSMENT/ PLAN:   FOR TODAY    1. COPD: is stable is currently not on medications will continue to monitor her status.   2. Vascular dementia: she is without change in her status; her weight is 126 pounds: will continue aricept 10 mg nightly and namenda xr 28 mg daily will monitor   3. CKD stage III: is stable bun 15; creat 1.01 will monitor   4. Anxiety: she is emotionally stable will continue buspar 15 mg twice daily     CHRONIC PROBLEMS   5. Psychosis: no change in her status she is currently without obvious hallucinations; will continue risperdal 0.5 mg nightly   6. Left acromial process fracture: is is improving; she is not using her sling at this time.has vicodin 5/325 mg every 6 hours as needed  Will monitor   7. Chronic pulmonary embolism: her respiratory status is stable will continue eliquis 2.5 mg twice daily  8. Chronic arterial  ischemic stroke: is neurology stable will continue eliquis 2.5 mg twice daily   9. Hypertension: b/p 146/80 is stable will continue toprol xl 50 mg daily  10. Thyroid nodule: her tsh is normal at 1.807: stable  due to her advanced age and advanced dementia: I have spoken with Dr. Montez Moritaarter; will not proceed with any further workup as she will not be able to participate in the workup. Will continue to monitor her status.    MD is aware of resident's narcotic use and is in agreement with current plan of care. We will attempt to wean resident as apropriate   Synthia Innocenteborah Alekai Pocock NP [facility]  Contact [phone] Monday through Friday 8am- 5pm  After hours call [phone]

## 2017-03-25 NOTE — Telephone Encounter (Signed)
Faxed order to physical therapy to instruct as per our conversation WTBAT on the left upper extremity and activity as tolerated by pt.

## 2017-03-26 DIAGNOSIS — R279 Unspecified lack of coordination: Secondary | ICD-10-CM | POA: Diagnosis not present

## 2017-03-26 DIAGNOSIS — M6281 Muscle weakness (generalized): Secondary | ICD-10-CM | POA: Diagnosis not present

## 2017-03-26 DIAGNOSIS — R55 Syncope and collapse: Secondary | ICD-10-CM | POA: Diagnosis not present

## 2017-03-26 DIAGNOSIS — R1310 Dysphagia, unspecified: Secondary | ICD-10-CM | POA: Diagnosis not present

## 2017-03-26 DIAGNOSIS — R262 Difficulty in walking, not elsewhere classified: Secondary | ICD-10-CM | POA: Diagnosis not present

## 2017-03-26 DIAGNOSIS — R1311 Dysphagia, oral phase: Secondary | ICD-10-CM | POA: Diagnosis not present

## 2017-03-27 DIAGNOSIS — R279 Unspecified lack of coordination: Secondary | ICD-10-CM | POA: Diagnosis not present

## 2017-03-27 DIAGNOSIS — M6281 Muscle weakness (generalized): Secondary | ICD-10-CM | POA: Diagnosis not present

## 2017-03-27 DIAGNOSIS — R1311 Dysphagia, oral phase: Secondary | ICD-10-CM | POA: Diagnosis not present

## 2017-03-27 DIAGNOSIS — R1310 Dysphagia, unspecified: Secondary | ICD-10-CM | POA: Diagnosis not present

## 2017-03-27 DIAGNOSIS — R55 Syncope and collapse: Secondary | ICD-10-CM | POA: Diagnosis not present

## 2017-03-27 DIAGNOSIS — R262 Difficulty in walking, not elsewhere classified: Secondary | ICD-10-CM | POA: Diagnosis not present

## 2017-03-27 NOTE — Progress Notes (Signed)
Office Visit Note   Patient: Roberta Griffin           Date of Birth: July 23, 1926           MRN: [id number] Visit Date: 03/22/2017              Requested by: Kirt Boys, DO [address] [place], [place] [address] PCP: Kirt Boys, DO  Chief Complaint  Patient presents with  . Left Shoulder - Pain    ER 02/15/17 s/p fall, CT scan 03/07/17 displaced clavicle fracture.       HPI: The patient is an 83 year old woman who is seen today for evaluation of a left acromion fracture. Fracture occurred on July 6. The patient is currently residing in skilled nursing, has advanced dementia. Did have bilateral reversed shoulder replacements as well. Today is in a sling on the left has no concerns of pain. Does have tramadol that the nursing home is using as needed for pain.  Assessment & Plan: Visit Diagnoses:  1. Closed displaced fracture of left acromial process with routine healing, subsequent encounter     Plan: May discontinue the sling. May weight-bear with the bilateral upper extremities as tolerated. Will follow-up in the office for any ongoing concerns at this point we do not need to see her back.  Follow-Up Instructions: Return in about 4 weeks (around 04/19/2017), or if symptoms worsen or fail to improve.   Left Shoulder Exam   Tenderness  The patient is experiencing no tenderness.     Range of Motion  Passive Abduction: 70  Forward Flexion: 90   Tests  Impingement: negative  Other  Erythema: absent Pulse: present       Patient is alert, oriented, no adenopathy, well-dressed, normal affect, normal respiratory effort.   Imaging: No results found. No images are attached to the encounter.  Labs: Lab Results  Component Value Date   HGBA1C 5.6 02/18/2016   REPTSTATUS 02/19/2016 FINAL 02/17/2016   CULT MULTIPLE SPECIES PRESENT, SUGGEST RECOLLECTION (A) 02/17/2016    Orders:  No orders of the defined types were placed in this encounter.  No orders of the  defined types were placed in this encounter.    Procedures: No procedures performed  Clinical Data: No additional findings.  ROS:  All other systems negative, except as noted in the HPI. Review of Systems  Constitutional: Negative for chills and fever.  Musculoskeletal: Positive for arthralgias. Negative for joint swelling.  Skin: Negative for wound.    Objective: Vital Signs: Ht 5\' 1"  (1.549 m)   Wt 126 lb (57.2 kg)   BMI 23.81 kg/m   Specialty Comments:  No specialty comments available.  PMFS History: Patient Active Problem List   Diagnosis Date Noted  . Closed displaced fracture of left acromial process 03/05/2017  . Syncope 02/13/2017  . Chronic arterial ischemic stroke 12/05/2016  . Chronic pulmonary embolism (HCC) 12/05/2016  . Chronic diastolic CHF (congestive heart failure) (HCC) 03/04/2016  . Insomnia 03/04/2016  . Dysarthria 03/04/2016  . Pericardial effusion 03/04/2016  . Thyroid nodule 03/04/2016  . Pancreatic mass   . HLD (hyperlipidemia)   . Dysphagia 02/18/2016  . COPD (chronic obstructive pulmonary disease) (HCC) 02/17/2016  . Dementia 02/17/2016  . Essential hypertension 02/17/2016  . Normocytic anemia 02/17/2016  . CKD (chronic kidney disease), stage III 02/17/2016   Past Medical History:  Diagnosis Date  . COPD (chronic obstructive pulmonary disease) (HCC)   . Dementia   . Depression   . Dysphagia   .  Hypertension   . Scoliosis     Family History  Problem Relation Age of Onset  . Stroke Neg Hx     Past Surgical History:  Procedure Laterality Date  . ABDOMINAL HYSTERECTOMY    . KNEE SURGERY    . SHOULDER SURGERY    . VITRECTOMY AND CATARACT     Social History   Occupational History  . Not on file.   Social History Main Topics  . Smoking status: Never Smoker  . Smokeless tobacco: Never Used  . Alcohol use No  . Drug use: No  . Sexual activity: Not on file

## 2017-03-28 DIAGNOSIS — R1311 Dysphagia, oral phase: Secondary | ICD-10-CM | POA: Diagnosis not present

## 2017-03-28 DIAGNOSIS — M6281 Muscle weakness (generalized): Secondary | ICD-10-CM | POA: Diagnosis not present

## 2017-03-28 DIAGNOSIS — R55 Syncope and collapse: Secondary | ICD-10-CM | POA: Diagnosis not present

## 2017-03-28 DIAGNOSIS — R262 Difficulty in walking, not elsewhere classified: Secondary | ICD-10-CM | POA: Diagnosis not present

## 2017-03-28 DIAGNOSIS — T50991A Poisoning by other drugs, medicaments and biological substances, accidental (unintentional), initial encounter: Secondary | ICD-10-CM | POA: Insufficient documentation

## 2017-03-28 DIAGNOSIS — R1310 Dysphagia, unspecified: Secondary | ICD-10-CM | POA: Diagnosis not present

## 2017-03-28 DIAGNOSIS — R279 Unspecified lack of coordination: Secondary | ICD-10-CM | POA: Diagnosis not present

## 2017-03-29 DIAGNOSIS — R55 Syncope and collapse: Secondary | ICD-10-CM | POA: Diagnosis not present

## 2017-03-29 DIAGNOSIS — R1310 Dysphagia, unspecified: Secondary | ICD-10-CM | POA: Diagnosis not present

## 2017-03-29 DIAGNOSIS — M6281 Muscle weakness (generalized): Secondary | ICD-10-CM | POA: Diagnosis not present

## 2017-03-29 DIAGNOSIS — R279 Unspecified lack of coordination: Secondary | ICD-10-CM | POA: Diagnosis not present

## 2017-03-29 DIAGNOSIS — R262 Difficulty in walking, not elsewhere classified: Secondary | ICD-10-CM | POA: Diagnosis not present

## 2017-03-29 DIAGNOSIS — R1311 Dysphagia, oral phase: Secondary | ICD-10-CM | POA: Diagnosis not present

## 2017-03-30 DIAGNOSIS — M6281 Muscle weakness (generalized): Secondary | ICD-10-CM | POA: Diagnosis not present

## 2017-03-30 DIAGNOSIS — R55 Syncope and collapse: Secondary | ICD-10-CM | POA: Diagnosis not present

## 2017-03-30 DIAGNOSIS — R262 Difficulty in walking, not elsewhere classified: Secondary | ICD-10-CM | POA: Diagnosis not present

## 2017-03-30 DIAGNOSIS — R279 Unspecified lack of coordination: Secondary | ICD-10-CM | POA: Diagnosis not present

## 2017-03-30 DIAGNOSIS — R1311 Dysphagia, oral phase: Secondary | ICD-10-CM | POA: Diagnosis not present

## 2017-03-30 DIAGNOSIS — R1310 Dysphagia, unspecified: Secondary | ICD-10-CM | POA: Diagnosis not present

## 2017-03-31 DIAGNOSIS — R1310 Dysphagia, unspecified: Secondary | ICD-10-CM | POA: Diagnosis not present

## 2017-03-31 DIAGNOSIS — R55 Syncope and collapse: Secondary | ICD-10-CM | POA: Diagnosis not present

## 2017-03-31 DIAGNOSIS — R1311 Dysphagia, oral phase: Secondary | ICD-10-CM | POA: Diagnosis not present

## 2017-03-31 DIAGNOSIS — M6281 Muscle weakness (generalized): Secondary | ICD-10-CM | POA: Diagnosis not present

## 2017-03-31 DIAGNOSIS — R279 Unspecified lack of coordination: Secondary | ICD-10-CM | POA: Diagnosis not present

## 2017-03-31 DIAGNOSIS — R262 Difficulty in walking, not elsewhere classified: Secondary | ICD-10-CM | POA: Diagnosis not present

## 2017-04-01 DIAGNOSIS — R262 Difficulty in walking, not elsewhere classified: Secondary | ICD-10-CM | POA: Diagnosis not present

## 2017-04-01 DIAGNOSIS — M6281 Muscle weakness (generalized): Secondary | ICD-10-CM | POA: Diagnosis not present

## 2017-04-01 DIAGNOSIS — R1311 Dysphagia, oral phase: Secondary | ICD-10-CM | POA: Diagnosis not present

## 2017-04-01 DIAGNOSIS — R279 Unspecified lack of coordination: Secondary | ICD-10-CM | POA: Diagnosis not present

## 2017-04-01 DIAGNOSIS — R1310 Dysphagia, unspecified: Secondary | ICD-10-CM | POA: Diagnosis not present

## 2017-04-01 DIAGNOSIS — R55 Syncope and collapse: Secondary | ICD-10-CM | POA: Diagnosis not present

## 2017-04-02 DIAGNOSIS — R1311 Dysphagia, oral phase: Secondary | ICD-10-CM | POA: Diagnosis not present

## 2017-04-02 DIAGNOSIS — R55 Syncope and collapse: Secondary | ICD-10-CM | POA: Diagnosis not present

## 2017-04-02 DIAGNOSIS — R279 Unspecified lack of coordination: Secondary | ICD-10-CM | POA: Diagnosis not present

## 2017-04-02 DIAGNOSIS — R1310 Dysphagia, unspecified: Secondary | ICD-10-CM | POA: Diagnosis not present

## 2017-04-02 DIAGNOSIS — R262 Difficulty in walking, not elsewhere classified: Secondary | ICD-10-CM | POA: Diagnosis not present

## 2017-04-02 DIAGNOSIS — M6281 Muscle weakness (generalized): Secondary | ICD-10-CM | POA: Diagnosis not present

## 2017-04-03 DIAGNOSIS — Z961 Presence of intraocular lens: Secondary | ICD-10-CM | POA: Diagnosis not present

## 2017-04-03 DIAGNOSIS — H47611 Cortical blindness, right side of brain: Secondary | ICD-10-CM | POA: Diagnosis not present

## 2017-04-03 DIAGNOSIS — Z7901 Long term (current) use of anticoagulants: Secondary | ICD-10-CM | POA: Diagnosis not present

## 2017-04-03 DIAGNOSIS — R55 Syncope and collapse: Secondary | ICD-10-CM | POA: Diagnosis not present

## 2017-04-03 DIAGNOSIS — R1311 Dysphagia, oral phase: Secondary | ICD-10-CM | POA: Diagnosis not present

## 2017-04-03 DIAGNOSIS — R279 Unspecified lack of coordination: Secondary | ICD-10-CM | POA: Diagnosis not present

## 2017-04-03 DIAGNOSIS — R1310 Dysphagia, unspecified: Secondary | ICD-10-CM | POA: Diagnosis not present

## 2017-04-03 DIAGNOSIS — M6281 Muscle weakness (generalized): Secondary | ICD-10-CM | POA: Diagnosis not present

## 2017-04-03 DIAGNOSIS — R262 Difficulty in walking, not elsewhere classified: Secondary | ICD-10-CM | POA: Diagnosis not present

## 2017-04-04 DIAGNOSIS — R1311 Dysphagia, oral phase: Secondary | ICD-10-CM | POA: Diagnosis not present

## 2017-04-04 DIAGNOSIS — R262 Difficulty in walking, not elsewhere classified: Secondary | ICD-10-CM | POA: Diagnosis not present

## 2017-04-04 DIAGNOSIS — R1310 Dysphagia, unspecified: Secondary | ICD-10-CM | POA: Diagnosis not present

## 2017-04-04 DIAGNOSIS — R279 Unspecified lack of coordination: Secondary | ICD-10-CM | POA: Diagnosis not present

## 2017-04-04 DIAGNOSIS — R55 Syncope and collapse: Secondary | ICD-10-CM | POA: Diagnosis not present

## 2017-04-04 DIAGNOSIS — M6281 Muscle weakness (generalized): Secondary | ICD-10-CM | POA: Diagnosis not present

## 2017-04-05 DIAGNOSIS — R1311 Dysphagia, oral phase: Secondary | ICD-10-CM | POA: Diagnosis not present

## 2017-04-05 DIAGNOSIS — M6281 Muscle weakness (generalized): Secondary | ICD-10-CM | POA: Diagnosis not present

## 2017-04-05 DIAGNOSIS — R279 Unspecified lack of coordination: Secondary | ICD-10-CM | POA: Diagnosis not present

## 2017-04-05 DIAGNOSIS — R55 Syncope and collapse: Secondary | ICD-10-CM | POA: Diagnosis not present

## 2017-04-05 DIAGNOSIS — R262 Difficulty in walking, not elsewhere classified: Secondary | ICD-10-CM | POA: Diagnosis not present

## 2017-04-05 DIAGNOSIS — F411 Generalized anxiety disorder: Secondary | ICD-10-CM | POA: Insufficient documentation

## 2017-04-05 DIAGNOSIS — R1310 Dysphagia, unspecified: Secondary | ICD-10-CM | POA: Diagnosis not present

## 2017-04-06 DIAGNOSIS — R279 Unspecified lack of coordination: Secondary | ICD-10-CM | POA: Diagnosis not present

## 2017-04-06 DIAGNOSIS — R55 Syncope and collapse: Secondary | ICD-10-CM | POA: Diagnosis not present

## 2017-04-06 DIAGNOSIS — R1310 Dysphagia, unspecified: Secondary | ICD-10-CM | POA: Diagnosis not present

## 2017-04-06 DIAGNOSIS — R1311 Dysphagia, oral phase: Secondary | ICD-10-CM | POA: Diagnosis not present

## 2017-04-06 DIAGNOSIS — R262 Difficulty in walking, not elsewhere classified: Secondary | ICD-10-CM | POA: Diagnosis not present

## 2017-04-06 DIAGNOSIS — M6281 Muscle weakness (generalized): Secondary | ICD-10-CM | POA: Diagnosis not present

## 2017-04-08 DIAGNOSIS — R55 Syncope and collapse: Secondary | ICD-10-CM | POA: Diagnosis not present

## 2017-04-08 DIAGNOSIS — R1310 Dysphagia, unspecified: Secondary | ICD-10-CM | POA: Diagnosis not present

## 2017-04-08 DIAGNOSIS — R262 Difficulty in walking, not elsewhere classified: Secondary | ICD-10-CM | POA: Diagnosis not present

## 2017-04-08 DIAGNOSIS — R279 Unspecified lack of coordination: Secondary | ICD-10-CM | POA: Diagnosis not present

## 2017-04-08 DIAGNOSIS — M6281 Muscle weakness (generalized): Secondary | ICD-10-CM | POA: Diagnosis not present

## 2017-04-08 DIAGNOSIS — R1311 Dysphagia, oral phase: Secondary | ICD-10-CM | POA: Diagnosis not present

## 2017-04-09 DIAGNOSIS — R1311 Dysphagia, oral phase: Secondary | ICD-10-CM | POA: Diagnosis not present

## 2017-04-09 DIAGNOSIS — R279 Unspecified lack of coordination: Secondary | ICD-10-CM | POA: Diagnosis not present

## 2017-04-09 DIAGNOSIS — M6281 Muscle weakness (generalized): Secondary | ICD-10-CM | POA: Diagnosis not present

## 2017-04-09 DIAGNOSIS — R262 Difficulty in walking, not elsewhere classified: Secondary | ICD-10-CM | POA: Diagnosis not present

## 2017-04-09 DIAGNOSIS — R55 Syncope and collapse: Secondary | ICD-10-CM | POA: Diagnosis not present

## 2017-04-09 DIAGNOSIS — R1310 Dysphagia, unspecified: Secondary | ICD-10-CM | POA: Diagnosis not present

## 2017-04-10 DIAGNOSIS — R1311 Dysphagia, oral phase: Secondary | ICD-10-CM | POA: Diagnosis not present

## 2017-04-10 DIAGNOSIS — M6281 Muscle weakness (generalized): Secondary | ICD-10-CM | POA: Diagnosis not present

## 2017-04-10 DIAGNOSIS — R1310 Dysphagia, unspecified: Secondary | ICD-10-CM | POA: Diagnosis not present

## 2017-04-10 DIAGNOSIS — R262 Difficulty in walking, not elsewhere classified: Secondary | ICD-10-CM | POA: Diagnosis not present

## 2017-04-10 DIAGNOSIS — R279 Unspecified lack of coordination: Secondary | ICD-10-CM | POA: Diagnosis not present

## 2017-04-10 DIAGNOSIS — R55 Syncope and collapse: Secondary | ICD-10-CM | POA: Diagnosis not present

## 2017-04-11 DIAGNOSIS — R1311 Dysphagia, oral phase: Secondary | ICD-10-CM | POA: Diagnosis not present

## 2017-04-11 DIAGNOSIS — R279 Unspecified lack of coordination: Secondary | ICD-10-CM | POA: Diagnosis not present

## 2017-04-11 DIAGNOSIS — M6281 Muscle weakness (generalized): Secondary | ICD-10-CM | POA: Diagnosis not present

## 2017-04-11 DIAGNOSIS — R262 Difficulty in walking, not elsewhere classified: Secondary | ICD-10-CM | POA: Diagnosis not present

## 2017-04-11 DIAGNOSIS — R1310 Dysphagia, unspecified: Secondary | ICD-10-CM | POA: Diagnosis not present

## 2017-04-11 DIAGNOSIS — R55 Syncope and collapse: Secondary | ICD-10-CM | POA: Diagnosis not present

## 2017-04-12 DIAGNOSIS — R55 Syncope and collapse: Secondary | ICD-10-CM | POA: Diagnosis not present

## 2017-04-12 DIAGNOSIS — M6281 Muscle weakness (generalized): Secondary | ICD-10-CM | POA: Diagnosis not present

## 2017-04-12 DIAGNOSIS — R262 Difficulty in walking, not elsewhere classified: Secondary | ICD-10-CM | POA: Diagnosis not present

## 2017-04-12 DIAGNOSIS — R1310 Dysphagia, unspecified: Secondary | ICD-10-CM | POA: Diagnosis not present

## 2017-04-12 DIAGNOSIS — R1311 Dysphagia, oral phase: Secondary | ICD-10-CM | POA: Diagnosis not present

## 2017-04-12 DIAGNOSIS — R279 Unspecified lack of coordination: Secondary | ICD-10-CM | POA: Diagnosis not present

## 2017-04-13 DIAGNOSIS — R262 Difficulty in walking, not elsewhere classified: Secondary | ICD-10-CM | POA: Diagnosis not present

## 2017-04-13 DIAGNOSIS — M6281 Muscle weakness (generalized): Secondary | ICD-10-CM | POA: Diagnosis not present

## 2017-04-13 DIAGNOSIS — R1312 Dysphagia, oropharyngeal phase: Secondary | ICD-10-CM | POA: Diagnosis not present

## 2017-04-13 DIAGNOSIS — R1311 Dysphagia, oral phase: Secondary | ICD-10-CM | POA: Diagnosis not present

## 2017-04-13 DIAGNOSIS — R1310 Dysphagia, unspecified: Secondary | ICD-10-CM | POA: Diagnosis not present

## 2017-04-13 DIAGNOSIS — R55 Syncope and collapse: Secondary | ICD-10-CM | POA: Diagnosis not present

## 2017-04-13 DIAGNOSIS — R279 Unspecified lack of coordination: Secondary | ICD-10-CM | POA: Diagnosis not present

## 2017-04-15 DIAGNOSIS — R55 Syncope and collapse: Secondary | ICD-10-CM | POA: Diagnosis not present

## 2017-04-15 DIAGNOSIS — R1310 Dysphagia, unspecified: Secondary | ICD-10-CM | POA: Diagnosis not present

## 2017-04-15 DIAGNOSIS — R279 Unspecified lack of coordination: Secondary | ICD-10-CM | POA: Diagnosis not present

## 2017-04-15 DIAGNOSIS — M6281 Muscle weakness (generalized): Secondary | ICD-10-CM | POA: Diagnosis not present

## 2017-04-15 DIAGNOSIS — R1311 Dysphagia, oral phase: Secondary | ICD-10-CM | POA: Diagnosis not present

## 2017-04-15 DIAGNOSIS — R262 Difficulty in walking, not elsewhere classified: Secondary | ICD-10-CM | POA: Diagnosis not present

## 2017-04-16 DIAGNOSIS — R1310 Dysphagia, unspecified: Secondary | ICD-10-CM | POA: Diagnosis not present

## 2017-04-16 DIAGNOSIS — R55 Syncope and collapse: Secondary | ICD-10-CM | POA: Diagnosis not present

## 2017-04-16 DIAGNOSIS — R1311 Dysphagia, oral phase: Secondary | ICD-10-CM | POA: Diagnosis not present

## 2017-04-16 DIAGNOSIS — R262 Difficulty in walking, not elsewhere classified: Secondary | ICD-10-CM | POA: Diagnosis not present

## 2017-04-16 DIAGNOSIS — R279 Unspecified lack of coordination: Secondary | ICD-10-CM | POA: Diagnosis not present

## 2017-04-16 DIAGNOSIS — M6281 Muscle weakness (generalized): Secondary | ICD-10-CM | POA: Diagnosis not present

## 2017-04-17 DIAGNOSIS — R279 Unspecified lack of coordination: Secondary | ICD-10-CM | POA: Diagnosis not present

## 2017-04-17 DIAGNOSIS — R55 Syncope and collapse: Secondary | ICD-10-CM | POA: Diagnosis not present

## 2017-04-17 DIAGNOSIS — R262 Difficulty in walking, not elsewhere classified: Secondary | ICD-10-CM | POA: Diagnosis not present

## 2017-04-17 DIAGNOSIS — M6281 Muscle weakness (generalized): Secondary | ICD-10-CM | POA: Diagnosis not present

## 2017-04-17 DIAGNOSIS — R1310 Dysphagia, unspecified: Secondary | ICD-10-CM | POA: Diagnosis not present

## 2017-04-17 DIAGNOSIS — R1311 Dysphagia, oral phase: Secondary | ICD-10-CM | POA: Diagnosis not present

## 2017-04-18 DIAGNOSIS — R279 Unspecified lack of coordination: Secondary | ICD-10-CM | POA: Diagnosis not present

## 2017-04-18 DIAGNOSIS — F419 Anxiety disorder, unspecified: Secondary | ICD-10-CM | POA: Diagnosis not present

## 2017-04-18 DIAGNOSIS — R1311 Dysphagia, oral phase: Secondary | ICD-10-CM | POA: Diagnosis not present

## 2017-04-18 DIAGNOSIS — R262 Difficulty in walking, not elsewhere classified: Secondary | ICD-10-CM | POA: Diagnosis not present

## 2017-04-18 DIAGNOSIS — F0390 Unspecified dementia without behavioral disturbance: Secondary | ICD-10-CM | POA: Diagnosis not present

## 2017-04-18 DIAGNOSIS — R55 Syncope and collapse: Secondary | ICD-10-CM | POA: Diagnosis not present

## 2017-04-18 DIAGNOSIS — R1310 Dysphagia, unspecified: Secondary | ICD-10-CM | POA: Diagnosis not present

## 2017-04-18 DIAGNOSIS — M6281 Muscle weakness (generalized): Secondary | ICD-10-CM | POA: Diagnosis not present

## 2017-04-18 DIAGNOSIS — G4700 Insomnia, unspecified: Secondary | ICD-10-CM | POA: Diagnosis not present

## 2017-04-18 DIAGNOSIS — F209 Schizophrenia, unspecified: Secondary | ICD-10-CM | POA: Diagnosis not present

## 2017-04-19 DIAGNOSIS — R1310 Dysphagia, unspecified: Secondary | ICD-10-CM | POA: Diagnosis not present

## 2017-04-19 DIAGNOSIS — R262 Difficulty in walking, not elsewhere classified: Secondary | ICD-10-CM | POA: Diagnosis not present

## 2017-04-19 DIAGNOSIS — R55 Syncope and collapse: Secondary | ICD-10-CM | POA: Diagnosis not present

## 2017-04-19 DIAGNOSIS — R279 Unspecified lack of coordination: Secondary | ICD-10-CM | POA: Diagnosis not present

## 2017-04-19 DIAGNOSIS — R1311 Dysphagia, oral phase: Secondary | ICD-10-CM | POA: Diagnosis not present

## 2017-04-19 DIAGNOSIS — M6281 Muscle weakness (generalized): Secondary | ICD-10-CM | POA: Diagnosis not present

## 2017-04-26 ENCOUNTER — Non-Acute Institutional Stay (SKILLED_NURSING_FACILITY): Payer: Medicare Other | Admitting: Adult Health

## 2017-04-26 ENCOUNTER — Encounter: Payer: Self-pay | Admitting: Adult Health

## 2017-04-26 DIAGNOSIS — I2782 Chronic pulmonary embolism: Secondary | ICD-10-CM | POA: Diagnosis not present

## 2017-04-26 DIAGNOSIS — S42122D Displaced fracture of acromial process, left shoulder, subsequent encounter for fracture with routine healing: Secondary | ICD-10-CM | POA: Diagnosis not present

## 2017-04-26 DIAGNOSIS — E041 Nontoxic single thyroid nodule: Secondary | ICD-10-CM

## 2017-04-26 DIAGNOSIS — G309 Alzheimer's disease, unspecified: Secondary | ICD-10-CM | POA: Diagnosis not present

## 2017-04-26 DIAGNOSIS — F0280 Dementia in other diseases classified elsewhere without behavioral disturbance: Secondary | ICD-10-CM

## 2017-04-26 DIAGNOSIS — I6930 Unspecified sequelae of cerebral infarction: Secondary | ICD-10-CM | POA: Diagnosis not present

## 2017-04-26 DIAGNOSIS — J438 Other emphysema: Secondary | ICD-10-CM | POA: Diagnosis not present

## 2017-04-26 DIAGNOSIS — F209 Schizophrenia, unspecified: Secondary | ICD-10-CM | POA: Diagnosis not present

## 2017-04-26 DIAGNOSIS — I10 Essential (primary) hypertension: Secondary | ICD-10-CM | POA: Diagnosis not present

## 2017-04-26 NOTE — Progress Notes (Signed)
Provider:  Synthia Innocent, NP Location:  [facility] Room Number: 214 B Place of Service:  SNF (31)   PCP: Kirt Boys, DO Patient Care Team: Kirt Boys, DO as PCP - General (Internal Medicine) Chilton Si Chong Sicilian, NP as Nurse Practitioner (Geriatric Medicine) Center, [facility] (Skilled [facility] Facility)  Extended Emergency Contact Information Primary Emergency Contact: [name] [place] Home Phone: [phone] Relation: Daughter Secondary Emergency Contact: [name],[name]  [place] Mobile Phone: [phone] Relation: Grandaughter  Code Status: DNR Goals of Care: Advanced Directive information Advanced Directives 04/26/2017  Does Patient Have a Medical Advance Directive? Yes  Type of Advance Directive Out of facility DNR (pink MOST or yellow form)  Does patient want to make changes to medical advance directive? No - Patient declined  Copy of Healthcare Power of Attorney in Chart? -  Would patient like information on creating a medical advance directive? -  Pre-existing out of facility DNR order (yellow form or pink MOST form) Pink MOST form placed in chart (order not valid for inpatient use);Yellow form placed in chart (order not valid for inpatient use)      Allergies  Allergen Reactions  . Tramadol Other (See Comments)    twitching     Chief Complaint  Patient presents with  . Annual Exam    Yearly exam    HPI: Patient is a 83 y.o. female seen today for an annual comprehensive examination. This past year she has not been hospitalized in the past year. She did fall this past year and did suffer a left shoulder fracture from which she has recovered from. She is now mainly in a wheelchair. She cannot fully participate in the hpi or ros. There are concerns for any change in appetite; altered mental status; or pain. There are no nursing concerns at this time.     Past Medical History:  Diagnosis Date    . COPD (chronic obstructive pulmonary disease) (HCC)   . Dementia   . Depression   . Dysphagia   . Hypertension   . Scoliosis    Past Surgical History:  Procedure Laterality Date  . ABDOMINAL HYSTERECTOMY    . KNEE SURGERY    . SHOULDER SURGERY    . VITRECTOMY AND CATARACT      reports that she has never smoked. She has never used smokeless tobacco. She reports that she does not drink alcohol or use drugs. Social History   Social History  . Marital status: Single    Spouse name: N/A  . Number of children: N/A  . Years of education: N/A   Occupational History  . Not on file.   Social History Main Topics  . Smoking status: Never Smoker  . Smokeless tobacco: Never Used  . Alcohol use No  . Drug use: No  . Sexual activity: Not on file   Other Topics Concern  . Not on file   Social History Narrative  . No narrative on file   Family History  Problem Relation Age of Onset  . Stroke Neg Hx     Vitals:   04/26/17 0847  BP: 126/64  Pulse: 68  Resp: 18  Temp: 97.9 F (36.6 C)  SpO2: 100%  Weight: 134 lb (60.8 kg)  Height:  (1.549 m)   Body mass index is 25.32 kg/m.  Outpatient Encounter Prescriptions as of 04/26/2017  Medication Sig  . apixaban (ELIQUIS) 2.5 MG TABS tablet Take 2.5 mg by mouth 2 (two) times  daily.  . busPIRone (BUSPAR) 15 MG tablet Give 7.5 mg by mouth one time a day and 15 mg by mouth at bedtime  . donepezil (ARICEPT) 10 MG tablet Take 1 tablet (10 mg total) by mouth at bedtime.  Marland Kitchen HYDROcodone-acetaminophen (NORCO) 5-325 MG tablet Take 1 tablet by mouth every 6 (six) hours as needed.  . Lidocaine 4 % PTCH Place 1 patch onto the skin daily. Apply to lower back.  Remove & Discard patch within 12 hours or as directed by MD  . memantine (NAMENDA XR) 28 MG CP24 24 hr capsule Take 28 mg by mouth daily.  . metoprolol succinate (TOPROL-XL) 50 MG 24 hr tablet Take 1 tablet (50 mg total) by mouth daily. Take with or immediately following a meal.   . risperiDONE (RISPERDAL) 0.5 MG tablet Take 0.5 mg by mouth at bedtime.   No facility-administered encounter medications on file as of 04/26/2017.       SIGNIFICANT DIAGNOSTIC EXAMS  02-18-16: carotid ultrasound: Bilateral: intimal wall thickening CCA. Mild soft plaque origin ICA. 1-39% ICA plaquing. Vertebral artery flow is antegrade.  02-12-17: chest x-ray: Cardiomegaly and mild interstitial edema. No focal airspace consolidation.  02-12-17:  ct of head and cervical spine: 1. Chronic small vessel ischemic disease. No acute intracranial abnormality. 2. Small chronic caudate lacunar infarct on the right. 3. Cerebral atrophy. 4. Calcified frontal meningioma.  02-13-17: thyroid ultrasound: Multinodular goiter. Two dominant nodules occupying a large portion of the left thyroid lobe. Both of these nodules meet criteria for biopsy. Recommend clinical correlation with regards to the need for tissue sampling based on patient's age and comorbidities.   02-14-17: ct angio of chest: 1. No evidence of pulmonary embolus. 2. Mild bibasilar atelectasis or scarring noted. Mild interstitial prominence seen. Mild scarring at the lung apices. 3. Cardiomegaly.  Diffuse coronary artery calcifications seen. 4. Small pericardial effusion. 5. Large left thyroid mass, measuring perhaps 4.4 cm in size. This may be increased in size from 2017. Recommend further evaluation with thyroid ultrasound. If patient is clinically hyperthyroid, consider nuclear medicine thyroid uptake and scan. 6. Scattered aortic atherosclerosis.  02-14-17: TEE: - Left ventricle: Global longitudinal strai is -20.2%. The cavity size was normal. Wall thickness was increased in a pattern of mild LVH. Systolic function was normal. The estimated ejection fraction was in the range of 60% to 65%. Doppler parameters are consistent with abnormal left ventricular relaxation (grade 1  diastolic dysfunction). - Mitral valve: There was mild to moderate  regurgitation. - Right atrium: The atrium was mildly dilated. - Tricuspid valve: There was mild-moderate regurgitation. - Pulmonary arteries: PA peak pressure: 40 mm Hg (S). - Pericardium, extracardiac: A trivial pericardial effusion was  identified.   02-15-17 ct head and cervical spine: 1. No acute intracranial abnormality. No skull fracture. Stable atrophy, chronic and remote ischemia. 2. Chronic and degenerative change in the cervical spine without evidence of acute fracture  02-15-17: left shoulder x-ray: Apparent mildly displaced fracture of the left acromion on a single view. Would correlate for associated symptoms.  02-15-17: left elbow: No evidence of fracture or dislocation.  02-15-17: pelvic x-ray: No evidence of fracture or dislocation.  02-28-17: left shoulder x-ray: unremarkable prosthesis. Evidence of old trauma    03-07-17: right shoulder ct scan: 1. No acute injury of the right shoulder. 2. Right shoulder arthroplasty without hardware failure or complication  03-07-17: left shoulder ct scan: 1. Displaced fracture of the base of the acromion extending into the remainder of the acromion  process with comminution. No significant callus formation. 2. Left shoulder arthroplasty without hardware failure or complication.  NO NEW EXAMS     LABS REVIEWED: PREVIOUS LABS    04-14-16; wbc 8.2; hgb 12.7; hct 41.2; mcv 88.2; plt 244; glucose 106; bun 13.1; create 0.92; k+ 3.8; na++ 146; liver normal albumin 3.8 mag 2.1    10-17-16; wbc 5.2; hgb 14.0; hct 44.3; mcv 89.1; plt 217; glucose 126; bun 21.6; creat 1.03; k+ 4.0; na++ 145; liver normal albumin 4.1; chol 193; ldl 116; trig 75; hdl 62; hgb a1c 6.3  12-06-16: wbc 7.1; hgb 13.8; hct 43.2 mcv 89.9; plt 219; glucose 123; bun 27.1; creat 1.23; k+ 4.5 ;na++ 144 ca 9.8  02-12-17: wbc 7.3; hgb 11.6; hct 36.7; mcv 88.7; plt 157; glucose 108; bun 21; creat 1.45; k+ 3.6; na+ 143; ca 9.0; liver normal albumin 3.3; d-dimer 0.50; mag 2.0 02-13-17: glucose 101;  bun 17; creat 1.22; k+ 3.7; na++ 143; ca 9.0; tsh 1.807 02-14-17: wbc 5.9; hgb 12.4; hct 38.5; mcv 87.7; plt 165; glucose 99; bun 15; creat 1.01; k+ 3.7; na++ 143; ca 8.8  03-13-17: wbc 6.5; hgb 12.3; hct 36.9; mcv 86.8; plt 182; glucose 93; bun 34; creat 1.68; k+ 4.0; na++ 143; ca 9.0    NO NEW LABS   Review of Systems  Unable to perform ROS: Dementia (unable to answer questions )   Physical Exam  Constitutional: No distress.  Thin   Eyes: Conjunctivae are normal.  Neck: Neck supple. No JVD present. No thyromegaly present.  Cardiovascular: Normal rate, regular rhythm and intact distal pulses.   Respiratory: Effort normal and breath sounds normal. No respiratory distress. She has no wheezes.  GI: Soft. Bowel sounds are normal. She exhibits no distension. There is no tenderness.  Musculoskeletal: She exhibits no edema.  Able to move all extremities  Has neck contracture   Lymphadenopathy:    She has no cervical adenopathy.  Neurological: She is alert.  Skin: Skin is warm and dry. She is not diaphoretic.  Psychiatric: She has a normal mood and affect.    ASSESSMENT/ PLAN:  FOR TODAY    1. COPD: is stable is currently not on medications will continue to monitor her status.   2. Vascular dementia: she is without change in her status; her weight is 126 pounds: will continue aricept 10 mg nightly and namenda xr 28 mg daily will monitor   3. CKD stage III: is stable bun 15; creat 1.01 will monitor   4. Anxiety: she is emotionally stable will continue buspar 7.5 mg in the AM and 15 mg in the PM.     5. schizophrenia: no change in her status she is currently without obvious hallucinations; will lower risperdal to 0.25 mg   6. Left acromial process fracture: has healed will stop vicodin   7. Chronic pulmonary embolism: her respiratory status is stable will continue eliquis 2.5 mg twice daily  8. Chronic arterial  ischemic stroke: is neurology stable will continue eliquis 2.5 mg twice  daily   9. Hypertension: b/p 126/64 is stable will continue toprol xl 50 mg daily  10. Thyroid nodule: her tsh is normal at 1.807: stable  due to her advanced age and advanced dementia: I have spoken with Dr. Montez Morita; will not proceed with any further workup as she will not be able to participate in the workup. Will continue to monitor her status.  Her health maintenance is up to date  Time spent with patient; staff  and MEDICAL RECORD [id number] minutes; involving reviewing medical record talking with staff and patient exam.   MD is aware of resident's narcotic use and is in agreement with current plan of care. We will wean dosage as appropriate for resident     Synthia Innocent NP [facility]  Contact [phone] Monday through Friday 8am- 5pm  After hours call [phone]

## 2017-05-22 DIAGNOSIS — G4700 Insomnia, unspecified: Secondary | ICD-10-CM | POA: Diagnosis not present

## 2017-05-22 DIAGNOSIS — F419 Anxiety disorder, unspecified: Secondary | ICD-10-CM | POA: Diagnosis not present

## 2017-05-22 DIAGNOSIS — F29 Unspecified psychosis not due to a substance or known physiological condition: Secondary | ICD-10-CM | POA: Diagnosis not present

## 2017-05-22 DIAGNOSIS — F0390 Unspecified dementia without behavioral disturbance: Secondary | ICD-10-CM | POA: Diagnosis not present

## 2017-05-23 ENCOUNTER — Non-Acute Institutional Stay (SKILLED_NURSING_FACILITY): Payer: Medicare Other | Admitting: Internal Medicine

## 2017-05-23 ENCOUNTER — Encounter: Payer: Self-pay | Admitting: Internal Medicine

## 2017-05-23 DIAGNOSIS — M25512 Pain in left shoulder: Secondary | ICD-10-CM

## 2017-05-23 DIAGNOSIS — G8929 Other chronic pain: Secondary | ICD-10-CM

## 2017-05-23 DIAGNOSIS — I5032 Chronic diastolic (congestive) heart failure: Secondary | ICD-10-CM

## 2017-05-23 DIAGNOSIS — I10 Essential (primary) hypertension: Secondary | ICD-10-CM | POA: Diagnosis not present

## 2017-05-23 DIAGNOSIS — G309 Alzheimer's disease, unspecified: Secondary | ICD-10-CM

## 2017-05-23 DIAGNOSIS — Z79899 Other long term (current) drug therapy: Secondary | ICD-10-CM | POA: Diagnosis not present

## 2017-05-23 DIAGNOSIS — Z8673 Personal history of transient ischemic attack (TIA), and cerebral infarction without residual deficits: Secondary | ICD-10-CM

## 2017-05-23 DIAGNOSIS — M25511 Pain in right shoulder: Secondary | ICD-10-CM | POA: Diagnosis not present

## 2017-05-23 DIAGNOSIS — F0280 Dementia in other diseases classified elsewhere without behavioral disturbance: Secondary | ICD-10-CM

## 2017-05-23 DIAGNOSIS — F209 Schizophrenia, unspecified: Secondary | ICD-10-CM

## 2017-05-23 DIAGNOSIS — I2782 Chronic pulmonary embolism: Secondary | ICD-10-CM

## 2017-05-23 DIAGNOSIS — F411 Generalized anxiety disorder: Secondary | ICD-10-CM | POA: Diagnosis not present

## 2017-05-23 NOTE — Progress Notes (Signed)
Patient ID: Roberta Griffin, female   DOB: 1926/04/06, 83 y.o.   MRN: [id number]     DATE:  May 23, 2017  Location:   [place] Room Number: 78 B Place of Service: SNF (31)   Extended Emergency Contact Information Primary Emergency Contact: [place] Phone: [phone] Relation: Daughter Secondary Emergency Contact: [name],[name]  [place] Mobile Phone: [phone] Relation: Grandaughter  Advanced Directive information Does Patient Have a Medical Advance Directive?: Yes, Type of Advance Directive: Out of facility DNR (pink MOST or yellow form), Pre-existing out of facility DNR order (yellow form or pink MOST form): Pink MOST form placed in chart (order not valid for inpatient use);Yellow form placed in chart (order not valid for inpatient use), Does patient want to make changes to medical advance directive?: No - Patient declined  Chief Complaint  Patient presents with  . Medical Management of Chronic Issues    1 month follow up    HPI:  83 yo female long term resident seen today for f/u. She c/o b/l shoulder pain. She declines additional pain control. She states "I'm just old". No nursing issues. Appetite reduced. Sleeps well.  COPD - stable off medications.  Vascular dementia - stable on aricept 10 mg nightly and namenda xr 28 mg daily. Weight is stable  CKD - stage 3 and Borderline stage 4. Cr 1.68  GAD - mood stable on buspar 7.5 mg in the AM and 15 mg in the PM.     Schizophrenia - stable with no current hallucinations; she gets risperdal 0.25 mg and does benefit from this dose  Hx Left acromial process fracture - now healed  Chronic PE - stable on eliquis 2.5 mg twice daily  hx ischemic arterial stroke - stable on eliquis 2.5 mg twice daily   Hypertension - BP stable on toprol xl 50 mg daily  Hx Thyroid nodule - TSH 1.807. She is without sx's.     Past Medical History:  Diagnosis  Date  . COPD (chronic obstructive pulmonary disease) ([place])   . Dementia   . Depression   . Dysphagia   . Hypertension   . Scoliosis     Past Surgical History:  Procedure Laterality Date  . ABDOMINAL HYSTERECTOMY    . KNEE SURGERY    . SHOULDER SURGERY    . VITRECTOMY AND CATARACT      Patient Care Team: Gildardo Cranker, DO as PCP - General (Internal Medicine) Nyoka Cowden Phylis Bougie, NP as Nurse Practitioner ([place]) [facility], [place] [facility])  Social History   Social History  . Marital status: Single    Spouse name: N/A  . Number of children: N/A  . Years of education: N/A   Occupational History  . Not on file.   Social History Main Topics  . Smoking status: Never Smoker  . Smokeless tobacco: Never Used  . Alcohol use No  . Drug use: No  . Sexual activity: Not on file   Other Topics Concern  . Not on file   Social History Narrative  . No narrative on file     reports that she has never smoked. She has never used smokeless tobacco. She reports that she does not drink alcohol or use drugs.  Family History  Problem Relation Age of Onset  . Stroke Neg Hx    Family Status  Relation Status  . Mother Deceased  . Father Deceased  . Neg Hx (Not  Specified)    Immunization History  Administered Date(s) Administered  . Influenza-Unspecified 05/20/2016  . PPD Test 02/24/2016, 05/04/2016, 05/11/2016    Allergies  Allergen Reactions  . Tramadol Other (See Comments)    twitching    Medications: Patient's Medications  New Prescriptions   No medications on file  Previous Medications   APIXABAN (ELIQUIS) 2.5 MG TABS TABLET    Take 2.5 mg by mouth 2 (two) times daily.   BUSPIRONE (BUSPAR) 15 MG TABLET    Give 7.5 mg by mouth one time a day and 15 mg by mouth at bedtime   DONEPEZIL (ARICEPT) 10 MG TABLET    Take 1 tablet (10 mg total) by mouth at bedtime.   LIDOCAINE 4 % PTCH    Place 1 patch onto the skin daily. Apply to lower  back.  Remove & Discard patch within 12 hours or as directed by MD   MEMANTINE (NAMENDA XR) 28 MG CP24 24 HR CAPSULE    Take 28 mg by mouth daily.   METOPROLOL SUCCINATE (TOPROL-XL) 50 MG 24 HR TABLET    Take 1 tablet (50 mg total) by mouth daily. Take with or immediately following a meal.   RISPERIDONE (RISPERDAL) 0.25 MG TABLET    Take 0.25 mg by mouth at bedtime.   Modified Medications   No medications on file  Discontinued Medications   HYDROCODONE-ACETAMINOPHEN (NORCO) 5-325 MG TABLET    Take 1 tablet by mouth every 6 (six) hours as needed.    Review of Systems  Unable to perform ROS: Dementia    Vitals:   05/23/17 1014  BP: 122/78  Pulse: 77  Resp: 18  Temp: 98.2 F (36.8 C)  SpO2: 98%  Weight: 135 lb (61.2 kg)  Height: 5' 1"  (1.549 m)   Body mass index is 25.51 kg/m.  Physical Exam  Constitutional: She appears well-developed.  Frail appearing in NAD, sitting on bed  HENT:  Mouth/Throat: Oropharynx is clear and moist. No oropharyngeal exudate.  MMM; no oral thrush  Eyes: Pupils are equal, round, and reactive to light. No scleral icterus.  Neck: Neck supple. Carotid bruit is not present. No tracheal deviation present. No thyromegaly present.  Cardiovascular: Normal rate, regular rhythm and intact distal pulses.  Exam reveals no gallop and no friction rub.   Murmur (1/6 SEM) heard. No LE edema b/l. no calf TTP.   Pulmonary/Chest: Effort normal and breath sounds normal. No stridor. No respiratory distress. She has no wheezes. She has no rales.  Abdominal: Soft. Normal appearance and bowel sounds are normal. She exhibits no distension and no mass. There is no hepatomegaly. There is no tenderness. There is no rigidity, no rebound and no guarding. No hernia.  Musculoskeletal: She exhibits edema and tenderness.  Reduced L>R shoulder ROM with TTP on movement  Lymphadenopathy:    She has no cervical adenopathy.  Neurological: She is alert. Gait (antalgic) abnormal.  Skin:  Skin is warm and dry. No rash noted.  Psychiatric: She has a normal mood and affect. Her behavior is normal. Thought content is delusional (occasional).     Labs reviewed: Admission on 03/13/2017, Discharged on 03/13/2017  Component Date Value Ref Range Status  . Sodium 03/13/2017 144  135 - 145 mmol/L Final  . Potassium 03/13/2017 4.1  3.5 - 5.1 mmol/L Final  . Chloride 03/13/2017 105  101 - 111 mmol/L Final  . BUN 03/13/2017 32* 6 - 20 mg/dL Final  . Creatinine, Ser 03/13/2017 1.70* 0.44 - 1.00  mg/dL Final  . Glucose, Bld 03/13/2017 86  65 - 99 mg/dL Final  . Calcium, Ion 03/13/2017 1.17  1.15 - 1.40 mmol/L Final  . TCO2 03/13/2017 25  0 - 100 mmol/L Final  . Hemoglobin 03/13/2017 13.3  12.0 - 15.0 g/dL Final  . HCT 03/13/2017 39.0  36.0 - 46.0 % Final  . Sodium 03/13/2017 143  135 - 145 mmol/L Final  . Potassium 03/13/2017 4.0  3.5 - 5.1 mmol/L Final  . Chloride 03/13/2017 109  101 - 111 mmol/L Final  . CO2 03/13/2017 26  22 - 32 mmol/L Final  . Glucose, Bld 03/13/2017 93  65 - 99 mg/dL Final  . BUN 03/13/2017 34* 6 - 20 mg/dL Final  . Creatinine, Ser 03/13/2017 1.68* 0.44 - 1.00 mg/dL Final  . Calcium 03/13/2017 9.0  8.9 - 10.3 mg/dL Final  . GFR calc non Af Amer 03/13/2017 26* >60 mL/min Final  . GFR calc Af Amer 03/13/2017 30* >60 mL/min Final   Comment: (NOTE) The eGFR has been calculated using the CKD EPI equation. This calculation has not been validated in all clinical situations. eGFR's persistently <60 mL/min signify possible Chronic Kidney Disease.   . Anion gap 03/13/2017 8  5 - 15 Final  . WBC 03/13/2017 6.5  4.0 - 10.5 K/uL Final  . RBC 03/13/2017 4.25  3.87 - 5.11 MIL/uL Final  . Hemoglobin 03/13/2017 12.3  12.0 - 15.0 g/dL Final  . HCT 03/13/2017 36.9  36.0 - 46.0 % Final  . MCV 03/13/2017 86.8  78.0 - 100.0 fL Final  . MCH 03/13/2017 28.9  26.0 - 34.0 pg Final  . MCHC 03/13/2017 33.3  30.0 - 36.0 g/dL Final  . RDW 03/13/2017 15.0  11.5 - 15.5 % Final  .  Platelets 03/13/2017 182  150 - 400 K/uL Final  . Neutrophils Relative % 03/13/2017 55  % Final  . Neutro Abs 03/13/2017 3.7  1.7 - 7.7 K/uL Final  . Lymphocytes Relative 03/13/2017 26  % Final  . Lymphs Abs 03/13/2017 1.7  0.7 - 4.0 K/uL Final  . Monocytes Relative 03/13/2017 15  % Final  . Monocytes Absolute 03/13/2017 1.0  0.1 - 1.0 K/uL Final  . Eosinophils Relative 03/13/2017 3  % Final  . Eosinophils Absolute 03/13/2017 0.2  0.0 - 0.7 K/uL Final  . Basophils Relative 03/13/2017 1  % Final  . Basophils Absolute 03/13/2017 0.0  0.0 - 0.1 K/uL Final  . Color, Urine 03/13/2017 YELLOW  YELLOW Final  . APPearance 03/13/2017 CLEAR  CLEAR Final  . Specific Gravity, Urine 03/13/2017 1.020  1.005 - 1.030 Final  . pH 03/13/2017 5.0  5.0 - 8.0 Final  . Glucose, UA 03/13/2017 NEGATIVE  NEGATIVE mg/dL Final  . Hgb urine dipstick 03/13/2017 NEGATIVE  NEGATIVE Final  . Bilirubin Urine 03/13/2017 NEGATIVE  NEGATIVE Final  . Ketones, ur 03/13/2017 NEGATIVE  NEGATIVE mg/dL Final  . Protein, ur 03/13/2017 NEGATIVE  NEGATIVE mg/dL Final  . Nitrite 03/13/2017 NEGATIVE  NEGATIVE Final  . Leukocytes, UA 03/13/2017 NEGATIVE  NEGATIVE Final    No results found.   Assessment/Plan   ICD-10-CM   1. High risk medication use Z79.899   2. Chronic pain of both shoulders M25.511    G89.29    M25.512   3. Schizophrenia, unspecified type ([place]) F20.9   4. Other chronic pulmonary embolism without acute cor pulmonale (HCC) I27.82   5. History of stroke Z86.73   6. Chronic diastolic CHF (congestive heart failure) (  [place]) I50.32   7. Alzheimer's dementia without behavioral disturbance, unspecified timing of dementia onset G30.9    F02.80   8. Essential hypertension I10   9. GAD (generalized anxiety disorder) F41.1     Check cmp, cbc due to high risk med use  Cont current meds as ordered  PT/Ot/ST as indicated  Nutritional supplements as ordered  Will follow   Monica S. Perlie Gold    [facility] [address] [place], [place] [address] [phone] Cell (Monday-Friday 8 AM - 5 PM) [phone] After 5 PM and follow prompts

## 2017-06-03 DIAGNOSIS — G8929 Other chronic pain: Secondary | ICD-10-CM | POA: Insufficient documentation

## 2017-06-03 DIAGNOSIS — M25511 Pain in right shoulder: Secondary | ICD-10-CM

## 2017-06-03 DIAGNOSIS — M25512 Pain in left shoulder: Secondary | ICD-10-CM

## 2017-06-03 DIAGNOSIS — Z79899 Other long term (current) drug therapy: Secondary | ICD-10-CM | POA: Insufficient documentation

## 2017-06-10 DIAGNOSIS — Z23 Encounter for immunization: Secondary | ICD-10-CM | POA: Diagnosis not present

## 2017-06-24 DIAGNOSIS — M79675 Pain in left toe(s): Secondary | ICD-10-CM | POA: Diagnosis not present

## 2017-06-24 DIAGNOSIS — B351 Tinea unguium: Secondary | ICD-10-CM | POA: Diagnosis not present

## 2017-06-24 DIAGNOSIS — M79674 Pain in right toe(s): Secondary | ICD-10-CM | POA: Diagnosis not present

## 2017-07-01 ENCOUNTER — Encounter: Payer: Self-pay | Admitting: Adult Health

## 2017-07-01 ENCOUNTER — Non-Acute Institutional Stay (SKILLED_NURSING_FACILITY): Payer: Medicare Other | Admitting: Adult Health

## 2017-07-01 DIAGNOSIS — F209 Schizophrenia, unspecified: Secondary | ICD-10-CM | POA: Diagnosis not present

## 2017-07-01 DIAGNOSIS — I639 Cerebral infarction, unspecified: Secondary | ICD-10-CM

## 2017-07-01 DIAGNOSIS — I2782 Chronic pulmonary embolism: Secondary | ICD-10-CM

## 2017-07-01 DIAGNOSIS — I10 Essential (primary) hypertension: Secondary | ICD-10-CM

## 2017-07-01 NOTE — Progress Notes (Signed)
Location:   [facility] Room Number: 214 B Place of Service:  SNF (31)   CODE STATUS: DNR  Allergies  Allergen Reactions  . Tramadol Other (See Comments)    twitching    Chief Complaint  Patient presents with  . Medical Management of Chronic Issues    Chronic pulmonary embolism; cva; hypertension; schizophrenia    HPI:  She is a 83 year old long term resident of this facility being seen for the management of her chronic illnesses: chronic pulmonary embolism; essential hypertension; cva; schizophrenia. She is unable to participate in the hpi or ros. There are no reports of chest pain; shortness of breath; headaches; or changes in behaviors. There are no nursing concerns at this time.   Past Medical History:  Diagnosis Date  . COPD (chronic obstructive pulmonary disease) (HCC)   . Dementia   . Depression   . Dysphagia   . Hypertension   . Scoliosis     Past Surgical History:  Procedure Laterality Date  . ABDOMINAL HYSTERECTOMY    . KNEE SURGERY    . SHOULDER SURGERY    . VITRECTOMY AND CATARACT      Social History   Socioeconomic History  . Marital status: Single    Spouse name: Not on file  . Number of children: Not on file  . Years of education: Not on file  . Highest education level: Not on file  Social Needs  . Financial resource strain: Not on file  . Food insecurity - worry: Not on file  . Food insecurity - inability: Not on file  . Transportation needs - medical: Not on file  . Transportation needs - non-medical: Not on file  Occupational History  . Not on file  Tobacco Use  . Smoking status: Never Smoker  . Smokeless tobacco: Never Used  Substance and Sexual Activity  . Alcohol use: No  . Drug use: No  . Sexual activity: Not on file  Other Topics Concern  . Not on file  Social History Narrative  . Not on file   Family History  Problem Relation Age of Onset  . Stroke Neg Hx       VITAL SIGNS BP 120/80   Pulse 76   Ht 5'  1" (1.549 m)   Wt 136 lb 12.8 oz (62.1 kg)   BMI 25.85 kg/m    Outpatient Encounter Medications as of 07/01/2017  Medication Sig  . apixaban (ELIQUIS) 2.5 MG TABS tablet Take 2.5 mg by mouth 2 (two) times daily.  . busPIRone (BUSPAR) 15 MG tablet Give 7.5 mg by mouth one time a day and 15 mg by mouth at bedtime  . donepezil (ARICEPT) 10 MG tablet Take 1 tablet (10 mg total) by mouth at bedtime.  . memantine (NAMENDA XR) 28 MG CP24 24 hr capsule Take 28 mg by mouth daily.  . metoprolol succinate (TOPROL-XL) 50 MG 24 hr tablet Take 1 tablet (50 mg total) by mouth daily. Take with or immediately following a meal.  . risperiDONE (RISPERDAL) 0.25 MG tablet Take 0.25 mg by mouth at bedtime.    No facility-administered encounter medications on file as of 07/01/2017.      SIGNIFICANT DIAGNOSTIC EXAMS  02-18-16: carotid ultrasound: Bilateral: intimal wall thickening CCA. Mild soft plaque origin ICA. 1-39% ICA plaquing. Vertebral artery flow is antegrade.  02-12-17: chest x-ray: Cardiomegaly and mild interstitial edema. No focal airspace consolidation.  02-12-17:  ct of head and cervical spine: 1. Chronic small vessel  ischemic disease. No acute intracranial abnormality. 2. Small chronic caudate lacunar infarct on the right. 3. Cerebral atrophy. 4. Calcified frontal meningioma.  02-13-17: thyroid ultrasound: Multinodular goiter. Two dominant nodules occupying a large portion of the left thyroid lobe. Both of these nodules meet criteria for biopsy. Recommend clinical correlation with regards to the need for tissue sampling based on patient's age and comorbidities.   02-14-17: ct angio of chest: 1. No evidence of pulmonary embolus. 2. Mild bibasilar atelectasis or scarring noted. Mild interstitial prominence seen. Mild scarring at the lung apices. 3. Cardiomegaly.  Diffuse coronary artery calcifications seen. 4. Small pericardial effusion. 5. Large left thyroid mass, measuring perhaps 4.4 cm in size.  This may be increased in size from 2017. Recommend further evaluation with thyroid ultrasound. If patient is clinically hyperthyroid, consider nuclear medicine thyroid uptake and scan. 6. Scattered aortic atherosclerosis.  02-14-17: TEE: - Left ventricle: Global longitudinal strai is -20.2%. The cavity size was normal. Wall thickness was increased in a pattern of mild LVH. Systolic function was normal. The estimated ejection fraction was in the range of 60% to 65%. Doppler parameters are consistent with abnormal left ventricular relaxation (grade 1  diastolic dysfunction). - Mitral valve: There was mild to moderate regurgitation. - Right atrium: The atrium was mildly dilated. - Tricuspid valve: There was mild-moderate regurgitation. - Pulmonary arteries: PA peak pressure: 40 mm Hg (S). - Pericardium, extracardiac: A trivial pericardial effusion was  identified.   02-15-17 ct head and cervical spine: 1. No acute intracranial abnormality. No skull fracture. Stable atrophy, chronic and remote ischemia. 2. Chronic and degenerative change in the cervical spine without evidence of acute fracture  02-15-17: left shoulder x-ray: Apparent mildly displaced fracture of the left acromion on a single view. Would correlate for associated symptoms.  02-15-17: left elbow: No evidence of fracture or dislocation.  02-15-17: pelvic x-ray: No evidence of fracture or dislocation.  02-28-17: left shoulder x-ray: unremarkable prosthesis. Evidence of old trauma    03-07-17: right shoulder ct scan: 1. No acute injury of the right shoulder. 2. Right shoulder arthroplasty without hardware failure or complication  03-07-17: left shoulder ct scan: 1. Displaced fracture of the base of the acromion extending into the remainder of the acromion process with comminution. No significant callus formation. 2. Left shoulder arthroplasty without hardware failure or complication.  NO NEW EXAMS     LABS REVIEWED: PREVIOUS LABS    10-17-16;  wbc 5.2; hgb 14.0; hct 44.3; mcv 89.1; plt 217; glucose 126; bun 21.6; creat 1.03; k+ 4.0; na++ 145; liver normal albumin 4.1; chol 193; ldl 116; trig 75; hdl 62; hgb a1c 6.3  12-06-16: wbc 7.1; hgb 13.8; hct 43.2 mcv 89.9; plt 219; glucose 123; bun 27.1; creat 1.23; k+ 4.5 ;na++ 144 ca 9.8  02-12-17: wbc 7.3; hgb 11.6; hct 36.7; mcv 88.7; plt 157; glucose 108; bun 21; creat 1.45; k+ 3.6; na+ 143; ca 9.0; liver normal albumin 3.3; d-dimer 0.50; mag 2.0 02-13-17: glucose 101; bun 17; creat 1.22; k+ 3.7; na++ 143; ca 9.0; tsh 1.807 02-14-17: wbc 5.9; hgb 12.4; hct 38.5; mcv 87.7; plt 165; glucose 99; bun 15; creat 1.01; k+ 3.7; na++ 143; ca 8.8  03-13-17: wbc 6.5; hgb 12.3; hct 36.9; mcv 86.8; plt 182; glucose 93; bun 34; creat 1.68; k+ 4.0; na++ 143; ca 9.0    NO NEW LABS    Review of Systems  Unable to perform ROS: Dementia (unable to answer questions )    Physical Exam  Constitutional: No distress.  Thin   Neck: Neck supple. No thyromegaly present.  Cardiovascular: Normal rate, regular rhythm, normal heart sounds and intact distal pulses.  Pulmonary/Chest: Effort normal and breath sounds normal. No respiratory distress.  Abdominal: Soft. Bowel sounds are normal. She exhibits no distension. There is no tenderness.  Musculoskeletal: She exhibits no edema.  Able to move all extremities  Has neck contracture    Lymphadenopathy:    She has no cervical adenopathy.  Neurological: She is alert.  Skin: Skin is warm and dry. She is not diaphoretic.  Psychiatric: She has a normal mood and affect.   ASSESSMENT/ PLAN:   TODAY    1. schizophrenia: no change in her status she is currently without obvious hallucinations; will continue risperdal  0.25 mg   2. Chronic pulmonary embolism: her respiratory status is stable will continue eliquis 2.5 mg twice daily  3. Chronic arterial  ischemic stroke: is neurology stable will continue eliquis 2.5 mg twice daily   4. Hypertension: b/p 120/80 is stable will  continue toprol xl 50 mg daily  PREVIOUS  5. Thyroid nodule: her tsh is normal at 1.807: stable  due to her advanced age and advanced dementia: I have spoken with Dr. Montez Moritaarter; will not proceed with any further workup as she will not be able to participate in the workup. Will continue to monitor her status.  6. COPD: is stable is currently not on medications will continue to monitor her status.   7. Vascular dementia: she is without change in her status; her weight is 136 pounds: will continue aricept 10 mg nightly and namenda xr 28 mg daily will monitor   8. CKD stage III: is stable bun 15; creat 1.01 will monitor   9. Anxiety: she is emotionally stable will continue buspar 7.5 mg in the AM and 15 mg in the PM.    MD is aware of resident's narcotic use and is in agreement with current plan of care. We will attempt to wean resident as apropriate     Synthia Innocenteborah Green NP [facility]  Contact [phone] Monday through Friday 8am- 5pm  After hours call [phone]

## 2017-07-10 ENCOUNTER — Encounter: Payer: Self-pay | Admitting: Adult Health

## 2017-07-10 NOTE — Progress Notes (Signed)
Entered in error

## 2017-07-11 ENCOUNTER — Non-Acute Institutional Stay (SKILLED_NURSING_FACILITY): Payer: Medicare Other | Admitting: Adult Health

## 2017-07-11 ENCOUNTER — Encounter: Payer: Self-pay | Admitting: Adult Health

## 2017-07-11 DIAGNOSIS — G309 Alzheimer's disease, unspecified: Secondary | ICD-10-CM

## 2017-07-11 DIAGNOSIS — I639 Cerebral infarction, unspecified: Secondary | ICD-10-CM | POA: Diagnosis not present

## 2017-07-11 DIAGNOSIS — F209 Schizophrenia, unspecified: Secondary | ICD-10-CM

## 2017-07-11 DIAGNOSIS — F0280 Dementia in other diseases classified elsewhere without behavioral disturbance: Secondary | ICD-10-CM

## 2017-07-11 NOTE — Progress Notes (Signed)
Location:   [facility] Room Number: 214 B Place of Service:  SNF (31)   CODE STATUS: DNR  Allergies  Allergen Reactions  . Tramadol Other (See Comments)    twitching    Chief Complaint  Patient presents with  . Acute Visit    Care Plan Meeting    HPI:  We have come together for her routine care plan meeting with her family and care plan team. We have discussed her care plan. We did discuss her nursing concerns. We did discuss her overall medical status. She can feed herself and she can speak; but is unable to participate in the hpi or ros. Her family would like a palliative care consult.  We have spent 20 minutes discussing her advanced directives; including her MOST form. She does understand the pros and cons regarding her choices. The form is filled out and reviewed.  There are no reports of behavioral issues; changes in appetite; no sleep disturbances.  There are no nursing concerns at this time.    Past Medical History:  Diagnosis Date  . COPD (chronic obstructive pulmonary disease) (HCC)   . Dementia   . Depression   . Dysphagia   . Hypertension   . Scoliosis     Past Surgical History:  Procedure Laterality Date  . ABDOMINAL HYSTERECTOMY    . KNEE SURGERY    . SHOULDER SURGERY    . VITRECTOMY AND CATARACT      Social History   Socioeconomic History  . Marital status: Single    Spouse name: Not on file  . Number of children: Not on file  . Years of education: Not on file  . Highest education level: Not on file  Social Needs  . Financial resource strain: Not on file  . Food insecurity - worry: Not on file  . Food insecurity - inability: Not on file  . Transportation needs - medical: Not on file  . Transportation needs - non-medical: Not on file  Occupational History  . Not on file  Tobacco Use  . Smoking status: Never Smoker  . Smokeless tobacco: Never Used  Substance and Sexual Activity  . Alcohol use: No  . Drug use: No  . Sexual  activity: Not on file  Other Topics Concern  . Not on file  Social History Narrative  . Not on file   Family History  Problem Relation Age of Onset  . Stroke Neg Hx       VITAL SIGNS BP (!) 145/77   Pulse 64   Ht 5\' 1"  (1.549 m)   Wt 136 lb 12.8 oz (62.1 kg)   BMI 25.85 kg/m   Outpatient Encounter Medications as of 07/11/2017  Medication Sig  . apixaban (ELIQUIS) 2.5 MG TABS tablet Take 2.5 mg by mouth 2 (two) times daily.  . busPIRone (BUSPAR) 15 MG tablet Give 7.5 mg by mouth one time a day and 15 mg by mouth at bedtime  . donepezil (ARICEPT) 10 MG tablet Take 1 tablet (10 mg total) by mouth at bedtime.  . memantine (NAMENDA XR) 28 MG CP24 24 hr capsule Take 28 mg by mouth daily.  . metoprolol succinate (TOPROL-XL) 50 MG 24 hr tablet Take 1 tablet (50 mg total) by mouth daily. Take with or immediately following a meal.  . Nutritional Supplements (NUTRITIONAL SUPPLEMENT PO) HSG Regular Diet - HSG Mech Soft Texture, regular consistency  . risperiDONE (RISPERDAL) 0.25 MG tablet Take 0.25 mg by mouth at bedtime.  No facility-administered encounter medications on file as of 07/11/2017.      SIGNIFICANT DIAGNOSTIC EXAMS  02-18-16: carotid ultrasound: Bilateral: intimal wall thickening CCA. Mild soft plaque origin ICA. 1-39% ICA plaquing. Vertebral artery flow is antegrade.  02-12-17: chest x-ray: Cardiomegaly and mild interstitial edema. No focal airspace consolidation.  02-12-17:  ct of head and cervical spine: 1. Chronic small vessel ischemic disease. No acute intracranial abnormality. 2. Small chronic caudate lacunar infarct on the right. 3. Cerebral atrophy. 4. Calcified frontal meningioma.  02-13-17: thyroid ultrasound: Multinodular goiter. Two dominant nodules occupying a large portion of the left thyroid lobe. Both of these nodules meet criteria for biopsy. Recommend clinical correlation with regards to the need for tissue sampling based on patient's age and  comorbidities.   02-14-17: ct angio of chest: 1. No evidence of pulmonary embolus. 2. Mild bibasilar atelectasis or scarring noted. Mild interstitial prominence seen. Mild scarring at the lung apices. 3. Cardiomegaly.  Diffuse coronary artery calcifications seen. 4. Small pericardial effusion. 5. Large left thyroid mass, measuring perhaps 4.4 cm in size. This may be increased in size from 2017. Recommend further evaluation with thyroid ultrasound. If patient is clinically hyperthyroid, consider nuclear medicine thyroid uptake and scan. 6. Scattered aortic atherosclerosis.  02-14-17: TEE: - Left ventricle: Global longitudinal strai is -20.2%. The cavity size was normal. Wall thickness was increased in a pattern of mild LVH. Systolic function was normal. The estimated ejection fraction was in the range of 60% to 65%. Doppler parameters are consistent with abnormal left ventricular relaxation (grade 1  diastolic dysfunction). - Mitral valve: There was mild to moderate regurgitation. - Right atrium: The atrium was mildly dilated. - Tricuspid valve: There was mild-moderate regurgitation. - Pulmonary arteries: PA peak pressure: 40 mm Hg (S). - Pericardium, extracardiac: A trivial pericardial effusion was  identified.   02-15-17 ct head and cervical spine: 1. No acute intracranial abnormality. No skull fracture. Stable atrophy, chronic and remote ischemia. 2. Chronic and degenerative change in the cervical spine without evidence of acute fracture  02-15-17: left shoulder x-ray: Apparent mildly displaced fracture of the left acromion on a single view. Would correlate for associated symptoms.  02-15-17: left elbow: No evidence of fracture or dislocation.  02-15-17: pelvic x-ray: No evidence of fracture or dislocation.  02-28-17: left shoulder x-ray: unremarkable prosthesis. Evidence of old trauma    03-07-17: right shoulder ct scan: 1. No acute injury of the right shoulder. 2. Right shoulder arthroplasty  without hardware failure or complication  03-07-17: left shoulder ct scan: 1. Displaced fracture of the base of the acromion extending into the remainder of the acromion process with comminution. No significant callus formation. 2. Left shoulder arthroplasty without hardware failure or complication.  NO NEW EXAMS     LABS REVIEWED: PREVIOUS LABS    10-17-16; wbc 5.2; hgb 14.0; hct 44.3; mcv 89.1; plt 217; glucose 126; bun 21.6; creat 1.03; k+ 4.0; na++ 145; liver normal albumin 4.1; chol 193; ldl 116; trig 75; hdl 62; hgb a1c 6.3  12-06-16: wbc 7.1; hgb 13.8; hct 43.2 mcv 89.9; plt 219; glucose 123; bun 27.1; creat 1.23; k+ 4.5 ;na++ 144 ca 9.8  02-12-17: wbc 7.3; hgb 11.6; hct 36.7; mcv 88.7; plt 157; glucose 108; bun 21; creat 1.45; k+ 3.6; na+ 143; ca 9.0; liver normal albumin 3.3; d-dimer 0.50; mag 2.0 02-13-17: glucose 101; bun 17; creat 1.22; k+ 3.7; na++ 143; ca 9.0; tsh 1.807 02-14-17: wbc 5.9; hgb 12.4; hct 38.5; mcv 87.7; plt 165;  glucose 99; bun 15; creat 1.01; k+ 3.7; na++ 143; ca 8.8  03-13-17: wbc 6.5; hgb 12.3; hct 36.9; mcv 86.8; plt 182; glucose 93; bun 34; creat 1.68; k+ 4.0; na++ 143; ca 9.0    NO NEW LABS    Review of Systems  Unable to perform ROS: Dementia (unable to answer questions )     Physical Exam  Constitutional: No distress.  Thin   Neck: Neck supple. No thyromegaly present.  Cardiovascular: Normal rate, regular rhythm and intact distal pulses.  Murmur heard. 1/6  Pulmonary/Chest: Effort normal and breath sounds normal. No respiratory distress.  Abdominal: Soft. Bowel sounds are normal. She exhibits no distension. There is no tenderness.  Musculoskeletal: She exhibits no edema.  Has neck contracture Is able to move all extremities   Lymphadenopathy:    She has no cervical adenopathy.  Neurological: She is alert.  Skin: Skin is warm and dry. She is not diaphoretic.  Psychiatric: She has a normal mood and affect.    ASSESSMENT/ PLAN:   TODAY    1.  schizophrenia:  2. Chronic arterial  ischemic stroke:  3. Alzheimer's disease:  Will not make changes to her plan of care Will setup a palliative care consult. I highly doubt that she will be a candidate for hospice at this time  Time spent with patient and family: 40 minutes (20 minutes with advanced directives) her family has verbalized understanding of the MOST form; advances directives; medication regimen; and her overall medical status.     MD is aware of resident's narcotic use and is in agreement with current plan of care. We will attempt to wean resident as apropriate     Synthia Innocent NP [facility]  Contact [phone] Monday through Friday 8am- 5pm  After hours call [phone]

## 2017-07-12 DIAGNOSIS — R413 Other amnesia: Secondary | ICD-10-CM | POA: Diagnosis not present

## 2017-07-19 DIAGNOSIS — R262 Difficulty in walking, not elsewhere classified: Secondary | ICD-10-CM | POA: Diagnosis not present

## 2017-07-19 DIAGNOSIS — R1310 Dysphagia, unspecified: Secondary | ICD-10-CM | POA: Diagnosis not present

## 2017-07-19 DIAGNOSIS — R1311 Dysphagia, oral phase: Secondary | ICD-10-CM | POA: Diagnosis not present

## 2017-07-19 DIAGNOSIS — M6281 Muscle weakness (generalized): Secondary | ICD-10-CM | POA: Diagnosis not present

## 2017-07-19 DIAGNOSIS — R279 Unspecified lack of coordination: Secondary | ICD-10-CM | POA: Diagnosis not present

## 2017-07-19 DIAGNOSIS — R55 Syncope and collapse: Secondary | ICD-10-CM | POA: Diagnosis not present

## 2017-07-26 ENCOUNTER — Non-Acute Institutional Stay (SKILLED_NURSING_FACILITY): Payer: Medicare Other | Admitting: Adult Health

## 2017-07-26 ENCOUNTER — Encounter: Payer: Self-pay | Admitting: Adult Health

## 2017-07-26 DIAGNOSIS — F418 Other specified anxiety disorders: Secondary | ICD-10-CM | POA: Diagnosis not present

## 2017-07-26 DIAGNOSIS — N183 Chronic kidney disease, stage 3 unspecified: Secondary | ICD-10-CM

## 2017-07-26 DIAGNOSIS — E041 Nontoxic single thyroid nodule: Secondary | ICD-10-CM

## 2017-07-26 DIAGNOSIS — J438 Other emphysema: Secondary | ICD-10-CM

## 2017-07-26 DIAGNOSIS — G309 Alzheimer's disease, unspecified: Secondary | ICD-10-CM

## 2017-07-26 DIAGNOSIS — F0280 Dementia in other diseases classified elsewhere without behavioral disturbance: Secondary | ICD-10-CM

## 2017-07-26 NOTE — Progress Notes (Signed)
Patient ID: Roberta Griffin, female   DOB: 04/03/1926, 83 y.o.   MRN: [id number]  Location:   [facility] Room Number: 214 B Place of Service:  SNF (31)   CODE STATUS: DNR  Allergies  Allergen Reactions  . Tramadol Other (See Comments)    twitching    Chief Complaint  Patient presents with  . Medical Management of Chronic Issues    Thyroid nodule; copd; vascular dementia; ckd; depression with anxiety     HPI:  She is a 83 year old long term resident of this facility being seen for the management of her chronic illnesses: COPD; thyroid nodule; vascular dementia; ckd stage III; depression with anxiety. She is unable to fully participate in the hpi or ros. She did tell me that she is depressed; and wants to go to sleep and not wake up. She is more withdrawn. There are no reports of changes in appetite; no changes in sleep disturbance. She is denies having a plan for suicide   Past Medical History:  Diagnosis Date  . COPD (chronic obstructive pulmonary disease) (HCC)   . Dementia   . Depression   . Dysphagia   . Hypertension   . Scoliosis     Past Surgical History:  Procedure Laterality Date  . ABDOMINAL HYSTERECTOMY    . KNEE SURGERY    . SHOULDER SURGERY    . VITRECTOMY AND CATARACT      Social History   Socioeconomic History  . Marital status: Single    Spouse name: Not on file  . Number of children: Not on file  . Years of education: Not on file  . Highest education level: Not on file  Social Needs  . Financial resource strain: Not on file  . Food insecurity - worry: Not on file  . Food insecurity - inability: Not on file  . Transportation needs - medical: Not on file  . Transportation needs - non-medical: Not on file  Occupational History  . Not on file  Tobacco Use  . Smoking status: Never Smoker  . Smokeless tobacco: Never Used  Substance and Sexual Activity  . Alcohol use: No  . Drug use: No  . Sexual activity: Not on file  Other Topics  Concern  . Not on file  Social History Narrative  . Not on file   Family History  Problem Relation Age of Onset  . Stroke Neg Hx       VITAL SIGNS Ht 5\' 1"  (1.549 m)   Wt 133 lb 9.6 oz (60.6 kg)   BMI 25.24 kg/m    No other vital signs available   Outpatient Encounter Medications as of 07/26/2017  Medication Sig  . apixaban (ELIQUIS) 2.5 MG TABS tablet Take 2.5 mg by mouth 2 (two) times daily.  . busPIRone (BUSPAR) 15 MG tablet Give 7.5 mg by mouth one time a day and 15 mg by mouth at bedtime  . donepezil (ARICEPT) 10 MG tablet Take 1 tablet (10 mg total) by mouth at bedtime.  . memantine (NAMENDA XR) 28 MG CP24 24 hr capsule Take 28 mg by mouth daily.  . metoprolol succinate (TOPROL-XL) 50 MG 24 hr tablet Take 1 tablet (50 mg total) by mouth daily. Take with or immediately following a meal.  . Nutritional Supplements (NUTRITIONAL SUPPLEMENT PO) HSG Regular Diet - HSG Mech Soft Texture, regular consistency  . risperiDONE (RISPERDAL) 0.25 MG tablet Take 0.25 mg by mouth at bedtime.    No facility-administered encounter  medications on file as of 07/26/2017.      SIGNIFICANT DIAGNOSTIC EXAMS   02-18-16: carotid ultrasound: Bilateral: intimal wall thickening CCA. Mild soft plaque origin ICA. 1-39% ICA plaquing. Vertebral artery flow is antegrade.  02-12-17: chest x-ray: Cardiomegaly and mild interstitial edema. No focal airspace consolidation.  02-12-17:  ct of head and cervical spine: 1. Chronic small vessel ischemic disease. No acute intracranial abnormality. 2. Small chronic caudate lacunar infarct on the right. 3. Cerebral atrophy. 4. Calcified frontal meningioma.  02-13-17: thyroid ultrasound: Multinodular goiter. Two dominant nodules occupying a large portion of the left thyroid lobe. Both of these nodules meet criteria for biopsy. Recommend clinical correlation with regards to the need for tissue sampling based on patient's age and comorbidities.   02-14-17: ct angio of  chest: 1. No evidence of pulmonary embolus. 2. Mild bibasilar atelectasis or scarring noted. Mild interstitial prominence seen. Mild scarring at the lung apices. 3. Cardiomegaly.  Diffuse coronary artery calcifications seen. 4. Small pericardial effusion. 5. Large left thyroid mass, measuring perhaps 4.4 cm in size. This may be increased in size from 2017. Recommend further evaluation with thyroid ultrasound. If patient is clinically hyperthyroid, consider nuclear medicine thyroid uptake and scan. 6. Scattered aortic atherosclerosis.  02-14-17: TEE: - Left ventricle: Global longitudinal strai is -20.2%. The cavity size was normal. Wall thickness was increased in a pattern of mild LVH. Systolic function was normal. The estimated ejection fraction was in the range of 60% to 65%. Doppler parameters are consistent with abnormal left ventricular relaxation (grade 1  diastolic dysfunction). - Mitral valve: There was mild to moderate regurgitation. - Right atrium: The atrium was mildly dilated. - Tricuspid valve: There was mild-moderate regurgitation. - Pulmonary arteries: PA peak pressure: 40 mm Hg (S). - Pericardium, extracardiac: A trivial pericardial effusion was  identified.   02-15-17 ct head and cervical spine: 1. No acute intracranial abnormality. No skull fracture. Stable atrophy, chronic and remote ischemia. 2. Chronic and degenerative change in the cervical spine without evidence of acute fracture  02-15-17: left shoulder x-ray: Apparent mildly displaced fracture of the left acromion on a single view. Would correlate for associated symptoms.  02-15-17: left elbow: No evidence of fracture or dislocation.  02-15-17: pelvic x-ray: No evidence of fracture or dislocation.  02-28-17: left shoulder x-ray: unremarkable prosthesis. Evidence of old trauma    03-07-17: right shoulder ct scan: 1. No acute injury of the right shoulder. 2. Right shoulder arthroplasty without hardware failure or  complication  03-07-17: left shoulder ct scan: 1. Displaced fracture of the base of the acromion extending into the remainder of the acromion process with comminution. No significant callus formation. 2. Left shoulder arthroplasty without hardware failure or complication.  NO NEW EXAMS     LABS REVIEWED: PREVIOUS LABS    10-17-16; wbc 5.2; hgb 14.0; hct 44.3; mcv 89.1; plt 217; glucose 126; bun 21.6; creat 1.03; k+ 4.0; na++ 145; liver normal albumin 4.1; chol 193; ldl 116; trig 75; hdl 62; hgb a1c 6.3  12-06-16: wbc 7.1; hgb 13.8; hct 43.2 mcv 89.9; plt 219; glucose 123; bun 27.1; creat 1.23; k+ 4.5 ;na++ 144 ca 9.8  02-12-17: wbc 7.3; hgb 11.6; hct 36.7; mcv 88.7; plt 157; glucose 108; bun 21; creat 1.45; k+ 3.6; na+ 143; ca 9.0; liver normal albumin 3.3; d-dimer 0.50; mag 2.0 02-13-17: glucose 101; bun 17; creat 1.22; k+ 3.7; na++ 143; ca 9.0; tsh 1.807 02-14-17: wbc 5.9; hgb 12.4; hct 38.5; mcv 87.7; plt 165; glucose 99;  bun 15; creat 1.01; k+ 3.7; na++ 143; ca 8.8  03-13-17: wbc 6.5; hgb 12.3; hct 36.9; mcv 86.8; plt 182; glucose 93; bun 34; creat 1.68; k+ 4.0; na++ 143; ca 9.0    NO NEW LABS    Review of Systems  Unable to perform ROS: Dementia (unable to fully participate )    Physical Exam  Constitutional: No distress.  Thin   Neck: Neck supple. No thyromegaly present.  Cardiovascular: Normal rate, regular rhythm and intact distal pulses.  Murmur heard. 1/6  Pulmonary/Chest: Effort normal and breath sounds normal. No respiratory distress.  Abdominal: Soft. Bowel sounds are normal. She exhibits no distension. There is no tenderness.  Musculoskeletal:  Has neck contracture Is able to move all extremities    Lymphadenopathy:    She has no cervical adenopathy.  Neurological: She is alert.  Skin: Skin is warm and dry. She is not diaphoretic.  Psychiatric:  Is depressed        ASSESSMENT/ PLAN:   TODAY    1. Thyroid nodule: her tsh is normal at 1.807: stable  due to her  advanced age and advanced dementia: I have spoken with Dr. Montez Morita; will not proceed with any further workup as she will not be able to participate in the workup and will not provider her any significant benefit. Will continue to monitor her status.  2. COPD: is stable is currently not on medications will continue to monitor her status.   3. Vascular dementia: she is without change in her status; her weight is 136 pounds: will continue aricept 10 mg nightly and namenda xr 28 mg daily will monitor   4. CKD stage III: is stable bun 15; creat 1.01 will monitor   5. Depression with anxiety: worse: will continue buspar 7.5 mg in the AM and 15 mg nightly. Will begin her on celexa 10 mg daily. Will monitor   PREVIOUS   6. schizophrenia: no change in her status she is currently without obvious hallucinations; will continue risperdal  0.25 mg nightly    7. Chronic pulmonary embolism: her respiratory status is stable will continue eliquis 2.5 mg twice daily  8. Chronic arterial  ischemic stroke: is neurology stable will continue eliquis 2.5 mg twice daily   9. Hypertension: b/p 120/80 is stable will continue toprol xl 50 mg daily   Will check cmp    MD is aware of resident's narcotic use and is in agreement with current plan of care. We will attempt to wean resident as apropriate   Synthia Innocent NP [facility]  Contact [phone] Monday through Friday 8am- 5pm  After hours call [phone]

## 2017-07-27 DIAGNOSIS — I10 Essential (primary) hypertension: Secondary | ICD-10-CM | POA: Diagnosis not present

## 2017-07-27 LAB — BASIC METABOLIC PANEL
BUN: 21 (ref 4–21)
Creatinine: 1.1 (ref 0.5–1.1)
Glucose: 85
Potassium: 4.6 (ref 3.4–5.3)
Sodium: 149 — AB (ref 137–147)

## 2017-07-27 LAB — HEPATIC FUNCTION PANEL
ALT: 10 (ref 7–35)
AST: 19 (ref 13–35)
Alkaline Phosphatase: 84 (ref 25–125)
Bilirubin, Total: 0.2

## 2017-08-12 DIAGNOSIS — R413 Other amnesia: Secondary | ICD-10-CM | POA: Diagnosis not present

## 2017-09-23 ENCOUNTER — Emergency Department (HOSPITAL_COMMUNITY)
Admission: EM | Admit: 2017-09-23 | Discharge: 2017-09-23 | Disposition: A | Discharge status: Home / Self Care | Payer: Medicare Other | Attending: Emergency Medicine | Admitting: Emergency Medicine

## 2017-09-23 ENCOUNTER — Emergency Department (HOSPITAL_COMMUNITY): Payer: Medicare Other

## 2017-09-23 ENCOUNTER — Encounter: Payer: Self-pay | Admitting: Internal Medicine

## 2017-09-23 ENCOUNTER — Non-Acute Institutional Stay (SKILLED_NURSING_FACILITY): Payer: Medicare Other | Admitting: Internal Medicine

## 2017-09-23 DIAGNOSIS — I130 Hypertensive heart and chronic kidney disease with heart failure and stage 1 through stage 4 chronic kidney disease, or unspecified chronic kidney disease: Secondary | ICD-10-CM | POA: Diagnosis not present

## 2017-09-23 DIAGNOSIS — F0280 Dementia in other diseases classified elsewhere without behavioral disturbance: Secondary | ICD-10-CM | POA: Diagnosis not present

## 2017-09-23 DIAGNOSIS — W19XXXA Unspecified fall, initial encounter: Secondary | ICD-10-CM | POA: Insufficient documentation

## 2017-09-23 DIAGNOSIS — I2782 Chronic pulmonary embolism: Secondary | ICD-10-CM | POA: Diagnosis not present

## 2017-09-23 DIAGNOSIS — Y999 Unspecified external cause status: Secondary | ICD-10-CM | POA: Insufficient documentation

## 2017-09-23 DIAGNOSIS — J449 Chronic obstructive pulmonary disease, unspecified: Secondary | ICD-10-CM | POA: Insufficient documentation

## 2017-09-23 DIAGNOSIS — Z8673 Personal history of transient ischemic attack (TIA), and cerebral infarction without residual deficits: Secondary | ICD-10-CM | POA: Diagnosis not present

## 2017-09-23 DIAGNOSIS — S0990XA Unspecified injury of head, initial encounter: Secondary | ICD-10-CM | POA: Diagnosis not present

## 2017-09-23 DIAGNOSIS — I10 Essential (primary) hypertension: Secondary | ICD-10-CM

## 2017-09-23 DIAGNOSIS — F0390 Unspecified dementia without behavioral disturbance: Secondary | ICD-10-CM | POA: Insufficient documentation

## 2017-09-23 DIAGNOSIS — Y92129 Unspecified place in nursing home as the place of occurrence of the external cause: Secondary | ICD-10-CM | POA: Diagnosis not present

## 2017-09-23 DIAGNOSIS — I5032 Chronic diastolic (congestive) heart failure: Secondary | ICD-10-CM | POA: Insufficient documentation

## 2017-09-23 DIAGNOSIS — G309 Alzheimer's disease, unspecified: Secondary | ICD-10-CM | POA: Diagnosis not present

## 2017-09-23 DIAGNOSIS — Z79899 Other long term (current) drug therapy: Secondary | ICD-10-CM | POA: Insufficient documentation

## 2017-09-23 DIAGNOSIS — Y939 Activity, unspecified: Secondary | ICD-10-CM | POA: Diagnosis not present

## 2017-09-23 DIAGNOSIS — S0101XA Laceration without foreign body of scalp, initial encounter: Secondary | ICD-10-CM

## 2017-09-23 DIAGNOSIS — R4182 Altered mental status, unspecified: Secondary | ICD-10-CM | POA: Diagnosis not present

## 2017-09-23 DIAGNOSIS — Z7901 Long term (current) use of anticoagulants: Secondary | ICD-10-CM

## 2017-09-23 DIAGNOSIS — N183 Chronic kidney disease, stage 3 (moderate): Secondary | ICD-10-CM | POA: Insufficient documentation

## 2017-09-23 DIAGNOSIS — R410 Disorientation, unspecified: Secondary | ICD-10-CM | POA: Diagnosis not present

## 2017-09-23 MED ORDER — LIDOCAINE HCL (PF) 2 % IJ SOLN
INTRAMUSCULAR | Status: AC
  Filled 2017-09-23: qty 2

## 2017-09-23 NOTE — ED Notes (Signed)
[facility] and gave report on pt condition. Informed her that she will be returning.

## 2017-09-23 NOTE — ED Provider Notes (Signed)
[facility]   CSN: [id number] Arrival date & time: 09/23/17  1315     History   Chief Complaint Chief Complaint  Patient presents with  . Fall  . Head Laceration    HPI Roberta Griffin is a 84 y.o. female.  The history is provided by the patient. No language interpreter was used.  Fall  This is a new problem. The current episode started 1 to 2 hours ago. The problem occurs constantly. The problem has not changed since onset.Pertinent negatives include no chest pain and no abdominal pain. Nothing aggravates the symptoms. Nothing relieves the symptoms. She has tried nothing for the symptoms. The treatment provided no relief.  Head Laceration  Pertinent negatives include no chest pain and no abdominal pain.   Pt fell and hit her head.  Past Medical History:  Diagnosis Date  . COPD (chronic obstructive pulmonary disease) (HCC)   . Dementia   . Depression   . Dysphagia   . Hypertension   . Scoliosis     Patient Active Problem List   Diagnosis Date Noted  . Depression with anxiety 07/26/2017  . High risk medication use 06/03/2017  . Chronic pain of both shoulders 06/03/2017  . Schizophrenia (HCC) 04/26/2017  . GAD (generalized anxiety disorder) 04/05/2017  . History of stroke 12/05/2016  . Chronic pulmonary embolism (HCC) 12/05/2016  . Chronic diastolic CHF (congestive heart failure) (HCC) 03/04/2016  . Insomnia 03/04/2016  . Dysarthria 03/04/2016  . Thyroid nodule 03/04/2016  . Pancreatic mass   . HLD (hyperlipidemia)   . Dysphagia 02/18/2016  . CVA (cerebral vascular accident) (HCC) 02/17/2016  . COPD (chronic obstructive pulmonary disease) (HCC) 02/17/2016  . Dementia 02/17/2016  . Essential hypertension 02/17/2016  . Normocytic anemia 02/17/2016  . CKD (chronic kidney disease), stage III (HCC) 02/17/2016    Past Surgical History:  Procedure Laterality Date  . ABDOMINAL HYSTERECTOMY    . KNEE SURGERY      . SHOULDER SURGERY    . VITRECTOMY AND CATARACT      OB History    No data available       Home Medications    Prior to Admission medications   Medication Sig Start Date End Date Taking? Authorizing Provider  apixaban (ELIQUIS) 2.5 MG TABS tablet Take 2.5 mg by mouth 2 (two) times daily.    [provider]  busPIRone (BUSPAR) 15 MG tablet Give 7.5 mg by mouth one time a day and 15 mg by mouth at bedtime    [provider]  donepezil (ARICEPT) 10 MG tablet Take 1 tablet (10 mg total) by mouth at bedtime. 02/09/16   Mesner, Barbara CowerJason, MD  memantine (NAMENDA XR) 28 MG CP24 24 hr capsule Take 28 mg by mouth daily.    [provider]  metoprolol succinate (TOPROL-XL) 50 MG 24 hr tablet Take 1 tablet (50 mg total) by mouth daily. Take with or immediately following a meal. 02/09/16   Mesner, Barbara CowerJason, MD  Nutritional Supplements (NUTRITIONAL SUPPLEMENT PO) HSG Regular Diet - HSG Mech Soft Texture, regular consistency    [provider]  risperiDONE (RISPERDAL) 0.25 MG tablet Take 0.25 mg by mouth at bedtime.     [provider]    Family History Family History  Problem Relation Age of Onset  . Stroke Neg Hx     Social History Social History   Tobacco Use  . Smoking status: Never Smoker  . Smokeless tobacco: Never Used  Substance Use Topics  . Alcohol use: No  . Drug use: No     Allergies   Tramadol   Review of Systems Review of Systems  Cardiovascular: Negative for chest pain.  Gastrointestinal: Negative for abdominal pain.  All other systems reviewed and are negative.    Physical Exam Updated Vital Signs BP (!) 193/81 (BP Location: Right Arm)   Pulse (!) 47   Temp 97.6 F (36.4 C) (Oral)   Resp 15   SpO2 97%   Physical Exam  Constitutional: She appears well-developed and well-nourished.  HENT:  Head: Normocephalic.  Right Ear: External ear normal.  3cm laceration scalp,  Gapping   Eyes: Conjunctivae and EOM are  normal. Pupils are equal, round, and reactive to light.  Neck: Normal range of motion.  Cardiovascular: Normal rate.  Pulmonary/Chest: Effort normal and breath sounds normal.  Abdominal: Soft.  Musculoskeletal: Normal range of motion.  Neurological: She is alert.  Confused   Skin: Skin is warm.  Psychiatric: She has a normal mood and affect.  Nursing note and vitals reviewed.    ED Treatments / Results  Labs (all labs ordered are listed, but only abnormal results are displayed) Labs Reviewed - No data to display  EKG  EKG Interpretation None       Radiology Ct Head Wo Contrast  Result Date: 09/23/2017 CLINICAL DATA:  Head injury after fall today. EXAM: CT HEAD WITHOUT CONTRAST CT CERVICAL SPINE WITHOUT CONTRAST TECHNIQUE: Multidetector CT imaging of the head and cervical spine was performed following the standard protocol without intravenous contrast. Multiplanar CT image reconstructions of the cervical spine were also generated. COMPARISON:  CT scan of February 15, 2017. FINDINGS: CT HEAD FINDINGS Brain: Mild diffuse cortical atrophy is noted. Mild chronic ischemic white matter disease is noted. No mass effect or midline shift is noted. Ventricular size is within normal limits. There is no evidence of mass lesion, hemorrhage or acute infarction. Vascular: No hyperdense vessel or unexpected calcification. Skull: Normal. Negative for fracture or focal lesion. Sinuses/Orbits: No acute finding. Other: None. CT CERVICAL SPINE FINDINGS Alignment: Moderate dextroscoliosis of cervical spine is noted. Skull base and vertebrae: No acute fracture. No primary bone lesion or focal pathologic process. Soft tissues and spinal canal: No prevertebral fluid or swelling. No visible canal hematoma. Disc levels: There appears to be fusion of the C3-4 and C4-5 disc spaces secondary to degenerative change. Mild degenerative disc disease is noted at C5-6. Severe degenerative disc disease is noted at C7-T1. Upper  chest: Probable large left-sided thyroid goiter is noted. Other: Fusion of posterior facet joints is also noted in the upper cervical spine secondary to degenerative change. IMPRESSION: Mild diffuse cortical atrophy. Mild chronic ischemic white matter disease. No acute intracranial abnormality seen. Extensive degenerative changes are noted in the cervical spine. No acute fracture or spondylolisthesis is noted. Electronically Signed   By: Lupita Raider, M.D.   On: 09/23/2017 14:30   Ct Cervical Spine Wo Contrast  Result Date: 09/23/2017 CLINICAL DATA:  Head injury after fall today. EXAM: CT HEAD WITHOUT CONTRAST CT CERVICAL SPINE WITHOUT CONTRAST TECHNIQUE: Multidetector CT imaging of the head and cervical spine was performed following the standard protocol without intravenous contrast. Multiplanar CT image reconstructions of the cervical spine were also generated. COMPARISON:  CT scan of February 15, 2017. FINDINGS: CT HEAD FINDINGS Brain: Mild diffuse cortical atrophy is noted. Mild chronic ischemic white matter disease is noted. No mass effect or midline shift is noted. Ventricular size  is within normal limits. There is no evidence of mass lesion, hemorrhage or acute infarction. Vascular: No hyperdense vessel or unexpected calcification. Skull: Normal. Negative for fracture or focal lesion. Sinuses/Orbits: No acute finding. Other: None. CT CERVICAL SPINE FINDINGS Alignment: Moderate dextroscoliosis of cervical spine is noted. Skull base and vertebrae: No acute fracture. No primary bone lesion or focal pathologic process. Soft tissues and spinal canal: No prevertebral fluid or swelling. No visible canal hematoma. Disc levels: There appears to be fusion of the C3-4 and C4-5 disc spaces secondary to degenerative change. Mild degenerative disc disease is noted at C5-6. Severe degenerative disc disease is noted at C7-T1. Upper chest: Probable large left-sided thyroid goiter is noted. Other: Fusion of posterior facet  joints is also noted in the upper cervical spine secondary to degenerative change. IMPRESSION: Mild diffuse cortical atrophy. Mild chronic ischemic white matter disease. No acute intracranial abnormality seen. Extensive degenerative changes are noted in the cervical spine. No acute fracture or spondylolisthesis is noted. Electronically Signed   By: Lupita Raider, M.D.   On: 09/23/2017 14:30    Procedures .Marland KitchenLaceration Repair Date/Time: 09/23/2017 3:07 PM Performed by: Elson Areas, PA-C Authorized by: Elson Areas, PA-C   Consent:    Consent obtained:  Verbal   Consent given by:  Patient Anesthesia (see MAR for exact dosages):    Anesthesia method:  Local infiltration Laceration details:    Location:  Scalp   Scalp location:  Frontal   Length (cm):  3   Depth (mm):  3 Repair type:    Repair type:  Simple Pre-procedure details:    Preparation:  Patient was prepped and draped in usual sterile fashion Exploration:    Contaminated: no   Treatment:    Area cleansed with:  Betadine   Irrigation solution:  Sterile saline   Visualized foreign bodies/material removed: no   Skin repair:    Repair method:  Staples   Number of staples:  6 Approximation:    Approximation:  Loose Post-procedure details:    Patient tolerance of procedure:  Tolerated well, no immediate complications   (including critical care time)  Medications Ordered in ED Medications  lidocaine (XYLOCAINE) 2 % injection (not administered)     Initial Impression / Assessment and Plan / ED Course  I have reviewed the triage vital signs and the nursing notes.  Pertinent labs & imaging results that were available during my care of the patient were reviewed by me and considered in my medical decision making (see chart for details).       Final Clinical Impressions(s) / ED Diagnoses   Final diagnoses:  Laceration of scalp, initial encounter    ED Discharge Orders    None    An After Visit Summary was  printed and given to the patient.    Osie Cheeks 09/23/17 1510    Benjiman Core, MD 09/23/17 [phone]

## 2017-09-23 NOTE — ED Triage Notes (Signed)
To ED for eval after falling. Pt to ED via [facility]. From [facility]. Dementia.

## 2017-09-23 NOTE — Discharge Instructions (Signed)
Return if any problems.

## 2017-09-23 NOTE — Progress Notes (Signed)
Patient ID: Roberta Griffin, female   DOB: 02-08-1926, 84 y.o.   MRN: [id number]  Location:  [place] of Service:  SNF (31) Provider:  DR Roberta Gwendalyn Ege, DO  Patient Care Team: Roberta Cranker, DO as PCP - General (Internal Medicine) Center, [place] [facility])  Extended Emergency Contact Information Primary Emergency Contact: Roberta Griffin of [place] Phone: [phone] Relation: Daughter Secondary Emergency Contact: Roberta Griffin of [place] Phone: [phone] Relation: Grandaughter  Code Status:  DNR Goals of care: Advanced Directive information Advanced Directives 07/11/2017  Does Patient Have a Medical Advance Directive? Yes  Type of Advance Directive Out of facility DNR (pink MOST or yellow form)  Does patient want to make changes to medical advance directive? No - Patient declined  Copy of [place] in Chart? -  Would patient like information on creating a medical advance directive? -  Pre-existing out of facility DNR order (yellow form or pink MOST form) Pink MOST form placed in chart (order not valid for inpatient use);Yellow form placed in chart (order not valid for inpatient use)     Chief Complaint  Patient presents with  . Acute Visit    fall from bed with scalp laceration    HPI:  Pt is a 84 y.o. female [place] resident seen today for an acute visit for fall OOB striking her head. She now has a large deep laceration to right forehead and scalp that is actively bleeding. Fall unwitnessed. Bed was at lowest position and she has a fall mat. She is on eliquis for PE and hx CVA. She c/o pain in her head. She is a poor historian due to dementia. Hx obtained from chart.   Vascular dementia - stable on aricept 10 mg nightly and namenda xr 28 mg daily  Depression with anxiety - mod stable on buspar 7.5 mg in the AM and 15 mg nightly; celexa  10 mg daily. Will monitor   Schizophrenia - stable on risperdal  0.25 mg nightly    Chronic pulmonary embolism - stable on eliquis 2.5 mg twice daily  hx arterial  ischemic stroke  - stable on eliquis 2.5 mg twice daily   Hypertension - BP elevated likely 2/2 fall; usually stable on toprol xl 50 mg daily   Past Medical History:  Diagnosis Date  . COPD (chronic obstructive pulmonary disease) ([place])   . Dementia   . Depression   . Dysphagia   . Hypertension   . Scoliosis    Past Surgical History:  Procedure Laterality Date  . ABDOMINAL HYSTERECTOMY    . KNEE SURGERY    . SHOULDER SURGERY    . VITRECTOMY AND CATARACT      Allergies  Allergen Reactions  . Tramadol Other (See Comments)    twitching    Outpatient Encounter Medications as of 09/23/2017  Medication Sig  . apixaban (ELIQUIS) 2.5 MG TABS tablet Take 2.5 mg by mouth 2 (two) times daily.  . busPIRone (BUSPAR) 15 MG tablet Give 7.5 mg by mouth one time a day and 15 mg by mouth at bedtime  . donepezil (ARICEPT) 10 MG tablet Take 1 tablet (10 mg total) by mouth at bedtime.  . memantine (NAMENDA XR) 28 MG CP24 24 hr capsule Take 28 mg by mouth daily.  . metoprolol succinate (TOPROL-XL) 50 MG 24 hr tablet Take 1 tablet (50 mg total) by mouth daily. Take with or immediately  following a meal.  . Nutritional Supplements (NUTRITIONAL SUPPLEMENT PO) HSG Regular Diet - HSG Mech Soft Texture, regular consistency  . risperiDONE (RISPERDAL) 0.25 MG tablet Take 0.25 mg by mouth at bedtime.    No facility-administered encounter medications on file as of 09/23/2017.     Review of Systems  Unable to perform ROS: Dementia    Immunization History  Administered Date(s) Administered  . Influenza-Unspecified 05/20/2016, 06/10/2017  . PPD Test 02/24/2016, 05/04/2016, 05/11/2016   Pertinent  Health Maintenance Due  Topic Date Due  . PNA vac Low Risk Adult (1 of 2 - PCV13) 06/20/2040 (Originally 01/14/1993)  . INFLUENZA VACCINE   Completed  . DEXA SCAN  Discontinued   Fall Risk  02/18/2017  Falls in the past year? Yes  Number falls in past yr: 1  Injury with Fall? Yes  Comment broken L collarbone   Functional Status Survey:    Vitals:   09/23/17 1225  BP: (!) 188/89  Pulse: (!) 52  Temp: 98.6 F (37 C)  SpO2: 95%  Weight: 133 lb 12.8 oz (60.7 kg)   Body mass index is 25.28 kg/m. Physical Exam  Constitutional: She appears well-developed.  Frail appearing in NAD sitting on floor then moved to bed  HENT:  Head: Head is with laceration.    Mouth/Throat: Oropharynx is clear and moist. No oropharyngeal exudate.  MMM; no oral thrush  Eyes: EOM are normal. Pupils are equal, round, and reactive to light. Right eye exhibits no discharge. Left eye exhibits no discharge. No scleral icterus.  Right upper lid is droopy  Neck: Neck supple. Carotid bruit is not present. No tracheal deviation present.  Cardiovascular: Regular rhythm and intact distal pulses. Bradycardia present. Exam reveals no gallop and no friction rub.  Murmur heard.  Systolic murmur is present with a grade of 1/6. Trace LE edema b/l; no calf TTP  Pulmonary/Chest: Effort normal and breath sounds normal. No stridor. No respiratory distress. She has no wheezes. She has no rales.  Abdominal: Soft. Normal appearance and bowel sounds are normal. She exhibits no distension and no mass. There is no hepatomegaly. There is no tenderness. There is no rigidity, no rebound and no guarding. No hernia.  Musculoskeletal: She exhibits edema.  Lymphadenopathy:    She has no cervical adenopathy.  Neurological: She is alert.  Skin: Skin is warm and dry. No rash noted.  Psychiatric: She has a normal mood and affect. Her behavior is normal.    Labs reviewed: Recent Labs    02/12/17 2258 02/13/17 0505 02/14/17 0337 03/13/17 1312 03/13/17 1403 07/27/17  NA 143 143 142 144 143 149*  K 3.6 3.7 3.7 4.1 4.0 4.6  CL 111 110 113* 105 109  --   CO2 _0 --   26  --   GLUCOSE 108* 101* 99 86 93  --   BUN 21* 17 15 32* 34* 21  CREATININE 1.45* 1.22* 1.01* 1.70* 1.68* 1.1  CALCIUM 9.0 9.0 8.8*  --  9.0  --   MG 2.0  --   --   --   --   --    Recent Labs    02/12/17 2258 07/27/17  AST 21 19  ALT 11* 10  ALKPHOS 66 84  BILITOT 0.3  --   PROT 6.2*  --   ALBUMIN 3.3*  --    Recent Labs    02/12/17 2258 02/14/17 0337 03/13/17 1312 03/13/17 1403  WBC 7.3 5.9  --  6.5  NEUTROABS 5.4 3.7  --  3.7  HGB 11.6* 12.4 13.3 12.3  HCT 36.7 38.5 39.0 36.9  MCV 88.9 87.7  --  86.8  PLT 157 165  --  182   Lab Results  Component Value Date   TSH 1.807 02/13/2017   Lab Results  Component Value Date   HGBA1C 5.6 02/18/2016   Lab Results  Component Value Date   CHOL 214 (H) 02/18/2016   HDL 54 02/18/2016   LDLCALC 142 (H) 02/18/2016   TRIG 90 02/18/2016   CHOLHDL 4.0 02/18/2016    Significant Diagnostic Results in last 30 days:  No results found.  Assessment/Plan   ICD-10-CM   1. Laceration of scalp, initial encounter S01.01XA   2. Fall, initial encounter W19.XXXA   3. Chronic anticoagulation Z79.01   4. Other chronic pulmonary embolism without acute cor pulmonale (HCC) I27.82   5. Essential hypertension I10   6. Alzheimer's dementia without behavioral disturbance, unspecified timing of dementia onset G30.9    F02.80    Laceration appears to be deep and is still bleeding - met with [facility] NP. Discussed acute nature of laceration. Pt to be sent to the ED for further eval (will need to r/o acute cerebral process as she takes chronic anticoagualtion).   Family/ staff Communication: [facility] NP to call family. Staff notified pt to be transported to the ED for further tests and laceration repair  Labs/tests ordered: None   Roberta S. Perlie Gold  [facility] [address] [place], [place] [address] [phone] Cell (Monday-Friday 8 AM - 5 PM) [phone] After 5 PM and follow  prompts

## 2017-09-25 DIAGNOSIS — D649 Anemia, unspecified: Secondary | ICD-10-CM | POA: Diagnosis not present

## 2017-09-25 DIAGNOSIS — J449 Chronic obstructive pulmonary disease, unspecified: Secondary | ICD-10-CM | POA: Diagnosis not present

## 2017-09-25 LAB — HEPATIC FUNCTION PANEL
ALT: 9 (ref 7–35)
AST: 26 (ref 13–35)
Alkaline Phosphatase: 89 (ref 25–125)
Bilirubin, Total: 0.3

## 2017-09-25 LAB — CBC AND DIFFERENTIAL
HCT: 43 (ref 36–46)
HCT: 43 (ref 36–46)
Hemoglobin: 13.7 (ref 12.0–16.0)
Hemoglobin: 13.7 (ref 12.0–16.0)
Neutrophils Absolute: 4
Neutrophils Absolute: 4
Platelets: 189 (ref 150–399)
Platelets: 189 (ref 150–399)
WBC: 5.5
WBC: 5.5

## 2017-09-25 LAB — BASIC METABOLIC PANEL
BUN: 20 (ref 4–21)
Creatinine: 1.3 — AB (ref 0.5–1.1)
Glucose: 160
Potassium: 4.1 (ref 3.4–5.3)
Sodium: 147 (ref 137–147)

## 2017-09-26 ENCOUNTER — Non-Acute Institutional Stay (SKILLED_NURSING_FACILITY): Payer: Medicare Other | Admitting: Internal Medicine

## 2017-09-26 ENCOUNTER — Encounter: Payer: Self-pay | Admitting: Internal Medicine

## 2017-09-26 DIAGNOSIS — F209 Schizophrenia, unspecified: Secondary | ICD-10-CM | POA: Diagnosis not present

## 2017-09-26 DIAGNOSIS — Z79899 Other long term (current) drug therapy: Secondary | ICD-10-CM

## 2017-09-26 DIAGNOSIS — S0101XD Laceration without foreign body of scalp, subsequent encounter: Secondary | ICD-10-CM | POA: Diagnosis not present

## 2017-09-26 DIAGNOSIS — G309 Alzheimer's disease, unspecified: Secondary | ICD-10-CM | POA: Diagnosis not present

## 2017-09-26 DIAGNOSIS — I10 Essential (primary) hypertension: Secondary | ICD-10-CM

## 2017-09-26 DIAGNOSIS — F0280 Dementia in other diseases classified elsewhere without behavioral disturbance: Secondary | ICD-10-CM

## 2017-09-26 NOTE — Progress Notes (Signed)
Location:  [facility] Room Number: 214B Place of Service:  SNF [phone]) Provider:  DR MONICA Ivan Croft, DO  Patient Care Team: Kirt Boys, DO as PCP - General (Internal Medicine) Center, [facility]  Extended Emergency Contact Information Primary Emergency Contact: [name] [place] of [place] Home Phone: [phone] Relation: Daughter Secondary Emergency Contact: [name],[name]  [place] Mobile Phone: [phone] Relation: Grandaughter  Code Status:  DNR Goals of care: Advanced Directive information Advanced Directives 09/23/2017  Does Patient Have a Medical Advance Directive? No  Type of Advance Directive -  Does patient want to make changes to medical advance directive? -  Copy of Healthcare Power of Attorney in Chart? -  Would patient like information on creating a medical advance directive? Yes (ED - Information included in AVS)  Pre-existing out of facility DNR order (yellow form or pink MOST form) -     Chief Complaint  Patient presents with  . Medical Management of Chronic Issues    Medical Management of Chronic Issues. [facility]     HPI:  Pt is a 84 y.o. Griffin seen today for medical management of chronic diseases.  She has no c/o - denies HA or dizziness. She fell and sustained head laceration. She has x 5 sutures right scalp. She is a poor historian due to dementia. Hx obtained from chart.  Vascular dementia - unchanged on aricept 10 mg nightly and namenda xr 28 mg daily  Depression with anxiety - stable on buspar 7.5 mg in the AM and 15 mg nightly; celexa 10 mg daily  Schizophrenia - psychosis stable on risperdal  0.25 mg nightly    Chronic pulmonary embolism - stable on eliquis 2.5 mg twice daily. No signs of bleeding  hx arterial ischemic stroke  - stable on eliquis 2.5 mg twice daily. No signs of bleeding   Hypertension - BP stable on toprol  xl 50 mg daily  Past Medical History:  Diagnosis Date  . COPD (chronic obstructive pulmonary disease) (HCC)   . Dementia   . Depression   . Dysphagia   . Hypertension   . Scoliosis    Past Surgical History:  Procedure Laterality Date  . ABDOMINAL HYSTERECTOMY    . KNEE SURGERY    . SHOULDER SURGERY    . VITRECTOMY AND CATARACT      Allergies  Allergen Reactions  . Tramadol Other (See Comments)    twitching    Outpatient Encounter Medications as of 09/26/2017  Medication Sig  . apixaban (ELIQUIS) 2.5 MG TABS tablet Take 2.5 mg by mouth 2 (two) times daily.  . busPIRone (BUSPAR) 15 MG tablet Give 7.5 mg by mouth one time a day and 15 mg by mouth at bedtime  . donepezil (ARICEPT) 10 MG tablet Take 1 tablet (10 mg total) by mouth at bedtime.  . memantine (NAMENDA XR) 28 MG CP24 24 hr capsule Take 28 mg by mouth daily.  . metoprolol succinate (TOPROL-XL) 50 MG 24 hr tablet Take 1 tablet (50 mg total) by mouth daily. Take with or immediately following a meal.  . Nutritional Supplements (NUTRITIONAL SUPPLEMENT PO) HSG Regular Diet - HSG Mech Soft Texture, regular consistency  . risperiDONE (RISPERDAL) 0.25 MG tablet Take 0.25 mg by mouth at bedtime.   . Vitamin D, Ergocalciferol, (DRISDOL) 50000 units CAPS capsule Take 50,000 Units by mouth every 7 (seven) days.  . [DISCONTINUED] citalopram (CELEXA) 10 MG tablet Take  10 mg by mouth daily.   No facility-administered encounter medications on file as of 09/26/2017.     Review of Systems  Unable to perform ROS: Dementia    Immunization History  Administered Date(s) Administered  . Influenza-Unspecified 05/20/2016, 06/10/2017  . PPD Test 02/24/2016, 05/04/2016, 05/11/2016   Pertinent  Health Maintenance Due  Topic Date Due  . PNA vac Low Risk Adult (1 of 2 - PCV13) 06/20/2040 (Originally 01/14/1993)  . INFLUENZA VACCINE  Completed  . DEXA SCAN  Discontinued   Fall Risk  02/18/2017  Falls in the past year? Yes  Number falls in  past yr: 1  Injury with Fall? Yes  Comment broken L collarbone   Functional Status Survey:    Vitals:   09/26/17 1339  BP: 130/80  Pulse: 70  Resp: 18  Temp: 98.4 F (36.9 C)  TempSrc: Oral  SpO2: 98%  Weight: 133 lb 12.8 oz (60.7 kg)  Height: 5\' 1"  (1.549 m)   Body mass index is 25.28 kg/m. Physical Exam  Constitutional: She appears well-developed.  Frail appearing in NAD  HENT:  Head: Normocephalic.  Mouth/Throat: Oropharynx is clear and moist. No oropharyngeal exudate.  Right scalp with x5 sutures intact; no secondary signs of infection  Eyes: Pupils are equal, round, and reactive to light. No scleral icterus.  Neck: Neck supple. Carotid bruit is not present. No tracheal deviation present. No thyromegaly present.  Cardiovascular: Normal rate, regular rhythm and intact distal pulses. Exam reveals no gallop and no friction rub.  Murmur (1/6 SEM) heard. No LE edema b/l. no calf TTP.   Pulmonary/Chest: Effort normal and breath sounds normal. No stridor. No respiratory distress. She has no wheezes. She has no rales.  Abdominal: Soft. Normal appearance and bowel sounds are normal. She exhibits no distension and no mass. There is no hepatomegaly. There is no tenderness. There is no rigidity, no rebound and no guarding. No hernia.  Musculoskeletal: She exhibits edema (small joints).  Lymphadenopathy:    She has no cervical adenopathy.  Neurological: She is alert.  Skin: Skin is warm and dry. No rash noted.  Psychiatric: She has a normal mood and affect. Her behavior is normal.    Labs reviewed: Recent Labs    02/12/17 2258 02/13/17 0505 02/14/17 0337 03/13/17 1312 03/13/17 1403 07/27/17  NA 143 143 142 144 143 149*  K 3.6 3.7 3.7 4.1 4.0 4.6  CL 111 110 113* 105 109  --   CO2 26 25 23   --  26  --   GLUCOSE 108* 101* 99 86 93  --   BUN 21* 17 15 32* 34* 21  CREATININE 1.45* 1.22* 1.01* 1.70* 1.68* 1.1  CALCIUM 9.0 9.0 8.8*  --  9.0  --   MG 2.0  --   --   --   --    --    Recent Labs    02/12/17 2258 07/27/17  AST 21 19  ALT 11* 10  ALKPHOS 66 84  BILITOT 0.3  --   PROT 6.2*  --   ALBUMIN 3.3*  --    Recent Labs    02/12/17 2258 02/14/17 0337 03/13/17 1312 03/13/17 1403  WBC 7.3 5.9  --  6.5  NEUTROABS 5.4 3.7  --  3.7  HGB 11.6* 12.4 13.3 12.3  HCT 36.7 38.5 39.0 36.9  MCV 88.9 87.7  --  86.8  PLT 157 165  --  182   Lab Results  Component Value Date  TSH 1.807 02/13/2017   Lab Results  Component Value Date   HGBA1C 5.6 02/18/2016   Lab Results  Component Value Date   CHOL 214 (H) 02/18/2016   HDL 54 02/18/2016   LDLCALC 142 (H) 02/18/2016   TRIG 90 02/18/2016   CHOLHDL 4.0 02/18/2016    Significant Diagnostic Results in last 30 days:  Ct Head Wo Contrast  Result Date: 09/23/2017 CLINICAL DATA:  Head injury after fall today. EXAM: CT HEAD WITHOUT CONTRAST CT CERVICAL SPINE WITHOUT CONTRAST TECHNIQUE: Multidetector CT imaging of the head and cervical spine was performed following the standard protocol without intravenous contrast. Multiplanar CT image reconstructions of the cervical spine were also generated. COMPARISON:  CT scan of February 15, 2017. FINDINGS: CT HEAD FINDINGS Brain: Mild diffuse cortical atrophy is noted. Mild chronic ischemic white matter disease is noted. No mass effect or midline shift is noted. Ventricular size is within normal limits. There is no evidence of mass lesion, hemorrhage or acute infarction. Vascular: No hyperdense vessel or unexpected calcification. Skull: Normal. Negative for fracture or focal lesion. Sinuses/Orbits: No acute finding. Other: None. CT CERVICAL SPINE FINDINGS Alignment: Moderate dextroscoliosis of cervical spine is noted. Skull base and vertebrae: No acute fracture. No primary bone lesion or focal pathologic process. Soft tissues and spinal canal: No prevertebral fluid or swelling. No visible canal hematoma. Disc levels: There appears to be fusion of the C3-4 and C4-5 disc spaces  secondary to degenerative change. Mild degenerative disc disease is noted at C5-6. Severe degenerative disc disease is noted at C7-T1. Upper chest: Probable large left-sided thyroid goiter is noted. Other: Fusion of posterior facet joints is also noted in the upper cervical spine secondary to degenerative change. IMPRESSION: Mild diffuse cortical atrophy. Mild chronic ischemic white matter disease. No acute intracranial abnormality seen. Extensive degenerative changes are noted in the cervical spine. No acute fracture or spondylolisthesis is noted. Electronically Signed   By: Lupita Raider, M.D.   On: 09/23/2017 14:30   Ct Cervical Spine Wo Contrast  Result Date: 09/23/2017 CLINICAL DATA:  Head injury after fall today. EXAM: CT HEAD WITHOUT CONTRAST CT CERVICAL SPINE WITHOUT CONTRAST TECHNIQUE: Multidetector CT imaging of the head and cervical spine was performed following the standard protocol without intravenous contrast. Multiplanar CT image reconstructions of the cervical spine were also generated. COMPARISON:  CT scan of February 15, 2017. FINDINGS: CT HEAD FINDINGS Brain: Mild diffuse cortical atrophy is noted. Mild chronic ischemic white matter disease is noted. No mass effect or midline shift is noted. Ventricular size is within normal limits. There is no evidence of mass lesion, hemorrhage or acute infarction. Vascular: No hyperdense vessel or unexpected calcification. Skull: Normal. Negative for fracture or focal lesion. Sinuses/Orbits: No acute finding. Other: None. CT CERVICAL SPINE FINDINGS Alignment: Moderate dextroscoliosis of cervical spine is noted. Skull base and vertebrae: No acute fracture. No primary bone lesion or focal pathologic process. Soft tissues and spinal canal: No prevertebral fluid or swelling. No visible canal hematoma. Disc levels: There appears to be fusion of the C3-4 and C4-5 disc spaces secondary to degenerative change. Mild degenerative disc disease is noted at C5-6. Severe  degenerative disc disease is noted at C7-T1. Upper chest: Probable large left-sided thyroid goiter is noted. Other: Fusion of posterior facet joints is also noted in the upper cervical spine secondary to degenerative change. IMPRESSION: Mild diffuse cortical atrophy. Mild chronic ischemic white matter disease. No acute intracranial abnormality seen. Extensive degenerative changes are noted in the cervical  spine. No acute fracture or spondylolisthesis is noted. Electronically Signed   By: Lupita Raider, M.D.   On: 09/23/2017 14:30    Assessment/Plan   ICD-10-CM   1. Laceration of scalp, subsequent encounter S01.01XD   2. Alzheimer's dementia without behavioral disturbance, unspecified timing of dementia onset G30.9    F02.80   3. Schizophrenia, unspecified type (HCC) F20.9   4. Essential hypertension I10   5. High risk medication use Z79.899    Cont current meds as ordered  PT/OT/ST as indicated  Psych services to follow  [facility] NP to follow  Will follow  Labs/tests ordered: bmp, cbc   Monica S. Ancil Linsey  [facility] [address] [place], [place] [address] [phone] Cell (Monday-Friday 8 AM - 5 PM) [phone] After 5 PM and follow prompts

## 2017-10-28 ENCOUNTER — Other Ambulatory Visit: Payer: Self-pay

## 2017-10-28 ENCOUNTER — Emergency Department (HOSPITAL_COMMUNITY)
Admission: EM | Admit: 2017-10-28 | Discharge: 2017-10-29 | Payer: Medicare Other | Attending: Emergency Medicine | Admitting: Emergency Medicine

## 2017-10-28 ENCOUNTER — Encounter (HOSPITAL_COMMUNITY): Payer: Self-pay

## 2017-10-28 DIAGNOSIS — F0390 Unspecified dementia without behavioral disturbance: Secondary | ICD-10-CM | POA: Insufficient documentation

## 2017-10-28 DIAGNOSIS — I130 Hypertensive heart and chronic kidney disease with heart failure and stage 1 through stage 4 chronic kidney disease, or unspecified chronic kidney disease: Secondary | ICD-10-CM | POA: Insufficient documentation

## 2017-10-28 DIAGNOSIS — Z79899 Other long term (current) drug therapy: Secondary | ICD-10-CM | POA: Insufficient documentation

## 2017-10-28 DIAGNOSIS — J449 Chronic obstructive pulmonary disease, unspecified: Secondary | ICD-10-CM | POA: Insufficient documentation

## 2017-10-28 DIAGNOSIS — N183 Chronic kidney disease, stage 3 (moderate): Secondary | ICD-10-CM | POA: Diagnosis not present

## 2017-10-28 DIAGNOSIS — I5032 Chronic diastolic (congestive) heart failure: Secondary | ICD-10-CM | POA: Diagnosis not present

## 2017-10-28 DIAGNOSIS — R569 Unspecified convulsions: Secondary | ICD-10-CM | POA: Diagnosis not present

## 2017-10-28 DIAGNOSIS — Z7901 Long term (current) use of anticoagulants: Secondary | ICD-10-CM | POA: Insufficient documentation

## 2017-10-28 LAB — CBC
HCT: 42.9 % (ref 36.0–46.0)
Hemoglobin: 14.3 g/dL (ref 12.0–15.0)
MCH: 29.7 pg (ref 26.0–34.0)
MCHC: 33.3 g/dL (ref 30.0–36.0)
MCV: 89 fL (ref 78.0–100.0)
Platelets: 197 10*3/uL (ref 150–400)
RBC: 4.82 MIL/uL (ref 3.87–5.11)
RDW: 13.6 % (ref 11.5–15.5)
WBC: 13.8 10*3/uL — ABNORMAL HIGH (ref 4.0–10.5)

## 2017-10-28 LAB — BASIC METABOLIC PANEL
Anion gap: 11 (ref 5–15)
BUN: 18 mg/dL (ref 6–20)
CO2: 22 mmol/L (ref 22–32)
Calcium: 8.5 mg/dL — ABNORMAL LOW (ref 8.9–10.3)
Chloride: 108 mmol/L (ref 101–111)
Creatinine, Ser: 1.49 mg/dL — ABNORMAL HIGH (ref 0.44–1.00)
GFR calc Af Amer: 35 mL/min — ABNORMAL LOW (ref >60)
GFR calc non Af Amer: 30 mL/min — ABNORMAL LOW (ref >60)
Glucose, Bld: 140 mg/dL — ABNORMAL HIGH (ref 65–99)
Potassium: 3.7 mmol/L (ref 3.5–5.1)
Sodium: 141 mmol/L (ref 135–145)

## 2017-10-28 NOTE — ED Notes (Signed)
ED Provider at bedside. 

## 2017-10-28 NOTE — ED Triage Notes (Signed)
Per [facility], pt from [facility] nursing facility where staff reports they witnessed a 2 minute full body jerking seizure. EMS reports baseline GCS of 13 and had hx of dementia and schizophrenia. Pt has no hx of seizures. Pt is confused at baseline. Pt reports pain, but can't verbalize where.

## 2017-10-28 NOTE — ED Provider Notes (Signed)
[facility] EMERGENCY DEPARTMENT Provider Note   CSN: [id number] Arrival date & time: 10/28/17  2251     History   Chief Complaint Chief Complaint  Patient presents with  . Seizures    HPI Roberta Griffin is a 84 y.o. female.  HPI  This is an 84 year old female with a history of dementia, COPD, hypertension who presents with reported seizure activity.  Per EMS, patient was noted to have a 2-minute episode of general body shaking.  She was back at her baseline following the episode.  There is no additional collateral information available.  I attempted to call patient's nursing home without success.  She is demented and disoriented.  She is unable to contribute to history taking.  Level 5 caveat for acuity of condition.  Past Medical History:  Diagnosis Date  . COPD (chronic obstructive pulmonary disease) (HCC)   . Dementia   . Depression   . Dysphagia   . Hypertension   . Scoliosis     Patient Active Problem List   Diagnosis Date Noted  . Depression with anxiety 07/26/2017  . High risk medication use 06/03/2017  . Chronic pain of both shoulders 06/03/2017  . Schizophrenia (HCC) 04/26/2017  . GAD (generalized anxiety disorder) 04/05/2017  . History of stroke 12/05/2016  . Chronic pulmonary embolism (HCC) 12/05/2016  . Chronic diastolic CHF (congestive heart failure) (HCC) 03/04/2016  . Insomnia 03/04/2016  . Dysarthria 03/04/2016  . Thyroid nodule 03/04/2016  . Pancreatic mass   . HLD (hyperlipidemia)   . Dysphagia 02/18/2016  . CVA (cerebral vascular accident) (HCC) 02/17/2016  . COPD (chronic obstructive pulmonary disease) (HCC) 02/17/2016  . Dementia 02/17/2016  . Essential hypertension 02/17/2016  . Normocytic anemia 02/17/2016  . CKD (chronic kidney disease), stage III (HCC) 02/17/2016    Past Surgical History:  Procedure Laterality Date  . ABDOMINAL HYSTERECTOMY    . KNEE SURGERY    . SHOULDER SURGERY    . VITRECTOMY AND CATARACT       OB History    No data available       Home Medications    Prior to Admission medications   Medication Sig Start Date End Date Taking? Authorizing Provider  apixaban (ELIQUIS) 2.5 MG TABS tablet Take 2.5 mg by mouth 2 (two) times daily.   Yes [provider]  busPIRone (BUSPAR) 15 MG tablet Take 7.5-15 mg by mouth 2 (two) times daily. Give 7.5 mg by mouth one time a day and 15 mg by mouth at bedtime   Yes [provider]  citalopram (CELEXA) 10 MG tablet Take 10 mg by mouth daily.   Yes [provider]  donepezil (ARICEPT) 10 MG tablet Take 1 tablet (10 mg total) by mouth at bedtime. 02/09/16  Yes Mesner, Barbara Cower, MD  memantine (NAMENDA XR) 14 MG CP24 24 hr capsule Take 14 mg by mouth daily.   Yes [provider]  metoprolol succinate (TOPROL-XL) 50 MG 24 hr tablet Take 1 tablet (50 mg total) by mouth daily. Take with or immediately following a meal. 02/09/16  Yes Mesner, Barbara Cower, MD  risperiDONE (RISPERDAL) 0.25 MG tablet Take 0.25 mg by mouth at bedtime.    Yes [provider]  Vitamin D, Ergocalciferol, (DRISDOL) 50000 units CAPS capsule Take 50,000 Units by mouth every 30 (thirty) days.    Yes [provider]    Family History Family History  Problem Relation Age of Onset  . Stroke Neg Hx     Social  History Social History   Tobacco Use  . Smoking status: Never Smoker  . Smokeless tobacco: Never Used  Substance Use Topics  . Alcohol use: No  . Drug use: No     Allergies   Tramadol   Review of Systems Review of Systems  Unable to perform ROS: Dementia     Physical Exam Updated Vital Signs BP 130/72   Pulse 86   Temp (!) 97.4 F (36.3 C) (Rectal)   Resp (!) 23   Ht 5\' 1"  (1.549 m)   Wt 54.4 kg (120 lb)   SpO2 99%   BMI 22.67 kg/m   Physical Exam  Constitutional:  Elderly, nontoxic, no acute distress  HENT:  Head: Normocephalic and atraumatic.  Eyes: Pupils are equal, round, and reactive to light.    Drainage noted from the right eye  Cardiovascular: Normal rate, regular rhythm and normal heart sounds.  No murmur heard. Pulmonary/Chest: Effort normal and breath sounds normal. No respiratory distress. She has no wheezes.  Abdominal: Soft. There is no tenderness.  Neurological: She is alert.  Disoriented, intermittently follows commands, appears to use all 4 extremities equally  Skin: Skin is warm and dry.  Nursing note and vitals reviewed.    ED Treatments / Results  Labs (all labs ordered are listed, but only abnormal results are displayed) Labs Reviewed  BASIC METABOLIC PANEL - Abnormal; Notable for the following components:      Result Value   Glucose, Bld 140 (*)    Creatinine, Ser 1.49 (*)    Calcium 8.5 (*)    GFR calc non Af Amer 30 (*)    GFR calc Af Amer 35 (*)    All other components within normal limits  CBC - Abnormal; Notable for the following components:   WBC 13.8 (*)    All other components within normal limits  URINALYSIS, ROUTINE W REFLEX MICROSCOPIC - Abnormal; Notable for the following components:   APPearance HAZY (*)    Hgb urine dipstick SMALL (*)    Ketones, ur 5 (*)    Protein, ur 30 (*)    Squamous Epithelial / LPF 0-5 (*)    All other components within normal limits  CBG MONITORING, ED    EKG  EKG Interpretation  Date/Time:  Monday October 28 2017 22:59:15 EDT Ventricular Rate:  64 PR Interval:    QRS Duration: 155 QT Interval:  493 QTC Calculation: 509 R Axis:   -131 Text Interpretation:  Sinus rhythm Right bundle branch block No significant change since last tracing Confirmed by Ross MarcusHorton, Courtney ([id number]) on 10/28/2017 11:05:40 PM       Radiology Ct Head Wo Contrast  Result Date: 10/29/2017 CLINICAL DATA:  Seizure, new, nontraumatic, >40 yrs EXAM: CT HEAD WITHOUT CONTRAST TECHNIQUE: Contiguous axial images were obtained from the base of the skull through the vertex without intravenous contrast. COMPARISON:  Head CT 09/23/2017  FINDINGS: Brain: No intracranial hemorrhage. No evidence of acute ischemia. Generalized atrophy is unchanged from prior exam. Moderate chronic small vessel ischemia. Punctate remote lacunar infarct in the right caudate, unchanged. Minimal encephalomalacia in the posterior right frontal lobe, unchanged. Partially calcified right frontal meningioma versus prominent hyperostosis is unchanged from prior exam. No subdural or extra-axial fluid collection. No hydrocephalus, mass effect, or midline shift. Chronic basal gangliar calcifications. Vascular: Atherosclerosis of skullbase vasculature without hyperdense vessel or abnormal calcification. Skull: Prominent right frontal hyperostosis versus partially calcified meningioma, unchanged. No fracture. Sinuses/Orbits: Trace mucosal thickening in left side of sphenoid  sinus, chronic. The visualized orbits are unremarkable. Bilateral cataract resection. Other: None. IMPRESSION: 1.  No acute intracranial abnormality. 2. Unchanged atrophy and chronic small vessel ischemia. Unchanged remote lacunar infarct in the right caudate. Unchanged remote infarct in the posterior right MCA distribution. Electronically Signed   By: Rubye Oaks M.D.   On: 10/29/2017 01:13   Dg Chest Portable 1 View  Result Date: 10/29/2017 CLINICAL DATA:  Seizure for 2 minutes EXAM: PORTABLE CHEST 1 VIEW COMPARISON:  02/12/2017 FINDINGS: Stable cardiomegaly with aortic atherosclerosis. No acute pulmonary consolidation or CHF. No effusion. Bilateral shoulder arthroplasties are noted, partially included on right. No acute fracture nor malalignment identified. IMPRESSION: Stable cardiomegaly with aortic atherosclerosis. No active pulmonary disease. No acute osseous appearing abnormality. Electronically Signed   By: Tollie Eth M.D.   On: 10/29/2017 01:36    Procedures Procedures (including critical care time)  Medications Ordered in ED Medications - No data to display   Initial Impression /  Assessment and Plan / ED Course  I have reviewed the triage vital signs and the nursing notes.  Pertinent labs & imaging results that were available during my care of the patient were reviewed by me and considered in my medical decision making (see chart for details).     Patient presents with reported seizure-like activity.  Unable unfortunately to obtain any collateral information.  Phone numbers for facility do not appear to be working.  She is reportedly at her baseline.  She is demented and does not contribute to history taking.  Vital signs are reassuring.  No evidence of fever.  She does not appear focal.  She intermittently follows commands and moves all 4 extremities equally.  Lab work obtained.  No evidence of UTI.  She has leukocytosis to 13.8.  For this reason, urine and chest x-ray were obtained.  These are reassuring.  No signs or symptoms of sepsis.  CT head obtained without any mass lesion.  On multiple re-evaluations she is resting comfortably.  She does not appear encephalopathic.  Would not pursue further work-up at this time.  She is DNR.  Will discharge back to facility.  After history, exam, and medical workup I feel the patient has been appropriately medically screened and is safe for discharge home. Pertinent diagnoses were discussed with the patient. Patient was given return precautions.  Final Clinical Impressions(s) / ED Diagnoses   Final diagnoses:  Seizure-like activity [facility])    ED Discharge Orders    None       Wilkie Aye, Mayer Masker, MD 10/29/17 [phone]

## 2017-10-29 ENCOUNTER — Emergency Department (HOSPITAL_COMMUNITY): Payer: Medicare Other

## 2017-10-29 DIAGNOSIS — R569 Unspecified convulsions: Secondary | ICD-10-CM | POA: Diagnosis not present

## 2017-10-29 LAB — URINALYSIS, ROUTINE W REFLEX MICROSCOPIC
Bacteria, UA: NONE SEEN
Bilirubin Urine: NEGATIVE
Glucose, UA: NEGATIVE mg/dL
Ketones, ur: 5 mg/dL — AB
Leukocytes, UA: NEGATIVE
Nitrite: NEGATIVE
Protein, ur: 30 mg/dL — AB
Specific Gravity, Urine: 1.02 (ref 1.005–1.030)
pH: 5 (ref 5.0–8.0)

## 2017-10-29 NOTE — ED Notes (Signed)
Xray in room at this time.

## 2017-10-29 NOTE — ED Notes (Signed)
Patient transported to CT 

## 2017-10-29 NOTE — Discharge Instructions (Signed)
Patient was evaluated today for seizure-like activity.  She is at her baseline per report.  Her work-up including head CT and lab work-up is largely reassuring.  She needs follow-up closely with her primary physician.

## 2017-10-29 NOTE — ED Notes (Signed)
This RN attempted to call [facility] again to give report with no response. PTAR took belongings.

## 2017-10-29 NOTE — ED Notes (Signed)
This RN attempted to call [facility] x2 with no answer.

## 2017-11-18 ENCOUNTER — Encounter: Payer: Self-pay | Admitting: Internal Medicine

## 2017-11-18 ENCOUNTER — Non-Acute Institutional Stay (SKILLED_NURSING_FACILITY): Payer: Medicare Other | Admitting: Internal Medicine

## 2017-11-18 DIAGNOSIS — Z8673 Personal history of transient ischemic attack (TIA), and cerebral infarction without residual deficits: Secondary | ICD-10-CM

## 2017-11-18 DIAGNOSIS — F0280 Dementia in other diseases classified elsewhere without behavioral disturbance: Secondary | ICD-10-CM

## 2017-11-18 DIAGNOSIS — F209 Schizophrenia, unspecified: Secondary | ICD-10-CM | POA: Diagnosis not present

## 2017-11-18 DIAGNOSIS — I2782 Chronic pulmonary embolism: Secondary | ICD-10-CM | POA: Diagnosis not present

## 2017-11-18 DIAGNOSIS — I10 Essential (primary) hypertension: Secondary | ICD-10-CM | POA: Diagnosis not present

## 2017-11-18 DIAGNOSIS — Z79899 Other long term (current) drug therapy: Secondary | ICD-10-CM | POA: Diagnosis not present

## 2017-11-18 DIAGNOSIS — G309 Alzheimer's disease, unspecified: Secondary | ICD-10-CM

## 2017-11-18 DIAGNOSIS — I5032 Chronic diastolic (congestive) heart failure: Secondary | ICD-10-CM | POA: Diagnosis not present

## 2017-11-18 DIAGNOSIS — J438 Other emphysema: Secondary | ICD-10-CM | POA: Diagnosis not present

## 2017-11-18 NOTE — Progress Notes (Addendum)
Patient ID: Roberta Griffin, female   DOB: 10-24-1925, 84 y.o.   MRN: [id number]  Location:  [facility] Room Number: 214 B Place of Service:  SNF [phone]) Provider:  DR MONICA Ivan Croft, DO  Patient Care Team: Kirt Boys, DO as PCP - General (Internal Medicine) Center, [facility] (Skilled [facility] Facility)  Extended Emergency Contact Information Primary Emergency Contact: [name] [place] Home Phone: [phone] Relation: Daughter Secondary Emergency Contact: [name],[name]  [place] Mobile Phone: [phone] Relation: Grandaughter  Code Status:  DNR Goals of care: Advanced Directive information Advanced Directives 11/18/2017  Does Patient Have a Medical Advance Directive? Yes  Type of Advance Directive Out of facility DNR (pink MOST or yellow form)  Does patient want to make changes to medical advance directive? No - Patient declined  Copy of Healthcare Power of Attorney in Chart? -  Would patient like information on creating a medical advance directive? -  Pre-existing out of facility DNR order (yellow form or pink MOST form) Yellow form placed in chart (order not valid for inpatient use);Pink MOST form placed in chart (order not valid for inpatient use)     Chief Complaint  Patient presents with  . Medical Management of Chronic Issues    [facility]    HPI:  Pt is a 84 y.o. female seen today for medical management of chronic diseases.  She has no concerns. No pain. No nursing issues. No f/c. She is a poor historian due to dementia. Hx obtained from chart. Eliquis held x 5 days in Feb 2019 s/p fall.  Vascular dementia - stable on aricept 10 mg nightly and namenda xr 28 mg daily. Albumin 4.1  Depression with anxiety - mod stable on buspar 7.5 mg in the AM and 15 mg nightly; celexa 10 mg daily. Will monitor   Schizophrenia - stable on risperdal  0.25 mg nightly    Chronic pulmonary embolism -  stable on eliquis 2.5 mg twice daily  hx arterial  ischemic stroke  - stable on eliquis 2.5 mg twice daily   Hypertension - BP elevated likely 2/2 fall; usually stable on toprol xl 50 mg daily  Past Medical History:  Diagnosis Date  . COPD (chronic obstructive pulmonary disease) (HCC)   . Dementia   . Depression   . Dysphagia   . Hypertension   . Scoliosis    Past Surgical History:  Procedure Laterality Date  . ABDOMINAL HYSTERECTOMY    . KNEE SURGERY    . SHOULDER SURGERY    . VITRECTOMY AND CATARACT      Allergies  Allergen Reactions  . Tramadol Other (See Comments)    twitching    Outpatient Encounter Medications as of 11/18/2017  Medication Sig  . aspirin EC 81 MG tablet Take 81 mg by mouth daily.  . busPIRone (BUSPAR) 15 MG tablet Take 7.5-15 mg by mouth 2 (two) times daily. Give 7.5 mg by mouth one time a day and 15 mg by mouth at bedtime  . citalopram (CELEXA) 10 MG tablet Take 10 mg by mouth daily.  Marland Kitchen donepezil (ARICEPT) 10 MG tablet Take 1 tablet (10 mg total) by mouth at bedtime.  . memantine (NAMENDA XR) 14 MG CP24 24 hr capsule Take 14 mg by mouth daily.  . metoprolol succinate (TOPROL-XL) 50 MG 24 hr tablet Take 1 tablet (50 mg total) by mouth daily. Take with or immediately following a meal.  . risperiDONE (RISPERDAL) 0.25 MG  tablet Take 0.25 mg by mouth at bedtime.   . Vitamin D, Ergocalciferol, (DRISDOL) 50000 units CAPS capsule Take 50,000 Units by mouth every 30 (thirty) days.   . [DISCONTINUED] apixaban (ELIQUIS) 2.5 MG TABS tablet Take 2.5 mg by mouth 2 (two) times daily.   No facility-administered encounter medications on file as of 11/18/2017.     Review of Systems  Unable to perform ROS: Dementia    Immunization History  Administered Date(s) Administered  . Influenza-Unspecified 05/20/2016, 06/10/2017  . PPD Test 02/24/2016, 05/04/2016, 05/11/2016   Pertinent  Health Maintenance Due  Topic Date Due  . PNA vac Low Risk Adult (1 of 2 - PCV13)  06/20/2040 (Originally 01/14/1993)  . INFLUENZA VACCINE  03/13/2018  . DEXA SCAN  Discontinued   Fall Risk  02/18/2017  Falls in the past year? Yes  Number falls in past yr: 1  Injury with Fall? Yes  Comment broken L collarbone   Functional Status Survey:    Vitals:   11/18/17 0921  BP: 112/80  Pulse: 66  Resp: 16  Temp: 98.4 F (36.9 C)  SpO2: 97%  Weight: 130 lb (59 kg)  Height: 5\' 1"  (1.549 m)   Body mass index is 24.56 kg/m. Physical Exam  Constitutional: She appears well-developed.  Frail appearing in NAD, sitting in w/c  HENT:  Mouth/Throat: Oropharynx is clear and moist. No oropharyngeal exudate.  MMM; no oral thrush  Eyes: Pupils are equal, round, and reactive to light. No scleral icterus.  Neck: Neck supple. Carotid bruit is not present. No tracheal deviation present. No thyromegaly present.  Cardiovascular: Normal rate, regular rhythm and intact distal pulses. Exam reveals no gallop and no friction rub.  Murmur (1/6 SEM) heard. Trace BLE edema. No calf TTP  Pulmonary/Chest: Effort normal and breath sounds normal. No stridor. No respiratory distress. She has no wheezes. She has no rales.  Abdominal: Soft. Normal appearance and bowel sounds are normal. She exhibits no distension and no mass. There is no hepatomegaly. There is no tenderness. There is no rigidity, no rebound and no guarding. No hernia.  obese  Musculoskeletal: She exhibits edema.  Lymphadenopathy:    She has no cervical adenopathy.  Neurological: She is alert.  Skin: Skin is warm and dry. No rash noted.  Psychiatric: She has a normal mood and affect. Her behavior is normal.    Labs reviewed: Recent Labs    02/12/17 2258  02/14/17 0337 03/13/17 1312 03/13/17 1403 07/27/17 09/25/17 10/28/17 2301  NA 143   < > 142 144 143 149* 147 141  K 3.6   < > 3.7 4.1 4.0 4.6 4.1 3.7  CL 111   < > 113* 105 109  --   --  108  CO2 26   < > 23  --  26  --   --  22  GLUCOSE 108*   < > 99 86 93  --   --  140*   BUN 21*   < > 15 32* 34* 21 20 18   CREATININE 1.45*   < > 1.01* 1.70* 1.68* 1.1 1.3* 1.49*  CALCIUM 9.0   < > 8.8*  --  9.0  --   --  8.5*  MG 2.0  --   --   --   --   --   --   --    < > = values in this interval not displayed.   Recent Labs    02/12/17 2258 07/27/17 09/25/17  AST  21 19 26   ALT 11* 10 9  ALKPHOS 66 84 89  BILITOT 0.3  --   --   PROT 6.2*  --   --   ALBUMIN 3.3*  --   --    Recent Labs    02/14/17 0337  03/13/17 1403 09/25/17 10/28/17 2301  WBC 5.9  --  6.5 5.5  5.5 13.8*  NEUTROABS 3.7  --  3.7 4  4   --   HGB 12.4   < > 12.3 13.7  13.7 14.3  HCT 38.5   < > 36.9 43  43 42.9  MCV 87.7  --  86.8  --  89.0  PLT 165  --  182 189  189 197   < > = values in this interval not displayed.   Lab Results  Component Value Date   TSH 1.807 02/13/2017   Lab Results  Component Value Date   HGBA1C 5.6 02/18/2016   Lab Results  Component Value Date   CHOL 214 (H) 02/18/2016   HDL 54 02/18/2016   LDLCALC 142 (H) 02/18/2016   TRIG 90 02/18/2016   CHOLHDL 4.0 02/18/2016    Significant Diagnostic Results in last 30 days:  Ct Head Wo Contrast  Result Date: 10/29/2017 CLINICAL DATA:  Seizure, new, nontraumatic, >40 yrs EXAM: CT HEAD WITHOUT CONTRAST TECHNIQUE: Contiguous axial images were obtained from the base of the skull through the vertex without intravenous contrast. COMPARISON:  Head CT 09/23/2017 FINDINGS: Brain: No intracranial hemorrhage. No evidence of acute ischemia. Generalized atrophy is unchanged from prior exam. Moderate chronic small vessel ischemia. Punctate remote lacunar infarct in the right caudate, unchanged. Minimal encephalomalacia in the posterior right frontal lobe, unchanged. Partially calcified right frontal meningioma versus prominent hyperostosis is unchanged from prior exam. No subdural or extra-axial fluid collection. No hydrocephalus, mass effect, or midline shift. Chronic basal gangliar calcifications. Vascular: Atherosclerosis of  skullbase vasculature without hyperdense vessel or abnormal calcification. Skull: Prominent right frontal hyperostosis versus partially calcified meningioma, unchanged. No fracture. Sinuses/Orbits: Trace mucosal thickening in left side of sphenoid sinus, chronic. The visualized orbits are unremarkable. Bilateral cataract resection. Other: None. IMPRESSION: 1.  No acute intracranial abnormality. 2. Unchanged atrophy and chronic small vessel ischemia. Unchanged remote lacunar infarct in the right caudate. Unchanged remote infarct in the posterior right MCA distribution. Electronically Signed   By: Rubye OaksMelanie  Ehinger M.D.   On: 10/29/2017 01:13   Dg Chest Portable 1 View  Result Date: 10/29/2017 CLINICAL DATA:  Seizure for 2 minutes EXAM: PORTABLE CHEST 1 VIEW COMPARISON:  02/12/2017 FINDINGS: Stable cardiomegaly with aortic atherosclerosis. No acute pulmonary consolidation or CHF. No effusion. Bilateral shoulder arthroplasties are noted, partially included on right. No acute fracture nor malalignment identified. IMPRESSION: Stable cardiomegaly with aortic atherosclerosis. No active pulmonary disease. No acute osseous appearing abnormality. Electronically Signed   By: Tollie Ethavid  Kwon M.D.   On: 10/29/2017 01:36    Assessment/Plan   ICD-10-CM   1. Alzheimer's dementia without behavioral disturbance, unspecified timing of dementia onset G30.9    F02.80   2. Schizophrenia, unspecified type (HCC) F20.9   3. Other emphysema (HCC) J43.8   4. Other chronic pulmonary embolism without acute cor pulmonale (HCC) I27.82   5. High risk medication use Z79.899   6. History of stroke Z86.73   7. Chronic diastolic CHF (congestive heart failure) (HCC) I50.32   8. Essential hypertension I10     Cont current meds as ordered  PT/OT/ST as indicated  Will follow  Labs/tests  ordered: none    Monica S. Ancil Linsey  [facility] [address] [place], [place]  [address] [phone] Cell (Monday-Friday 8 AM - 5 PM) [phone] After 5 PM and follow prompts

## 2018-01-13 ENCOUNTER — Encounter: Payer: Self-pay | Admitting: Internal Medicine

## 2018-01-13 ENCOUNTER — Non-Acute Institutional Stay (SKILLED_NURSING_FACILITY): Payer: Medicare Other | Admitting: Internal Medicine

## 2018-01-13 DIAGNOSIS — I2782 Chronic pulmonary embolism: Secondary | ICD-10-CM

## 2018-01-13 DIAGNOSIS — G309 Alzheimer's disease, unspecified: Secondary | ICD-10-CM

## 2018-01-13 DIAGNOSIS — F0280 Dementia in other diseases classified elsewhere without behavioral disturbance: Secondary | ICD-10-CM

## 2018-01-13 DIAGNOSIS — J438 Other emphysema: Secondary | ICD-10-CM | POA: Diagnosis not present

## 2018-01-13 DIAGNOSIS — F209 Schizophrenia, unspecified: Secondary | ICD-10-CM | POA: Diagnosis not present

## 2018-01-13 DIAGNOSIS — I10 Essential (primary) hypertension: Secondary | ICD-10-CM

## 2018-01-13 DIAGNOSIS — Z8673 Personal history of transient ischemic attack (TIA), and cerebral infarction without residual deficits: Secondary | ICD-10-CM

## 2018-01-13 NOTE — Progress Notes (Signed)
Patient ID: Roberta Griffin, female   DOB: [birth year], 84 y.o.   MRN: [id number]  Location:  [facility] Room Number: 214 B Place of Service:  SNF [phone]) Provider:  DR MONICA Ivan Croft, DO  Patient Care Team: Kirt Boys, DO as PCP - General (Internal Medicine) Center, [facility])  Extended Emergency Contact Information Primary Emergency Contact: [name] [place] Home Phone: [phone] Relation: Daughter Secondary Emergency Contact: [name],[name]  [place] Mobile Phone: [phone] Relation: Grandaughter  Code Status:  DNR Goals of care: Advanced Directive information Advanced Directives 01/13/2018  Does Patient Have a Medical Advance Directive? Yes  Type of Advance Directive Out of facility DNR (pink MOST or yellow form)  Does patient want to make changes to medical advance directive? No - Patient declined  Copy of Healthcare Power of Attorney in Chart? -  Would patient like information on creating a medical advance directive? -  Pre-existing out of facility DNR order (yellow form or pink MOST form) Yellow form placed in chart (order not valid for inpatient use);Pink MOST form placed in chart (order not valid for inpatient use)     Chief Complaint  Patient presents with  . Medical Management of Chronic Issues    [facility]    HPI:  Pt is a 84 y.o. female seen today for medical management of chronic diseases.  She fell on 12/17/17 with no injury. Fall unwitnessed. Appetite reduced. She has had increased behavioral issues. She is off buspar and aricept; namenda is being tapered off. She is followed by psych.   Vascular dementia - declining. Weight down 9 lbs. She is off aricept and namenda being tapered off by psych. Albumin 4.1  Depression with anxiety - uncontrolled. She is off buspar 7.5 mg in the AM and 15 mg nightly; takes celexa 10 mg daily  Schizophrenia -  uncontrolled. She takes risperdal  0.25 mg nightly. Off buspar    Chronic pulmonary embolism - stable on eliquis 2.5 mg twice daily  hx arterial  ischemic stroke  - stable on eliquis 2.5 mg twice daily   Hypertension - BP stable on toprol xl 50 mg daily  Past Medical History:  Diagnosis Date  . COPD (chronic obstructive pulmonary disease) (HCC)   . Dementia   . Depression   . Dysphagia   . Hypertension   . Scoliosis    Past Surgical History:  Procedure Laterality Date  . ABDOMINAL HYSTERECTOMY    . KNEE SURGERY    . SHOULDER SURGERY    . VITRECTOMY AND CATARACT      Allergies  Allergen Reactions  . Tramadol Other (See Comments)    twitching    Outpatient Encounter Medications as of 01/13/2018  Medication Sig  . acetaminophen (TYLENOL) 325 MG tablet Take 650 mg by mouth every 6 (six) hours as needed.  Marland Kitchen apixaban (ELIQUIS) 2.5 MG TABS tablet Take 2.5 mg by mouth 2 (two) times daily.  Marland Kitchen aspirin EC 81 MG tablet Take 81 mg by mouth daily.  . citalopram (CELEXA) 10 MG tablet Take 10 mg by mouth daily.  . memantine (NAMENDA XR) 14 MG CP24 24 hr capsule Take 14 mg by mouth daily.  . metoprolol succinate (TOPROL-XL) 50 MG 24 hr tablet Take 1 tablet (50 mg total) by mouth daily. Take with or immediately following a meal.  . risperiDONE (RISPERDAL) 0.25 MG tablet Take 0.25 mg by mouth at bedtime.   . Vitamin  D, Ergocalciferol, (DRISDOL) 50000 units CAPS capsule Take 50,000 Units by mouth every 30 (thirty) days.   . [DISCONTINUED] busPIRone (BUSPAR) 15 MG tablet Take 7.5-15 mg by mouth 2 (two) times daily. Give 7.5 mg by mouth one time a day and 15 mg by mouth at bedtime  . [DISCONTINUED] donepezil (ARICEPT) 10 MG tablet Take 1 tablet (10 mg total) by mouth at bedtime. (Patient not taking: Reported on 01/13/2018)   No facility-administered encounter medications on file as of 01/13/2018.     Review of Systems  Unable to perform ROS: Dementia    Immunization History  Administered  Date(s) Administered  . Influenza-Unspecified 05/20/2016, 06/10/2017  . PPD Test 02/24/2016, 05/04/2016, 05/11/2016   Pertinent  Health Maintenance Due  Topic Date Due  . PNA vac Low Risk Adult (1 of 2 - PCV13) 06/20/2040 (Originally 01/14/1993)  . INFLUENZA VACCINE  03/13/2018  . DEXA SCAN  Discontinued   Fall Risk  02/18/2017  Falls in the past year? Yes  Number falls in past yr: 1  Injury with Fall? Yes  Comment broken L collarbone   Functional Status Survey:    Vitals:   01/13/18 1121  BP: (!) 158/93  Pulse: 60  Resp: 16  Temp: 98.4 F (36.9 C)  SpO2: 97%  Weight: 121 lb (54.9 kg)  Height: 5\' 1"  (1.549 m)   Body mass index is 22.86 kg/m. Physical Exam  Constitutional: She appears well-developed.  Frail appearing in NAD, sitting in w/c  HENT:  Mouth/Throat: Oropharynx is clear and moist. No oropharyngeal exudate.  MMM; no oral thrush  Eyes: Pupils are equal, round, and reactive to light. No scleral icterus.  Neck: Neck supple. Carotid bruit is not present. No tracheal deviation present. No thyromegaly present.  Cardiovascular: Normal rate, regular rhythm and intact distal pulses. Exam reveals no gallop and no friction rub.  Murmur (1/6 SEM) heard. No LE edema b/l. no calf TTP.   Pulmonary/Chest: Effort normal and breath sounds normal. No stridor. No respiratory distress. She has no wheezes. She has no rales.  Abdominal: Soft. Normal appearance and bowel sounds are normal. She exhibits no distension and no mass. There is no hepatomegaly. There is no tenderness. There is no rigidity, no rebound and no guarding. No hernia.  obese  Musculoskeletal: She exhibits edema (small and large joint).  Lymphadenopathy:    She has no cervical adenopathy.  Neurological: She is alert. She has normal reflexes.  Skin: Skin is warm and dry. No rash noted.  Psychiatric: She has a normal mood and affect. Judgment normal. Her speech is slurred. She is agitated. Thought content is paranoid  and delusional.    Labs reviewed: Recent Labs    02/12/17 2258  02/14/17 0337 03/13/17 1312 03/13/17 1403 07/27/17 09/25/17 10/28/17 2301  NA 143   < > 142 144 143 149* 147 141  K 3.6   < > 3.7 4.1 4.0 4.6 4.1 3.7  CL 111   < > 113* 105 109  --   --  108  CO2 26   < > 23  --  26  --   --  22  GLUCOSE 108*   < > 99 86 93  --   --  140*  BUN 21*   < > 15 32* 34* 21 20 18   CREATININE 1.45*   < > 1.01* 1.70* 1.68* 1.1 1.3* 1.49*  CALCIUM 9.0   < > 8.8*  --  9.0  --   --  8.5*  MG 2.0  --   --   --   --   --   --   --    < > = values in this interval not displayed.   Recent Labs    02/12/17 2258 07/27/17 09/25/17  AST 21 19 26   ALT 11* 10 9  ALKPHOS 66 84 89  BILITOT 0.3  --   --   PROT 6.2*  --   --   ALBUMIN 3.3*  --   --    Recent Labs    02/14/17 0337  03/13/17 1403 09/25/17 10/28/17 2301  WBC 5.9  --  6.5 5.5  5.5 13.8*  NEUTROABS 3.7  --  3.7 4  4   --   HGB 12.4   < > 12.3 13.7  13.7 14.3  HCT 38.5   < > 36.9 43  43 42.9  MCV 87.7  --  86.8  --  89.0  PLT 165  --  182 189  189 197   < > = values in this interval not displayed.   Lab Results  Component Value Date   TSH 1.807 02/13/2017   Lab Results  Component Value Date   HGBA1C 5.6 02/18/2016   Lab Results  Component Value Date   CHOL 214 (H) 02/18/2016   HDL 54 02/18/2016   LDLCALC 142 (H) 02/18/2016   TRIG 90 02/18/2016   CHOLHDL 4.0 02/18/2016    Significant Diagnostic Results in last 30 days:  No results found.  Assessment/Plan   ICD-10-CM   1. Schizophrenia, unspecified type (HCC) F20.9   2. Alzheimer's dementia without behavioral disturbance, unspecified timing of dementia onset G30.9    F02.80   3. Other emphysema (HCC) J43.8   4. History of stroke Z86.73   5. Other chronic pulmonary embolism without acute cor pulmonale (HCC) I27.82   6. Essential hypertension I10     RESUME BUSPAR AND ARICEPT  Continue other medications as ordered  PT/OT/ST as ordered  Cont nutritional  supplements as ordered  Psych services to follow  [facility] NP to follow  Will follow  Labs/tests ordered: none   Monica S. Ancil Linsey  [facility] [address] [place], [place] [address] [phone] Cell (Monday-Friday 8 AM - 5 PM) [phone] After 5 PM and follow prompts

## 2018-01-15 ENCOUNTER — Encounter: Payer: Self-pay | Admitting: Internal Medicine

## 2018-01-16 LAB — HEPATIC FUNCTION PANEL
ALT: 9 (ref 7–35)
AST: 21 (ref 13–35)
Alkaline Phosphatase: 78 (ref 25–125)
Bilirubin, Total: 0.3

## 2018-01-16 LAB — CBC AND DIFFERENTIAL
HCT: 41 (ref 36–46)
Hemoglobin: 13.2 (ref 12.0–16.0)
Neutrophils Absolute: 5
Platelets: 201 (ref 150–399)
WBC: 6.9

## 2018-01-16 LAB — BASIC METABOLIC PANEL
BUN: 20 (ref 4–21)
Creatinine: 1.1 (ref 0.5–1.1)
Glucose: 86
Potassium: 4.3 (ref 3.4–5.3)
Sodium: 145 (ref 137–147)

## 2018-01-22 LAB — CBC AND DIFFERENTIAL
HCT: 42 (ref 36–46)
Hemoglobin: 13.4 (ref 12.0–16.0)
Neutrophils Absolute: 3
Platelets: 195 (ref 150–399)
WBC: 5.4

## 2018-01-22 LAB — HEPATIC FUNCTION PANEL
ALT: 10 (ref 7–35)
AST: 19 (ref 13–35)
Alkaline Phosphatase: 73 (ref 25–125)
Bilirubin, Total: 0.3

## 2018-01-22 LAB — LIPID PANEL
Cholesterol: 195 (ref 0–200)
HDL: 59 (ref 35–70)
LDL Cholesterol: 119
Triglycerides: 86 (ref 40–160)

## 2018-01-22 LAB — BASIC METABOLIC PANEL
BUN: 18 (ref 4–21)
Creatinine: 1 (ref 0.5–1.1)
Glucose: 86
Potassium: 4 (ref 3.4–5.3)
Sodium: 146 (ref 137–147)

## 2018-02-27 ENCOUNTER — Non-Acute Institutional Stay (SKILLED_NURSING_FACILITY): Payer: Medicare Other

## 2018-02-27 DIAGNOSIS — Z0000 Encounter for general adult medical examination without abnormal findings: Secondary | ICD-10-CM | POA: Diagnosis not present

## 2018-02-27 NOTE — Patient Instructions (Signed)
[name] , Thank you for taking time to come for your Medicare Wellness Visit. I appreciate your ongoing commitment to your health goals. Please review the following plan we discussed and let me know if I can assist you in the future.   Screening recommendations/referrals: Colonoscopy excluded, over age 82 Mammogram excluded, over age 82 Bone Density excluded Recommended yearly ophthalmology/optometry visit for glaucoma screening and checkup Recommended yearly dental visit for hygiene and checkup  Vaccinations: Influenza vaccine due 2019 fall season Pneumococcal vaccine 13 due, ordered Tdap vaccine due, declined by facility Shingles vaccine not in past records    Advanced directives: in chart  Conditions/risks identified: none  Next appointment: Dr. Montez Griffin makes rounds   Preventive Care 65 Years and Older, Female Preventive care refers to lifestyle choices and visits with your health care provider that can promote health and wellness. What does preventive care include?  A yearly physical exam. This is also called an annual well check.  Dental exams once or twice a year.  Routine eye exams. Ask your health care provider how often you should have your eyes checked.  Personal lifestyle choices, including:  Daily care of your teeth and gums.  Regular physical activity.  Eating a healthy diet.  Avoiding tobacco and drug use.  Limiting alcohol use.  Practicing safe sex.  Taking low-dose aspirin every day.  Taking vitamin and mineral supplements as recommended by your health care provider. What happens during an annual well check? The services and screenings done by your health care provider during your annual well check will depend on your age, overall health, lifestyle risk factors, and family history of disease. Counseling  Your health care provider may ask you questions about your:  Alcohol use.  Tobacco use.  Drug use.  Emotional well-being.  Home and  relationship well-being.  Sexual activity.  Eating habits.  History of falls.  Memory and ability to understand (cognition).  Work and work Astronomerenvironment.  Reproductive health. Screening  You may have the following tests or measurements:  Height, weight, and BMI.  Blood pressure.  Lipid and cholesterol levels. These may be checked every 5 years, or more frequently if you are over 82 years old.  Skin check.  Lung cancer screening. You may have this screening every year starting at age 82 if you have a 30-pack-year history of smoking and currently smoke or have quit within the past 15 years.  Fecal occult blood test (FOBT) of the stool. You may have this test every year starting at age 82.  Flexible sigmoidoscopy or colonoscopy. You may have a sigmoidoscopy every 5 years or a colonoscopy every 10 years starting at age 82.  Hepatitis C blood test.  Hepatitis B blood test.  Sexually transmitted disease (STD) testing.  Diabetes screening. This is done by checking your blood sugar (glucose) after you have not eaten for a while (fasting). You may have this done every 1-3 years.  Bone density scan. This is done to screen for osteoporosis. You may have this done starting at age 82.  Mammogram. This may be done every 1-2 years. Talk to your health care provider about how often you should have regular mammograms. Talk with your health care provider about your test results, treatment options, and if necessary, the need for more tests. Vaccines  Your health care provider may recommend certain vaccines, such as:  Influenza vaccine. This is recommended every year.  Tetanus, diphtheria, and acellular pertussis (Tdap, Td) vaccine. You may need a Td  booster every 10 years.  Zoster vaccine. You may need this after age 59.  Pneumococcal 13-valent conjugate (PCV13) vaccine. One dose is recommended after age 22.  Pneumococcal polysaccharide (PPSV23) vaccine. One dose is recommended after  age 35. Talk to your health care provider about which screenings and vaccines you need and how often you need them. This information is not intended to replace advice given to you by your health care provider. Make sure you discuss any questions you have with your health care provider. Document Released: 08/26/2015 Document Revised: 04/18/2016 Document Reviewed: 05/31/2015 Elsevier Interactive Patient Education  2017 [employer] Prevention in the Home Falls can cause injuries. They can happen to people of all ages. There are many things you can do to make your home safe and to help prevent falls. What can I do on the outside of my home?  Regularly fix the edges of walkways and driveways and fix any cracks.  Remove anything that might make you trip as you walk through a door, such as a raised step or threshold.  Trim any bushes or trees on the path to your home.  Use bright outdoor lighting.  Clear any walking paths of anything that might make someone trip, such as rocks or tools.  Regularly check to see if handrails are loose or broken. Make sure that both sides of any steps have handrails.  Any raised decks and porches should have guardrails on the edges.  Have any leaves, snow, or ice cleared regularly.  Use sand or salt on walking paths during winter.  Clean up any spills in your garage right away. This includes oil or grease spills. What can I do in the bathroom?  Use night lights.  Install grab bars by the toilet and in the tub and shower. Do not use towel bars as grab bars.  Use non-skid mats or decals in the tub or shower.  If you need to sit down in the shower, use a plastic, non-slip stool.  Keep the floor dry. Clean up any water that spills on the floor as soon as it happens.  Remove soap buildup in the tub or shower regularly.  Attach bath mats securely with double-sided non-slip rug tape.  Do not have throw rugs and other things on the floor that can  make you trip. What can I do in the bedroom?  Use night lights.  Make sure that you have a light by your bed that is easy to reach.  Do not use any sheets or blankets that are too big for your bed. They should not hang down onto the floor.  Have a firm chair that has side arms. You can use this for support while you get dressed.  Do not have throw rugs and other things on the floor that can make you trip. What can I do in the kitchen?  Clean up any spills right away.  Avoid walking on wet floors.  Keep items that you use a lot in easy-to-reach places.  If you need to reach something above you, use a strong step stool that has a grab bar.  Keep electrical cords out of the way.  Do not use floor polish or wax that makes floors slippery. If you must use wax, use non-skid floor wax.  Do not have throw rugs and other things on the floor that can make you trip. What can I do with my stairs?  Do not leave any items on the stairs.  Make sure that there are handrails on both sides of the stairs and use them. Fix handrails that are broken or loose. Make sure that handrails are as long as the stairways.  Check any carpeting to make sure that it is firmly attached to the stairs. Fix any carpet that is loose or worn.  Avoid having throw rugs at the top or bottom of the stairs. If you do have throw rugs, attach them to the floor with carpet tape.  Make sure that you have a light switch at the top of the stairs and the bottom of the stairs. If you do not have them, ask someone to add them for you. What else can I do to help prevent falls?  Wear shoes that:  Do not have high heels.  Have rubber bottoms.  Are comfortable and fit you well.  Are closed at the toe. Do not wear sandals.  If you use a stepladder:  Make sure that it is fully opened. Do not climb a closed stepladder.  Make sure that both sides of the stepladder are locked into place.  Ask someone to hold it for you,  if possible.  Clearly mark and make sure that you can see:  Any grab bars or handrails.  First and last steps.  Where the edge of each step is.  Use tools that help you move around (mobility aids) if they are needed. These include:  Canes.  Walkers.  Scooters.  Crutches.  Turn on the lights when you go into a dark area. Replace any light bulbs as soon as they burn out.  Set up your furniture so you have a clear path. Avoid moving your furniture around.  If any of your floors are uneven, fix them.  If there are any pets around you, be aware of where they are.  Review your medicines with your doctor. Some medicines can make you feel dizzy. This can increase your chance of falling. Ask your doctor what other things that you can do to help prevent falls. This information is not intended to replace advice given to you by your health care provider. Make sure you discuss any questions you have with your health care provider. Document Released: 05/26/2009 Document Revised: 01/05/2016 Document Reviewed: 09/03/2014 Elsevier Interactive Patient Education  2017 [employer].

## 2018-02-27 NOTE — Progress Notes (Signed)
Subjective:   Roberta Griffin is a 84 y.o. female who presents for Medicare Annual (Subsequent) preventive examination at [facility]; incapacitated patient unable to answer questions appropriately   Last AWV-02/18/2017       Objective:     Vitals: BP 138/75 (BP Location: Left Arm, Patient Position: Sitting)   Pulse 67   Temp 98.3 F (36.8 C) (Oral)   Ht 5\' 1"  (1.549 m)   Wt 121 lb (54.9 kg)   BMI 22.86 kg/m   Body mass index is 22.86 kg/m.  Advanced Directives 02/27/2018 01/13/2018 11/18/2017 09/23/2017 07/11/2017 07/10/2017 07/01/2017  Does Patient Have a Medical Advance Directive? Yes Yes Yes No Yes Yes Yes  Type of Advance Directive Out of facility DNR (pink MOST or yellow form) Out of facility DNR (pink MOST or yellow form) Out of facility DNR (pink MOST or yellow form) - Out of facility DNR (pink MOST or yellow form) Out of facility DNR (pink MOST or yellow form) Out of facility DNR (pink MOST or yellow form)  Does patient want to make changes to medical advance directive? No - Patient declined No - Patient declined No - Patient declined - No - Patient declined No - Patient declined No - Patient declined  Copy of Healthcare Power of Attorney in Chart? - - - - - - -  Would patient like information on creating a medical advance directive? - - - Yes (ED - Information included in AVS) - - -  Pre-existing out of facility DNR order (yellow form or pink MOST form) Yellow form placed in chart (order not valid for inpatient use);Pink MOST form placed in chart (order not valid for inpatient use) Yellow form placed in chart (order not valid for inpatient use);Pink MOST form placed in chart (order not valid for inpatient use) Yellow form placed in chart (order not valid for inpatient use);Pink MOST form placed in chart (order not valid for inpatient use) - Pink MOST form placed in chart (order not valid for inpatient use);Yellow form placed in chart (order not valid for inpatient  use) Pink MOST form placed in chart (order not valid for inpatient use);Yellow form placed in chart (order not valid for inpatient use) Pink MOST form placed in chart (order not valid for inpatient use);Yellow form placed in chart (order not valid for inpatient use)    Tobacco Social History   Tobacco Use  Smoking Status Never Smoker  Smokeless Tobacco Never Used     Counseling given: Not Answered   Clinical Intake:  Pre-visit preparation completed: No  Pain : No/denies pain     Nutritional Risks: None Diabetes: No  How often do you need to have someone help you when you read instructions, pamphlets, or other written materials from your doctor or pharmacy?: 4 - Often  Interpreter Needed?: No  Information entered by :: Tyron Russell, Rn  Past Medical History:  Diagnosis Date  . COPD (chronic obstructive pulmonary disease) (HCC)   . Dementia   . Depression   . Dysphagia   . Hypertension   . Scoliosis    Past Surgical History:  Procedure Laterality Date  . ABDOMINAL HYSTERECTOMY    . KNEE SURGERY    . SHOULDER SURGERY    . VITRECTOMY AND CATARACT     Family History  Problem Relation Age of Onset  . Stroke Neg Hx    Social History   Socioeconomic History  . Marital status: Single    Spouse name: Not  on file  . Number of children: Not on file  . Years of education: Not on file  . Highest education level: Not on file  Occupational History  . Not on file  Social Needs  . Financial resource strain: Not on file  . Food insecurity:    Worry: Not on file    Inability: Not on file  . Transportation needs:    Medical: Not on file    Non-medical: Not on file  Tobacco Use  . Smoking status: Never Smoker  . Smokeless tobacco: Never Used  Substance and Sexual Activity  . Alcohol use: No  . Drug use: No  . Sexual activity: Not on file  Lifestyle  . Physical activity:    Days per week: Not on file    Minutes per session: Not on file  . Stress: Not on file    Relationships  . Social connections:    Talks on phone: Not on file    Gets together: Not on file    Attends religious service: Not on file    Active member of club or organization: Not on file    Attends meetings of clubs or organizations: Not on file    Relationship status: Not on file  Other Topics Concern  . Not on file  Social History Narrative  . Not on file    Outpatient Encounter Medications as of 02/27/2018  Medication Sig  . acetaminophen (TYLENOL) 325 MG tablet Take 650 mg by mouth every 6 (six) hours as needed.  Marland Kitchen apixaban (ELIQUIS) 2.5 MG TABS tablet Take 2.5 mg by mouth 2 (two) times daily.  Marland Kitchen aspirin EC 81 MG tablet Take 81 mg by mouth daily.  . citalopram (CELEXA) 10 MG tablet Take 10 mg by mouth daily.  . memantine (NAMENDA XR) 14 MG CP24 24 hr capsule Take 14 mg by mouth daily.  . metoprolol succinate (TOPROL-XL) 50 MG 24 hr tablet Take 1 tablet (50 mg total) by mouth daily. Take with or immediately following a meal.  . risperiDONE (RISPERDAL) 0.25 MG tablet Take 0.25 mg by mouth at bedtime.   . Vitamin D, Ergocalciferol, (DRISDOL) 50000 units CAPS capsule Take 50,000 Units by mouth every 30 (thirty) days.    No facility-administered encounter medications on file as of 02/27/2018.     Activities of Daily Living In your present state of health, do you have any difficulty performing the following activities: 02/27/2018  Hearing? Y  Vision? Y  Difficulty concentrating or making decisions? Y  Walking or climbing stairs? Y  Dressing or bathing? Y  Doing errands, shopping? Y  Preparing Food and eating ? Y  Using the Toilet? Y  In the past six months, have you accidently leaked urine? Y  Do you have problems with loss of bowel control? Y  Managing your Medications? Y  Managing your Finances? Y  Housekeeping or managing your Housekeeping? Y  Some recent data might be hidden    Patient Care Team: Kirt Boys, DO as PCP - General (Internal Medicine) Center,  [facility])    Assessment:   This is a routine wellness examination for Roberta Griffin.  Exercise Activities and Dietary recommendations Current Exercise Habits: The patient does not participate in regular exercise at present, Exercise limited by: orthopedic condition(s);neurologic condition(s)  Goals    None      Fall Risk Fall Risk  02/27/2018 02/18/2017  Falls in the past year? Yes Yes  Number falls in past yr: 1  1  Injury with Fall? Yes Yes  Comment - broken L collarbone   Is the patient's home free of loose throw rugs in walkways, pet beds, electrical cords, etc?   yes      Grab bars in the bathroom? yes      Handrails on the stairs?   yes      Adequate lighting?   yes  Timed Get Up and Go performed: 25 seconds  Depression Screen PHQ 2/9 Scores 02/27/2018 02/18/2017  PHQ - 2 Score 0 0     Cognitive Function     6CIT Screen 02/27/2018 02/18/2017  What Year? 4 points 4 points  What month? 3 points 3 points  What time? 3 points 3 points  Count back from 20 0 points 0 points  Months in reverse 4 points 4 points  Repeat phrase 10 points 10 points  Total Score 24 24    Immunization History  Administered Date(s) Administered  . Influenza-Unspecified 05/20/2016, 06/10/2017  . PPD Test 02/24/2016, 05/04/2016, 05/11/2016    Qualifies for Shingles Vaccine? Not in past records  Screening Tests Health Maintenance  Topic Date Due  . TETANUS/TDAP  06/20/2040 (Originally 01/15/1945)  . PNA vac Low Risk Adult (1 of 2 - PCV13) 06/20/2040 (Originally 01/14/1993)  . INFLUENZA VACCINE  03/13/2018  . DEXA SCAN  Discontinued    Cancer Screenings: Lung: Low Dose CT Chest recommended if Age 40-80 years, 30 pack-year currently smoking OR have quit w/in 15years. Patient does not qualify. Breast:  Up to date on Mammogram? Yes   Up to date of Bone Density/Dexa? Yes, excluded Colorectal: up to date  Additional Screenings: Hepatitis C Screening: declined TDAP  due, facility declined Prevnar due-ordered    Plan:    I have personally reviewed and addressed the Medicare Annual Wellness questionnaire and have noted the following in the patient's chart:  A. Medical and social history B. Use of alcohol, tobacco or illicit drugs  C. Current medications and supplements D. Functional ability and status E.  Nutritional status F.  Physical activity G. Advance directives H. List of other physicians I.  Hospitalizations, surgeries, and ER visits in previous 12 months J.  Vitals K. Screenings to include hearing, vision, cognitive, depression L. Referrals and appointments - none  In addition, I am unable to review and discuss with incapacitated patient certain preventive protocols, quality metrics, and best practice recommendations. A written personalized care plan for preventive services as well as general preventive health recommendations were provided to patient.   See attached scanned questionnaire for additional information.   Signed,   Tyron RussellSara Saunders, RN Nurse Health Advisor  Patient Concerns: none

## 2018-03-24 ENCOUNTER — Non-Acute Institutional Stay (SKILLED_NURSING_FACILITY): Payer: Medicare Other | Admitting: Internal Medicine

## 2018-03-24 ENCOUNTER — Encounter: Payer: Self-pay | Admitting: Internal Medicine

## 2018-03-24 DIAGNOSIS — Z8673 Personal history of transient ischemic attack (TIA), and cerebral infarction without residual deficits: Secondary | ICD-10-CM | POA: Diagnosis not present

## 2018-03-24 DIAGNOSIS — F418 Other specified anxiety disorders: Secondary | ICD-10-CM

## 2018-03-24 DIAGNOSIS — F0280 Dementia in other diseases classified elsewhere without behavioral disturbance: Secondary | ICD-10-CM

## 2018-03-24 DIAGNOSIS — G309 Alzheimer's disease, unspecified: Secondary | ICD-10-CM | POA: Diagnosis not present

## 2018-03-24 DIAGNOSIS — F209 Schizophrenia, unspecified: Secondary | ICD-10-CM

## 2018-03-24 DIAGNOSIS — I10 Essential (primary) hypertension: Secondary | ICD-10-CM | POA: Diagnosis not present

## 2018-03-24 DIAGNOSIS — I2782 Chronic pulmonary embolism: Secondary | ICD-10-CM

## 2018-03-24 NOTE — Progress Notes (Signed)
Patient ID: Roberta Griffin, female   DOB: 09-20-1925, 84 y.o.   MRN: [id number]   Location:  [facility] Room Number: 204 B Place of Service:  SNF [phone]) Provider:  DR MONICA Ivan Croft, DO  Patient Care Team: Kirt Boys, DO as PCP - General (Internal Medicine) Center, [facility])  Extended Emergency Contact Information Primary Emergency Contact: [name] [place] Home Phone: [phone] Relation: Daughter Secondary Emergency Contact: [name],[name]  [place] Mobile Phone: [phone] Relation: Granddaughter  Code Status:  DNR Goals of care: Advanced Directive information Advanced Directives 03/24/2018  Does Patient Have a Medical Advance Directive? Yes  Type of Advance Directive Out of facility DNR (pink MOST or yellow form)  Does patient want to make changes to medical advance directive? No - Patient declined  Copy of Healthcare Power of Attorney in Chart? -  Would patient like information on creating a medical advance directive? -  Pre-existing out of facility DNR order (yellow form or pink MOST form) Yellow form placed in chart (order not valid for inpatient use);Pink MOST form placed in chart (order not valid for inpatient use)     Chief Complaint  Patient presents with  . Medical Management of Chronic Issues    [facility]    HPI:  Pt is a 84 y.o. female seen today for medical management of chronic diseases.  She has no c/o. No nursing issues. No recent falls. Appetite ok and sleeps well. She is a poor historian due to dementia. Hx obtained from chart.  Vascular dementia - declining. Weight down 12 lbs (total 21 lbs since Apr 2019). Weight loss is to be expected at end stage dementia. She is off aricept but continues to take namenda. Albumin 4.1. She gets nutritional supplements  Depression with anxiety - stable on buspar 15 mg BID; celexa 10 mg  daily  Schizophrenia - mood stable on risperdal  0.25 mg nightly; buspar 15mg  BID. She does benefit from this regimen  Chronic pulmonary embolism - stable on eliquis 2.5 mg twice daily  hx arterial  ischemic stroke  - stable on eliquis 2.5 mg twice daily   Hypertension - BP stable on toprol xl 50 mg daily   Past Medical History:  Diagnosis Date  . COPD (chronic obstructive pulmonary disease) (HCC)   . Dementia   . Depression   . Dysphagia   . Hypertension   . Scoliosis    Past Surgical History:  Procedure Laterality Date  . ABDOMINAL HYSTERECTOMY    . KNEE SURGERY    . SHOULDER SURGERY    . VITRECTOMY AND CATARACT      Allergies  Allergen Reactions  . Tramadol Other (See Comments)    twitching    Outpatient Encounter Medications as of 03/24/2018  Medication Sig  . acetaminophen (TYLENOL) 500 MG tablet Take 500 mg by mouth every 8 (eight) hours.   Marland Kitchen apixaban (ELIQUIS) 2.5 MG TABS tablet Take 2.5 mg by mouth 2 (two) times daily.  Marland Kitchen aspirin EC 81 MG tablet Take 81 mg by mouth daily.  . busPIRone (BUSPAR) 15 MG tablet Take 15 mg by mouth 2 (two) times daily.  . citalopram (CELEXA) 10 MG tablet Take 10 mg by mouth daily.  . memantine (NAMENDA XR) 14 MG CP24 24 hr capsule Take 14 mg by mouth daily.  . metoprolol succinate (TOPROL-XL) 50 MG 24 hr tablet Take 1 tablet (50 mg total) by mouth daily. Take  with or immediately following a meal.  . Nutritional Supplements (NUTRITIONAL SUPPLEMENT PO) Regular Diet - Mechanical soft texture.  Regular / Thin consistency  . Nutritional Supplements (NUTRITIONAL SUPPLEMENT PO) Frozen Nutritional Treat - Give by mouth three times daily after meals  . risperiDONE (RISPERDAL) 0.25 MG tablet Take 0.25 mg by mouth at bedtime.   . Vitamin D, Ergocalciferol, (DRISDOL) 50000 units CAPS capsule Take 50,000 Units by mouth every 30 (thirty) days.    No facility-administered encounter medications on file as of 03/24/2018.     Review of Systems   Unable to perform ROS: Dementia    Immunization History  Administered Date(s) Administered  . Influenza-Unspecified 05/20/2016, 06/10/2017  . PPD Test 02/24/2016, 05/04/2016, 05/11/2016   Pertinent  Health Maintenance Due  Topic Date Due  . INFLUENZA VACCINE  06/24/2018 (Originally 03/13/2018)  . PNA vac Low Risk Adult (1 of 2 - PCV13) 06/20/2040 (Originally 01/14/1993)  . DEXA SCAN  Discontinued   Fall Risk  02/27/2018 02/18/2017  Falls in the past year? Yes Yes  Number falls in past yr: 1 1  Injury with Fall? Yes Yes  Comment - broken L collarbone   Functional Status Survey:    Vitals:   03/24/18 0925  BP: 120/60  Pulse: 78  Resp: 20  Temp: 98 F (36.7 C)  SpO2: 96%  Weight: 109 lb 6.4 oz (49.6 kg)  Height: 5\' 1"  (1.549 m)   Body mass index is 20.67 kg/m. Physical Exam  Constitutional: She appears well-developed.  Frail appearing in NAD sitting in w/c  HENT:  Mouth/Throat: Oropharynx is clear and moist. No oropharyngeal exudate.  MMM; no oral thrush  Eyes: Pupils are equal, round, and reactive to light. No scleral icterus.  Neck: Neck supple. Carotid bruit is not present. No tracheal deviation present. No thyromegaly present.  Cardiovascular: Normal rate and intact distal pulses. An irregularly irregular rhythm present. Exam reveals no gallop and no friction rub.  Murmur heard.  Systolic murmur is present with a grade of 1/6. No LE edema b/l. no calf TTP.   Pulmonary/Chest: Effort normal and breath sounds normal. No stridor. No respiratory distress. She has no wheezes. She has no rales.  Abdominal: Soft. Normal appearance and bowel sounds are normal. She exhibits no distension and no mass. There is no hepatomegaly. There is no tenderness. There is no rigidity, no rebound and no guarding. No hernia.  Musculoskeletal: She exhibits edema (small joints).  Lymphadenopathy:    She has no cervical adenopathy.  Neurological: She is alert. She has normal reflexes.  Skin:  Skin is warm and dry. No rash noted.  Psychiatric: She has a normal mood and affect. Her behavior is normal. Thought content is delusional.    Labs reviewed: Recent Labs    10/28/17 2301 01/16/18 01/22/18  NA 141 145 146  K 3.7 4.3 4.0  CL 108  --   --   CO2 22  --   --   GLUCOSE 140*  --   --   BUN 18 20 18   CREATININE 1.49* 1.1 1.0  CALCIUM 8.5*  --   --    Recent Labs    09/25/17 01/16/18 01/22/18  AST 26 21 19   ALT 9 9 10   ALKPHOS 89 78 73   Recent Labs    09/25/17 10/28/17 2301 01/16/18 01/22/18  WBC 5.5  5.5 13.8* 6.9 5.4  NEUTROABS 4  4  --  5 3  HGB 13.7  13.7 14.3 13.2 13.4  HCT 43  43 42.9 41 42  MCV  --  89.0  --   --   PLT 189  189 197 201 195   Lab Results  Component Value Date   TSH 1.807 02/13/2017   Lab Results  Component Value Date   HGBA1C 5.6 02/18/2016   Lab Results  Component Value Date   CHOL 195 01/22/2018   HDL 59 01/22/2018   LDLCALC 119 01/22/2018   TRIG 86 01/22/2018   CHOLHDL 4.0 02/18/2016    Significant Diagnostic Results in last 30 days:  No results found.  Assessment/Plan   ICD-10-CM   1. Alzheimer's dementia without behavioral disturbance, unspecified timing of dementia onset G30.9    F02.80   2. Schizophrenia, unspecified type (HCC) F20.9   3. Essential hypertension I10   4. History of stroke Z86.73   5. Other chronic pulmonary embolism without acute cor pulmonale (HCC) I27.82   6. Depression with anxiety F41.8     Cont current meds as ordered  Cont nutritional supplements as ordered  PT/OT/ST as indicated  Psych services to follow  [facility] NP to follow  Will follow  Labs/tests ordered: none   Monica S. Ancil Linseyarter, D. O., F. A. C. O. I.  [facility] [address] [place], [place] [address] [phone] Cell (Monday-Friday 8 AM - 5 PM) [phone] After 5 PM and follow prompts

## 2018-03-25 ENCOUNTER — Encounter: Payer: Self-pay | Admitting: Internal Medicine

## 2018-04-02 ENCOUNTER — Encounter: Payer: Self-pay | Admitting: Internal Medicine

## 2018-04-03 LAB — TSH: TSH: 1.27 (ref 0.41–5.90)

## 2018-05-26 ENCOUNTER — Non-Acute Institutional Stay (SKILLED_NURSING_FACILITY): Payer: Medicare Other | Admitting: Internal Medicine

## 2018-05-26 ENCOUNTER — Encounter: Payer: Self-pay | Admitting: Internal Medicine

## 2018-05-26 DIAGNOSIS — F209 Schizophrenia, unspecified: Secondary | ICD-10-CM

## 2018-05-26 DIAGNOSIS — G309 Alzheimer's disease, unspecified: Secondary | ICD-10-CM

## 2018-05-26 DIAGNOSIS — I10 Essential (primary) hypertension: Secondary | ICD-10-CM

## 2018-05-26 DIAGNOSIS — I2782 Chronic pulmonary embolism: Secondary | ICD-10-CM

## 2018-05-26 DIAGNOSIS — Z8673 Personal history of transient ischemic attack (TIA), and cerebral infarction without residual deficits: Secondary | ICD-10-CM | POA: Diagnosis not present

## 2018-05-26 DIAGNOSIS — F0280 Dementia in other diseases classified elsewhere without behavioral disturbance: Secondary | ICD-10-CM

## 2018-05-26 DIAGNOSIS — Z79899 Other long term (current) drug therapy: Secondary | ICD-10-CM

## 2018-05-26 DIAGNOSIS — J438 Other emphysema: Secondary | ICD-10-CM

## 2018-05-26 NOTE — Progress Notes (Signed)
Patient ID: Roberta Griffin, female   DOB: 1925-10-18, 84 y.o.   MRN: [id number]   Location:  [facility] Room Number: 204 B Place of Service:  SNF [phone]) Provider:  DR MONICA Ivan Croft, DO  Patient Care Team: Kirt Boys, DO as PCP - General (Internal Medicine) Center, [facility]  Extended Emergency Contact Information Primary Emergency Contact: [name] [place] Home Phone: [phone] Relation: Daughter Secondary Emergency Contact: [name],[name]  [place] Mobile Phone: [phone] Relation: Granddaughter  Code Status:  DNR Goals of care: Advanced Directive information Advanced Directives 05/26/2018  Does Patient Have a Medical Advance Directive? Yes  Type of Advance Directive Out of facility DNR (pink MOST or yellow form)  Does patient want to make changes to medical advance directive? No - Patient declined  Copy of Healthcare Power of Attorney in Chart? -  Would patient like information on creating a medical advance directive? -  Pre-existing out of facility DNR order (yellow form or pink MOST form) Yellow form placed in chart (order not valid for inpatient use);Pink MOST form placed in chart (order not valid for inpatient use)     Chief Complaint  Patient presents with  . Medical Management of Chronic Issues    [facility]    HPI:  Pt is a 84 y.o. female seen today for medical management of chronic diseases.  Today she reports no concerns. She is a poor historian due to dementia. Hx obtained from chart. TSH 1.27  Vascular dementia - declining. BIMS score 4. Weight up 4 lbs since Aug 2019. However she has lost more weight than gained this year. Weight loss is to be expected at end stage dementia. She is off aricept but continues to take namenda. Albumin 4.1. She gets nutritional supplements per facility protocol.  Depression with anxiety - stable on buspar 15 mg  BID; celexa 10 mg daily. She does benefit from this regimen.  Schizophrenia - mood stable on risperdal  0.25 mg nightly; buspar 15mg  BID. She does benefit from this regimen  Chronic pulmonary embolism - stable on eliquis 2.5 mg twice daily. No bleeding  hx arterial  ischemic stroke  - stable on eliquis 2.5 mg twice daily   Hypertension - BP stable on toprol xl 50 mg daily  Past Medical History:  Diagnosis Date  . COPD (chronic obstructive pulmonary disease) (HCC)   . Dementia (HCC)   . Depression   . Dysphagia   . Hypertension   . Scoliosis    Past Surgical History:  Procedure Laterality Date  . ABDOMINAL HYSTERECTOMY    . KNEE SURGERY    . SHOULDER SURGERY    . VITRECTOMY AND CATARACT      Allergies  Allergen Reactions  . Tramadol Other (See Comments)    twitching    Outpatient Encounter Medications as of 05/26/2018  Medication Sig  . acetaminophen (TYLENOL) 500 MG tablet Take 500 mg by mouth every 8 (eight) hours.   Marland Kitchen apixaban (ELIQUIS) 2.5 MG TABS tablet Take 2.5 mg by mouth 2 (two) times daily.  . busPIRone (BUSPAR) 15 MG tablet Take 15 mg by mouth 2 (two) times daily.  . citalopram (CELEXA) 10 MG tablet Take 10 mg by mouth daily.  Marland Kitchen ENSURE (ENSURE) Take 120 mLs by mouth 3 (three) times daily between meals.  . Melatonin 3 MG TABS Take 1 tablet by mouth at bedtime.  . memantine (NAMENDA XR) 14 MG CP24 24  hr capsule Take 14 mg by mouth daily.  . metoprolol succinate (TOPROL-XL) 50 MG 24 hr tablet Take 1 tablet (50 mg total) by mouth daily. Take with or immediately following a meal.  . Nutritional Supplements (NUTRITIONAL SUPPLEMENT PO) Regular Diet - Mechanical soft texture.  Regular / Thin consistency  . Nutritional Supplements (NUTRITIONAL SUPPLEMENT PO) Frozen Nutritional Treat - Give by mouth three times daily after meals  . risperiDONE (RISPERDAL) 0.25 MG tablet Take 0.25 mg by mouth at bedtime.   . Vitamin D, Ergocalciferol, (DRISDOL) 50000 units CAPS capsule  Take 50,000 Units by mouth every 30 (thirty) days.   . [DISCONTINUED] aspirin EC 81 MG tablet Take 81 mg by mouth daily.   No facility-administered encounter medications on file as of 05/26/2018.     Review of Systems  Unable to perform ROS: Dementia (and psych d/o)    Immunization History  Administered Date(s) Administered  . Influenza-Unspecified 05/20/2016, 06/10/2017  . PPD Test 02/24/2016, 05/04/2016, 05/11/2016   Pertinent  Health Maintenance Due  Topic Date Due  . INFLUENZA VACCINE  06/24/2018 (Originally 03/13/2018)  . PNA vac Low Risk Adult (1 of 2 - PCV13) 06/20/2040 (Originally 01/14/1993)  . DEXA SCAN  Discontinued   Fall Risk  02/27/2018 02/18/2017  Falls in the past year? Yes Yes  Number falls in past yr: 1 1  Injury with Fall? Yes Yes  Comment - broken L collarbone   Functional Status Survey:    Vitals:   05/26/18 1650  BP: (!) 110/58  Pulse: (!) 56  Temp: 98.5 F (36.9 C)  SpO2: 98%  Weight: 114 lb (51.7 kg)  Height: 5\' 1"  (1.549 m)   Body mass index is 21.54 kg/m. Physical Exam  Constitutional: She appears well-developed and well-nourished.  Frail appearing in NAD, sitting in w/c  HENT:  Mouth/Throat: Oropharynx is clear and moist. No oropharyngeal exudate.  MMM; no oral thrush  Eyes: Pupils are equal, round, and reactive to light. No scleral icterus.  Neck: Neck supple. Carotid bruit is not present. No tracheal deviation present. No thyromegaly present.  Cardiovascular: Normal rate, regular rhythm and intact distal pulses. Exam reveals no gallop and no friction rub.  Murmur (1/6 SEM) heard. No LE edema b/l. no calf TTP.   Pulmonary/Chest: Effort normal and breath sounds normal. No stridor. No respiratory distress. She has no wheezes. She has no rales.  Abdominal: Soft. Normal appearance and bowel sounds are normal. She exhibits no distension and no mass. There is no hepatomegaly. There is no tenderness. There is no rigidity, no rebound and no guarding.  No hernia.  Musculoskeletal: She exhibits edema (small and large joint).  Lymphadenopathy:    She has no cervical adenopathy.  Neurological: She is alert. She has normal reflexes.  Skin: Skin is warm and dry. No rash noted.  Psychiatric: She has a normal mood and affect. Her behavior is normal. Thought content normal.    Labs reviewed: Recent Labs    10/28/17 2301 01/16/18 01/22/18  NA 141 145 146  K 3.7 4.3 4.0  CL 108  --   --   CO2 22  --   --   GLUCOSE 140*  --   --   BUN 18 20 18   CREATININE 1.49* 1.1 1.0  CALCIUM 8.5*  --   --    Recent Labs    09/25/17 01/16/18 01/22/18  AST 26 21 19   ALT 9 9 10   ALKPHOS 89 78 73   Recent Labs  09/25/17 10/28/17 2301 01/16/18 01/22/18  WBC 5.5  5.5 13.8* 6.9 5.4  NEUTROABS 4  4  --  5 3  HGB 13.7  13.7 14.3 13.2 13.4  HCT 43  43 42.9 41 42  MCV  --  89.0  --   --   PLT 189  189 197 201 195   Lab Results  Component Value Date   TSH 1.27 04/03/2018   Lab Results  Component Value Date   HGBA1C 5.6 02/18/2016   Lab Results  Component Value Date   CHOL 195 01/22/2018   HDL 59 01/22/2018   LDLCALC 119 01/22/2018   TRIG 86 01/22/2018   CHOLHDL 4.0 02/18/2016    Significant Diagnostic Results in last 30 days:  No results found.  Assessment/Plan   ICD-10-CM   1. High risk medication use Z79.899   2. Alzheimer's dementia without behavioral disturbance, unspecified timing of dementia onset (HCC) G30.9    F02.80   3. History of stroke Z86.73   4. Schizophrenia, unspecified type (HCC) F20.9   5. Essential hypertension I10   6. Other emphysema (HCC) J43.8   7. Other chronic pulmonary embolism without acute cor pulmonale (HCC) I27.82     Cont current meds as ordered  PT/OT/ST as indicated  Cont nutritional supplements as ordered  Will follow  Labs/tests ordered: cbc w diff, cmp   Monica S. Ancil Linsey  [facility] [address] [place], [place]  [address] [phone] Cell (Monday-Friday 8 AM - 5 PM) [phone] After 5 PM and follow prompts

## 2018-05-27 ENCOUNTER — Encounter: Payer: Self-pay | Admitting: Internal Medicine

## 2018-05-27 LAB — CBC AND DIFFERENTIAL
HCT: 35 — AB (ref 36–46)
Hemoglobin: 11.5 — AB (ref 12.0–16.0)
Neutrophils Absolute: 3
Platelets: 179 (ref 150–399)
WBC: 5.5

## 2018-05-27 LAB — BASIC METABOLIC PANEL
BUN: 30 — AB (ref 4–21)
Creatinine: 1.3 — AB (ref 0.5–1.1)
Glucose: 89
Potassium: 4.6 (ref 3.4–5.3)
Sodium: 145 (ref 137–147)

## 2018-05-27 LAB — HEPATIC FUNCTION PANEL
ALT: 20 (ref 7–35)
AST: 22 (ref 13–35)
Alkaline Phosphatase: 73 (ref 25–125)
Bilirubin, Total: 0.3

## 2018-07-26 ENCOUNTER — Emergency Department (HOSPITAL_COMMUNITY): Payer: Medicare Other

## 2018-07-26 ENCOUNTER — Inpatient Hospital Stay (HOSPITAL_COMMUNITY)
Admission: EM | Admit: 2018-07-26 | Discharge: 2018-07-31 | DRG: 470 | Disposition: A | Discharge status: Skilled Nursing Facility | Payer: Medicare Other | Attending: Internal Medicine | Admitting: Internal Medicine

## 2018-07-26 ENCOUNTER — Encounter (HOSPITAL_COMMUNITY): Payer: Self-pay

## 2018-07-26 ENCOUNTER — Other Ambulatory Visit: Payer: Self-pay

## 2018-07-26 DIAGNOSIS — I5032 Chronic diastolic (congestive) heart failure: Secondary | ICD-10-CM | POA: Diagnosis present

## 2018-07-26 DIAGNOSIS — Z8781 Personal history of (healed) traumatic fracture: Secondary | ICD-10-CM

## 2018-07-26 DIAGNOSIS — Z79899 Other long term (current) drug therapy: Secondary | ICD-10-CM | POA: Diagnosis not present

## 2018-07-26 DIAGNOSIS — G309 Alzheimer's disease, unspecified: Secondary | ICD-10-CM

## 2018-07-26 DIAGNOSIS — Z86711 Personal history of pulmonary embolism: Secondary | ICD-10-CM | POA: Diagnosis not present

## 2018-07-26 DIAGNOSIS — I130 Hypertensive heart and chronic kidney disease with heart failure and stage 1 through stage 4 chronic kidney disease, or unspecified chronic kidney disease: Secondary | ICD-10-CM | POA: Diagnosis present

## 2018-07-26 DIAGNOSIS — Z8673 Personal history of transient ischemic attack (TIA), and cerebral infarction without residual deficits: Secondary | ICD-10-CM

## 2018-07-26 DIAGNOSIS — J449 Chronic obstructive pulmonary disease, unspecified: Secondary | ICD-10-CM | POA: Diagnosis present

## 2018-07-26 DIAGNOSIS — Z7901 Long term (current) use of anticoagulants: Secondary | ICD-10-CM

## 2018-07-26 DIAGNOSIS — N179 Acute kidney failure, unspecified: Secondary | ICD-10-CM | POA: Diagnosis present

## 2018-07-26 DIAGNOSIS — I10 Essential (primary) hypertension: Secondary | ICD-10-CM | POA: Diagnosis present

## 2018-07-26 DIAGNOSIS — W19XXXA Unspecified fall, initial encounter: Secondary | ICD-10-CM | POA: Diagnosis present

## 2018-07-26 DIAGNOSIS — F0280 Dementia in other diseases classified elsewhere without behavioral disturbance: Secondary | ICD-10-CM | POA: Diagnosis present

## 2018-07-26 DIAGNOSIS — F329 Major depressive disorder, single episode, unspecified: Secondary | ICD-10-CM | POA: Diagnosis present

## 2018-07-26 DIAGNOSIS — Y92129 Unspecified place in nursing home as the place of occurrence of the external cause: Secondary | ICD-10-CM

## 2018-07-26 DIAGNOSIS — S72042A Displaced fracture of base of neck of left femur, initial encounter for closed fracture: Secondary | ICD-10-CM

## 2018-07-26 DIAGNOSIS — Z888 Allergy status to other drugs, medicaments and biological substances status: Secondary | ICD-10-CM | POA: Diagnosis not present

## 2018-07-26 DIAGNOSIS — F05 Delirium due to known physiological condition: Secondary | ICD-10-CM | POA: Diagnosis present

## 2018-07-26 DIAGNOSIS — S72001A Fracture of unspecified part of neck of right femur, initial encounter for closed fracture: Secondary | ICD-10-CM | POA: Diagnosis present

## 2018-07-26 DIAGNOSIS — S72009A Fracture of unspecified part of neck of unspecified femur, initial encounter for closed fracture: Secondary | ICD-10-CM

## 2018-07-26 DIAGNOSIS — S72002A Fracture of unspecified part of neck of left femur, initial encounter for closed fracture: Secondary | ICD-10-CM

## 2018-07-26 DIAGNOSIS — Z66 Do not resuscitate: Secondary | ICD-10-CM | POA: Diagnosis present

## 2018-07-26 DIAGNOSIS — S72012D Unspecified intracapsular fracture of left femur, subsequent encounter for closed fracture with routine healing: Secondary | ICD-10-CM | POA: Diagnosis not present

## 2018-07-26 DIAGNOSIS — I639 Cerebral infarction, unspecified: Secondary | ICD-10-CM | POA: Diagnosis present

## 2018-07-26 DIAGNOSIS — S72012A Unspecified intracapsular fracture of left femur, initial encounter for closed fracture: Secondary | ICD-10-CM | POA: Diagnosis present

## 2018-07-26 DIAGNOSIS — Z9889 Other specified postprocedural states: Secondary | ICD-10-CM

## 2018-07-26 DIAGNOSIS — N183 Chronic kidney disease, stage 3 unspecified: Secondary | ICD-10-CM | POA: Diagnosis present

## 2018-07-26 DIAGNOSIS — I2699 Other pulmonary embolism without acute cor pulmonale: Secondary | ICD-10-CM | POA: Diagnosis present

## 2018-07-26 LAB — CBC WITH DIFFERENTIAL/PLATELET
Abs Immature Granulocytes: 0.04 10*3/uL (ref 0.00–0.07)
Basophils Absolute: 0 10*3/uL (ref 0.0–0.1)
Basophils Relative: 0 %
Eosinophils Absolute: 0.1 10*3/uL (ref 0.0–0.5)
Eosinophils Relative: 1 %
HCT: 46.2 % — ABNORMAL HIGH (ref 36.0–46.0)
Hemoglobin: 13.7 g/dL (ref 12.0–15.0)
Immature Granulocytes: 0 %
Lymphocytes Relative: 18 %
Lymphs Abs: 1.7 10*3/uL (ref 0.7–4.0)
MCH: 28 pg (ref 26.0–34.0)
MCHC: 29.7 g/dL — ABNORMAL LOW (ref 30.0–36.0)
MCV: 94.5 fL (ref 80.0–100.0)
Monocytes Absolute: 0.8 10*3/uL (ref 0.1–1.0)
Monocytes Relative: 9 %
Neutro Abs: 6.6 10*3/uL (ref 1.7–7.7)
Neutrophils Relative %: 72 %
Platelets: 227 10*3/uL (ref 150–400)
RBC: 4.89 MIL/uL (ref 3.87–5.11)
RDW: 13.6 % (ref 11.5–15.5)
WBC: 9.2 10*3/uL (ref 4.0–10.5)
nRBC: 0 % (ref 0.0–0.2)

## 2018-07-26 LAB — TYPE AND SCREEN
ABO/RH(D): O POS
Antibody Screen: NEGATIVE

## 2018-07-26 LAB — CBC
HCT: 41.9 % (ref 36.0–46.0)
Hemoglobin: 13.3 g/dL (ref 12.0–15.0)
MCH: 28.7 pg (ref 26.0–34.0)
MCHC: 31.7 g/dL (ref 30.0–36.0)
MCV: 90.5 fL (ref 80.0–100.0)
Platelets: 200 10*3/uL (ref 150–400)
RBC: 4.63 MIL/uL (ref 3.87–5.11)
RDW: 13.4 % (ref 11.5–15.5)
WBC: 13.1 10*3/uL — ABNORMAL HIGH (ref 4.0–10.5)
nRBC: 0 % (ref 0.0–0.2)

## 2018-07-26 LAB — CREATININE, SERUM
Creatinine, Ser: 1.49 mg/dL — ABNORMAL HIGH (ref 0.44–1.00)
GFR calc Af Amer: 35 mL/min — ABNORMAL LOW (ref >60)
GFR calc non Af Amer: 31 mL/min — ABNORMAL LOW (ref >60)

## 2018-07-26 LAB — BASIC METABOLIC PANEL
Anion gap: 12 (ref 5–15)
BUN: 28 mg/dL — ABNORMAL HIGH (ref 8–23)
CO2: 24 mmol/L (ref 22–32)
Calcium: 9.4 mg/dL (ref 8.9–10.3)
Chloride: 107 mmol/L (ref 98–111)
Creatinine, Ser: 1.44 mg/dL — ABNORMAL HIGH (ref 0.44–1.00)
GFR calc Af Amer: 37 mL/min — ABNORMAL LOW (ref >60)
GFR calc non Af Amer: 32 mL/min — ABNORMAL LOW (ref >60)
Glucose, Bld: 107 mg/dL — ABNORMAL HIGH (ref 70–99)
Potassium: 4.4 mmol/L (ref 3.5–5.1)
Sodium: 143 mmol/L (ref 135–145)

## 2018-07-26 LAB — MRSA PCR SCREENING: MRSA by PCR: NEGATIVE

## 2018-07-26 LAB — PROTIME-INR
INR: 1.08
Prothrombin Time: 13.9 seconds (ref 11.4–15.2)

## 2018-07-26 LAB — ABO/RH: ABO/RH(D): O POS

## 2018-07-26 MED ORDER — MAGNESIUM HYDROXIDE 400 MG/5ML PO SUSP: 30.0000 mL | Freq: Every day | ORAL | Status: DC | PRN

## 2018-07-26 MED ORDER — RISPERIDONE 0.5 MG PO TABS
0.2500 mg | ORAL_TABLET | Freq: Every day | ORAL | Status: DC
  Administered 2018-07-26 – 2018-07-30 (×5): 0.25 mg via ORAL
  Filled 2018-07-26 (×5): qty 1

## 2018-07-26 MED ORDER — CITALOPRAM HYDROBROMIDE 10 MG PO TABS
10.0000 mg | ORAL_TABLET | Freq: Every day | ORAL | Status: DC
  Administered 2018-07-26 – 2018-07-31 (×5): 10 mg via ORAL
  Filled 2018-07-26 (×5): qty 1

## 2018-07-26 MED ORDER — VITAMIN D (ERGOCALCIFEROL) 1.25 MG (50000 UNIT) PO CAPS
50000.0000 [IU] | ORAL_CAPSULE | ORAL | Status: DC
  Administered 2018-07-26: 50000 [IU] via ORAL
  Filled 2018-07-26: qty 1

## 2018-07-26 MED ORDER — BISACODYL 5 MG PO TBEC
5.0000 mg | DELAYED_RELEASE_TABLET | Freq: Every day | ORAL | Status: DC | PRN
  Administered 2018-07-31: 5 mg via ORAL
  Filled 2018-07-26: qty 1

## 2018-07-26 MED ORDER — METOPROLOL SUCCINATE ER 50 MG PO TB24
50.0000 mg | ORAL_TABLET | Freq: Every day | ORAL | Status: DC
  Administered 2018-07-26 – 2018-07-31 (×5): 50 mg via ORAL
  Filled 2018-07-26 (×6): qty 1

## 2018-07-26 MED ORDER — SODIUM CHLORIDE 0.9% FLUSH
3.0000 mL | Freq: Two times a day (BID) | INTRAVENOUS | Status: DC
  Administered 2018-07-26 – 2018-07-31 (×7): 3 mL via INTRAVENOUS

## 2018-07-26 MED ORDER — METOPROLOL TARTRATE 5 MG/5ML IV SOLN: 5.0000 mg | Freq: Four times a day (QID) | INTRAVENOUS | Status: DC | PRN

## 2018-07-26 MED ORDER — MEMANTINE HCL ER 7 MG PO CP24
14.0000 mg | ORAL_CAPSULE | Freq: Every day | ORAL | Status: DC
  Administered 2018-07-26 – 2018-07-31 (×5): 14 mg via ORAL
  Filled 2018-07-26: qty 2
  Filled 2018-07-26: qty 1
  Filled 2018-07-26 (×6): qty 2

## 2018-07-26 MED ORDER — ACETAMINOPHEN 500 MG PO TABS
500.0000 mg | ORAL_TABLET | Freq: Three times a day (TID) | ORAL | Status: DC
  Administered 2018-07-26 – 2018-07-31 (×13): 500 mg via ORAL
  Filled 2018-07-26 (×13): qty 1

## 2018-07-26 MED ORDER — DIVALPROEX SODIUM 125 MG PO DR TAB
125.0000 mg | DELAYED_RELEASE_TABLET | Freq: Every day | ORAL | Status: DC
  Administered 2018-07-26 – 2018-07-31 (×5): 125 mg via ORAL
  Filled 2018-07-26 (×6): qty 1

## 2018-07-26 MED ORDER — HYDRALAZINE HCL 20 MG/ML IJ SOLN: 10.0000 mg | Freq: Four times a day (QID) | INTRAMUSCULAR | Status: DC | PRN

## 2018-07-26 MED ORDER — BUSPIRONE HCL 5 MG PO TABS
15.0000 mg | ORAL_TABLET | Freq: Two times a day (BID) | ORAL | Status: DC
  Administered 2018-07-26 – 2018-07-31 (×10): 15 mg via ORAL
  Filled 2018-07-26 (×10): qty 1

## 2018-07-26 MED ORDER — HEPARIN SODIUM (PORCINE) 5000 UNIT/ML IJ SOLN
5000.0000 [IU] | Freq: Three times a day (TID) | INTRAMUSCULAR | Status: DC
  Administered 2018-07-27 – 2018-07-30 (×7): 5000 [IU] via SUBCUTANEOUS
  Filled 2018-07-26 (×6): qty 1

## 2018-07-26 MED ORDER — ONDANSETRON HCL 4 MG/2ML IJ SOLN
4.0000 mg | Freq: Once | INTRAMUSCULAR | Status: AC
  Administered 2018-07-26: 4 mg via INTRAVENOUS
  Filled 2018-07-26: qty 2

## 2018-07-26 MED ORDER — HYDROCODONE-ACETAMINOPHEN 5-325 MG PO TABS
1.0000 | ORAL_TABLET | Freq: Four times a day (QID) | ORAL | Status: DC | PRN
  Administered 2018-07-26 – 2018-07-31 (×5): 1 via ORAL
  Filled 2018-07-26 (×5): qty 1

## 2018-07-26 MED ORDER — MORPHINE SULFATE (PF) 2 MG/ML IV SOLN
2.0000 mg | Freq: Once | INTRAVENOUS | Status: AC
  Administered 2018-07-26: 2 mg via INTRAVENOUS
  Filled 2018-07-26: qty 1

## 2018-07-26 NOTE — Progress Notes (Signed)
ED report received at 0745 and pt arrived to the unit via stretcher at 0815. Pt alert but confused; pt oriented to the unit unit and room; fall/safety precaution and prevention education completed; bed alarm on; safety mat placed on floors; pt incontinent with UA; pur-wick applied; ortho tech notified of pt's traction order; skin assessment completed with second RN; no pressure ulcers or opened wounds noted; pt skin clean, dry and intact. Will continue to closely monitor. Roberta Griffin. Amo Kuffour RN   07/26/18 0827  Vitals  Temp 99 F (37.2 C)  BP (!) 165/87  Pulse Rate 75  Resp 20  Oxygen Therapy  SpO2 93 %  O2 Device Room [id number]

## 2018-07-26 NOTE — ED Triage Notes (Signed)
Patient BIB [facility] from [facility] for unwitnessed fall. Hx of dementia. Currently on Eliquis. Patient only complaint is LEFT hip pain. Denies hitting head but unable to recall event. EMS gave 50mcg Fentanyl.   BP 177/80 HR 69  CBG 1118

## 2018-07-26 NOTE — Progress Notes (Signed)
TRH H&P   Patient Demographics:    Roberta Griffin, is a 84 y.o. female  MRN: [id number]   DOB - 09/24/1925  Admit Date - 07/26/2018  Outpatient Primary MD for the patient is Patient, No Pcp Per  Patient coming from: SNF  Chief Complaint  Patient presents with  . Fall      HPI:    Roberta BeringDorothy Mccaster  is a 84 y.o. female, history of PE in 2017, multinodular goiter, CKD 3 baseline creatinine around 1.2, severe underlying dementia lives in a memory care unit, history of CVA, chronic diastolic CHF with EF 60% on recent echocardiogram done in 2018.  [place] in [place] memory care unit to be close to her family in 2017 brought in after a mechanical fall after which she had left-sided leg pain.  In the ER found to have femoral shaft fracture and admitted to the hospital under my service.  Orthopedics was consulted by the ER.  As patient takes Eliquis urgent surgery could not be done.  Patient unable to provide any history due to advanced dementia.  History obtained by daughter over the phone.    Review of systems:    ROS not obtainable due to advanced dementia.  With Past History of the following :    Past Medical History:  Diagnosis Date  . COPD (chronic obstructive pulmonary disease) (HCC)   . Dementia (HCC)   . Depression   . Dysphagia   . Hypertension   . Scoliosis       Past Surgical History:  Procedure Laterality Date  . ABDOMINAL HYSTERECTOMY    . KNEE SURGERY    . SHOULDER SURGERY    . VITRECTOMY AND CATARACT        Social History:     Social History   Tobacco Use  . Smoking status: Never Smoker  . Smokeless tobacco: Never Used  Substance Use Topics  .  Alcohol use: No         Family History :     Family History  Problem Relation Age of Onset  . Stroke Neg Hx        Home Medications:   Prior to Admission medications   Medication Sig Start Date End Date Taking? Authorizing Provider  acetaminophen (TYLENOL) 500 MG tablet Take  500 mg by mouth every 8 (eight) hours.  02/19/18  Yes [provider]  apixaban (ELIQUIS) 2.5 MG TABS tablet Take 2.5 mg by mouth 2 (two) times daily.   Yes [provider]  busPIRone (BUSPAR) 15 MG tablet Take 15 mg by mouth 2 (two) times daily. 02/06/18  Yes [provider]  citalopram (CELEXA) 10 MG tablet Take 10 mg by mouth daily.   Yes [provider]  divalproex (DEPAKOTE) 125 MG DR tablet Take 125 mg by mouth daily.   Yes [provider]  ENSURE (ENSURE) Take 120 mLs by mouth 3 (three) times daily between meals. 04/21/18  Yes [provider]  Melatonin 3 MG TABS Take 3 mg by mouth at bedtime.  05/18/18  Yes [provider]  memantine (NAMENDA XR) 14 MG CP24 24 hr capsule Take 14 mg by mouth daily.   Yes [provider]  metoprolol succinate (TOPROL-XL) 50 MG 24 hr tablet Take 1 tablet (50 mg total) by mouth daily. Take with or immediately following a meal. 02/09/16  Yes Mesner, Barbara Cower, MD  Nutritional Supplements (NUTRITIONAL SUPPLEMENT PO) Frozen Nutritional Treat - Give by mouth three times daily after meals   Yes [provider]  risperiDONE (RISPERDAL) 0.25 MG tablet Take 0.25 mg by mouth at bedtime.    Yes [provider]  Vitamin D, Ergocalciferol, (DRISDOL) 50000 units CAPS capsule Take 50,000 Units by mouth every 30 (thirty) days.    Yes [provider]     Allergies:     Allergies  Allergen Reactions  . Tramadol Other (See Comments)    twitching     Physical Exam:   Vitals  Blood pressure (!) 165/87, pulse 75, temperature 99 F (37.2 C), resp. rate 20, SpO2 93 %.   1. General elderly  African-American female lying in hospital bed confused but in no distress,  2. Normal affect and insight, Not Suicidal or Homicidal, Awake but confused,  3. No F.N deficits, ALL C.Nerves Intact, moving all 4 extremities but left leg much less due to pain, Plantars down going.  4. Ears and Eyes appear Normal, Conjunctivae clear, PERRLA. Moist Oral Mucosa.  5. Supple Neck, No JVD, No cervical lymphadenopathy appriciated, No Carotid Bruits.  6. Symmetrical Chest wall movement, Good air movement bilaterally, CTAB.  7. RRR, No Gallops, Rubs or Murmurs, No Parasternal Heave.  8. Positive Bowel Sounds, Abdomen Soft, No tenderness, No organomegaly appriciated,No rebound -guarding or rigidity.  9.  No Cyanosis, Normal Skin Turgor, No Skin Rash or Bruise.  10. Good muscle tone,  joints appear normal , no effusions, Normal ROM.  11. No Palpable Lymph Nodes in Neck or Axillae      Data Review:    CBC Recent Labs  Lab 07/26/18 0440 07/26/18 0948  WBC 9.2 13.1*  HGB 13.7 13.3  HCT 46.2* 41.9  PLT 227 200  MCV 94.5 90.5  MCH 28.0 28.7  MCHC 29.7* 31.7  RDW 13.6 13.4  LYMPHSABS 1.7  --   MONOABS 0.8  --   EOSABS 0.1  --   BASOSABS 0.0  --    ------------------------------------------------------------------------------------------------------------------  Chemistries  Recent Labs  Lab 07/26/18 0440  NA 143  K 4.4  CL 107  CO2 24  GLUCOSE 107*  BUN 28*  CREATININE 1.44*  CALCIUM 9.4   ------------------------------------------------------------------------------------------------------------------ CrCl cannot be calculated (Unknown ideal weight.). ------------------------------------------------------------------------------------------------------------------ No results for input(s): TSH, T4TOTAL, T3FREE, THYROIDAB in the last 72 hours.  Invalid input(s): FREET3  Coagulation profile Recent Labs  Lab 07/26/18 0440  INR 1.08    ------------------------------------------------------------------------------------------------------------------- No results for input(s): DDIMER in the last 72 hours. -------------------------------------------------------------------------------------------------------------------  Cardiac Enzymes No results for input(s): CKMB, TROPONINI, MYOGLOBIN in the last 168 hours.  Invalid input(s): CK ------------------------------------------------------------------------------------------------------------------    Component Value Date/Time   BNP 92.7 02/12/2017 2244     ---------------------------------------------------------------------------------------------------------------  Urinalysis    Component Value Date/Time   COLORURINE YELLOW 10/28/2017 2327   APPEARANCEUR HAZY (A) 10/28/2017 2327   LABSPEC 1.020 10/28/2017 2327   PHURINE 5.0 10/28/2017 2327   GLUCOSEU NEGATIVE 10/28/2017 2327   HGBUR SMALL (A) 10/28/2017 2327   BILIRUBINUR NEGATIVE 10/28/2017 2327   KETONESUR 5 (A) 10/28/2017 2327   PROTEINUR 30 (A) 10/28/2017 2327   NITRITE NEGATIVE 10/28/2017 2327   LEUKOCYTESUR NEGATIVE 10/28/2017 2327    ----------------------------------------------------------------------------------------------------------------   Imaging Results:    Dg Chest 1 View  Result Date: 07/26/2018 CLINICAL DATA:  Post unwitnessed fall. Patient reports left hip pain. EXAM: CHEST  1 VIEW COMPARISON:  Radiograph 10/29/2017 FINDINGS: Patient is rotated. Unchanged cardiomegaly and mediastinal contours allowing for rotation. Aortic atherosclerosis. No pneumothorax or focal airspace disease. No large pleural effusion. The bones are under mineralized. No acute osseous abnormalities are seen. Bilateral proximal humeral arthroplasties. IMPRESSION: Unchanged cardiomegaly and Aortic Atherosclerosis (ICD10-I70.0). No acute findings. Electronically Signed   By: Narda Rutherford M.D.   On: 07/26/2018 06:15    Ct Head Wo Contrast  Result Date: 07/26/2018 CLINICAL DATA:  Post unwitnessed fall. Head trauma, minor, pt on anticoagulation; C-spine trauma, high clinical risk (NEXUS/CCR) EXAM: CT HEAD WITHOUT CONTRAST CT CERVICAL SPINE WITHOUT CONTRAST TECHNIQUE: Multidetector CT imaging of the head and cervical spine was performed following the standard protocol without intravenous contrast. Multiplanar CT image reconstructions of the cervical spine were also generated. COMPARISON:  Head CT 10/29/2017. Head and cervical spine CT 09/23/2017 FINDINGS: Despite best efforts by technologist, patient motion artifact due to medical condition. CT HEAD FINDINGS Brain: Motion artifact limits assessment. Generalized atrophy and chronic small vessel ischemia. Remote infarct in the right parietal lobe. Remote lacunar infarct in the right basal ganglia. No intracranial hemorrhage, mass effect, or midline shift. No hydrocephalus. The basilar cisterns are patent. No evidence of territorial infarct or acute ischemia. Calcified meningioma versus prominent hyperextends cyst in the right frontal region, unchanged from previous. Possible calcified dural-based meningioma in the left parietal region, also unchanged. Vascular: Atherosclerosis of skullbase vasculature without hyperdense vessel or abnormal calcification. Skull: No skull fracture. Prominent right frontal hyperostosis versus partially calcified meningioma, unchanged from priors. Sinuses/Orbits: No acute findings. Bilateral cataract resection. Other: None. CT CERVICAL SPINE FINDINGS Alignment: Prominent dextroscoliotic curvature of the cervical spine, slightly progressed from prior exam. Bony fusion of C3-C4 and to lesser extent C4-C5 with straightening of normal lordosis. Chronic anterolisthesis of C7 on T1. No traumatic subluxation. Skull base and vertebrae: Motion limits assessment for acute fracture. Allowing for this, no acute fractures seen. Chronic bony ankylosis of C3-C4,  vertebral bodies and posterior elements, partial bony ankylosis of C4-C5 vertebral bodies. This is unchanged. The dens is intact. No gross skull base fracture. Soft tissues and spinal canal: No prevertebral fluid or swelling. No visible canal hematoma. Disc levels: Complete disc space loss at C3-C4 with bony ankylosis. Partial ankylosis of C4-C5 with disc space loss. Additional disc space narrowing and endplate spurring throughout the cervical spine. Upper chest: Biapical pleuroparenchymal scarring. Probable enlarged thyroid gland again seen. Other: None. IMPRESSION: 1. Motion limits detailed evaluation, particularly  through the cervical spine. 2. No acute intracranial abnormality. No skull fracture. 3. Generalized atrophy and chronic small vessel ischemia. Remote right parietal infarct. 4. Prominent multilevel degenerative change throughout the cervical spine without evidence of acute fracture or subluxation. Electronically Signed   By: Narda Rutherford M.D.   On: 07/26/2018 06:31   Ct Cervical Spine Wo Contrast  Result Date: 07/26/2018 CLINICAL DATA:  Post unwitnessed fall. Head trauma, minor, pt on anticoagulation; C-spine trauma, high clinical risk (NEXUS/CCR) EXAM: CT HEAD WITHOUT CONTRAST CT CERVICAL SPINE WITHOUT CONTRAST TECHNIQUE: Multidetector CT imaging of the head and cervical spine was performed following the standard protocol without intravenous contrast. Multiplanar CT image reconstructions of the cervical spine were also generated. COMPARISON:  Head CT 10/29/2017. Head and cervical spine CT 09/23/2017 FINDINGS: Despite best efforts by technologist, patient motion artifact due to medical condition. CT HEAD FINDINGS Brain: Motion artifact limits assessment. Generalized atrophy and chronic small vessel ischemia. Remote infarct in the right parietal lobe. Remote lacunar infarct in the right basal ganglia. No intracranial hemorrhage, mass effect, or midline shift. No hydrocephalus. The basilar  cisterns are patent. No evidence of territorial infarct or acute ischemia. Calcified meningioma versus prominent hyperextends cyst in the right frontal region, unchanged from previous. Possible calcified dural-based meningioma in the left parietal region, also unchanged. Vascular: Atherosclerosis of skullbase vasculature without hyperdense vessel or abnormal calcification. Skull: No skull fracture. Prominent right frontal hyperostosis versus partially calcified meningioma, unchanged from priors. Sinuses/Orbits: No acute findings. Bilateral cataract resection. Other: None. CT CERVICAL SPINE FINDINGS Alignment: Prominent dextroscoliotic curvature of the cervical spine, slightly progressed from prior exam. Bony fusion of C3-C4 and to lesser extent C4-C5 with straightening of normal lordosis. Chronic anterolisthesis of C7 on T1. No traumatic subluxation. Skull base and vertebrae: Motion limits assessment for acute fracture. Allowing for this, no acute fractures seen. Chronic bony ankylosis of C3-C4, vertebral bodies and posterior elements, partial bony ankylosis of C4-C5 vertebral bodies. This is unchanged. The dens is intact. No gross skull base fracture. Soft tissues and spinal canal: No prevertebral fluid or swelling. No visible canal hematoma. Disc levels: Complete disc space loss at C3-C4 with bony ankylosis. Partial ankylosis of C4-C5 with disc space loss. Additional disc space narrowing and endplate spurring throughout the cervical spine. Upper chest: Biapical pleuroparenchymal scarring. Probable enlarged thyroid gland again seen. Other: None. IMPRESSION: 1. Motion limits detailed evaluation, particularly through the cervical spine. 2. No acute intracranial abnormality. No skull fracture. 3. Generalized atrophy and chronic small vessel ischemia. Remote right parietal infarct. 4. Prominent multilevel degenerative change throughout the cervical spine without evidence of acute fracture or subluxation. Electronically  Signed   By: Narda Rutherford M.D.   On: 07/26/2018 06:31   Dg Hip Unilat W Or Wo Pelvis 2-3 Views Left  Result Date: 07/26/2018 CLINICAL DATA:  Left hip pain after unwitnessed fall. EXAM: DG HIP (WITH OR WITHOUT PELVIS) 2-3V LEFT COMPARISON:  Pelvis radiograph 02/15/2017 FINDINGS: Displaced left femoral neck fracture with proximal migration of the femoral shaft. Mild apex anterior angulation. Femoral head remains seated. Pubic rami are intact. No additional fracture of the pelvis. Calcifications in the pelvis may be calcified fibroids, unchanged from pelvic radiograph 02/15/2017. IMPRESSION: Displaced left femoral neck fracture with proximal migration of the femoral shaft. Electronically Signed   By: Narda Rutherford M.D.   On: 07/26/2018 06:16    My personal review of EKG: Rhythm NSR, RBBB, no Acute ST changes   Assessment & Plan:  1.  Mechanical fall with left femoral shaft fracture.  Orthopedics consulted by ER, since she takes Eliquis likely surgery 48 hours from now that will be Monday afternoon or Tuesday morning.  She will be a moderate to high risk candidate for perioperative complications, this was communicated by me to patient's daughter Velna HatchetSheila who is the primary decision maker.  She agrees and accepts the risk and wants to proceed with surgery thereafter SNF if needed.  2.  HX of PE in 2017.  Was on Eliquis currently on hold.  3.  Chronic diastolic CHF.  EF 60% on echocardiogram in 2018.  Currently compensated.  4.  Essential hypertension.  Home dose beta-blocker continued, PRN IV hydralazine and Lopressor ordered.  5.  Advanced underlying Alzheimer's dementia with delirium.  Home medications continued PRN Haldol ordered.  Will minimize the use of narcotics and benzodiazepines and try to use Haldol for agitation.  6.  ARF on CKD 3.  Baseline creatinine around 1.2.  Gently hydrate and monitor.    DVT Prophylaxis Heparin  AM Labs Ordered, also please review Full  Orders  Family Communication: Admission, patients condition and plan of care including tests being ordered have been discussed with the patient daughter who indicates understanding and agree with the plan and Code Status.  Code Status DNR  Likely DC to  SNF  Condition GUARDED    Consults called: Ortho by EDP    Admission status: Inpt    Time spent in minutes : 35   Susa RaringPrashant Singh M.D on 07/26/2018 at 10:20 AM  To page go to [email or web address] - password [email or web address]

## 2018-07-26 NOTE — ED Notes (Signed)
Pt back from x-ray.

## 2018-07-26 NOTE — ED Notes (Signed)
Patient transported to X-ray 

## 2018-07-26 NOTE — ED Notes (Addendum)
Patient agitated, constantly moving saying she wants to "get out of here". Patient will not hold her arm still for an accurate BP.   MD made aware.

## 2018-07-26 NOTE — Progress Notes (Signed)
Orthopedic Tech Progress Note Patient Details:  Laurena BeringDorothy Griffin 01/28/1926 [id number]  Musculoskeletal Traction Type of Traction: Bucks Skin Traction Traction Weight: 10 lbs   Post Interventions Patient Tolerated: Well Instructions Provided: Care of device   Saul FordyceJennifer C Koontz 07/26/2018, 8:57 AM

## 2018-07-26 NOTE — Consult Note (Signed)
Reason for Consult:left hip fracture Referring Physician: hospitalist service  Roberta Griffin is an 84 y.o. female.  HPI: Roberta Griffin  is a 84 y.o. female, history of PE in 2017, multinodular goiter, CKD 3 baseline creatinine around 1.2, severe underlying dementia lives in a memory care unit, history of CVA, chronic diastolic CHF with EF 60% on recent echocardiogram done in 2018.  Patient originally from [place] moved to this area in a memory care unit to be close to her family in 2017 brought in after a mechanical fall after which she had left-sided leg pain.  In the ER found to have femoral shaft fracture and admitted to the hospital under my service.  Orthopedics was consulted by the ER.  As patient takes Eliquis urgent surgery could not be done.  Patient unable to provide any history due to advanced dementia. History provided through family by Dr Thedore Mins   Past Medical History:  Diagnosis Date  . COPD (chronic obstructive pulmonary disease) (HCC)   . Dementia (HCC)   . Depression   . Dysphagia   . Hypertension   . Scoliosis     Past Surgical History:  Procedure Laterality Date  . ABDOMINAL HYSTERECTOMY    . KNEE SURGERY    . SHOULDER SURGERY    . VITRECTOMY AND CATARACT      Family History  Problem Relation Age of Onset  . Stroke Neg Hx     Social History:  reports that she has never smoked. She has never used smokeless tobacco. She reports that she does not drink alcohol or use drugs.  Allergies:  Allergies  Allergen Reactions  . Tramadol Other (See Comments)    twitching     Results for orders placed or performed during the hospital encounter of 07/26/18 (from the past 48 hour(s))  CBC with Differential/Platelet     Status: Abnormal   Collection Time: 07/26/18  4:40 AM  Result Value Ref Range   WBC 9.2 4.0 - 10.5 K/uL   RBC 4.89 3.87 - 5.11 MIL/uL   Hemoglobin 13.7 12.0 - 15.0 g/dL   HCT 16.1 (H) 09.6 - 04.5 %   MCV 94.5 80.0 - 100.0 fL   MCH 28.0 26.0 - 34.0  pg   MCHC 29.7 (L) 30.0 - 36.0 g/dL   RDW 40.9 81.1 - 91.4 %   Platelets 227 150 - 400 K/uL   nRBC 0.0 0.0 - 0.2 %   Neutrophils Relative % 72 %   Neutro Abs 6.6 1.7 - 7.7 K/uL   Lymphocytes Relative 18 %   Lymphs Abs 1.7 0.7 - 4.0 K/uL   Monocytes Relative 9 %   Monocytes Absolute 0.8 0.1 - 1.0 K/uL   Eosinophils Relative 1 %   Eosinophils Absolute 0.1 0.0 - 0.5 K/uL   Basophils Relative 0 %   Basophils Absolute 0.0 0.0 - 0.1 K/uL   Immature Granulocytes 0 %   Abs Immature Granulocytes 0.04 0.00 - 0.07 K/uL    Comment: Performed at [facility], [address] [place], [place] [address]  Basic metabolic panel     Status: Abnormal   Collection Time: 07/26/18  4:40 AM  Result Value Ref Range   Sodium 143 135 - 145 mmol/L   Potassium 4.4 3.5 - 5.1 mmol/L   Chloride 107 98 - 111 mmol/L   CO2 24 22 - 32 mmol/L   Glucose, Bld 107 (H) 70 - 99 mg/dL   BUN 28 (H) 8 - 23 mg/dL  Creatinine, Ser 1.44 (H) 0.44 - 1.00 mg/dL   Calcium 9.4 8.9 - 16.1 mg/dL   GFR calc non Af Amer 32 (L) >60 mL/min   GFR calc Af Amer 37 (L) >60 mL/min   Anion gap 12 5 - 15    Comment: Performed at [facility], [address], [place], [place] [address]  Protime-INR     Status: None   Collection Time: 07/26/18  4:40 AM  Result Value Ref Range   Prothrombin Time 13.9 11.4 - 15.2 seconds   INR 1.08     Comment: Performed at [facility], [address], [place], [place] [address]  MRSA PCR Screening     Status: None   Collection Time: 07/26/18  8:20 AM  Result Value Ref Range   MRSA by PCR NEGATIVE NEGATIVE    Comment:        The GeneXpert MRSA Assay (FDA approved for NASAL specimens only), is one component of a comprehensive MRSA colonization surveillance program. It is not intended to diagnose MRSA infection nor to guide or monitor treatment for MRSA infections. Performed at [facility], [address], [place], [place] [address]   CBC     Status: Abnormal    Collection Time: 07/26/18  9:48 AM  Result Value Ref Range   WBC 13.1 (H) 4.0 - 10.5 K/uL   RBC 4.63 3.87 - 5.11 MIL/uL   Hemoglobin 13.3 12.0 - 15.0 g/dL   HCT 78.2 95.6 - 21.3 %   MCV 90.5 80.0 - 100.0 fL   MCH 28.7 26.0 - 34.0 pg   MCHC 31.7 30.0 - 36.0 g/dL   RDW 08.6 57.8 - 46.9 %   Platelets 200 150 - 400 K/uL   nRBC 0.0 0.0 - 0.2 %    Comment: Performed at [facility], [address], [place], [place] [address]  Creatinine, serum     Status: Abnormal   Collection Time: 07/26/18  9:48 AM  Result Value Ref Range   Creatinine, Ser 1.49 (H) 0.44 - 1.00 mg/dL   GFR calc non Af Amer 31 (L) >60 mL/min   GFR calc Af Amer 35 (L) >60 mL/min    Comment: Performed at [facility], [address], [place], [place] [address]  Type and screen [facility]     Status: None   Collection Time: 07/26/18  9:54 AM  Result Value Ref Range   ABO/RH(D) O POS    Antibody Screen NEG    Sample Expiration      07/29/2018 Performed at [facility], [address], [place], [place] [address]   ABO/Rh     Status: None (Preliminary result)   Collection Time: 07/26/18  9:54 AM  Result Value Ref Range   ABO/RH(D)      O POS Performed at [facility], [address] [place], [place] [address]     Dg Chest 1 View  Result Date: 07/26/2018 CLINICAL DATA:  Post unwitnessed fall. Patient reports left hip pain. EXAM: CHEST  1 VIEW COMPARISON:  Radiograph 10/29/2017 FINDINGS: Patient is rotated. Unchanged cardiomegaly and mediastinal contours allowing for rotation. Aortic atherosclerosis. No pneumothorax or focal airspace disease. No large pleural effusion. The bones are under mineralized. No acute osseous abnormalities are seen. Bilateral proximal humeral arthroplasties. IMPRESSION: Unchanged cardiomegaly and Aortic Atherosclerosis (ICD10-I70.0). No acute findings. Electronically Signed   By: Narda Rutherford M.D.   On: 07/26/2018 06:15  Ct Head Wo  Contrast  Result Date: 07/26/2018 CLINICAL DATA:  Post unwitnessed fall. Head trauma, minor, pt on anticoagulation; C-spine trauma, high clinical risk (NEXUS/CCR) EXAM: CT HEAD WITHOUT CONTRAST CT CERVICAL SPINE WITHOUT CONTRAST TECHNIQUE: Multidetector CT imaging of the head and cervical spine was performed following the standard protocol without intravenous contrast. Multiplanar CT image reconstructions of the cervical spine were also generated. COMPARISON:  Head CT 10/29/2017. Head and cervical spine CT 09/23/2017 FINDINGS: Despite best efforts by technologist, patient motion artifact due to medical condition. CT HEAD FINDINGS Brain: Motion artifact limits assessment. Generalized atrophy and chronic small vessel ischemia. Remote infarct in the right parietal lobe. Remote lacunar infarct in the right basal ganglia. No intracranial hemorrhage, mass effect, or midline shift. No hydrocephalus. The basilar cisterns are patent. No evidence of territorial infarct or acute ischemia. Calcified meningioma versus prominent hyperextends cyst in the right frontal region, unchanged from previous. Possible calcified dural-based meningioma in the left parietal region, also unchanged. Vascular: Atherosclerosis of skullbase vasculature without hyperdense vessel or abnormal calcification. Skull: No skull fracture. Prominent right frontal hyperostosis versus partially calcified meningioma, unchanged from priors. Sinuses/Orbits: No acute findings. Bilateral cataract resection. Other: None. CT CERVICAL SPINE FINDINGS Alignment: Prominent dextroscoliotic curvature of the cervical spine, slightly progressed from prior exam. Bony fusion of C3-C4 and to lesser extent C4-C5 with straightening of normal lordosis. Chronic anterolisthesis of C7 on T1. No traumatic subluxation. Skull base and vertebrae: Motion limits assessment for acute fracture. Allowing for this, no acute fractures seen. Chronic bony ankylosis of C3-C4, vertebral bodies  and posterior elements, partial bony ankylosis of C4-C5 vertebral bodies. This is unchanged. The dens is intact. No gross skull base fracture. Soft tissues and spinal canal: No prevertebral fluid or swelling. No visible canal hematoma. Disc levels: Complete disc space loss at C3-C4 with bony ankylosis. Partial ankylosis of C4-C5 with disc space loss. Additional disc space narrowing and endplate spurring throughout the cervical spine. Upper chest: Biapical pleuroparenchymal scarring. Probable enlarged thyroid gland again seen. Other: None. IMPRESSION: 1. Motion limits detailed evaluation, particularly through the cervical spine. 2. No acute intracranial abnormality. No skull fracture. 3. Generalized atrophy and chronic small vessel ischemia. Remote right parietal infarct. 4. Prominent multilevel degenerative change throughout the cervical spine without evidence of acute fracture or subluxation. Electronically Signed   By: Narda RutherfordMelanie  Sanford M.D.   On: 07/26/2018 06:31   Ct Cervical Spine Wo Contrast  Result Date: 07/26/2018 CLINICAL DATA:  Post unwitnessed fall. Head trauma, minor, pt on anticoagulation; C-spine trauma, high clinical risk (NEXUS/CCR) EXAM: CT HEAD WITHOUT CONTRAST CT CERVICAL SPINE WITHOUT CONTRAST TECHNIQUE: Multidetector CT imaging of the head and cervical spine was performed following the standard protocol without intravenous contrast. Multiplanar CT image reconstructions of the cervical spine were also generated. COMPARISON:  Head CT 10/29/2017. Head and cervical spine CT 09/23/2017 FINDINGS: Despite best efforts by technologist, patient motion artifact due to medical condition. CT HEAD FINDINGS Brain: Motion artifact limits assessment. Generalized atrophy and chronic small vessel ischemia. Remote infarct in the right parietal lobe. Remote lacunar infarct in the right basal ganglia. No intracranial hemorrhage, mass effect, or midline shift. No hydrocephalus. The basilar cisterns are patent. No  evidence of territorial infarct or acute ischemia. Calcified meningioma versus prominent hyperextends cyst in the right frontal region, unchanged from previous. Possible calcified dural-based meningioma in the left parietal region, also unchanged. Vascular: Atherosclerosis of skullbase vasculature without hyperdense vessel or abnormal calcification. Skull: No skull fracture. Prominent right frontal hyperostosis  versus partially calcified meningioma, unchanged from priors. Sinuses/Orbits: No acute findings. Bilateral cataract resection. Other: None. CT CERVICAL SPINE FINDINGS Alignment: Prominent dextroscoliotic curvature of the cervical spine, slightly progressed from prior exam. Bony fusion of C3-C4 and to lesser extent C4-C5 with straightening of normal lordosis. Chronic anterolisthesis of C7 on T1. No traumatic subluxation. Skull base and vertebrae: Motion limits assessment for acute fracture. Allowing for this, no acute fractures seen. Chronic bony ankylosis of C3-C4, vertebral bodies and posterior elements, partial bony ankylosis of C4-C5 vertebral bodies. This is unchanged. The dens is intact. No gross skull base fracture. Soft tissues and spinal canal: No prevertebral fluid or swelling. No visible canal hematoma. Disc levels: Complete disc space loss at C3-C4 with bony ankylosis. Partial ankylosis of C4-C5 with disc space loss. Additional disc space narrowing and endplate spurring throughout the cervical spine. Upper chest: Biapical pleuroparenchymal scarring. Probable enlarged thyroid gland again seen. Other: None. IMPRESSION: 1. Motion limits detailed evaluation, particularly through the cervical spine. 2. No acute intracranial abnormality. No skull fracture. 3. Generalized atrophy and chronic small vessel ischemia. Remote right parietal infarct. 4. Prominent multilevel degenerative change throughout the cervical spine without evidence of acute fracture or subluxation. Electronically Signed   By: Narda Rutherford M.D.   On: 07/26/2018 06:31   Dg Hip Unilat W Or Wo Pelvis 2-3 Views Left  Result Date: 07/26/2018 CLINICAL DATA:  Left hip pain after unwitnessed fall. EXAM: DG HIP (WITH OR WITHOUT PELVIS) 2-3V LEFT COMPARISON:  Pelvis radiograph 02/15/2017 FINDINGS: Displaced left femoral neck fracture with proximal migration of the femoral shaft. Mild apex anterior angulation. Femoral head remains seated. Pubic rami are intact. No additional fracture of the pelvis. Calcifications in the pelvis may be calcified fibroids, unchanged from pelvic radiograph 02/15/2017. IMPRESSION: Displaced left femoral neck fracture with proximal migration of the femoral shaft. Electronically Signed   By: Narda Rutherford M.D.   On: 07/26/2018 06:16    ROS Blood pressure (!) 165/87, pulse 75, temperature 99 F (37.2 C), resp. rate 20, SpO2 93 %. Physical Examlying in bed with 5lbs of Buck's traction-appears comfortable in no acute distress.mild external rotation of LLE c/w femoral neck fracture. Skin intact. Appears to have had bil TKR's without problem  Assessment/Plan:displaced subcapital fracture left hip in Buck's traction. Will need prosthe   Valeria Batman 07/26/2018, 10:54 AM

## 2018-07-26 NOTE — ED Provider Notes (Signed)
[facility] EMERGENCY DEPARTMENT Provider Note   CSN: [id number] Arrival date & time: 07/26/18  4098     History   Chief Complaint Chief Complaint  Patient presents with  . Fall    HPI Roberta Griffin is a 84 y.o. female.  Patient presents to the emergency department for evaluation after a fall.  Patient currently resides in a memory care skilled nursing facility.  Patient has history of dementia, cannot provide any further information.  Patient complaining of severe left hip pain upon arrival to the ER. Level V Caveat due to dementia.     Past Medical History:  Diagnosis Date  . COPD (chronic obstructive pulmonary disease) (HCC)   . Dementia (HCC)   . Depression   . Dysphagia   . Hypertension   . Scoliosis     Patient Active Problem List   Diagnosis Date Noted  . Depression with anxiety 07/26/2017  . High risk medication use 06/03/2017  . Chronic pain of both shoulders 06/03/2017  . Schizophrenia (HCC) 04/26/2017  . GAD (generalized anxiety disorder) 04/05/2017  . History of stroke 12/05/2016  . Pulmonary embolus (HCC) 12/05/2016  . Chronic diastolic CHF (congestive heart failure) (HCC) 03/04/2016  . Insomnia 03/04/2016  . Dysarthria 03/04/2016  . Thyroid nodule 03/04/2016  . Pancreatic mass   . HLD (hyperlipidemia)   . Dysphagia 02/18/2016  . CVA (cerebral vascular accident) (HCC) 02/17/2016  . COPD (chronic obstructive pulmonary disease) (HCC) 02/17/2016  . Alzheimer's dementia without behavioral disturbance (HCC) 02/17/2016  . Essential hypertension 02/17/2016  . Normocytic anemia 02/17/2016  . CKD (chronic kidney disease), stage III (HCC) 02/17/2016    Past Surgical History:  Procedure Laterality Date  . ABDOMINAL HYSTERECTOMY    . KNEE SURGERY    . SHOULDER SURGERY    . VITRECTOMY AND CATARACT       OB History   No obstetric history on file.      Home Medications    Prior to Admission medications   Medication Sig  Start Date End Date Taking? Authorizing Provider  acetaminophen (TYLENOL) 500 MG tablet Take 500 mg by mouth every 8 (eight) hours.  02/19/18  Yes [provider]  apixaban (ELIQUIS) 2.5 MG TABS tablet Take 2.5 mg by mouth 2 (two) times daily.   Yes [provider]  busPIRone (BUSPAR) 15 MG tablet Take 15 mg by mouth 2 (two) times daily. 02/06/18  Yes [provider]  citalopram (CELEXA) 10 MG tablet Take 10 mg by mouth daily.   Yes [provider]  divalproex (DEPAKOTE) 125 MG DR tablet Take 125 mg by mouth daily.   Yes [provider]  ENSURE (ENSURE) Take 120 mLs by mouth 3 (three) times daily between meals. 04/21/18  Yes [provider]  Melatonin 3 MG TABS Take 3 mg by mouth at bedtime.  05/18/18  Yes [provider]  memantine (NAMENDA XR) 14 MG CP24 24 hr capsule Take 14 mg by mouth daily.   Yes [provider]  metoprolol succinate (TOPROL-XL) 50 MG 24 hr tablet Take 1 tablet (50 mg total) by mouth daily. Take with or immediately following a meal. 02/09/16  Yes Mesner, Barbara Cower, MD  Nutritional Supplements (NUTRITIONAL SUPPLEMENT PO) Frozen Nutritional Treat - Give by mouth three times daily after meals   Yes [provider]  risperiDONE (RISPERDAL) 0.25 MG tablet Take 0.25 mg by mouth at bedtime.    Yes [provider]  Vitamin D, Ergocalciferol, (DRISDOL) 50000 units  CAPS capsule Take 50,000 Units by mouth every 30 (thirty) days.    Yes [provider]    Family History Family History  Problem Relation Age of Onset  . Stroke Neg Hx     Social History Social History   Tobacco Use  . Smoking status: Never Smoker  . Smokeless tobacco: Never Used  Substance Use Topics  . Alcohol use: No  . Drug use: No     Allergies   Tramadol   Review of Systems Review of Systems  Unable to perform ROS: Dementia     Physical Exam Updated Vital Signs BP (!) 177/99   Pulse 72   Temp 97.7 F  (36.5 C) (Oral)   Resp 19   SpO2 94%   Physical Exam Vitals signs and nursing note reviewed.  Constitutional:      General: She is in acute distress.     Appearance: Normal appearance. She is well-developed.  HENT:     Head: Normocephalic and atraumatic.     Right Ear: Hearing normal.     Left Ear: Hearing normal.     Nose: Nose normal.  Eyes:     Conjunctiva/sclera: Conjunctivae normal.     Pupils: Pupils are equal, round, and reactive to light.  Neck:     Musculoskeletal: Normal range of motion and neck supple.  Cardiovascular:     Rate and Rhythm: Regular rhythm.     Heart sounds: S1 normal and S2 normal. No murmur. No friction rub. No gallop.   Pulmonary:     Effort: Pulmonary effort is normal. No respiratory distress.     Breath sounds: Normal breath sounds.  Chest:     Chest wall: No tenderness.  Abdominal:     General: Bowel sounds are normal.     Palpations: Abdomen is soft.     Tenderness: There is no abdominal tenderness. There is no guarding or rebound. Negative signs include Murphy's sign and McBurney's sign.     Hernia: No hernia is present.  Musculoskeletal:     Left hip: She exhibits decreased range of motion, tenderness and deformity (Shortened).  Skin:    General: Skin is warm and dry.     Findings: No rash.  Neurological:     Mental Status: She is alert and oriented to person, place, and time.     GCS: GCS eye subscore is 4. GCS verbal subscore is 5. GCS motor subscore is 6.     Cranial Nerves: No cranial nerve deficit.     Sensory: No sensory deficit.     Coordination: Coordination normal.  Psychiatric:        Speech: Speech normal.        Behavior: Behavior normal.        Thought Content: Thought content normal.      ED Treatments / Results  Labs (all labs ordered are listed, but only abnormal results are displayed) Labs Reviewed  CBC WITH DIFFERENTIAL/PLATELET - Abnormal; Notable for the following components:      Result Value   HCT 46.2  (*)    MCHC 29.7 (*)    All other components within normal limits  BASIC METABOLIC PANEL - Abnormal; Notable for the following components:   Glucose, Bld 107 (*)    BUN 28 (*)    Creatinine, Ser 1.44 (*)    GFR calc non Af Amer 32 (*)    GFR calc Af Amer 37 (*)    All other components within normal limits  PROTIME-INR    EKG None  Radiology Dg Chest 1 View  Result Date: 07/26/2018 CLINICAL DATA:  Post unwitnessed fall. Patient reports left hip pain. EXAM: CHEST  1 VIEW COMPARISON:  Radiograph 10/29/2017 FINDINGS: Patient is rotated. Unchanged cardiomegaly and mediastinal contours allowing for rotation. Aortic atherosclerosis. No pneumothorax or focal airspace disease. No large pleural effusion. The bones are under mineralized. No acute osseous abnormalities are seen. Bilateral proximal humeral arthroplasties. IMPRESSION: Unchanged cardiomegaly and Aortic Atherosclerosis (ICD10-I70.0). No acute findings. Electronically Signed   By: Narda Rutherford M.D.   On: 07/26/2018 06:15   Ct Head Wo Contrast  Result Date: 07/26/2018 CLINICAL DATA:  Post unwitnessed fall. Head trauma, minor, pt on anticoagulation; C-spine trauma, high clinical risk (NEXUS/CCR) EXAM: CT HEAD WITHOUT CONTRAST CT CERVICAL SPINE WITHOUT CONTRAST TECHNIQUE: Multidetector CT imaging of the head and cervical spine was performed following the standard protocol without intravenous contrast. Multiplanar CT image reconstructions of the cervical spine were also generated. COMPARISON:  Head CT 10/29/2017. Head and cervical spine CT 09/23/2017 FINDINGS: Despite best efforts by technologist, patient motion artifact due to medical condition. CT HEAD FINDINGS Brain: Motion artifact limits assessment. Generalized atrophy and chronic small vessel ischemia. Remote infarct in the right parietal lobe. Remote lacunar infarct in the right basal ganglia. No intracranial hemorrhage, mass effect, or midline shift. No hydrocephalus. The basilar  cisterns are patent. No evidence of territorial infarct or acute ischemia. Calcified meningioma versus prominent hyperextends cyst in the right frontal region, unchanged from previous. Possible calcified dural-based meningioma in the left parietal region, also unchanged. Vascular: Atherosclerosis of skullbase vasculature without hyperdense vessel or abnormal calcification. Skull: No skull fracture. Prominent right frontal hyperostosis versus partially calcified meningioma, unchanged from priors. Sinuses/Orbits: No acute findings. Bilateral cataract resection. Other: None. CT CERVICAL SPINE FINDINGS Alignment: Prominent dextroscoliotic curvature of the cervical spine, slightly progressed from prior exam. Bony fusion of C3-C4 and to lesser extent C4-C5 with straightening of normal lordosis. Chronic anterolisthesis of C7 on T1. No traumatic subluxation. Skull base and vertebrae: Motion limits assessment for acute fracture. Allowing for this, no acute fractures seen. Chronic bony ankylosis of C3-C4, vertebral bodies and posterior elements, partial bony ankylosis of C4-C5 vertebral bodies. This is unchanged. The dens is intact. No gross skull base fracture. Soft tissues and spinal canal: No prevertebral fluid or swelling. No visible canal hematoma. Disc levels: Complete disc space loss at C3-C4 with bony ankylosis. Partial ankylosis of C4-C5 with disc space loss. Additional disc space narrowing and endplate spurring throughout the cervical spine. Upper chest: Biapical pleuroparenchymal scarring. Probable enlarged thyroid gland again seen. Other: None. IMPRESSION: 1. Motion limits detailed evaluation, particularly through the cervical spine. 2. No acute intracranial abnormality. No skull fracture. 3. Generalized atrophy and chronic small vessel ischemia. Remote right parietal infarct. 4. Prominent multilevel degenerative change throughout the cervical spine without evidence of acute fracture or subluxation. Electronically  Signed   By: Narda Rutherford M.D.   On: 07/26/2018 06:31   Ct Cervical Spine Wo Contrast  Result Date: 07/26/2018 CLINICAL DATA:  Post unwitnessed fall. Head trauma, minor, pt on anticoagulation; C-spine trauma, high clinical risk (NEXUS/CCR) EXAM: CT HEAD WITHOUT CONTRAST CT CERVICAL SPINE WITHOUT CONTRAST TECHNIQUE: Multidetector CT imaging of the head and cervical spine was performed following the standard protocol without intravenous contrast. Multiplanar CT image reconstructions of the cervical spine were also generated. COMPARISON:  Head CT 10/29/2017. Head and cervical spine CT 09/23/2017 FINDINGS: Despite best efforts by technologist, patient motion artifact due  to medical condition. CT HEAD FINDINGS Brain: Motion artifact limits assessment. Generalized atrophy and chronic small vessel ischemia. Remote infarct in the right parietal lobe. Remote lacunar infarct in the right basal ganglia. No intracranial hemorrhage, mass effect, or midline shift. No hydrocephalus. The basilar cisterns are patent. No evidence of territorial infarct or acute ischemia. Calcified meningioma versus prominent hyperextends cyst in the right frontal region, unchanged from previous. Possible calcified dural-based meningioma in the left parietal region, also unchanged. Vascular: Atherosclerosis of skullbase vasculature without hyperdense vessel or abnormal calcification. Skull: No skull fracture. Prominent right frontal hyperostosis versus partially calcified meningioma, unchanged from priors. Sinuses/Orbits: No acute findings. Bilateral cataract resection. Other: None. CT CERVICAL SPINE FINDINGS Alignment: Prominent dextroscoliotic curvature of the cervical spine, slightly progressed from prior exam. Bony fusion of C3-C4 and to lesser extent C4-C5 with straightening of normal lordosis. Chronic anterolisthesis of C7 on T1. No traumatic subluxation. Skull base and vertebrae: Motion limits assessment for acute fracture. Allowing  for this, no acute fractures seen. Chronic bony ankylosis of C3-C4, vertebral bodies and posterior elements, partial bony ankylosis of C4-C5 vertebral bodies. This is unchanged. The dens is intact. No gross skull base fracture. Soft tissues and spinal canal: No prevertebral fluid or swelling. No visible canal hematoma. Disc levels: Complete disc space loss at C3-C4 with bony ankylosis. Partial ankylosis of C4-C5 with disc space loss. Additional disc space narrowing and endplate spurring throughout the cervical spine. Upper chest: Biapical pleuroparenchymal scarring. Probable enlarged thyroid gland again seen. Other: None. IMPRESSION: 1. Motion limits detailed evaluation, particularly through the cervical spine. 2. No acute intracranial abnormality. No skull fracture. 3. Generalized atrophy and chronic small vessel ischemia. Remote right parietal infarct. 4. Prominent multilevel degenerative change throughout the cervical spine without evidence of acute fracture or subluxation. Electronically Signed   By: Narda Rutherford M.D.   On: 07/26/2018 06:31   Dg Hip Unilat W Or Wo Pelvis 2-3 Views Left  Result Date: 07/26/2018 CLINICAL DATA:  Left hip pain after unwitnessed fall. EXAM: DG HIP (WITH OR WITHOUT PELVIS) 2-3V LEFT COMPARISON:  Pelvis radiograph 02/15/2017 FINDINGS: Displaced left femoral neck fracture with proximal migration of the femoral shaft. Mild apex anterior angulation. Femoral head remains seated. Pubic rami are intact. No additional fracture of the pelvis. Calcifications in the pelvis may be calcified fibroids, unchanged from pelvic radiograph 02/15/2017. IMPRESSION: Displaced left femoral neck fracture with proximal migration of the femoral shaft. Electronically Signed   By: Narda Rutherford M.D.   On: 07/26/2018 06:16    Procedures Procedures (including critical care time)  Medications Ordered in ED Medications  morphine 2 MG/ML injection 2 mg (has no administration in time range)    ondansetron (ZOFRAN) injection 4 mg (has no administration in time range)     Initial Impression / Assessment and Plan / ED Course  I have reviewed the triage vital signs and the nursing notes.  Pertinent labs & imaging results that were available during my care of the patient were reviewed by me and considered in my medical decision making (see chart for details).     Patient with history of dementia presents to the emergency department after a fall at skilled nursing facility.  Patient cannot provide any further information.  She is complaining of left hip pain.  Examination reveals shortening and inability to range the hip without severe worsening pain.  X-ray confirms femoral neck fracture.  Remainder of examination unremarkable.  No evidence of additional injury.  She did undergo CT  head and cervical spine, no acute pathology noted.  Patient does take Eliquis (history of chronic PE), last dose 8 PM December 13.  Discussed with Dr. Adolm JosephWhitlock, on-call for orthopedics.  Will see patient.  No surgery today.  Final Clinical Impressions(s) / ED Diagnoses   Final diagnoses:  Closed fracture of neck of left femur, initial encounter [facility](HCC)    ED Discharge Orders    None       Pollina, Canary Brimhristopher J, MD 07/27/18 [phone]

## 2018-07-27 ENCOUNTER — Encounter (HOSPITAL_COMMUNITY)
Admission: EM | Disposition: A | Discharge status: Skilled Nursing Facility | Payer: Self-pay | Source: Home / Self Care | Attending: Internal Medicine

## 2018-07-27 LAB — BASIC METABOLIC PANEL
Anion gap: 12 (ref 5–15)
BUN: 34 mg/dL — ABNORMAL HIGH (ref 8–23)
CO2: 25 mmol/L (ref 22–32)
Calcium: 8.9 mg/dL (ref 8.9–10.3)
Chloride: 105 mmol/L (ref 98–111)
Creatinine, Ser: 2.02 mg/dL — ABNORMAL HIGH (ref 0.44–1.00)
GFR calc Af Amer: 25 mL/min — ABNORMAL LOW (ref >60)
GFR calc non Af Amer: 21 mL/min — ABNORMAL LOW (ref >60)
Glucose, Bld: 116 mg/dL — ABNORMAL HIGH (ref 70–99)
Potassium: 4.3 mmol/L (ref 3.5–5.1)
Sodium: 142 mmol/L (ref 135–145)

## 2018-07-27 LAB — CBC
HCT: 40 % (ref 36.0–46.0)
Hemoglobin: 12.7 g/dL (ref 12.0–15.0)
MCH: 28.5 pg (ref 26.0–34.0)
MCHC: 31.8 g/dL (ref 30.0–36.0)
MCV: 89.7 fL (ref 80.0–100.0)
Platelets: 193 10*3/uL (ref 150–400)
RBC: 4.46 MIL/uL (ref 3.87–5.11)
RDW: 13.7 % (ref 11.5–15.5)
WBC: 10.6 10*3/uL — ABNORMAL HIGH (ref 4.0–10.5)
nRBC: 0 % (ref 0.0–0.2)

## 2018-07-27 LAB — PROTIME-INR
INR: 1.16
Prothrombin Time: 14.7 seconds (ref 11.4–15.2)

## 2018-07-27 SURGERY — HEMIARTHROPLASTY, HIP, DIRECT ANTERIOR APPROACH, FOR FRACTURE
Anesthesia: General | Laterality: Left

## 2018-07-27 MED ORDER — LACTATED RINGERS IV SOLN
INTRAVENOUS | Status: AC
  Administered 2018-07-27 – 2018-07-28 (×2): via INTRAVENOUS

## 2018-07-27 NOTE — Progress Notes (Signed)
PROGRESS NOTE                                                                                                                                                                                                             Patient Demographics:    Roberta Griffin, is a 84 y.o. female, DOB - 06/17/1926, MVH:[id number]  Admit date - 07/26/2018   Admitting Physician Leroy SeaPrashant K Singh, MD  Outpatient Primary MD for the patient is Patient, No Pcp Per  LOS - 1  Chief Complaint  Patient presents with  . Fall       Brief Narrative   Roberta Griffin  is a 84 y.o. female, history of PE in 2017, multinodular goiter, CKD 3 baseline creatinine around 1.2, severe underlying dementia lives in a memory care unit, history of CVA, chronic diastolic CHF with EF 60% on recent echocardiogram done in 2018.  [place] in a memory care unit to be close to her family in 2017 brought in after a mechanical fall after which she had left-sided leg pain.  In the ER found to have femoral shaft fracture and admitted to the hospital under my service.  Orthopedics was consulted by the ER.  As patient takes Eliquis urgent surgery could not be done.  Patient unable to provide any history due to advanced dementia.  History obtained by daughter over the phone.   Subjective:    Roberta Griffin today in bed appears to be in no distress but significantly confused, unreliable historian.   Assessment  & Plan :     1.  Mechanical fall with left femoral shaft fracture. Consulted orthopedics after admission discussed with Dr Marion DownerAder.  Since she is on Eliquis surgery has been delayed to possibly Monday morning, currently in Buck's traction with supportive care, she remains to be be a moderate to high risk candidate for perioperative complications, this was communicated by me to patient's daughter Velna HatchetSheila who is the primary decision maker.  She agrees and accepts the risk and wants to proceed  with surgery thereafter SNF if needed.  2.  HX of PE in 2017.  Was on Eliquis currently on hold and on DVT prophylactic dose heparin.  Postop heparin to be commenced once orthopedic surgery clears the patient for that.  3.  Chronic diastolic CHF.  EF 60% on echocardiogram in 2018.  Currently compensated.  4.  Essential hypertension.  Home dose beta-blocker continued, PRN IV hydralazine and Lopressor ordered.  5.  Advanced underlying Alzheimer's dementia with delirium.  Home medications continued PRN Haldol ordered.  Will minimize the use of narcotics and benzodiazepines and try to use Haldol for agitation.  6.  ARF on CKD 3.  Baseline creatinine around 1.2.  Function worsening, gently hydrate, check bladder scan to rule out obstruction.   Family Communication  :  daughter  Code Status :  DNR  Disposition Plan  :  SNF  Consults  : Ortho  Procedures  :     DVT Prophylaxis  :   Heparin    Lab Results  Component Value Date   PLT 193 07/27/2018    Diet :  Diet Order            DIET SOFT Room service appropriate? Yes; Fluid consistency: Thin  Diet effective now               Inpatient Medications Scheduled Meds: . acetaminophen  500 mg Oral Q8H  . busPIRone  15 mg Oral BID  . citalopram  10 mg Oral Daily  . divalproex  125 mg Oral Daily  . heparin  5,000 Units Subcutaneous Q8H  . memantine  14 mg Oral Daily  . metoprolol succinate  50 mg Oral Daily  . risperiDONE  0.25 mg Oral QHS  . sodium chloride flush  3 mL Intravenous Q12H  . Vitamin D (Ergocalciferol)  50,000 Units Oral Q30 days   Continuous Infusions: . lactated ringers     PRN Meds:.bisacodyl, hydrALAZINE, HYDROcodone-acetaminophen, magnesium hydroxide, metoprolol tartrate  Antibiotics  :   Anti-infectives (From admission, onward)   None          Objective:   Vitals:   07/26/18 0827 07/26/18 1341 07/26/18 1953 07/27/18 0302  BP: (!) 165/87 116/70 121/60 109/79  Pulse: 75 77 80 87  Resp:  20 18 16 16   Temp: 99 F (37.2 C)  97.6 F (36.4 C) 100.2 F (37.9 C)  TempSrc:   Oral Oral  SpO2: 93% (!) 89% (!) 86% 92%  Weight: 52.4 kg     Height: 5\' 1"  (1.549 m)       Wt Readings from Last 3 Encounters:  07/26/18 52.4 kg  05/26/18 51.7 kg  03/24/18 49.6 kg     Intake/Output Summary (Last 24 hours) at 07/27/2018 0947 Last data filed at 07/27/2018 0500 Gross per 24 hour  Intake -  Output 300 ml  Net -300 ml     Physical Exam  Awake but extremely confused, No new F.N deficits,   [place].AT,PERRAL Supple Neck,No JVD, No cervical lymphadenopathy appriciated.  Symmetrical Chest wall movement, Good air movement bilaterally, CTAB RRR,No Gallops,Rubs or new Murmurs, No Parasternal Heave +ve B.Sounds, Abd Soft, No tenderness, No organomegaly appriciated, No rebound - guarding or rigidity. No Cyanosis, Clubbing or edema, left leg in Buck's traction    Data Review:    CBC Recent Labs  Lab 07/26/18 0440 07/26/18 0948 07/27/18 0508  WBC 9.2 13.1* 10.6*  HGB 13.7 13.3 12.7  HCT 46.2* 41.9 40.0  PLT 227 200 193  MCV 94.5 90.5 89.7  MCH 28.0 28.7 28.5  MCHC 29.7* 31.7 31.8  RDW 13.6 13.4 13.7  LYMPHSABS 1.7  --   --   MONOABS 0.8  --   --   EOSABS 0.1  --   --   BASOSABS 0.0  --   --     Chemistries  Recent Labs  Lab 07/26/18 0440 07/26/18 0948 07/27/18 0508  NA 143  --  142  K 4.4  --  4.3  CL 107  --  105  CO2 24  --  25  GLUCOSE 107*  --  116*  BUN 28*  --  34*  CREATININE 1.44* 1.49* 2.02*  CALCIUM 9.4  --  8.9   ------------------------------------------------------------------------------------------------------------------ No results for input(s): CHOL, HDL, LDLCALC, TRIG, CHOLHDL, LDLDIRECT in the last 72 hours.  Lab Results  Component Value Date   HGBA1C 5.6 02/18/2016   ------------------------------------------------------------------------------------------------------------------ No results for input(s): TSH, T4TOTAL, T3FREE, THYROIDAB  in the last 72 hours.  Invalid input(s): FREET3 ------------------------------------------------------------------------------------------------------------------ No results for input(s): VITAMINB12, FOLATE, FERRITIN, TIBC, IRON, RETICCTPCT in the last 72 hours.  Coagulation profile Recent Labs  Lab 07/26/18 0440 07/27/18 0508  INR 1.08 1.16    No results for input(s): DDIMER in the last 72 hours.  Cardiac Enzymes No results for input(s): CKMB, TROPONINI, MYOGLOBIN in the last 168 hours.  Invalid input(s): CK ------------------------------------------------------------------------------------------------------------------    Component Value Date/Time   BNP 92.7 02/12/2017 2244    Micro Results Recent Results (from the past 240 hour(s))  MRSA PCR Screening     Status: None   Collection Time: 07/26/18  8:20 AM  Result Value Ref Range Status   MRSA by PCR NEGATIVE NEGATIVE Final    Comment:        The GeneXpert MRSA Assay (FDA approved for NASAL specimens only), is one component of a comprehensive MRSA colonization surveillance program. It is not intended to diagnose MRSA infection nor to guide or monitor treatment for MRSA infections. Performed at [facility], [address], [place], [place] [address]     Radiology Reports Dg Chest 1 View  Result Date: 07/26/2018 CLINICAL DATA:  Post unwitnessed fall. Patient reports left hip pain. EXAM: CHEST  1 VIEW COMPARISON:  Radiograph 10/29/2017 FINDINGS: Patient is rotated. Unchanged cardiomegaly and mediastinal contours allowing for rotation. Aortic atherosclerosis. No pneumothorax or focal airspace disease. No large pleural effusion. The bones are under mineralized. No acute osseous abnormalities are seen. Bilateral proximal humeral arthroplasties. IMPRESSION: Unchanged cardiomegaly and Aortic Atherosclerosis (ICD10-I70.0). No acute findings. Electronically Signed   By: Narda Rutherford M.D.   On: 07/26/2018 06:15    Ct Head Wo Contrast  Result Date: 07/26/2018 CLINICAL DATA:  Post unwitnessed fall. Head trauma, minor, pt on anticoagulation; C-spine trauma, high clinical risk (NEXUS/CCR) EXAM: CT HEAD WITHOUT CONTRAST CT CERVICAL SPINE WITHOUT CONTRAST TECHNIQUE: Multidetector CT imaging of the head and cervical spine was performed following the standard protocol without intravenous contrast. Multiplanar CT image reconstructions of the cervical spine were also generated. COMPARISON:  Head CT 10/29/2017. Head and cervical spine CT 09/23/2017 FINDINGS: Despite best efforts by technologist, patient motion artifact due to medical condition. CT HEAD FINDINGS Brain: Motion artifact limits assessment. Generalized atrophy and chronic small vessel ischemia. Remote infarct in the right parietal lobe. Remote lacunar infarct in the right basal ganglia. No intracranial hemorrhage, mass effect, or midline shift. No hydrocephalus. The basilar cisterns are patent. No evidence of territorial infarct or acute ischemia. Calcified meningioma versus prominent hyperextends cyst in the right frontal region, unchanged from previous. Possible calcified dural-based meningioma in the left parietal region, also unchanged. Vascular: Atherosclerosis of skullbase vasculature without hyperdense vessel or abnormal calcification. Skull: No skull fracture. Prominent right frontal hyperostosis versus partially calcified meningioma, unchanged from priors. Sinuses/Orbits: No acute findings. Bilateral cataract resection. Other: None. CT CERVICAL SPINE FINDINGS Alignment: Prominent dextroscoliotic curvature of the cervical spine, slightly progressed from prior exam.  Bony fusion of C3-C4 and to lesser extent C4-C5 with straightening of normal lordosis. Chronic anterolisthesis of C7 on T1. No traumatic subluxation. Skull base and vertebrae: Motion limits assessment for acute fracture. Allowing for this, no acute fractures seen. Chronic bony ankylosis of C3-C4,  vertebral bodies and posterior elements, partial bony ankylosis of C4-C5 vertebral bodies. This is unchanged. The dens is intact. No gross skull base fracture. Soft tissues and spinal canal: No prevertebral fluid or swelling. No visible canal hematoma. Disc levels: Complete disc space loss at C3-C4 with bony ankylosis. Partial ankylosis of C4-C5 with disc space loss. Additional disc space narrowing and endplate spurring throughout the cervical spine. Upper chest: Biapical pleuroparenchymal scarring. Probable enlarged thyroid gland again seen. Other: None. IMPRESSION: 1. Motion limits detailed evaluation, particularly through the cervical spine. 2. No acute intracranial abnormality. No skull fracture. 3. Generalized atrophy and chronic small vessel ischemia. Remote right parietal infarct. 4. Prominent multilevel degenerative change throughout the cervical spine without evidence of acute fracture or subluxation. Electronically Signed   By: Narda Rutherford M.D.   On: 07/26/2018 06:31   Ct Cervical Spine Wo Contrast  Result Date: 07/26/2018 CLINICAL DATA:  Post unwitnessed fall. Head trauma, minor, pt on anticoagulation; C-spine trauma, high clinical risk (NEXUS/CCR) EXAM: CT HEAD WITHOUT CONTRAST CT CERVICAL SPINE WITHOUT CONTRAST TECHNIQUE: Multidetector CT imaging of the head and cervical spine was performed following the standard protocol without intravenous contrast. Multiplanar CT image reconstructions of the cervical spine were also generated. COMPARISON:  Head CT 10/29/2017. Head and cervical spine CT 09/23/2017 FINDINGS: Despite best efforts by technologist, patient motion artifact due to medical condition. CT HEAD FINDINGS Brain: Motion artifact limits assessment. Generalized atrophy and chronic small vessel ischemia. Remote infarct in the right parietal lobe. Remote lacunar infarct in the right basal ganglia. No intracranial hemorrhage, mass effect, or midline shift. No hydrocephalus. The basilar  cisterns are patent. No evidence of territorial infarct or acute ischemia. Calcified meningioma versus prominent hyperextends cyst in the right frontal region, unchanged from previous. Possible calcified dural-based meningioma in the left parietal region, also unchanged. Vascular: Atherosclerosis of skullbase vasculature without hyperdense vessel or abnormal calcification. Skull: No skull fracture. Prominent right frontal hyperostosis versus partially calcified meningioma, unchanged from priors. Sinuses/Orbits: No acute findings. Bilateral cataract resection. Other: None. CT CERVICAL SPINE FINDINGS Alignment: Prominent dextroscoliotic curvature of the cervical spine, slightly progressed from prior exam. Bony fusion of C3-C4 and to lesser extent C4-C5 with straightening of normal lordosis. Chronic anterolisthesis of C7 on T1. No traumatic subluxation. Skull base and vertebrae: Motion limits assessment for acute fracture. Allowing for this, no acute fractures seen. Chronic bony ankylosis of C3-C4, vertebral bodies and posterior elements, partial bony ankylosis of C4-C5 vertebral bodies. This is unchanged. The dens is intact. No gross skull base fracture. Soft tissues and spinal canal: No prevertebral fluid or swelling. No visible canal hematoma. Disc levels: Complete disc space loss at C3-C4 with bony ankylosis. Partial ankylosis of C4-C5 with disc space loss. Additional disc space narrowing and endplate spurring throughout the cervical spine. Upper chest: Biapical pleuroparenchymal scarring. Probable enlarged thyroid gland again seen. Other: None. IMPRESSION: 1. Motion limits detailed evaluation, particularly through the cervical spine. 2. No acute intracranial abnormality. No skull fracture. 3. Generalized atrophy and chronic small vessel ischemia. Remote right parietal infarct. 4. Prominent multilevel degenerative change throughout the cervical spine without evidence of acute fracture or subluxation. Electronically  Signed   By: Narda Rutherford M.D.   On: 07/26/2018 06:31  Dg Hip Unilat W Or Wo Pelvis 2-3 Views Left  Result Date: 07/26/2018 CLINICAL DATA:  Left hip pain after unwitnessed fall. EXAM: DG HIP (WITH OR WITHOUT PELVIS) 2-3V LEFT COMPARISON:  Pelvis radiograph 02/15/2017 FINDINGS: Displaced left femoral neck fracture with proximal migration of the femoral shaft. Mild apex anterior angulation. Femoral head remains seated. Pubic rami are intact. No additional fracture of the pelvis. Calcifications in the pelvis may be calcified fibroids, unchanged from pelvic radiograph 02/15/2017. IMPRESSION: Displaced left femoral neck fracture with proximal migration of the femoral shaft. Electronically Signed   By: Narda Rutherford M.D.   On: 07/26/2018 06:16    Time Spent in minutes  30   Susa Raring M.D on 07/27/2018 at 9:47 AM  To page go to [email or web address] - password [email or web address]

## 2018-07-27 NOTE — Plan of Care (Signed)
  Problem: Clinical Measurements: Goal: Respiratory complications will improve Outcome: Progressing Goal: Cardiovascular complication will be avoided Outcome: Progressing   Problem: Pain Managment: Goal: General experience of comfort will improve Outcome: Progressing   Problem: Coping: Goal: Level of anxiety will decrease Outcome: Not Progressing  Pt is very anxious when being moved, battle with you for everything, feel pt is scared and not understanding, pulled IV out, Mitts applied, pt much more content

## 2018-07-27 NOTE — H&P (Signed)
TRH H&P   Patient Demographics:    Roberta Griffin, is a 84 y.o. female  MRN: [id number]   DOB - 09/24/1925  Admit Date - 07/26/2018  Outpatient Primary MD for the patient is Patient, No Pcp Per  Patient coming from: SNF  Chief Complaint  Patient presents with  . Fall      HPI:    Roberta BeringDorothy Mccaster  is a 84 y.o. female, history of PE in 2017, multinodular goiter, CKD 3 baseline creatinine around 1.2, severe underlying dementia lives in a memory care unit, history of CVA, chronic diastolic CHF with EF 60% on recent echocardiogram done in 2018.  [place] area in a memory care unit to be close to her family in 2017 brought in after a mechanical fall after which she had left-sided leg pain.  In the ER found to have femoral shaft fracture and admitted to the hospital under my service.  Orthopedics was consulted by the ER.  As patient takes Eliquis urgent surgery could not be done.  Patient unable to provide any history due to advanced dementia.  History obtained by daughter over the phone.    Review of systems:    ROS not obtainable due to advanced dementia.  With Past History of the following :    Past Medical History:  Diagnosis Date  . COPD (chronic obstructive pulmonary disease) (HCC)   . Dementia (HCC)   . Depression   . Dysphagia   . Hypertension   . Scoliosis       Past Surgical History:  Procedure Laterality Date  . ABDOMINAL HYSTERECTOMY    . KNEE SURGERY    . SHOULDER SURGERY    . VITRECTOMY AND CATARACT        Social History:     Social History   Tobacco Use  . Smoking status: Never Smoker  . Smokeless tobacco: Never Used  Substance Use Topics  .  Alcohol use: No         Family History :     Family History  Problem Relation Age of Onset  . Stroke Neg Hx        Home Medications:   Prior to Admission medications   Medication Sig Start Date End Date Taking? Authorizing Provider  acetaminophen (TYLENOL) 500 MG tablet Take  500 mg by mouth every 8 (eight) hours.  02/19/18  Yes [provider]  apixaban (ELIQUIS) 2.5 MG TABS tablet Take 2.5 mg by mouth 2 (two) times daily.   Yes [provider]  busPIRone (BUSPAR) 15 MG tablet Take 15 mg by mouth 2 (two) times daily. 02/06/18  Yes [provider]  citalopram (CELEXA) 10 MG tablet Take 10 mg by mouth daily.   Yes [provider]  divalproex (DEPAKOTE) 125 MG DR tablet Take 125 mg by mouth daily.   Yes [provider]  ENSURE (ENSURE) Take 120 mLs by mouth 3 (three) times daily between meals. 04/21/18  Yes [provider]  Melatonin 3 MG TABS Take 3 mg by mouth at bedtime.  05/18/18  Yes [provider]  memantine (NAMENDA XR) 14 MG CP24 24 hr capsule Take 14 mg by mouth daily.   Yes [provider]  metoprolol succinate (TOPROL-XL) 50 MG 24 hr tablet Take 1 tablet (50 mg total) by mouth daily. Take with or immediately following a meal. 02/09/16  Yes Mesner, Barbara Cower, MD  Nutritional Supplements (NUTRITIONAL SUPPLEMENT PO) Frozen Nutritional Treat - Give by mouth three times daily after meals   Yes [provider]  risperiDONE (RISPERDAL) 0.25 MG tablet Take 0.25 mg by mouth at bedtime.    Yes [provider]  Vitamin D, Ergocalciferol, (DRISDOL) 50000 units CAPS capsule Take 50,000 Units by mouth every 30 (thirty) days.    Yes [provider]     Allergies:     Allergies  Allergen Reactions  . Tramadol Other (See Comments)    twitching     Physical Exam:   Vitals  Blood pressure 109/79, pulse 87, temperature 100.2 F (37.9 C), temperature source Oral, resp. rate 16, height 5\' 1"   (1.549 m), weight 52.4 kg, SpO2 92 %.   1. General elderly African-American female lying in hospital bed confused but in no distress,  2. Normal affect and insight, Not Suicidal or Homicidal, Awake but confused,  3. No F.N deficits, ALL C.Nerves Intact, moving all 4 extremities but left leg much less due to pain, Plantars down going.  4. Ears and Eyes appear Normal, Conjunctivae clear, PERRLA. Moist Oral Mucosa.  5. Supple Neck, No JVD, No cervical lymphadenopathy appriciated, No Carotid Bruits.  6. Symmetrical Chest wall movement, Good air movement bilaterally, CTAB.  7. RRR, No Gallops, Rubs or Murmurs, No Parasternal Heave.  8. Positive Bowel Sounds, Abdomen Soft, No tenderness, No organomegaly appriciated,No rebound -guarding or rigidity.  9.  No Cyanosis, Normal Skin Turgor, No Skin Rash or Bruise.  10. Good muscle tone,  joints appear normal , no effusions, Normal ROM.  11. No Palpable Lymph Nodes in Neck or Axillae      Data Review:    CBC Recent Labs  Lab 07/26/18 0440 07/26/18 0948 07/27/18 0508  WBC 9.2 13.1* 10.6*  HGB 13.7 13.3 12.7  HCT 46.2* 41.9 40.0  PLT 227 200 193  MCV 94.5 90.5 89.7  MCH 28.0 28.7 28.5  MCHC 29.7* 31.7 31.8  RDW 13.6 13.4 13.7  LYMPHSABS 1.7  --   --   MONOABS 0.8  --   --   EOSABS 0.1  --   --   BASOSABS 0.0  --   --    ------------------------------------------------------------------------------------------------------------------  Chemistries  Recent Labs  Lab 07/26/18 0440 07/26/18 0948 07/27/18 0508  NA 143  --  142  K 4.4  --  4.3  CL 107  --  105  CO2 24  --  25  GLUCOSE 107*  --  116*  BUN 28*  --  34*  CREATININE 1.44* 1.49* 2.02*  CALCIUM 9.4  --  8.9   ------------------------------------------------------------------------------------------------------------------ estimated creatinine clearance is 14 mL/min (A) (by C-G formula based on SCr of 2.02 mg/dL  (H)). ------------------------------------------------------------------------------------------------------------------ No results for input(s): TSH, T4TOTAL, T3FREE, THYROIDAB in the last 72 hours.  Invalid input(s): FREET3  Coagulation profile Recent Labs  Lab 07/26/18 0440 07/27/18 0508  INR 1.08 1.16   ------------------------------------------------------------------------------------------------------------------- No results for input(s): DDIMER in the last 72 hours. -------------------------------------------------------------------------------------------------------------------  Cardiac Enzymes No results for input(s): CKMB, TROPONINI, MYOGLOBIN in the last 168 hours.  Invalid input(s): CK ------------------------------------------------------------------------------------------------------------------    Component Value Date/Time   BNP 92.7 02/12/2017 2244     ---------------------------------------------------------------------------------------------------------------  Urinalysis    Component Value Date/Time   COLORURINE YELLOW 10/28/2017 2327   APPEARANCEUR HAZY (A) 10/28/2017 2327   LABSPEC 1.020 10/28/2017 2327   PHURINE 5.0 10/28/2017 2327   GLUCOSEU NEGATIVE 10/28/2017 2327   HGBUR SMALL (A) 10/28/2017 2327   BILIRUBINUR NEGATIVE 10/28/2017 2327   KETONESUR 5 (A) 10/28/2017 2327   PROTEINUR 30 (A) 10/28/2017 2327   NITRITE NEGATIVE 10/28/2017 2327   LEUKOCYTESUR NEGATIVE 10/28/2017 2327    ----------------------------------------------------------------------------------------------------------------   Imaging Results:    Dg Chest 1 View  Result Date: 07/26/2018 CLINICAL DATA:  Post unwitnessed fall. Patient reports left hip pain. EXAM: CHEST  1 VIEW COMPARISON:  Radiograph 10/29/2017 FINDINGS: Patient is rotated. Unchanged cardiomegaly and mediastinal contours allowing for rotation. Aortic atherosclerosis. No pneumothorax or focal airspace  disease. No large pleural effusion. The bones are under mineralized. No acute osseous abnormalities are seen. Bilateral proximal humeral arthroplasties. IMPRESSION: Unchanged cardiomegaly and Aortic Atherosclerosis (ICD10-I70.0). No acute findings. Electronically Signed   By: Narda Rutherford M.D.   On: 07/26/2018 06:15   Ct Head Wo Contrast  Result Date: 07/26/2018 CLINICAL DATA:  Post unwitnessed fall. Head trauma, minor, pt on anticoagulation; C-spine trauma, high clinical risk (NEXUS/CCR) EXAM: CT HEAD WITHOUT CONTRAST CT CERVICAL SPINE WITHOUT CONTRAST TECHNIQUE: Multidetector CT imaging of the head and cervical spine was performed following the standard protocol without intravenous contrast. Multiplanar CT image reconstructions of the cervical spine were also generated. COMPARISON:  Head CT 10/29/2017. Head and cervical spine CT 09/23/2017 FINDINGS: Despite best efforts by technologist, patient motion artifact due to medical condition. CT HEAD FINDINGS Brain: Motion artifact limits assessment. Generalized atrophy and chronic small vessel ischemia. Remote infarct in the right parietal lobe. Remote lacunar infarct in the right basal ganglia. No intracranial hemorrhage, mass effect, or midline shift. No hydrocephalus. The basilar cisterns are patent. No evidence of territorial infarct or acute ischemia. Calcified meningioma versus prominent hyperextends cyst in the right frontal region, unchanged from previous. Possible calcified dural-based meningioma in the left parietal region, also unchanged. Vascular: Atherosclerosis of skullbase vasculature without hyperdense vessel or abnormal calcification. Skull: No skull fracture. Prominent right frontal hyperostosis versus partially calcified meningioma, unchanged from priors. Sinuses/Orbits: No acute findings. Bilateral cataract resection. Other: None. CT CERVICAL SPINE FINDINGS Alignment: Prominent dextroscoliotic curvature of the cervical spine, slightly  progressed from prior exam. Bony fusion of C3-C4 and to lesser extent C4-C5 with straightening of normal lordosis. Chronic anterolisthesis of C7 on T1. No traumatic subluxation. Skull base and vertebrae: Motion limits assessment for acute fracture. Allowing for this, no acute fractures seen. Chronic bony ankylosis of C3-C4, vertebral bodies and posterior elements, partial bony ankylosis of C4-C5 vertebral bodies.  This is unchanged. The dens is intact. No gross skull base fracture. Soft tissues and spinal canal: No prevertebral fluid or swelling. No visible canal hematoma. Disc levels: Complete disc space loss at C3-C4 with bony ankylosis. Partial ankylosis of C4-C5 with disc space loss. Additional disc space narrowing and endplate spurring throughout the cervical spine. Upper chest: Biapical pleuroparenchymal scarring. Probable enlarged thyroid gland again seen. Other: None. IMPRESSION: 1. Motion limits detailed evaluation, particularly through the cervical spine. 2. No acute intracranial abnormality. No skull fracture. 3. Generalized atrophy and chronic small vessel ischemia. Remote right parietal infarct. 4. Prominent multilevel degenerative change throughout the cervical spine without evidence of acute fracture or subluxation. Electronically Signed   By: Narda Rutherford M.D.   On: 07/26/2018 06:31   Ct Cervical Spine Wo Contrast  Result Date: 07/26/2018 CLINICAL DATA:  Post unwitnessed fall. Head trauma, minor, pt on anticoagulation; C-spine trauma, high clinical risk (NEXUS/CCR) EXAM: CT HEAD WITHOUT CONTRAST CT CERVICAL SPINE WITHOUT CONTRAST TECHNIQUE: Multidetector CT imaging of the head and cervical spine was performed following the standard protocol without intravenous contrast. Multiplanar CT image reconstructions of the cervical spine were also generated. COMPARISON:  Head CT 10/29/2017. Head and cervical spine CT 09/23/2017 FINDINGS: Despite best efforts by technologist, patient motion artifact due  to medical condition. CT HEAD FINDINGS Brain: Motion artifact limits assessment. Generalized atrophy and chronic small vessel ischemia. Remote infarct in the right parietal lobe. Remote lacunar infarct in the right basal ganglia. No intracranial hemorrhage, mass effect, or midline shift. No hydrocephalus. The basilar cisterns are patent. No evidence of territorial infarct or acute ischemia. Calcified meningioma versus prominent hyperextends cyst in the right frontal region, unchanged from previous. Possible calcified dural-based meningioma in the left parietal region, also unchanged. Vascular: Atherosclerosis of skullbase vasculature without hyperdense vessel or abnormal calcification. Skull: No skull fracture. Prominent right frontal hyperostosis versus partially calcified meningioma, unchanged from priors. Sinuses/Orbits: No acute findings. Bilateral cataract resection. Other: None. CT CERVICAL SPINE FINDINGS Alignment: Prominent dextroscoliotic curvature of the cervical spine, slightly progressed from prior exam. Bony fusion of C3-C4 and to lesser extent C4-C5 with straightening of normal lordosis. Chronic anterolisthesis of C7 on T1. No traumatic subluxation. Skull base and vertebrae: Motion limits assessment for acute fracture. Allowing for this, no acute fractures seen. Chronic bony ankylosis of C3-C4, vertebral bodies and posterior elements, partial bony ankylosis of C4-C5 vertebral bodies. This is unchanged. The dens is intact. No gross skull base fracture. Soft tissues and spinal canal: No prevertebral fluid or swelling. No visible canal hematoma. Disc levels: Complete disc space loss at C3-C4 with bony ankylosis. Partial ankylosis of C4-C5 with disc space loss. Additional disc space narrowing and endplate spurring throughout the cervical spine. Upper chest: Biapical pleuroparenchymal scarring. Probable enlarged thyroid gland again seen. Other: None. IMPRESSION: 1. Motion limits detailed evaluation,  particularly through the cervical spine. 2. No acute intracranial abnormality. No skull fracture. 3. Generalized atrophy and chronic small vessel ischemia. Remote right parietal infarct. 4. Prominent multilevel degenerative change throughout the cervical spine without evidence of acute fracture or subluxation. Electronically Signed   By: Narda Rutherford M.D.   On: 07/26/2018 06:31   Dg Hip Unilat W Or Wo Pelvis 2-3 Views Left  Result Date: 07/26/2018 CLINICAL DATA:  Left hip pain after unwitnessed fall. EXAM: DG HIP (WITH OR WITHOUT PELVIS) 2-3V LEFT COMPARISON:  Pelvis radiograph 02/15/2017 FINDINGS: Displaced left femoral neck fracture with proximal migration of the femoral shaft. Mild apex anterior angulation. Femoral head  remains seated. Pubic rami are intact. No additional fracture of the pelvis. Calcifications in the pelvis may be calcified fibroids, unchanged from pelvic radiograph 02/15/2017. IMPRESSION: Displaced left femoral neck fracture with proximal migration of the femoral shaft. Electronically Signed   By: Narda Rutherford M.D.   On: 07/26/2018 06:16    My personal review of EKG: Rhythm NSR, RBBB, no Acute ST changes   Assessment & Plan:      1.  Mechanical fall with left femoral shaft fracture.  Orthopedics consulted by ER, since she takes Eliquis likely surgery 48 hours from now that will be Monday afternoon or Tuesday morning.  She will be a moderate to high risk candidate for perioperative complications, this was communicated by me to patient's daughter Velna Hatchet who is the primary decision maker.  She agrees and accepts the risk and wants to proceed with surgery thereafter SNF if needed.  2.  HX of PE in 2017.  Was on Eliquis currently on hold.  3.  Chronic diastolic CHF.  EF 60% on echocardiogram in 2018.  Currently compensated.  4.  Essential hypertension.  Home dose beta-blocker continued, PRN IV hydralazine and Lopressor ordered.  5.  Advanced underlying Alzheimer's  dementia with delirium.  Home medications continued PRN Haldol ordered.  Will minimize the use of narcotics and benzodiazepines and try to use Haldol for agitation.  6.  ARF on CKD 3.  Baseline creatinine around 1.2.  Gently hydrate and monitor.    DVT Prophylaxis Heparin  AM Labs Ordered, also please review Full Orders  Family Communication: Admission, patients condition and plan of care including tests being ordered have been discussed with the patient daughter who indicates understanding and agree with the plan and Code Status.  Code Status DNR  Likely DC to  SNF  Condition GUARDED    Consults called: Ortho by EDP    Admission status: Inpt    Time spent in minutes : 35   Susa Raring M.D on 07/27/2018 at 9:46 AM  To page go to [email or web address] - password [facility]

## 2018-07-28 ENCOUNTER — Encounter (HOSPITAL_COMMUNITY): Payer: Self-pay | Admitting: *Deleted

## 2018-07-28 LAB — BASIC METABOLIC PANEL
Anion gap: 12 (ref 5–15)
BUN: 28 mg/dL — ABNORMAL HIGH (ref 8–23)
CO2: 24 mmol/L (ref 22–32)
Calcium: 8.6 mg/dL — ABNORMAL LOW (ref 8.9–10.3)
Chloride: 106 mmol/L (ref 98–111)
Creatinine, Ser: 1.73 mg/dL — ABNORMAL HIGH (ref 0.44–1.00)
GFR calc Af Amer: 30 mL/min — ABNORMAL LOW (ref >60)
GFR calc non Af Amer: 26 mL/min — ABNORMAL LOW (ref >60)
Glucose, Bld: 118 mg/dL — ABNORMAL HIGH (ref 70–99)
Potassium: 3.9 mmol/L (ref 3.5–5.1)
Sodium: 142 mmol/L (ref 135–145)

## 2018-07-28 LAB — CBC
HCT: 38.7 % (ref 36.0–46.0)
Hemoglobin: 12.4 g/dL (ref 12.0–15.0)
MCH: 28.6 pg (ref 26.0–34.0)
MCHC: 32 g/dL (ref 30.0–36.0)
MCV: 89.4 fL (ref 80.0–100.0)
Platelets: 173 10*3/uL (ref 150–400)
RBC: 4.33 MIL/uL (ref 3.87–5.11)
RDW: 13.7 % (ref 11.5–15.5)
WBC: 8.9 10*3/uL (ref 4.0–10.5)
nRBC: 0 % (ref 0.0–0.2)

## 2018-07-28 LAB — MAGNESIUM: Magnesium: 2 mg/dL (ref 1.7–2.4)

## 2018-07-28 MED ORDER — LACTATED RINGERS IV SOLN
INTRAVENOUS | Status: AC
  Administered 2018-07-28 – 2018-07-29 (×2): via INTRAVENOUS

## 2018-07-28 NOTE — Progress Notes (Signed)
PROGRESS NOTE                                                                                                                                                                                                             Patient Demographics:    Roberta Griffin, is a 84 y.o. female, DOB - 01/14/1926, ZOX:[id number]  Admit date - 07/26/2018   Admitting Physician Leroy SeaPrashant K Singh, MD  Outpatient Primary MD for the patient is Patient, No Pcp Per  LOS - 2  Chief Complaint  Patient presents with  . Fall       Brief Narrative   Roberta BeringDorothy Griffin  is a 84 y.o. female, history of PE in 2017, multinodular goiter, CKD 3 baseline creatinine around 1.2, severe underlying dementia lives in a memory care unit, history of CVA, chronic diastolic CHF with EF 60% on recent echocardiogram done in 2018.  [place] in [place] memory care unit to be close to her family in 2017 brought in after a mechanical fall after which she had left-sided leg pain.  In the ER found to have femoral shaft fracture and admitted to the hospital under my service.  Orthopedics was consulted by the ER.  As patient takes Eliquis urgent surgery could not be done.  Patient unable to provide any history due to advanced dementia.  History obtained by daughter over the phone.   Subjective:    Patient in bed, mildly confused, poor historian, denies any pains or SOB   Assessment  & Plan :     1.  Mechanical fall with left femoral shaft fracture. Consulted orthopedics after admission discussed with Dr Marion DownerAder.  Since she is on Eliquis surgery was postponed till 07/29/2018 morning, currently in Buck's traction with supportive care, she remains to be be a moderate to high risk candidate for perioperative complications, this was communicated by me to patient's daughter Velna HatchetSheila who is the primary decision maker.  She agrees and accepts the risk and wants to proceed with surgery thereafter SNF if  needed.  2.  HX of PE in 2017.  Was on Eliquis currently on hold and on DVT prophylactic dose heparin.  Postop heparin to be commenced once orthopedic surgery clears the patient for that.  3.  Chronic diastolic CHF.  EF 60% on echocardiogram in 2018.  Currently compensated.  4.  Essential hypertension.  Home dose beta-blocker continued, PRN IV hydralazine and Lopressor ordered.  5.  Advanced underlying Alzheimer's  dementia with delirium.  Home medications continued PRN Haldol ordered.  Will minimize the use of narcotics and benzodiazepines and try to use Haldol for agitation.  6.  ARF on CKD 3.  Baseline creatinine around 1.2.  Function worsening, gently hydrate, bladder scans suggest good emptying for now, renal function has improved.  We will continue to monitor.   Family Communication  :  daughter  Code Status :  DNR  Disposition Plan  :  SNF  Consults  : Ortho  Procedures  :     DVT Prophylaxis  :   Heparin    Lab Results  Component Value Date   PLT 173 07/28/2018    Diet :  Diet Order            Diet NPO time specified  Diet effective midnight        DIET SOFT Room service appropriate? Yes; Fluid consistency: Thin  Diet effective now               Inpatient Medications Scheduled Meds: . acetaminophen  500 mg Oral Q8H  . busPIRone  15 mg Oral BID  . citalopram  10 mg Oral Daily  . divalproex  125 mg Oral Daily  . heparin  5,000 Units Subcutaneous Q8H  . memantine  14 mg Oral Daily  . metoprolol succinate  50 mg Oral Daily  . risperiDONE  0.25 mg Oral QHS  . sodium chloride flush  3 mL Intravenous Q12H  . Vitamin D (Ergocalciferol)  50,000 Units Oral Q30 days   Continuous Infusions: . [START ON 07/29/2018] lactated ringers     PRN Meds:.bisacodyl, hydrALAZINE, HYDROcodone-acetaminophen, magnesium hydroxide, metoprolol tartrate  Antibiotics  :   Anti-infectives (From admission, onward)   None          Objective:   Vitals:   07/27/18 1948  07/28/18 0200 07/28/18 0345 07/28/18 1122  BP: (!) 129/95  (!) 141/75 (!) 141/75  Pulse: 95  81 81  Resp: 14 16 16    Temp: 100 F (37.8 C)  97.9 F (36.6 C)   TempSrc: Oral  Oral   SpO2: 93%  93%   Weight:      Height:        Wt Readings from Last 3 Encounters:  07/26/18 52.4 kg  05/26/18 51.7 kg  03/24/18 49.6 kg     Intake/Output Summary (Last 24 hours) at 07/28/2018 1125 Last data filed at 07/28/2018 0841 Gross per 24 hour  Intake 1541.68 ml  Output 400 ml  Net 1141.68 ml     Physical Exam  Awake , confused, No new F.N deficits, Normal affect [place].AT,PERRAL Supple Neck,No JVD, No cervical lymphadenopathy appriciated.  Symmetrical Chest wall movement, Good air movement bilaterally, CTAB RRR,No Gallops, Rubs or new Murmurs, No Parasternal Heave +ve B.Sounds, Abd Soft, No tenderness, No organomegaly appriciated, No rebound - guarding or rigidity. No Cyanosis, Clubbing or edema, No new Rash or bruise,  left leg in Buck's traction    Data Review:    CBC Recent Labs  Lab 07/26/18 0440 07/26/18 0948 07/27/18 0508 07/28/18 0241  WBC 9.2 13.1* 10.6* 8.9  HGB 13.7 13.3 12.7 12.4  HCT 46.2* 41.9 40.0 38.7  PLT 227 200 193 173  MCV 94.5 90.5 89.7 89.4  MCH 28.0 28.7 28.5 28.6  MCHC 29.7* 31.7 31.8 32.0  RDW 13.6 13.4 13.7 13.7  LYMPHSABS 1.7  --   --   --   MONOABS 0.8  --   --   --  EOSABS 0.1  --   --   --   BASOSABS 0.0  --   --   --     Chemistries  Recent Labs  Lab 07/26/18 0440 07/26/18 0948 07/27/18 0508 07/28/18 0241  NA 143  --  142 142  K 4.4  --  4.3 3.9  CL 107  --  105 106  CO2 24  --  25 24  GLUCOSE 107*  --  116* 118*  BUN 28*  --  34* 28*  CREATININE 1.44* 1.49* 2.02* 1.73*  CALCIUM 9.4  --  8.9 8.6*  MG  --   --   --  2.0   ------------------------------------------------------------------------------------------------------------------ No results for input(s): CHOL, HDL, LDLCALC, TRIG, CHOLHDL, LDLDIRECT in the last 72  hours.  Lab Results  Component Value Date   HGBA1C 5.6 02/18/2016   ------------------------------------------------------------------------------------------------------------------ No results for input(s): TSH, T4TOTAL, T3FREE, THYROIDAB in the last 72 hours.  Invalid input(s): FREET3 ------------------------------------------------------------------------------------------------------------------ No results for input(s): VITAMINB12, FOLATE, FERRITIN, TIBC, IRON, RETICCTPCT in the last 72 hours.  Coagulation profile Recent Labs  Lab 07/26/18 0440 07/27/18 0508  INR 1.08 1.16    No results for input(s): DDIMER in the last 72 hours.  Cardiac Enzymes No results for input(s): CKMB, TROPONINI, MYOGLOBIN in the last 168 hours.  Invalid input(s): CK ------------------------------------------------------------------------------------------------------------------    Component Value Date/Time   BNP 92.7 02/12/2017 2244    Micro Results Recent Results (from the past 240 hour(s))  MRSA PCR Screening     Status: None   Collection Time: 07/26/18  8:20 AM  Result Value Ref Range Status   MRSA by PCR NEGATIVE NEGATIVE Final    Comment:        The GeneXpert MRSA Assay (FDA approved for NASAL specimens only), is one component of a comprehensive MRSA colonization surveillance program. It is not intended to diagnose MRSA infection nor to guide or monitor treatment for MRSA infections. Performed at [facility], [address], [place], [place] [address]     Radiology Reports Dg Chest 1 View  Result Date: 07/26/2018 CLINICAL DATA:  Post unwitnessed fall. Patient reports left hip pain. EXAM: CHEST  1 VIEW COMPARISON:  Radiograph 10/29/2017 FINDINGS: Patient is rotated. Unchanged cardiomegaly and mediastinal contours allowing for rotation. Aortic atherosclerosis. No pneumothorax or focal airspace disease. No large pleural effusion. The bones are under mineralized. No acute  osseous abnormalities are seen. Bilateral proximal humeral arthroplasties. IMPRESSION: Unchanged cardiomegaly and Aortic Atherosclerosis (ICD10-I70.0). No acute findings. Electronically Signed   By: Narda Rutherford M.D.   On: 07/26/2018 06:15   Ct Head Wo Contrast  Result Date: 07/26/2018 CLINICAL DATA:  Post unwitnessed fall. Head trauma, minor, pt on anticoagulation; C-spine trauma, high clinical risk (NEXUS/CCR) EXAM: CT HEAD WITHOUT CONTRAST CT CERVICAL SPINE WITHOUT CONTRAST TECHNIQUE: Multidetector CT imaging of the head and cervical spine was performed following the standard protocol without intravenous contrast. Multiplanar CT image reconstructions of the cervical spine were also generated. COMPARISON:  Head CT 10/29/2017. Head and cervical spine CT 09/23/2017 FINDINGS: Despite best efforts by technologist, patient motion artifact due to medical condition. CT HEAD FINDINGS Brain: Motion artifact limits assessment. Generalized atrophy and chronic small vessel ischemia. Remote infarct in the right parietal lobe. Remote lacunar infarct in the right basal ganglia. No intracranial hemorrhage, mass effect, or midline shift. No hydrocephalus. The basilar cisterns are patent. No evidence of territorial infarct or acute ischemia. Calcified meningioma versus prominent hyperextends cyst in the right frontal region,  unchanged from previous. Possible calcified dural-based meningioma in the left parietal region, also unchanged. Vascular: Atherosclerosis of skullbase vasculature without hyperdense vessel or abnormal calcification. Skull: No skull fracture. Prominent right frontal hyperostosis versus partially calcified meningioma, unchanged from priors. Sinuses/Orbits: No acute findings. Bilateral cataract resection. Other: None. CT CERVICAL SPINE FINDINGS Alignment: Prominent dextroscoliotic curvature of the cervical spine, slightly progressed from prior exam. Bony fusion of C3-C4 and to lesser extent C4-C5 with  straightening of normal lordosis. Chronic anterolisthesis of C7 on T1. No traumatic subluxation. Skull base and vertebrae: Motion limits assessment for acute fracture. Allowing for this, no acute fractures seen. Chronic bony ankylosis of C3-C4, vertebral bodies and posterior elements, partial bony ankylosis of C4-C5 vertebral bodies. This is unchanged. The dens is intact. No gross skull base fracture. Soft tissues and spinal canal: No prevertebral fluid or swelling. No visible canal hematoma. Disc levels: Complete disc space loss at C3-C4 with bony ankylosis. Partial ankylosis of C4-C5 with disc space loss. Additional disc space narrowing and endplate spurring throughout the cervical spine. Upper chest: Biapical pleuroparenchymal scarring. Probable enlarged thyroid gland again seen. Other: None. IMPRESSION: 1. Motion limits detailed evaluation, particularly through the cervical spine. 2. No acute intracranial abnormality. No skull fracture. 3. Generalized atrophy and chronic small vessel ischemia. Remote right parietal infarct. 4. Prominent multilevel degenerative change throughout the cervical spine without evidence of acute fracture or subluxation. Electronically Signed   By: Narda Rutherford M.D.   On: 07/26/2018 06:31   Ct Cervical Spine Wo Contrast  Result Date: 07/26/2018 CLINICAL DATA:  Post unwitnessed fall. Head trauma, minor, pt on anticoagulation; C-spine trauma, high clinical risk (NEXUS/CCR) EXAM: CT HEAD WITHOUT CONTRAST CT CERVICAL SPINE WITHOUT CONTRAST TECHNIQUE: Multidetector CT imaging of the head and cervical spine was performed following the standard protocol without intravenous contrast. Multiplanar CT image reconstructions of the cervical spine were also generated. COMPARISON:  Head CT 10/29/2017. Head and cervical spine CT 09/23/2017 FINDINGS: Despite best efforts by technologist, patient motion artifact due to medical condition. CT HEAD FINDINGS Brain: Motion artifact limits assessment.  Generalized atrophy and chronic small vessel ischemia. Remote infarct in the right parietal lobe. Remote lacunar infarct in the right basal ganglia. No intracranial hemorrhage, mass effect, or midline shift. No hydrocephalus. The basilar cisterns are patent. No evidence of territorial infarct or acute ischemia. Calcified meningioma versus prominent hyperextends cyst in the right frontal region, unchanged from previous. Possible calcified dural-based meningioma in the left parietal region, also unchanged. Vascular: Atherosclerosis of skullbase vasculature without hyperdense vessel or abnormal calcification. Skull: No skull fracture. Prominent right frontal hyperostosis versus partially calcified meningioma, unchanged from priors. Sinuses/Orbits: No acute findings. Bilateral cataract resection. Other: None. CT CERVICAL SPINE FINDINGS Alignment: Prominent dextroscoliotic curvature of the cervical spine, slightly progressed from prior exam. Bony fusion of C3-C4 and to lesser extent C4-C5 with straightening of normal lordosis. Chronic anterolisthesis of C7 on T1. No traumatic subluxation. Skull base and vertebrae: Motion limits assessment for acute fracture. Allowing for this, no acute fractures seen. Chronic bony ankylosis of C3-C4, vertebral bodies and posterior elements, partial bony ankylosis of C4-C5 vertebral bodies. This is unchanged. The dens is intact. No gross skull base fracture. Soft tissues and spinal canal: No prevertebral fluid or swelling. No visible canal hematoma. Disc levels: Complete disc space loss at C3-C4 with bony ankylosis. Partial ankylosis of C4-C5 with disc space loss. Additional disc space narrowing and endplate spurring throughout the cervical spine. Upper chest: Biapical pleuroparenchymal scarring. Probable enlarged thyroid gland  again seen. Other: None. IMPRESSION: 1. Motion limits detailed evaluation, particularly through the cervical spine. 2. No acute intracranial abnormality. No skull  fracture. 3. Generalized atrophy and chronic small vessel ischemia. Remote right parietal infarct. 4. Prominent multilevel degenerative change throughout the cervical spine without evidence of acute fracture or subluxation. Electronically Signed   By: Narda Rutherford M.D.   On: 07/26/2018 06:31   Dg Hip Unilat W Or Wo Pelvis 2-3 Views Left  Result Date: 07/26/2018 CLINICAL DATA:  Left hip pain after unwitnessed fall. EXAM: DG HIP (WITH OR WITHOUT PELVIS) 2-3V LEFT COMPARISON:  Pelvis radiograph 02/15/2017 FINDINGS: Displaced left femoral neck fracture with proximal migration of the femoral shaft. Mild apex anterior angulation. Femoral head remains seated. Pubic rami are intact. No additional fracture of the pelvis. Calcifications in the pelvis may be calcified fibroids, unchanged from pelvic radiograph 02/15/2017. IMPRESSION: Displaced left femoral neck fracture with proximal migration of the femoral shaft. Electronically Signed   By: Narda Rutherford M.D.   On: 07/26/2018 06:16    Time Spent in minutes  30   Susa Raring M.D on 07/28/2018 at 11:25 AM  To page go to [email or web address] - password [facility]

## 2018-07-28 NOTE — Progress Notes (Signed)
Subjective:   Procedure(s) (LRB): LEFT HEMI HIP ARTHROPLASTY (Left) Patient uncommunicative-seems comfortable   Objective: Vital signs in last 24 hours: Temp:  [97.9 F (36.6 C)-100.4 F (38 C)] 97.9 F (36.6 C) (12/16 0345) Pulse Rate:  [81-95] 81 (12/16 0345) Resp:  [14-16] 16 (12/16 0345) BP: (129-154)/(75-95) 141/75 (12/16 0345) SpO2:  [93 %-94 %] 93 % (12/16 0345)  Intake/Output from previous day: 12/15 0701 - 12/16 0700 In: 1301.7 [P.O.:320; I.V.:981.7] Out: 400 [Urine:400] Intake/Output this shift: No intake/output data recorded.  Recent Labs    07/26/18 0440 07/26/18 0948 07/27/18 0508 07/28/18 0241  HGB 13.7 13.3 12.7 12.4   Recent Labs    07/27/18 0508 07/28/18 0241  WBC 10.6* 8.9  RBC 4.46 4.33  HCT 40.0 38.7  PLT 193 173   Recent Labs    07/27/18 0508 07/28/18 0241  NA 142 142  K 4.3 3.9  CL 105 106  CO2 25 24  BUN 34* 28*  CREATININE 2.02* 1.73*  GLUCOSE 116* 118*  CALCIUM 8.9 8.6*   Recent Labs    07/26/18 0440 07/27/18 0508  INR 1.08 1.16    No cellulitis present Compartment soft  In Buck's traction  Assessment/Plan:   Procedure(s) (LRB): LEFT HEMI HIP ARTHROPLASTY (Left) Will plan hemiarthroplasty tomorrow    [name] 07/28/2018, 7:18 AM

## 2018-07-29 ENCOUNTER — Inpatient Hospital Stay (HOSPITAL_COMMUNITY): Payer: Medicare Other

## 2018-07-29 ENCOUNTER — Inpatient Hospital Stay (HOSPITAL_COMMUNITY): Payer: Medicare Other | Admitting: Certified Registered Nurse Anesthetist

## 2018-07-29 ENCOUNTER — Encounter (HOSPITAL_COMMUNITY): Payer: Self-pay | Admitting: Certified Registered Nurse Anesthetist

## 2018-07-29 ENCOUNTER — Encounter (HOSPITAL_COMMUNITY)
Admission: EM | Disposition: A | Discharge status: Skilled Nursing Facility | Payer: Self-pay | Source: Home / Self Care | Attending: Internal Medicine

## 2018-07-29 DIAGNOSIS — S72012A Unspecified intracapsular fracture of left femur, initial encounter for closed fracture: Secondary | ICD-10-CM

## 2018-07-29 HISTORY — PX: HIP ARTHROPLASTY: SHX981

## 2018-07-29 LAB — BASIC METABOLIC PANEL
Anion gap: 11 (ref 5–15)
BUN: 19 mg/dL (ref 8–23)
CO2: 24 mmol/L (ref 22–32)
Calcium: 8.6 mg/dL — ABNORMAL LOW (ref 8.9–10.3)
Chloride: 104 mmol/L (ref 98–111)
Creatinine, Ser: 1.54 mg/dL — ABNORMAL HIGH (ref 0.44–1.00)
GFR calc Af Amer: 34 mL/min — ABNORMAL LOW (ref >60)
GFR calc non Af Amer: 29 mL/min — ABNORMAL LOW (ref >60)
Glucose, Bld: 107 mg/dL — ABNORMAL HIGH (ref 70–99)
Potassium: 3.9 mmol/L (ref 3.5–5.1)
Sodium: 139 mmol/L (ref 135–145)

## 2018-07-29 LAB — MAGNESIUM: Magnesium: 1.8 mg/dL (ref 1.7–2.4)

## 2018-07-29 LAB — CBC
HCT: 40.7 % (ref 36.0–46.0)
Hemoglobin: 12.7 g/dL (ref 12.0–15.0)
MCH: 28.2 pg (ref 26.0–34.0)
MCHC: 31.2 g/dL (ref 30.0–36.0)
MCV: 90.4 fL (ref 80.0–100.0)
Platelets: 178 10*3/uL (ref 150–400)
RBC: 4.5 MIL/uL (ref 3.87–5.11)
RDW: 13.4 % (ref 11.5–15.5)
WBC: 8.9 10*3/uL (ref 4.0–10.5)
nRBC: 0 % (ref 0.0–0.2)

## 2018-07-29 SURGERY — HEMIARTHROPLASTY, HIP, DIRECT ANTERIOR APPROACH, FOR FRACTURE
Anesthesia: General | Site: Hip | Laterality: Left

## 2018-07-29 MED ORDER — SODIUM CHLORIDE 0.9 % IV SOLN
INTRAVENOUS | Status: DC | PRN
  Administered 2018-07-29: 20 ug/min via INTRAVENOUS

## 2018-07-29 MED ORDER — ROCURONIUM BROMIDE 50 MG/5ML IV SOSY
PREFILLED_SYRINGE | INTRAVENOUS | Status: AC
  Filled 2018-07-29: qty 5

## 2018-07-29 MED ORDER — ONDANSETRON HCL 4 MG/2ML IJ SOLN
INTRAMUSCULAR | Status: AC
  Filled 2018-07-29: qty 2

## 2018-07-29 MED ORDER — METOCLOPRAMIDE HCL 5 MG/ML IJ SOLN: 5.0000 mg | Freq: Three times a day (TID) | INTRAMUSCULAR | Status: DC | PRN

## 2018-07-29 MED ORDER — DEXAMETHASONE SODIUM PHOSPHATE 10 MG/ML IJ SOLN
INTRAMUSCULAR | Status: AC
  Filled 2018-07-29: qty 1

## 2018-07-29 MED ORDER — CEFAZOLIN SODIUM 1 G IJ SOLR
INTRAMUSCULAR | Status: AC
  Filled 2018-07-29: qty 20

## 2018-07-29 MED ORDER — ONDANSETRON HCL 4 MG/2ML IJ SOLN: 4.0000 mg | Freq: Four times a day (QID) | INTRAMUSCULAR | Status: DC | PRN

## 2018-07-29 MED ORDER — ONDANSETRON HCL 4 MG/2ML IJ SOLN: 4.0000 mg | Freq: Once | INTRAMUSCULAR | Status: DC | PRN

## 2018-07-29 MED ORDER — CEFAZOLIN SODIUM-DEXTROSE 1-4 GM/50ML-% IV SOLN
1.0000 g | Freq: Two times a day (BID) | INTRAVENOUS | Status: AC
  Administered 2018-07-29: 1 g via INTRAVENOUS
  Filled 2018-07-29: qty 50

## 2018-07-29 MED ORDER — DEXAMETHASONE SODIUM PHOSPHATE 10 MG/ML IJ SOLN
INTRAMUSCULAR | Status: DC | PRN
  Administered 2018-07-29: 10 mg via INTRAVENOUS

## 2018-07-29 MED ORDER — PHENYLEPHRINE 40 MCG/ML (10ML) SYRINGE FOR IV PUSH (FOR BLOOD PRESSURE SUPPORT)
PREFILLED_SYRINGE | INTRAVENOUS | Status: DC | PRN
  Administered 2018-07-29: 40 ug via INTRAVENOUS
  Administered 2018-07-29: 80 ug via INTRAVENOUS
  Administered 2018-07-29: 120 ug via INTRAVENOUS

## 2018-07-29 MED ORDER — FENTANYL CITRATE (PF) 100 MCG/2ML IJ SOLN: 25.0000 ug | INTRAMUSCULAR | Status: DC | PRN

## 2018-07-29 MED ORDER — ONDANSETRON HCL 4 MG/2ML IJ SOLN
INTRAMUSCULAR | Status: DC | PRN
  Administered 2018-07-29: 4 mg via INTRAVENOUS

## 2018-07-29 MED ORDER — MEPERIDINE HCL 50 MG/ML IJ SOLN: 6.2500 mg | INTRAMUSCULAR | Status: DC | PRN

## 2018-07-29 MED ORDER — BUPIVACAINE HCL (PF) 0.25 % IJ SOLN
INTRAMUSCULAR | Status: DC | PRN
  Administered 2018-07-29: 30 mL

## 2018-07-29 MED ORDER — METOCLOPRAMIDE HCL 5 MG PO TABS: 5.0000 mg | ORAL_TABLET | Freq: Three times a day (TID) | ORAL | Status: DC | PRN

## 2018-07-29 MED ORDER — PHENOL 1.4 % MT LIQD: 1.0000 | OROMUCOSAL | Status: DC | PRN

## 2018-07-29 MED ORDER — FENTANYL CITRATE (PF) 250 MCG/5ML IJ SOLN
INTRAMUSCULAR | Status: AC
  Filled 2018-07-29: qty 5

## 2018-07-29 MED ORDER — SODIUM CHLORIDE 0.9 % IR SOLN
Status: DC | PRN
  Administered 2018-07-29: 1000 mL

## 2018-07-29 MED ORDER — ONDANSETRON HCL 4 MG PO TABS: 4.0000 mg | ORAL_TABLET | Freq: Four times a day (QID) | ORAL | Status: DC | PRN

## 2018-07-29 MED ORDER — DOCUSATE SODIUM 100 MG PO CAPS
100.0000 mg | ORAL_CAPSULE | Freq: Two times a day (BID) | ORAL | Status: DC
  Administered 2018-07-29 – 2018-07-31 (×4): 100 mg via ORAL
  Filled 2018-07-29 (×4): qty 1

## 2018-07-29 MED ORDER — ACETAMINOPHEN 10 MG/ML IV SOLN
INTRAVENOUS | Status: DC | PRN
  Administered 2018-07-29: 1000 mg via INTRAVENOUS

## 2018-07-29 MED ORDER — SUGAMMADEX SODIUM 200 MG/2ML IV SOLN
INTRAVENOUS | Status: DC | PRN
  Administered 2018-07-29: 200 mg via INTRAVENOUS

## 2018-07-29 MED ORDER — LIDOCAINE 2% (20 MG/ML) 5 ML SYRINGE
INTRAMUSCULAR | Status: AC
  Filled 2018-07-29: qty 5

## 2018-07-29 MED ORDER — PHENYLEPHRINE 40 MCG/ML (10ML) SYRINGE FOR IV PUSH (FOR BLOOD PRESSURE SUPPORT)
PREFILLED_SYRINGE | INTRAVENOUS | Status: AC
  Filled 2018-07-29: qty 10

## 2018-07-29 MED ORDER — MENTHOL 3 MG MT LOZG: 1.0000 | LOZENGE | OROMUCOSAL | Status: DC | PRN

## 2018-07-29 MED ORDER — ROCURONIUM BROMIDE 50 MG/5ML IV SOSY
PREFILLED_SYRINGE | INTRAVENOUS | Status: DC | PRN
  Administered 2018-07-29: 40 mg via INTRAVENOUS

## 2018-07-29 MED ORDER — ACETAMINOPHEN 10 MG/ML IV SOLN
INTRAVENOUS | Status: AC
  Filled 2018-07-29: qty 100

## 2018-07-29 MED ORDER — LIDOCAINE 2% (20 MG/ML) 5 ML SYRINGE
INTRAMUSCULAR | Status: DC | PRN
  Administered 2018-07-29: 70 mg via INTRAVENOUS

## 2018-07-29 MED ORDER — BUPIVACAINE HCL (PF) 0.25 % IJ SOLN
INTRAMUSCULAR | Status: AC
  Filled 2018-07-29: qty 30

## 2018-07-29 MED ORDER — CEFAZOLIN SODIUM-DEXTROSE 2-3 GM-%(50ML) IV SOLR
INTRAVENOUS | Status: DC | PRN
  Administered 2018-07-29: 2 g via INTRAVENOUS

## 2018-07-29 MED ORDER — FENTANYL CITRATE (PF) 100 MCG/2ML IJ SOLN
INTRAMUSCULAR | Status: DC | PRN
  Administered 2018-07-29 (×2): 50 ug via INTRAVENOUS
  Administered 2018-07-29 (×2): 25 ug via INTRAVENOUS

## 2018-07-29 MED ORDER — PROPOFOL 10 MG/ML IV BOLUS
INTRAVENOUS | Status: DC | PRN
  Administered 2018-07-29: 140 mg via INTRAVENOUS

## 2018-07-29 SURGICAL SUPPLY — 54 items
BIPOLAR PROS AML 44 (Hips) ×2 IMPLANT
BLADE SAW SGTL 73X25 THK (BLADE) ×2 IMPLANT
COVER SURGICAL LIGHT HANDLE (MISCELLANEOUS) ×2 IMPLANT
COVER WAND RF STERILE (DRAPES) ×2 IMPLANT
DRAPE IMP U-DRAPE 54X76 (DRAPES) ×2 IMPLANT
DRAPE INCISE IOBAN 66X45 STRL (DRAPES) IMPLANT
DRAPE ORTHO SPLIT 77X108 STRL (DRAPES) ×2
DRAPE SURG ORHT 6 SPLT 77X108 (DRAPES) ×2 IMPLANT
DRESSING ALLEVYN LIFE SACRUM (GAUZE/BANDAGES/DRESSINGS) ×2 IMPLANT
DRSG ADAPTIC 3X8 NADH LF (GAUZE/BANDAGES/DRESSINGS) ×2 IMPLANT
DRSG MEPILEX BORDER 4X8 (GAUZE/BANDAGES/DRESSINGS) ×2 IMPLANT
DURAPREP 26ML APPLICATOR (WOUND CARE) ×2 IMPLANT
ELECT BLADE 4.0 EZ CLEAN MEGAD (MISCELLANEOUS) ×2
ELECT BLADE 6.5 EXT (BLADE) IMPLANT
ELECT REM PT RETURN 9FT ADLT (ELECTROSURGICAL) ×2
ELECTRODE BLDE 4.0 EZ CLN MEGD (MISCELLANEOUS) ×1 IMPLANT
ELECTRODE REM PT RTRN 9FT ADLT (ELECTROSURGICAL) ×1 IMPLANT
FACESHIELD WRAPAROUND (MASK) ×4 IMPLANT
GLOVE BIOGEL PI IND STRL 8 (GLOVE) ×1 IMPLANT
GLOVE BIOGEL PI IND STRL 8.5 (GLOVE) IMPLANT
GLOVE BIOGEL PI INDICATOR 8 (GLOVE) ×1
GLOVE BIOGEL PI INDICATOR 8.5 (GLOVE)
GLOVE ECLIPSE 8.0 STRL XLNG CF (GLOVE) ×2 IMPLANT
GLOVE ECLIPSE 8.5 STRL (GLOVE) ×2 IMPLANT
GOWN STRL REUS W/ TWL LRG LVL3 (GOWN DISPOSABLE) ×2 IMPLANT
GOWN STRL REUS W/ TWL XL LVL3 (GOWN DISPOSABLE) ×1 IMPLANT
GOWN STRL REUS W/TWL 2XL LVL3 (GOWN DISPOSABLE) IMPLANT
GOWN STRL REUS W/TWL LRG LVL3 (GOWN DISPOSABLE) ×2
GOWN STRL REUS W/TWL XL LVL3 (GOWN DISPOSABLE) ×1
HEAD BIPOLAR PROS AML 44 (Hips) ×1 IMPLANT
HEAD FEM STD 28X+1.5 STRL (Hips) ×2 IMPLANT
IMMOBILIZER KNEE 22 UNIV (SOFTGOODS) IMPLANT
KIT BASIN OR (CUSTOM PROCEDURE TRAY) ×2 IMPLANT
KIT TURNOVER KIT B (KITS) ×2 IMPLANT
MANIFOLD NEPTUNE II (INSTRUMENTS) ×2 IMPLANT
NDL SUT .5 MAYO 1.404X.05X (NEEDLE) ×1 IMPLANT
NEEDLE MAYO TAPER (NEEDLE) ×1
NS IRRIG 1000ML POUR BTL (IV SOLUTION) ×2 IMPLANT
PACK TOTAL JOINT (CUSTOM PROCEDURE TRAY) ×2 IMPLANT
PACK UNIVERSAL I (CUSTOM PROCEDURE TRAY) ×2 IMPLANT
PAD ARMBOARD 7.5X6 YLW CONV (MISCELLANEOUS) ×4 IMPLANT
PASSER SUT SWANSON 36MM LOOP (INSTRUMENTS) IMPLANT
STAPLER VISISTAT 35W (STAPLE) ×4 IMPLANT
STEM BASIC PRESS FIT SZ2 (Stem) ×2 IMPLANT
SUT ETHIBOND CT1 BRD #0 30IN (SUTURE) ×4 IMPLANT
SUT ETHIBOND NAB CT1 #1 30IN (SUTURE) ×8 IMPLANT
SUT VIC AB 0 CT1 27 (SUTURE) ×3
SUT VIC AB 0 CT1 27XBRD ANBCTR (SUTURE) ×3 IMPLANT
SUT VIC AB 2-0 CTB1 (SUTURE) IMPLANT
SYR 20CC LL (SYRINGE) IMPLANT
TOWEL OR 17X24 6PK STRL BLUE (TOWEL DISPOSABLE) ×2 IMPLANT
TOWEL OR 17X26 10 PK STRL BLUE (TOWEL DISPOSABLE) ×2 IMPLANT
TRAY FOLEY CATH 14FR (SET/KITS/TRAYS/PACK) ×2 IMPLANT
WATER STERILE IRR 1000ML POUR (IV SOLUTION) ×8 IMPLANT

## 2018-07-29 NOTE — Anesthesia Preprocedure Evaluation (Addendum)
Anesthesia Evaluation  Patient identified by MRN, date of birth, ID band Patient awake and Patient confused    Reviewed: Allergy & Precautions, NPO status , Patient's Chart, lab work & pertinent test results  Airway Mallampati: III  TM Distance: >3 FB Neck ROM: Full    Dental  (+) Dental Advisory Given, Edentulous Upper, Edentulous Lower   Pulmonary COPD,    Pulmonary exam normal breath sounds clear to auscultation       Cardiovascular hypertension, Pt. on home beta blockers and Pt. on medications +CHF  Normal cardiovascular exam Rhythm:Regular Rate:Normal     Neuro/Psych PSYCHIATRIC DISORDERS Anxiety Depression Schizophrenia Dementia CVA    GI/Hepatic Dysphagia   Endo/Other  negative endocrine ROS  Renal/GU Renal disease  negative genitourinary   Musculoskeletal Left subcapsular hip Fx Scoliosis   Abdominal   Peds  Hematology  (+) anemia , Eliquis therapy- last dose 12/13   Anesthesia Other Findings   Reproductive/Obstetrics                            Anesthesia Physical Anesthesia Plan  ASA: III  Anesthesia Plan: General   Post-op Pain Management:    Induction: Intravenous  PONV Risk Score and Plan: 4 or greater and Ondansetron, Treatment may vary due to age or medical condition and Diphenhydramine  Airway Management Planned: Oral ETT  Additional Equipment:   Intra-op Plan:   Post-operative Plan: Extubation in OR  Informed Consent: I have reviewed the patients History and Physical, chart, labs and discussed the procedure including the risks, benefits and alternatives for the proposed anesthesia with the patient or authorized representative who has indicated his/her understanding and acceptance.   Dental advisory given  Plan Discussed with: CRNA and Surgeon  Anesthesia Plan Comments:         Anesthesia Quick Evaluation

## 2018-07-29 NOTE — Progress Notes (Signed)
PROGRESS NOTE                                                                                                                                                                                                             Patient Demographics:    Roberta Griffin, is a 84 y.o. female, DOB - 1926-05-22, WUJ:[id number]  Admit date - 07/26/2018   Admitting Physician Leroy Sea, MD  Outpatient Primary MD for the patient is Patient, No Pcp Per  LOS - 3  Chief Complaint  Patient presents with  . Fall       Brief Narrative   Roberta Griffin  is a 84 y.o. female, history of PE in 2017, multinodular goiter, CKD 3 baseline creatinine around 1.2, severe underlying dementia lives in a memory care unit, history of CVA, chronic diastolic CHF with EF 60% on recent echocardiogram done in 2018.  Patient originally from [place] moved to this area in a memory care unit to be close to her family in 2017 brought in after a mechanical fall after which she had left-sided leg pain.  In the ER found to have femoral shaft fracture and admitted to the hospital under my service.  Orthopedics was consulted by the ER.  As patient takes Eliquis urgent surgery could not be done.  Patient unable to provide any history due to advanced dementia.  History obtained by daughter over the phone.   Subjective:   Patient in bed, confused but denies headache, no chest - abd pain, no SOB.   Assessment  & Plan :     1.  Mechanical fall with left femoral shaft fracture. Consulted orthopedics after admission discussed with Dr Marion Downer.  Since she is on Eliquis surgery was postponed later today on 07/29/2018, currently in Buck's traction with supportive care, she remains to be be a moderate to high risk candidate for perioperative complications, this was communicated by me to patient's daughter Velna Hatchet who is the primary decision maker.  She agrees and accepts the risk and wants to proceed with surgery thereafter SNF if  needed.  2.  HX of PE in 2017.  Was on Eliquis currently on hold and on DVT prophylactic dose heparin.  Postop heparin to be commenced once orthopedic surgery clears the patient for that.  3.  Chronic diastolic CHF.  EF 60% on echocardiogram in 2018.  Currently compensated.  4.  Essential hypertension.  Home dose beta-blocker continued, PRN IV hydralazine and Lopressor ordered.  5.  Advanced  underlying Alzheimer's dementia with delirium.  Home medications continued PRN Haldol ordered.  Will minimize the use of narcotics and benzodiazepines and try to use Haldol for agitation.  6.  ARF on CKD 3.  Baseline creatinine around 1.2.  Function worsening, gently hydrate, bladder scans suggest good emptying for now, renal function has improved.  We will continue to monitor.   Family Communication  :  daughter  Code Status :  DNR  Disposition Plan  :  SNF  Consults  : Ortho  Procedures  :     DVT Prophylaxis  :   Heparin    Lab Results  Component Value Date   PLT 178 07/29/2018    Diet :  Diet Order            Diet NPO time specified  Diet effective midnight               Inpatient Medications Scheduled Meds: . acetaminophen  500 mg Oral Q8H  . busPIRone  15 mg Oral BID  . citalopram  10 mg Oral Daily  . divalproex  125 mg Oral Daily  . heparin  5,000 Units Subcutaneous Q8H  . memantine  14 mg Oral Daily  . metoprolol succinate  50 mg Oral Daily  . risperiDONE  0.25 mg Oral QHS  . sodium chloride flush  3 mL Intravenous Q12H  . Vitamin D (Ergocalciferol)  50,000 Units Oral Q30 days   Continuous Infusions: . lactated ringers 50 mL/hr at 07/28/18 1611   PRN Meds:.bisacodyl, hydrALAZINE, HYDROcodone-acetaminophen, magnesium hydroxide, metoprolol tartrate  Antibiotics  :   Anti-infectives (From admission, onward)   None          Objective:   Vitals:   07/28/18 1122 07/28/18 1414 07/28/18 2010 07/29/18 0646  BP: (!) 141/75 (!) 150/74 (!) 145/53 (!) 159/78   Pulse: 81 71 75 75  Resp:  16    Temp:  98.1 F (36.7 C) 98.3 F (36.8 C) 98.9 F (37.2 C)  TempSrc:  Oral Oral Oral  SpO2:   96% 96%  Weight:      Height:        Wt Readings from Last 3 Encounters:  07/26/18 52.4 kg  05/26/18 51.7 kg  03/24/18 49.6 kg     Intake/Output Summary (Last 24 hours) at 07/29/2018 0904 Last data filed at 07/29/2018 0645 Gross per 24 hour  Intake 326.58 ml  Output 1200 ml  Net -873.42 ml     Physical Exam  Awake Alert, Oriented X 3, No new F.N deficits, Normal affect Oval.AT,PERRAL Supple Neck,No JVD, No cervical lymphadenopathy appriciated.  Symmetrical Chest wall movement, Good air movement bilaterally, CTAB RRR,No Gallops, Rubs or new Murmurs, No Parasternal Heave +ve B.Sounds, Abd Soft, No tenderness, No organomegaly appriciated, No rebound - guarding or rigidity. No Cyanosis, Clubbing or edema,  left leg in Buck's traction    Data Review:    CBC Recent Labs  Lab 07/26/18 0440 07/26/18 0948 07/27/18 0508 07/28/18 0241 07/29/18 0331  WBC 9.2 13.1* 10.6* 8.9 8.9  HGB 13.7 13.3 12.7 12.4 12.7  HCT 46.2* 41.9 40.0 38.7 40.7  PLT 227 200 193 173 178  MCV 94.5 90.5 89.7 89.4 90.4  MCH 28.0 28.7 28.5 28.6 28.2  MCHC 29.7* 31.7 31.8 32.0 31.2  RDW 13.6 13.4 13.7 13.7 13.4  LYMPHSABS 1.7  --   --   --   --   MONOABS 0.8  --   --   --   --  EOSABS 0.1  --   --   --   --   BASOSABS 0.0  --   --   --   --     Chemistries  Recent Labs  Lab 07/26/18 0440 07/26/18 0948 07/27/18 0508 07/28/18 0241 07/29/18 0331  NA 143  --  142 142 139  K 4.4  --  4.3 3.9 3.9  CL 107  --  105 106 104  CO2 24  --  25 24 24   GLUCOSE 107*  --  116* 118* 107*  BUN 28*  --  34* 28* 19  CREATININE 1.44* 1.49* 2.02* 1.73* 1.54*  CALCIUM 9.4  --  8.9 8.6* 8.6*  MG  --   --   --  2.0 1.8   ------------------------------------------------------------------------------------------------------------------ No results for input(s): CHOL, HDL,  LDLCALC, TRIG, CHOLHDL, LDLDIRECT in the last 72 hours.  Lab Results  Component Value Date   HGBA1C 5.6 02/18/2016   ------------------------------------------------------------------------------------------------------------------ No results for input(s): TSH, T4TOTAL, T3FREE, THYROIDAB in the last 72 hours.  Invalid input(s): FREET3 ------------------------------------------------------------------------------------------------------------------ No results for input(s): VITAMINB12, FOLATE, FERRITIN, TIBC, IRON, RETICCTPCT in the last 72 hours.  Coagulation profile Recent Labs  Lab 07/26/18 0440 07/27/18 0508  INR 1.08 1.16    No results for input(s): DDIMER in the last 72 hours.  Cardiac Enzymes No results for input(s): CKMB, TROPONINI, MYOGLOBIN in the last 168 hours.  Invalid input(s): CK ------------------------------------------------------------------------------------------------------------------    Component Value Date/Time   BNP 92.7 02/12/2017 2244    Micro Results Recent Results (from the past 240 hour(s))  MRSA PCR Screening     Status: None   Collection Time: 07/26/18  8:20 AM  Result Value Ref Range Status   MRSA by PCR NEGATIVE NEGATIVE Final    Comment:        The GeneXpert MRSA Assay (FDA approved for NASAL specimens only), is one component of a comprehensive MRSA colonization surveillance program. It is not intended to diagnose MRSA infection nor to guide or monitor treatment for MRSA infections. Performed at [facility], [address], [place], [place] [address]     Radiology Reports Dg Chest 1 View  Result Date: 07/26/2018 CLINICAL DATA:  Post unwitnessed fall. Patient reports left hip pain. EXAM: CHEST  1 VIEW COMPARISON:  Radiograph 10/29/2017 FINDINGS: Patient is rotated. Unchanged cardiomegaly and mediastinal contours allowing for rotation. Aortic atherosclerosis. No pneumothorax or focal airspace disease. No large pleural  effusion. The bones are under mineralized. No acute osseous abnormalities are seen. Bilateral proximal humeral arthroplasties. IMPRESSION: Unchanged cardiomegaly and Aortic Atherosclerosis (ICD10-I70.0). No acute findings. Electronically Signed   By: Narda Rutherford M.D.   On: 07/26/2018 06:15   Ct Head Wo Contrast  Result Date: 07/26/2018 CLINICAL DATA:  Post unwitnessed fall. Head trauma, minor, pt on anticoagulation; C-spine trauma, high clinical risk (NEXUS/CCR) EXAM: CT HEAD WITHOUT CONTRAST CT CERVICAL SPINE WITHOUT CONTRAST TECHNIQUE: Multidetector CT imaging of the head and cervical spine was performed following the standard protocol without intravenous contrast. Multiplanar CT image reconstructions of the cervical spine were also generated. COMPARISON:  Head CT 10/29/2017. Head and cervical spine CT 09/23/2017 FINDINGS: Despite best efforts by technologist, patient motion artifact due to medical condition. CT HEAD FINDINGS Brain: Motion artifact limits assessment. Generalized atrophy and chronic small vessel ischemia. Remote infarct in the right parietal lobe. Remote lacunar infarct in the right basal ganglia. No intracranial hemorrhage, mass effect, or midline shift. No hydrocephalus. The basilar cisterns are patent. No evidence  of territorial infarct or acute ischemia. Calcified meningioma versus prominent hyperextends cyst in the right frontal region, unchanged from previous. Possible calcified dural-based meningioma in the left parietal region, also unchanged. Vascular: Atherosclerosis of skullbase vasculature without hyperdense vessel or abnormal calcification. Skull: No skull fracture. Prominent right frontal hyperostosis versus partially calcified meningioma, unchanged from priors. Sinuses/Orbits: No acute findings. Bilateral cataract resection. Other: None. CT CERVICAL SPINE FINDINGS Alignment: Prominent dextroscoliotic curvature of the cervical spine, slightly progressed from prior exam. Bony  fusion of C3-C4 and to lesser extent C4-C5 with straightening of normal lordosis. Chronic anterolisthesis of C7 on T1. No traumatic subluxation. Skull base and vertebrae: Motion limits assessment for acute fracture. Allowing for this, no acute fractures seen. Chronic bony ankylosis of C3-C4, vertebral bodies and posterior elements, partial bony ankylosis of C4-C5 vertebral bodies. This is unchanged. The dens is intact. No gross skull base fracture. Soft tissues and spinal canal: No prevertebral fluid or swelling. No visible canal hematoma. Disc levels: Complete disc space loss at C3-C4 with bony ankylosis. Partial ankylosis of C4-C5 with disc space loss. Additional disc space narrowing and endplate spurring throughout the cervical spine. Upper chest: Biapical pleuroparenchymal scarring. Probable enlarged thyroid gland again seen. Other: None. IMPRESSION: 1. Motion limits detailed evaluation, particularly through the cervical spine. 2. No acute intracranial abnormality. No skull fracture. 3. Generalized atrophy and chronic small vessel ischemia. Remote right parietal infarct. 4. Prominent multilevel degenerative change throughout the cervical spine without evidence of acute fracture or subluxation. Electronically Signed   By: Narda RutherfordMelanie  Sanford M.D.   On: 07/26/2018 06:31   Ct Cervical Spine Wo Contrast  Result Date: 07/26/2018 CLINICAL DATA:  Post unwitnessed fall. Head trauma, minor, pt on anticoagulation; C-spine trauma, high clinical risk (NEXUS/CCR) EXAM: CT HEAD WITHOUT CONTRAST CT CERVICAL SPINE WITHOUT CONTRAST TECHNIQUE: Multidetector CT imaging of the head and cervical spine was performed following the standard protocol without intravenous contrast. Multiplanar CT image reconstructions of the cervical spine were also generated. COMPARISON:  Head CT 10/29/2017. Head and cervical spine CT 09/23/2017 FINDINGS: Despite best efforts by technologist, patient motion artifact due to medical condition. CT HEAD  FINDINGS Brain: Motion artifact limits assessment. Generalized atrophy and chronic small vessel ischemia. Remote infarct in the right parietal lobe. Remote lacunar infarct in the right basal ganglia. No intracranial hemorrhage, mass effect, or midline shift. No hydrocephalus. The basilar cisterns are patent. No evidence of territorial infarct or acute ischemia. Calcified meningioma versus prominent hyperextends cyst in the right frontal region, unchanged from previous. Possible calcified dural-based meningioma in the left parietal region, also unchanged. Vascular: Atherosclerosis of skullbase vasculature without hyperdense vessel or abnormal calcification. Skull: No skull fracture. Prominent right frontal hyperostosis versus partially calcified meningioma, unchanged from priors. Sinuses/Orbits: No acute findings. Bilateral cataract resection. Other: None. CT CERVICAL SPINE FINDINGS Alignment: Prominent dextroscoliotic curvature of the cervical spine, slightly progressed from prior exam. Bony fusion of C3-C4 and to lesser extent C4-C5 with straightening of normal lordosis. Chronic anterolisthesis of C7 on T1. No traumatic subluxation. Skull base and vertebrae: Motion limits assessment for acute fracture. Allowing for this, no acute fractures seen. Chronic bony ankylosis of C3-C4, vertebral bodies and posterior elements, partial bony ankylosis of C4-C5 vertebral bodies. This is unchanged. The dens is intact. No gross skull base fracture. Soft tissues and spinal canal: No prevertebral fluid or swelling. No visible canal hematoma. Disc levels: Complete disc space loss at C3-C4 with bony ankylosis. Partial ankylosis of C4-C5 with disc space loss. Additional disc space  narrowing and endplate spurring throughout the cervical spine. Upper chest: Biapical pleuroparenchymal scarring. Probable enlarged thyroid gland again seen. Other: None. IMPRESSION: 1. Motion limits detailed evaluation, particularly through the cervical  spine. 2. No acute intracranial abnormality. No skull fracture. 3. Generalized atrophy and chronic small vessel ischemia. Remote right parietal infarct. 4. Prominent multilevel degenerative change throughout the cervical spine without evidence of acute fracture or subluxation. Electronically Signed   By: Narda Rutherford M.D.   On: 07/26/2018 06:31   Dg Hip Unilat W Or Wo Pelvis 2-3 Views Left  Result Date: 07/26/2018 CLINICAL DATA:  Left hip pain after unwitnessed fall. EXAM: DG HIP (WITH OR WITHOUT PELVIS) 2-3V LEFT COMPARISON:  Pelvis radiograph 02/15/2017 FINDINGS: Displaced left femoral neck fracture with proximal migration of the femoral shaft. Mild apex anterior angulation. Femoral head remains seated. Pubic rami are intact. No additional fracture of the pelvis. Calcifications in the pelvis may be calcified fibroids, unchanged from pelvic radiograph 02/15/2017. IMPRESSION: Displaced left femoral neck fracture with proximal migration of the femoral shaft. Electronically Signed   By: Narda Rutherford M.D.   On: 07/26/2018 06:16    Time Spent in minutes  30   Susa Raring M.D on 07/29/2018 at 9:04 AM  To page go to [email or web address] - password [facility]

## 2018-07-29 NOTE — Progress Notes (Signed)
PHARMACY NOTE:  ANTIMICROBIAL RENAL DOSAGE ADJUSTMENT  Current antimicrobial regimen includes a mismatch between antimicrobial dosage and estimated renal function.  As per policy approved by the Pharmacy & Therapeutics and Medical Executive Committees, the antimicrobial dosage will be adjusted accordingly.  Current antimicrobial dosage:  Ancef 2gm q6h x2 doses  Indication: surgical prophylaxis  Renal Function:  Estimated Creatinine Clearance: 18.3 mL/min (A) (by C-G formula based on SCr of 1.54 mg/dL (H)).     Antimicrobial dosage has been changed to:  Ancef 1gm IV q12h x1 dose  Additional comments:   Thank you for allowing pharmacy to be a part of this patient's care.  Harland GermanAndrew Meyer, PharmD Clinical Pharmacist **Pharmacist phone directory can now be found on [email or web address] ([email or web address]).  Listed under [facility].

## 2018-07-29 NOTE — Transfer of Care (Signed)
Immediate Anesthesia Transfer of Care Note  Patient: Roberta BeringDorothy Griffin  Procedure(s) Performed: ARTHROPLASTY BIPOLAR HIP (HEMIARTHROPLASTY) (Left Hip)  Patient Location: PACU  Anesthesia Type:General  Level of Consciousness: drowsy and patient cooperative  Airway & Oxygen Therapy: Patient Spontanous Breathing and Patient connected to face mask oxygen  Post-op Assessment: Report given to RN and Post -op Vital signs reviewed and stable  Post vital signs: Reviewed and stable  Last Vitals:  Vitals Value Taken Time  BP 171/80 07/29/2018  2:47 PM  Temp    Pulse 72   Resp 19 07/29/2018  2:48 PM  SpO2 97%   Vitals shown include unvalidated device data.  Last Pain:  Vitals:   07/29/18 0822  TempSrc:   PainSc: Asleep         Complications: No apparent anesthesia complications

## 2018-07-29 NOTE — Op Note (Signed)
NAMEMAKYIAH, LIE MEDICAL RECORD [id number] ACCOUNT [id number] DATE OF BIRTH:Dec 10, 1925 FACILITY: [facility] LOCATION: [place], [place]  OPERATIVE REPORT  DATE OF PROCEDURE:  07/29/2018  PREOPERATIVE DIAGNOSIS:  Displaced subcapital fracture, left hip.  POSTOPERATIVE DIAGNOSIS:  Displaced subcapital fracture, left hip.  PROCEDURE:  Bipolar arthroplasty, left hip.  SURGEON:  Joni Fears, MD  ASSISTANT:  Biagio Borg, PA-C.  ANESTHESIA:  General.  COMPLICATIONS:  None.  COMPONENTS:  DePuy Summit femoral stem size 2 with a self-centering bipolar head 20 mm internal diameter 44 mm outer diameter, component was press-fit in the femur.  DESCRIPTION OF PROCEDURE:  The patient was met in the holding area and identified the left hip was the appropriate operative site.  The left lower extremity was shortened and externally rotated.  Because of the patient's dementia, she was unable to  answer any questions.  The patient was then transported to room 7.  She was carefully placed on the operating table and general anesthesia was induced without problem.  The patient was place in the lateral decubitus position with the left side up.  She  was secured to the operating room table with the Innomed hip system.  The left lower extremity was then prepped with chlorhexidine scrub and then DuraPrep x2 from the iliac crest to the mid-calf.  Sterile draping was performed.  Timeout was called.  Two grams of Ancef IV were administered.  A routine Southern incision was utilized and via sharp dissection carried down to subcutaneous tissue.  Gross bleeders were Bovie coagulated.  The IT band was identified and incised along the length of the skin incision.  Muscle fibers were separated.   There was a hemorrhagic bursa, which was evacuated.  Self-retaining retractors were inserted.  Short external rotators were intact.  There had been a very small hole in the capsule with bloody  fluid.  No evidence of any purulence.  Short external rotators were carefully incised from the posterior aspect of the greater trochanter.  Tendinous structures were tagged.  The capsule was then incised along the femoral neck and head.  The displaced subcapital fracture was identified.  I  was able to remove the head without any difficulty.  There were some pieces of bone in the acetabulum, which I removed.  Retractors were then placed about the proximal femur.  A starter hole was then made in the piriformis fossa.  Rasping was then performed to accept a 2 Summit stem.  This fit very nicely in the proximal femur and against the calcar.  I did ream the calcar  to be sure we had appropriate angle and good bone.  Femoral neck cut was about a fingerbreadth proximal to the lesser trochanter.  I measured the head as a 44.  I used a trial 44 head.  I inserted the head and then through a full range of motion, the hip appeared to be stable.  The joint was then copiously irrigated with saline solution.  I then inserted the final 2 Summit metallic stem flush on the calcar.  There was no instability.  I cleaned the Morse taper neck and then applied the bipolar head, 44 mm outer diameter.  I  checked to be sure there was no loose material in the acetabulum.  After reduction, it was placed through a full range of motion.  I thought it had excellent coverage and no instability.  Wound was again irrigated with saline solution.  I anatomically closed the capsule in 2 layers as well  as the short external rotators to be sure I had good posterior coverage.  I again irrigated the wound.  The iliotibial band was closed with a running #1 Vicryl.  The subcutaneous closed in several layers with Vicryl and Monocryl.  Skin closed with skin clips.  Sterile bulky dressing was applied followed by a knee immobilizer.  The patient tolerated the procedure without complications.  TN/NUANCE  D:07/29/2018 T:07/29/2018  JOB:[id number]

## 2018-07-29 NOTE — Anesthesia Procedure Notes (Signed)
Procedure Name: Intubation Date/Time: 07/29/2018 12:53 PM Performed by: Pearson Grippeobertson, Samantha M, CRNA Pre-anesthesia Checklist: Patient identified, Emergency Drugs available, Suction available and Patient being monitored Patient Re-evaluated:Patient Re-evaluated prior to induction Oxygen Delivery Method: Circle system utilized Preoxygenation: Pre-oxygenation with 100% oxygen Induction Type: IV induction Ventilation: Mask ventilation without difficulty and Oral airway inserted - appropriate to patient size Laryngoscope Size: Hyacinth MeekerMiller and 2 Grade View: Grade I Tube type: Oral Tube size: 7.0 mm Number of attempts: 1 Airway Equipment and Method: Stylet and Oral airway Placement Confirmation: ETT inserted through vocal cords under direct vision,  positive ETCO2 and breath sounds checked- equal and bilateral Secured at: 21 cm Tube secured with: Tape Dental Injury: Teeth and Oropharynx as per pre-operative assessment

## 2018-07-29 NOTE — Progress Notes (Signed)
The recent History & Physical has been reviewed. I have personally examined the patient today. There is no interval change to the documented History & Physical. The patient would like to proceed with the procedure.  [name] 07/29/2018,  12:01 PM  Patient ID: Roberta Griffin, female   DOB: 08/04/1926, 84 y.o.   MRN: [id number]

## 2018-07-29 NOTE — Brief Op Note (Signed)
PATIENT ID:      Roberta BeringDorothy Griffin  MRN:     [id number] DOB/AGE:    02/28/1926 / 84 y.o.       OPERATIVE REPORT    DATE OF PROCEDURE:  07/29/2018       PREOPERATIVE DIAGNOSIS:   displaced Left Subcap Hip fracture                                                       Estimated body mass index is 21.83 kg/m as calculated from the following:   Height as of this encounter: 5\' 1"  (1.549 m).   Weight as of this encounter: 52.4 kg.     POSTOPERATIVE DIAGNOSIS:   displaced Left Subcap Hip fracture                                                                     Estimated body mass index is 21.83 kg/m as calculated from the following:   Height as of this encounter: 5\' 1"  (1.549 m).   Weight as of this encounter: 52.4 kg.     PROCEDURE:  Procedure(s): ARTHROPLASTY BIPOLAR HIP (HEMIARTHROPLASTY)      SURGEON:  Norlene CampbellPeter Whitfield, MD    ASSISTANT:   Jacqualine CodeBrian Petrarca, PA-C   (Present and scrubbed throughout the case, critical for assistance with exposure, retraction, instrumentation, and closure.)          ANESTHESIA: general     DRAINS: none :      TOURNIQUET TIME: * No tourniquets in log *    COMPLICATIONS:  None   CONDITION:  stable  PROCEDURE IN DETAIL: [id number]   [name] 07/29/2018, 2:21 PM  Patient ID: Roberta Beringorothy Griffin, female   DOB: 08/08/1926, 84 y.o.   MRN: [id number]

## 2018-07-29 NOTE — Progress Notes (Signed)
Patient to the Short Stay area - prepared for surgery

## 2018-07-30 ENCOUNTER — Encounter (HOSPITAL_COMMUNITY): Payer: Self-pay | Admitting: Orthopaedic Surgery

## 2018-07-30 DIAGNOSIS — S72012D Unspecified intracapsular fracture of left femur, subsequent encounter for closed fracture with routine healing: Secondary | ICD-10-CM

## 2018-07-30 LAB — BASIC METABOLIC PANEL
Anion gap: 12 (ref 5–15)
BUN: 21 mg/dL (ref 8–23)
CO2: 24 mmol/L (ref 22–32)
Calcium: 8.4 mg/dL — ABNORMAL LOW (ref 8.9–10.3)
Chloride: 103 mmol/L (ref 98–111)
Creatinine, Ser: 1.39 mg/dL — ABNORMAL HIGH (ref 0.44–1.00)
GFR calc Af Amer: 39 mL/min — ABNORMAL LOW (ref >60)
GFR calc non Af Amer: 33 mL/min — ABNORMAL LOW (ref >60)
Glucose, Bld: 186 mg/dL — ABNORMAL HIGH (ref 70–99)
Potassium: 4.5 mmol/L (ref 3.5–5.1)
Sodium: 139 mmol/L (ref 135–145)

## 2018-07-30 LAB — CBC
HCT: 35.3 % — ABNORMAL LOW (ref 36.0–46.0)
Hemoglobin: 11.5 g/dL — ABNORMAL LOW (ref 12.0–15.0)
MCH: 28.8 pg (ref 26.0–34.0)
MCHC: 32.6 g/dL (ref 30.0–36.0)
MCV: 88.5 fL (ref 80.0–100.0)
Platelets: 186 10*3/uL (ref 150–400)
RBC: 3.99 MIL/uL (ref 3.87–5.11)
RDW: 13.3 % (ref 11.5–15.5)
WBC: 8 10*3/uL (ref 4.0–10.5)
nRBC: 0 % (ref 0.0–0.2)

## 2018-07-30 MED ORDER — PROPOFOL 10 MG/ML IV BOLUS
INTRAVENOUS | Status: AC
  Filled 2018-07-30: qty 20

## 2018-07-30 MED ORDER — APIXABAN 2.5 MG PO TABS
2.5000 mg | ORAL_TABLET | Freq: Two times a day (BID) | ORAL | Status: DC
  Administered 2018-07-30 – 2018-07-31 (×3): 2.5 mg via ORAL
  Filled 2018-07-30 (×3): qty 1

## 2018-07-30 NOTE — [place] FL2 (Signed)
[place] MEDICAID FL2 LEVEL OF CARE SCREENING TOOL     IDENTIFICATION  Patient Name: Roberta Griffin Birthdate: 11/01/1925 Sex: female Admission Date (Current Location): 07/26/2018  [facility] [place]   Facility and Address:  [facility], [address], [place], [place] [address]      Provider Number: [id number]  Attending Physician Name and Address:  Burnadette PopAdhikari, Amrit, MD  Relative Name and Phone Number:  Velna HatchetSheila (daughter) [phone]    Current Level of Care: Hospital Recommended Level of Care: Skilled Nursing Facility Prior Approval Number:    Date Approved/Denied:   PASRR Number: [id number]  Discharge Plan: SNF    Current Diagnoses: Patient Active Problem List   Diagnosis Date Noted  . Subcapital fracture of left hip (HCC) 07/29/2018  . Hip fracture (HCC) 07/26/2018  . Depression with anxiety 07/26/2017  . High risk medication use 06/03/2017  . Chronic pain of both shoulders 06/03/2017  . Schizophrenia (HCC) 04/26/2017  . GAD (generalized anxiety disorder) 04/05/2017  . History of stroke 12/05/2016  . Pulmonary embolus (HCC) 12/05/2016  . Chronic diastolic CHF (congestive heart failure) (HCC) 03/04/2016  . Insomnia 03/04/2016  . Dysarthria 03/04/2016  . Thyroid nodule 03/04/2016  . Pancreatic mass   . HLD (hyperlipidemia)   . Dysphagia 02/18/2016  . CVA (cerebral vascular accident) (HCC) 02/17/2016  . COPD (chronic obstructive pulmonary disease) (HCC) 02/17/2016  . Alzheimer's dementia without behavioral disturbance (HCC) 02/17/2016  . Essential hypertension 02/17/2016  . Normocytic anemia 02/17/2016  . CKD (chronic kidney disease), stage III (HCC) 02/17/2016    Orientation RESPIRATION BLADDER Height & Weight     (memory impairment)  O2(nasal cannula 2L/min) Incontinent Weight: 52.4 kg Height:  5\' 1"  (154.9 cm)  BEHAVIORAL SYMPTOMS/MOOD NEUROLOGICAL BOWEL NUTRITION STATUS      Continent Diet(see discharge  summary)  AMBULATORY STATUS COMMUNICATION OF NEEDS Skin   Extensive Assist Verbally Surgical wounds(left hip closed surgical incision)                       Personal Care Assistance Level of Assistance  Bathing, Feeding, Dressing, Total care Bathing Assistance: Maximum assistance Feeding assistance: Maximum assistance Dressing Assistance: Maximum assistance Total Care Assistance: Maximum assistance   Functional Limitations Info  Sight, Hearing, Speech Sight Info: Adequate Hearing Info: Adequate Speech Info: Adequate    SPECIAL CARE FACTORS FREQUENCY  PT (By licensed PT), OT (By licensed OT)     PT Frequency: min 5x weekly OT Frequency: min 5x weekly            Contractures Contractures Info: Not present    Additional Factors Info  Code Status, Allergies Code Status Info: DNR Allergies Info: tramadol           Current Medications (07/30/2018):  This is the current hospital active medication list Current Facility-Administered Medications  Medication Dose Route Frequency Provider Last Rate Last Dose  . acetaminophen (TYLENOL) tablet 500 mg  500 mg Oral Q8H Leroy SeaSingh, Prashant K, MD   500 mg at 07/30/18 1359  . apixaban (ELIQUIS) tablet 2.5 mg  2.5 mg Oral BID Burnadette PopAdhikari, Amrit, MD   2.5 mg at 07/30/18 1059  . bisacodyl (DULCOLAX) EC tablet 5 mg  5 mg Oral Daily PRN Leroy SeaSingh, Prashant K, MD      . busPIRone (BUSPAR) tablet 15 mg  15 mg Oral BID Leroy SeaSingh, Prashant K, MD   15 mg at 07/30/18 1059  . citalopram (CELEXA) tablet 10 mg  10 mg Oral Daily Leroy Sea, MD   10 mg at 07/30/18 1059  . divalproex (DEPAKOTE) DR tablet 125 mg  125 mg Oral Daily Leroy Sea, MD   125 mg at 07/30/18 1059  . docusate sodium (COLACE) capsule 100 mg  100 mg Oral BID Jetty Peeks, PA-C   100 mg at 07/30/18 1059  . hydrALAZINE (APRESOLINE) injection 10 mg  10 mg Intravenous Q6H PRN Leroy Sea, MD      . HYDROcodone-acetaminophen (NORCO/VICODIN) 5-325 MG per tablet 1 tablet   1 tablet Oral Q6H PRN Leroy Sea, MD   1 tablet at 07/28/18 1122  . magnesium hydroxide (MILK OF MAGNESIA) suspension 30 mL  30 mL Oral Daily PRN Leroy Sea, MD      . memantine (NAMENDA XR) 24 hr capsule 14 mg  14 mg Oral Daily Leroy Sea, MD   14 mg at 07/30/18 1100  . menthol-cetylpyridinium (CEPACOL) lozenge 3 mg  1 lozenge Oral PRN Jacqualine Code D, PA-C       Or  . phenol (CHLORASEPTIC) mouth spray 1 spray  1 spray Mouth/Throat PRN Petrarca, Oris Drone, PA-C      . metoCLOPramide (REGLAN) tablet 5-10 mg  5-10 mg Oral Q8H PRN Jacqualine Code D, PA-C       Or  . metoCLOPramide (REGLAN) injection 5-10 mg  5-10 mg Intravenous Q8H PRN Petrarca, Brian D, PA-C      . metoprolol succinate (TOPROL-XL) 24 hr tablet 50 mg  50 mg Oral Daily Leroy Sea, MD   50 mg at 07/30/18 1100  . metoprolol tartrate (LOPRESSOR) injection 5 mg  5 mg Intravenous Q6H PRN Leroy Sea, MD      . ondansetron [facility]) tablet 4 mg  4 mg Oral Q6H PRN Jacqualine Code D, PA-C       Or  . ondansetron (ZOFRAN) injection 4 mg  4 mg Intravenous Q6H PRN Petrarca, Oris Drone, PA-C      . risperiDONE (RISPERDAL) tablet 0.25 mg  0.25 mg Oral QHS Leroy Sea, MD   0.25 mg at 07/29/18 2122  . sodium chloride flush (NS) 0.9 % injection 3 mL  3 mL Intravenous Q12H Leroy Sea, MD   3 mL at 07/28/18 1000  . Vitamin D (Ergocalciferol) (DRISDOL) capsule 50,000 Units  50,000 Units Oral Q30 days Leroy Sea, MD   50,000 Units at 07/26/18 1244     Discharge Medications: Please see discharge summary for a list of discharge medications.  Relevant Imaging Results:  Relevant Lab Results:   Additional Information SSN: [id number]  Gildardo Griffes, LCSW

## 2018-07-30 NOTE — Evaluation (Signed)
Physical Therapy Evaluation Patient Details Name: Roberta Griffin Behringer MRN: [id number] DOB: 11/01/1925 Today's Date: 07/30/2018   History of Present Illness   Roberta Griffin Mundorf  is a 84 y.o. female, history of PE in 2017, multinodular goiter, CKD 3, underlying dementia lives in a memory care unit, history of CVA, chronic diastolic CHF with EF 60% and COPD. Admitted s/p mechanical fall resulting kn L femoral shaft fracture. Pt s/p L hip hemi arthroplasty with post hip precautions.  Clinical Impression  Pt admitted with above. Pt with dementia at baseline limiting active participation in therapy due to lethargy and poor command follow. Pt did tolerate std pvt to chair with maxAx2. Pt will need to go Skilled Rehab section of SNF upon d/c for rehab. Acute PT to cont to follow.    Follow Up Recommendations SNF    Equipment Recommendations  (TBD at next venue)    Recommendations for Other Services       Precautions / Restrictions Precautions Precautions: Posterior Hip Precaution Booklet Issued: Yes (comment) Precaution Comments: provided to family as pt with dementia. family with understanding Required Braces or Orthoses: Knee Immobilizer - Left Knee Immobilizer - Left: On at all times(to help prevent hip dislocation) Restrictions Weight Bearing Restrictions: Yes LLE Weight Bearing: Weight bearing as tolerated      Mobility  Bed Mobility Overal bed mobility: Needs Assistance Bed Mobility: Supine to Sit     Supine to sit: Max assist;+2 for physical assistance     General bed mobility comments: pt did not resist but also did not assist with transfer to EOB, HOB elevated, maxA for trunk elevation and bilat LE management  Transfers Overall transfer level: Needs assistance Equipment used: 2 person hand held assist Transfers: Sit to/from [employer]GI CorporationStand;Stand Pivot Transfers Sit to Stand: Max assist;+2 physical assistance Stand pivot transfers: Max assist;+2 physical assistance       General  transfer comment: 2 person lift with gait belt. Pt able to complete full standing position, max tactile cues to pvt on R LE to chair going towards the R. Pt with minimal L LE WBing tolerance at this time, maxAx2 to descend into chair  Ambulation/Gait             General Gait Details: unable at this time  Stairs            Wheelchair Mobility    Modified Rankin (Stroke Patients Only)       Balance Overall balance assessment: Needs assistance Sitting-balance support: Feet supported;No upper extremity supported Sitting balance-Leahy Scale: Poor Sitting balance - Comments: dependent on posterior support however progressed to minA towards end of 5 min session of sitting at EOB   Standing balance support: During functional activity;Bilateral upper extremity supported Standing balance-Leahy Scale: Poor Standing balance comment: dependent on physical assist                             Pertinent Vitals/Pain Pain Assessment: Faces Faces Pain Scale: Hurts even more Pain Location: L hip Pain Descriptors / Indicators: Grimacing(with mobility) Pain Intervention(s): Monitored during session    Home Living Family/patient expects to be discharged to:: Skilled nursing facility                 Additional Comments[place], will need skilled section versus long term care section    Prior Function Level of Independence: Needs assistance   Gait / Transfers Assistance Needed: uses RW and w/c, staff  attempts to keep her in w/c however with dementia pt tries to amb on her own with and without RW  ADL's / Homemaking Assistance Needed: dependent on facility staff        Hand Dominance   Dominant Hand: Right    Extremity/Trunk Assessment   Upper Extremity Assessment Upper Extremity Assessment: Defer to OT evaluation    Lower Extremity Assessment Lower Extremity Assessment: Generalized weakness(L LE in KI, no active movement of L LE)     Cervical / Trunk Assessment Cervical / Trunk Assessment: Kyphotic  Communication   Communication: Expressive difficulties  Cognition Arousal/Alertness: Lethargic Behavior During Therapy: Flat affect Overall Cognitive Status: History of cognitive impairments - at baseline                                 General Comments: pt not oriented to place, family, or date at baseline due to dementia      General Comments General comments (skin integrity, edema, etc.): pt with L hip dressing intact, VSS, SPO2 at 94% on RA    Exercises     Assessment/Plan    PT Assessment Patient needs continued PT services  PT Problem List Decreased strength;Decreased range of motion;Decreased activity tolerance;Decreased balance;Decreased mobility       PT Treatment Interventions DME instruction;Gait training;Functional mobility training;Therapeutic activities;Therapeutic exercise;Balance training    PT Goals (Current goals can be found in the Care Plan section)  Acute Rehab PT Goals Patient Stated Goal: didn't state PT Goal Formulation: With family Time For Goal Achievement: 08/13/18 Potential to Achieve Goals: Fair    Frequency Min 3X/week   Barriers to discharge        Co-evaluation PT/OT/SLP Co-Evaluation/Treatment: Yes Reason for Co-Treatment: Complexity of the patient's impairments (multi-system involvement) PT goals addressed during session: Mobility/safety with mobility         AM-PAC PT "6 Clicks" Mobility  Outcome Measure Help needed turning from your back to your side while in a flat bed without using bedrails?: Total Help needed moving from lying on your back to sitting on the side of a flat bed without using bedrails?: Total Help needed moving to and from a bed to a chair (including a wheelchair)?: Total Help needed standing up from a chair using your arms (e.g., wheelchair or bedside chair)?: Total Help needed to walk in hospital room?: Total Help needed  climbing 3-5 steps with a railing? : Total 6 Click Score: 6    End of Session Equipment Utilized During Treatment: Gait belt Activity Tolerance: Patient tolerated treatment well Patient left: in chair;with call bell/phone within reach;with chair alarm set;with family/visitor present Nurse Communication: Mobility status PT Visit Diagnosis: Unsteadiness on feet (R26.81);Pain Pain - Right/Left: Left Pain - part of body: Hip    Time: 1610-9604 PT Time Calculation (min) (ACUTE ONLY): 30 min   Charges:   PT Evaluation $PT Eval Moderate Complexity: 1 Mod          Lewis Shock, PT, DPT Acute Rehabilitation Services Pager #: [phone] Office #: [phone]   Iona Hansen 07/30/2018, 12:39 PM

## 2018-07-30 NOTE — Progress Notes (Signed)
Subjective: 1 Day Post-Op Procedure(s) (LRB): ARTHROPLASTY BIPOLAR HIP (HEMIARTHROPLASTY) (Left) Patient unable to communicate pain level-appears comfortable  Objective: Vital signs in last 24 hours: Temp:  [97 F (36.1 C)-98.2 F (36.8 C)] 97.8 F (36.6 C) (12/18 0604) Pulse Rate:  [69-84] 84 (12/18 0604) Resp:  [14-18] 18 (12/18 0604) BP: (92-171)/(57-90) 117/57 (12/18 0604) SpO2:  [91 %-100 %] 91 % (12/18 0604)  Intake/Output from previous day: 12/17 0701 - 12/18 0700 In: 1350 [I.V.:1300] Out: 1150 [Urine:1000; Blood:150] Intake/Output this shift: No intake/output data recorded.  Recent Labs    07/28/18 0241 07/29/18 0331 07/30/18 0110  HGB 12.4 12.7 11.5*   Recent Labs    07/29/18 0331 07/30/18 0110  WBC 8.9 8.0  RBC 4.50 3.99  HCT 40.7 35.3*  PLT 178 186   Recent Labs    07/29/18 0331 07/30/18 0110  NA 139 139  K 3.9 4.5  CL 104 103  CO2 24 24  BUN 19 21  CREATININE 1.54* 1.39*  GLUCOSE 107* 186*  CALCIUM 8.6* 8.4*   No results for input(s): LABPT, INR in the last 72 hours. Moving toes No cellulitis present Compartment soft No distal edema Dressing left hip clean and dry  Assessment/Plan: 1 Day Post-Op Procedure(s) (LRB): ARTHROPLASTY BIPOLAR HIP (HEMIARTHROPLASTY) (Left) Advance diet Up with therapy Discharge to SNF OOB with PT if tolerated-must wear knee immobilizer to prevent hip dislocation SNF when stable from med standpoint Continue eliquis Roberta Griffin W Whitfield 07/30/2018, 7:34 AM

## 2018-07-30 NOTE — Progress Notes (Signed)
PROGRESS NOTE    Roberta Griffin  WUJ:[id number] DOB: 08/19/1925 DOA: 07/26/2018 PCP: Patient, No Pcp Per   Brief Narrative: Patient is a 84 year old female with past medical history of embolism to 1017, multinodular goiter, CKD stage III, severe dementia who is from memory care unit, history of CVA, chronic diastolic CHF with ejection fraction of 60% was brought out after a mechanical fall.  She was found to have displaced subcapital fracture of the left hip.  Orthopedics was consulted and she underwent  hemiarthroplasty of the left hip.  Social worker has been consulted.  Awaiting PT/OT evaluation.  Assessment & Plan:   Principal Problem:   Subcapital fracture of left hip (HCC) Active Problems:   CVA (cerebral vascular accident) (HCC)   COPD (chronic obstructive pulmonary disease) (HCC)   Alzheimer's dementia without behavioral disturbance (HCC)   Essential hypertension   CKD (chronic kidney disease), stage III (HCC)   Chronic diastolic CHF (congestive heart failure) (HCC)   Pulmonary embolus (HCC)   Hip fracture (HCC)  Mechanical fall with displaced subcapital fracture of the left hip: Orthopedics following.  Underwent hemiarthroplasty of the left hip. PT/OT consulted. Must wear knee immobilizer to prevent hip dislocation while working with physical therapy. Restarted Eliquis for DVT prophylaxis.  She most likely needs skilled nursing facility on discharge.  Social worker consulted.  History of PE: In 2017.  On Eliquis 2.5 mg twice daily at home.  Continued for DVT prophylaxis.  Chronic diastolic CHF: Ejection fraction of 60% on echocardiogram as per 2018.  Currently compensated.  Hypertension: Continue home dose beta-blocker.  Currently blood pressure stable.  PRN medications for hypertensive episodes.  Advance underlying Alzheimer's dementia: Continue PRN Haldol if needed.  Minimize use of narcotics and benzodiazepines.  Acute kidney injury on CKD stage III: Baseline  creatinine around 1.2.  Creatinine 1.39 today.  We will continue to monitor.          DVT prophylaxis: Eliqius Code Status: DNR Family Communication: Daughter Disposition Plan: SNF   Consultants: Orthopedics  Procedures: Left hemiarthroplasty  Antimicrobials: None  Subjective: Patient seen and examined at the bedside today.  Remains hemodynamically stable.  Severe demented.  Did not give any reliable history or information.  Pain looks well controlled.  Objective: Vitals:   07/29/18 1547 07/29/18 1605 07/29/18 2020 07/30/18 0604  BP: (!) 158/78 (!) 157/78 92/65 (!) 117/57  Pulse: 70 69 75 84  Resp: 17 18 14 18   Temp: (!) 97.3 F (36.3 C) 97.6 F (36.4 C) 98.2 F (36.8 C) 97.8 F (36.6 C)  TempSrc:  Axillary Oral Oral  SpO2: 100% 97% 100% 91%  Weight:      Height:        Intake/Output Summary (Last 24 hours) at 07/30/2018 0856 Last data filed at 07/30/2018 0844 Gross per 24 hour  Intake 1590 ml  Output 1150 ml  Net 440 ml   Filed Weights   07/26/18 0827  Weight: 52.4 kg    Examination:  General exam: Appears calm and comfortable ,Not in distress,thin built HEENT:PERRL,Oral mucosa moist, Ear/Nose normal on gross exam Respiratory system: Bilateral equal air entry, normal vesicular breath sounds, no wheezes or crackles  Cardiovascular system: S1 & S2 heard, RRR. No JVD, murmurs, rubs, gallops or clicks. No pedal edema. Gastrointestinal system: Abdomen is nondistended, soft and nontender. No organomegaly or masses felt. Normal bowel sounds heard. Central nervous system: Not Alert and oriented.   Severe dementia Extremities: No edema, no clubbing ,no cyanosis, distal peripheral pulses  palpable.  Clean surgical scar on the left hip.  Left knee immobilizer Skin: No rashes, lesions or ulcers,no icterus ,no pallor    Data Reviewed: I have personally reviewed following labs and imaging studies  CBC: Recent Labs  Lab 07/26/18 0440 07/26/18 0948  07/27/18 0508 07/28/18 0241 07/29/18 0331 07/30/18 0110  WBC 9.2 13.1* 10.6* 8.9 8.9 8.0  NEUTROABS 6.6  --   --   --   --   --   HGB 13.7 13.3 12.7 12.4 12.7 11.5*  HCT 46.2* 41.9 40.0 38.7 40.7 35.3*  MCV 94.5 90.5 89.7 89.4 90.4 88.5  PLT 227 200 193 173 178 186   Basic Metabolic Panel: Recent Labs  Lab 07/26/18 0440 07/26/18 0948 07/27/18 0508 07/28/18 0241 07/29/18 0331 07/30/18 0110  NA 143  --  142 142 139 139  K 4.4  --  4.3 3.9 3.9 4.5  CL 107  --  105 106 104 103  CO2 24  --  25 24 24 24   GLUCOSE 107*  --  116* 118* 107* 186*  BUN 28*  --  34* 28* 19 21  CREATININE 1.44* 1.49* 2.02* 1.73* 1.54* 1.39*  CALCIUM 9.4  --  8.9 8.6* 8.6* 8.4*  MG  --   --   --  2.0 1.8  --    GFR: Estimated Creatinine Clearance: 20.3 mL/min (A) (by C-G formula based on SCr of 1.39 mg/dL (H)). Liver Function Tests: No results for input(s): AST, ALT, ALKPHOS, BILITOT, PROT, ALBUMIN in the last 168 hours. No results for input(s): LIPASE, AMYLASE in the last 168 hours. No results for input(s): AMMONIA in the last 168 hours. Coagulation Profile: Recent Labs  Lab 07/26/18 0440 07/27/18 0508  INR 1.08 1.16   Cardiac Enzymes: No results for input(s): CKTOTAL, CKMB, CKMBINDEX, TROPONINI in the last 168 hours. BNP (last 3 results) No results for input(s): PROBNP in the last 8760 hours. HbA1C: No results for input(s): HGBA1C in the last 72 hours. CBG: No results for input(s): GLUCAP in the last 168 hours. Lipid Profile: No results for input(s): CHOL, HDL, LDLCALC, TRIG, CHOLHDL, LDLDIRECT in the last 72 hours. Thyroid Function Tests: No results for input(s): TSH, T4TOTAL, FREET4, T3FREE, THYROIDAB in the last 72 hours. Anemia Panel: No results for input(s): VITAMINB12, FOLATE, FERRITIN, TIBC, IRON, RETICCTPCT in the last 72 hours. Sepsis Labs: No results for input(s): PROCALCITON, LATICACIDVEN in the last 168 hours.  Recent Results (from the past 240 hour(s))  MRSA PCR Screening      Status: None   Collection Time: 07/26/18  8:20 AM  Result Value Ref Range Status   MRSA by PCR NEGATIVE NEGATIVE Final    Comment:        The GeneXpert MRSA Assay (FDA approved for NASAL specimens only), is one component of a comprehensive MRSA colonization surveillance program. It is not intended to diagnose MRSA infection nor to guide or monitor treatment for MRSA infections. Performed at [facility], [address], [place], [place] [address]          Radiology Studies: Pelvis Portable  Result Date: 07/29/2018 CLINICAL DATA:  83 y/o  F; post left hemiarthroplasty. EXAM: PORTABLE PELVIS 1-2 VIEWS COMPARISON:  02/15/2017 pelvis radiographs FINDINGS: Left proximal femur hemiarthroplasty. No periprosthetic lucency or fracture. Surrounding postsurgical changes of soft tissues with multiple small foci of air, edema, and skin staples. No new fracture or dislocation is evident. IMPRESSION: Inter left proximal femur hemiarthroplasty and postsurgical changes. Electronically Signed  By: Mitzi Hansen M.D.   On: 07/29/2018 15:38        Scheduled Meds: . acetaminophen  500 mg Oral Q8H  . apixaban  2.5 mg Oral BID  . busPIRone  15 mg Oral BID  . citalopram  10 mg Oral Daily  . divalproex  125 mg Oral Daily  . docusate sodium  100 mg Oral BID  . memantine  14 mg Oral Daily  . metoprolol succinate  50 mg Oral Daily  . risperiDONE  0.25 mg Oral QHS  . sodium chloride flush  3 mL Intravenous Q12H  . Vitamin D (Ergocalciferol)  50,000 Units Oral Q30 days   Continuous Infusions:   LOS: 4 days    Time spent: 35 mins,.More than 50% of that time was spent in counseling and/or coordination of care.      Burnadette Pop, MD [facility] Pager [phone]  If 7PM-7AM, please contact night-coverage [email or web address] Password [facility] 07/30/2018, 8:56 AM

## 2018-07-30 NOTE — Care Management Important Message (Signed)
Important Message  Patient Details  Name: Laurena BeringDorothy Piloto MRN: [id number] Date of Birth: 12/11/1925   Medicare Important Message Given:  Yes  Due to illness patient was nota able to sign.Unsigned copy left at patient bedside. Iris Bratton 07/30/2018, 4:06 PM

## 2018-07-30 NOTE — Clinical Social Work Note (Signed)
Clinical Social Work Assessment  Patient Details  Name: Roberta Griffin MRN: [id number] Date of Birth: 12/15/1925  Date of referral:  07/30/18               Reason for consult:  Discharge Planning                Permission sought to share information with:  Case Manager, Facility Medical sales representativeContact Representative, Family Supports Permission granted to share information::  Yes, Verbal Permission Granted  Name::     Roberta Griffin  Agency::  SNFs  Relationship::  daughter  Contact Information:  [phone]  Housing/Transportation Living arrangements for the past 2 months:  Skilled Nursing Facility Source of Information:  Adult Children Patient Interpreter Needed:  None Criminal Activity/Legal Involvement Pertinent to Current Situation/Hospitalization:  No - Comment as needed Significant Relationships:  Adult Children Lives with:  Facility Resident Do you feel safe going back to the place where you live?  Yes Need for family participation in patient care:  Yes (Comment)  Care giving concerns:  CSW received referral for possible SNF placement at time of discharge. Spoke with patient's daughter Roberta Griffin regarding possibility of SNF placement, as patient is disoriented X4. Patient's family   is currently unable to care for her at their home given patient's current needs and fall risk.  Patient's daughter Roberta Griffin expressed understanding of PT recommendation and are agreeable to SNF placement at time of discharge. CSW to continue to follow and assist with discharge planning needs.     Social Worker assessment / plan:  Spoke with patient's daughter Roberta Griffin  concerning [facility] before returning home. Daughter reports patient is from [place] [facility] and they would like her to return there.   Employment status:  Retired Database administratornsurance information:  Managed Medicare PT Recommendations:  Skilled Nursing Facility Information / Referral to community resources:  Skilled Nursing Facility  Patient/Family's  Response to care:  Patient's family recognizes need for rehab before returning home and are agreeable to a SNF in [place]. They report preference for [facility] . CSW explained insurance authorization process. Patient's family reported that they want patient to get stronger to be able to come back home.    Patient/Family's Understanding of and Emotional Response to Diagnosis, Current Treatment, and Prognosis:  Patient/family is realistic regarding therapy needs and expressed being hopeful for SNF placement and [facility]. Patient's daughter Roberta Griffin expressed understanding of CSW role and discharge process as well as medical condition. No questions/concerns about plan or treatment.    Emotional Assessment Appearance:  Appears stated age Attitude/Demeanor/Rapport:  Unable to Assess Affect (typically observed):  Unable to Assess Orientation:  (disoriented X4, memory impairment) Alcohol / Substance use:  Not Applicable Psych involvement (Current and /or in the community):  No (Comment)  Discharge Needs  Concerns to be addressed:  Discharge Planning Concerns Readmission within the last 30 days:  No Current discharge risk:  Dependent with Mobility Barriers to Discharge:  Continued Medical Work up   Dynegyshley M Hill, LCSW 07/30/2018, 2:39 PM

## 2018-07-30 NOTE — Progress Notes (Signed)
Orthopedic Tech Progress Note Patient Details:  Roberta BeringDorothy Griffin 04/17/1926 [id number]  Patient ID: Roberta Beringorothy Muscarella, female   DOB: 06/28/1926, 84 y.o.   MRN: [id number] Applied ohf  Trinna PostMartinez, Manuel J 07/30/2018, 11:47 AM

## 2018-07-30 NOTE — Evaluation (Signed)
Occupational Therapy Evaluation Patient Details Name: Roberta Griffin MRN: [id number] DOB: [birth year] Today's Date: 07/30/2018    History of Present Illness  Roberta Griffin  is a 84 y.o. female, history of PE in 2017, multinodular goiter, CKD 3, underlying dementia lives in a memory care unit, history of CVA, chronic diastolic CHF with EF 60% and COPD. Admitted s/p mechanical fall resulting kn L femoral shaft fracture. Pt s/p L hip hemi arthroplasty with post hip precautions.   Clinical Impression   PTA Pt living at facility, dependent in bathing/dressing performing own feeding and transfers - dependent on RW and WC for mobility. Pt with limited ability to participate during today's therapy session. Max A +2 for transfers and dependent in all aspects of ADL at this time. Pt will need to go Skilled Rehab section of SNF upon d/c for rehab. OT will defer to SNF for further OT in a familiar environment with staff that are familiar with her.    Follow Up Recommendations  SNF;Supervision/Assistance - 24 hour    Equipment Recommendations  None recommended by OT    Recommendations for Other Services       Precautions / Restrictions Precautions Precautions: Posterior Hip Precaution Booklet Issued: Yes (comment) Precaution Comments: provided to family as pt with dementia. family with understanding Required Braces or Orthoses: Knee Immobilizer - Left Knee Immobilizer - Left: On at all times(to help prevent hip dislocation) Restrictions Weight Bearing Restrictions: Yes LLE Weight Bearing: Weight bearing as tolerated      Mobility Bed Mobility Overal bed mobility: Needs Assistance Bed Mobility: Supine to Sit     Supine to sit: Max assist;+2 for physical assistance     General bed mobility comments: pt did not resist but also did not assist with transfer to EOB, HOB elevated, maxA for trunk elevation and bilat LE management  Transfers Overall transfer level: Needs  assistance Equipment used: 2 person hand held assist Transfers: Sit to/from [employer] Sit to Stand: Max assist;+2 physical assistance Stand pivot transfers: Max assist;+2 physical assistance       General transfer comment: 2 person lift with gait belt. Pt able to complete full standing position, max tactile cues to pvt on R LE to chair going towards the R. Pt with minimal L LE WBing tolerance at this time, maxAx2 to descend into chair    Balance Overall balance assessment: Needs assistance Sitting-balance support: Feet supported;No upper extremity supported Sitting balance-Leahy Scale: Poor Sitting balance - Comments: dependent on posterior support however progressed to minA towards end of 5 min session of sitting at EOB   Standing balance support: During functional activity;Bilateral upper extremity supported Standing balance-Leahy Scale: Poor Standing balance comment: dependent on physical assist                           ADL either performed or assessed with clinical judgement   ADL Overall ADL's : Needs assistance/impaired Eating/Feeding: Moderate assistance   Grooming: Moderate assistance   Upper Body Bathing: Maximal assistance   Lower Body Bathing: Total assistance   Upper Body Dressing : Maximal assistance   Lower Body Dressing: Total assistance   Toilet Transfer: Maximal assistance;+2 for physical assistance;+2 for safety/equipment;Stand-pivot Toilet Transfer Details (indicate cue type and reason): simulated through recliner transfer Toileting- Clothing Manipulation and Hygiene: Total assistance       Functional mobility during ADLs: Maximal assistance;+2 for physical assistance;+2 for safety/equipment       Vision  Additional Comments: Pt largely kept eyes closed throughout session     Perception     Praxis      Pertinent Vitals/Pain Pain Assessment: Faces Faces Pain Scale: Hurts even more Pain Location: L hip Pain  Descriptors / Indicators: Grimacing(with mobility) Pain Intervention(s): Monitored during session;Repositioned     Hand Dominance Right   Extremity/Trunk Assessment Upper Extremity Assessment Upper Extremity Assessment: Generalized weakness   Lower Extremity Assessment Lower Extremity Assessment: Defer to PT evaluation   Cervical / Trunk Assessment Cervical / Trunk Assessment: Kyphotic   Communication Communication Communication: Expressive difficulties   Cognition Arousal/Alertness: Lethargic Behavior During Therapy: Flat affect Overall Cognitive Status: History of cognitive impairments - at baseline                                 General Comments: pt not oriented to place, family, or date at baseline due to dementia   General Comments       Exercises     Shoulder Instructions      Home Living Family/patient expects to be discharged to:: Skilled nursing facility                                 Additional Comments: pt resides in [place], will need skilled section versus long term care section      Prior Functioning/Environment Level of Independence: Needs assistance  Gait / Transfers Assistance Needed: uses RW and w/c, staff attempts to keep her in w/c however with dementia pt tries to amb on her own with and without RW ADL's / Homemaking Assistance Needed: dependent on facility staff            OT Problem List: Decreased strength;Decreased activity tolerance;Impaired balance (sitting and/or standing);Decreased cognition;Decreased safety awareness;Decreased knowledge of use of DME or AE;Decreased knowledge of precautions;Pain      OT Treatment/Interventions:      OT Goals(Current goals can be found in the care plan section) Acute Rehab OT Goals Patient Stated Goal: didn't state  OT Frequency:     Barriers to D/C:            Co-evaluation PT/OT/SLP Co-Evaluation/Treatment: Yes Reason for Co-Treatment: Necessary to  address cognition/behavior during functional activity;For patient/therapist safety;To address functional/ADL transfers;Complexity of the patient's impairments (multi-system involvement) PT goals addressed during session: Mobility/safety with mobility;Balance;Strengthening/ROM OT goals addressed during session: ADL's and self-care;Strengthening/ROM      AM-PAC OT "6 Clicks" Daily Activity     Outcome Measure Help from another person eating meals?: A Lot Help from another person taking care of personal grooming?: A Lot Help from another person toileting, which includes using toliet, bedpan, or urinal?: Total Help from another person bathing (including washing, rinsing, drying)?: A Lot Help from another person to put on and taking off regular upper body clothing?: A Lot Help from another person to put on and taking off regular lower body clothing?: Total 6 Click Score: 10   End of Session Equipment Utilized During Treatment: Gait belt Nurse Communication: Mobility status;Weight bearing status;Precautions  Activity Tolerance: Patient tolerated treatment well Patient left: in chair;with call bell/phone within reach;with chair alarm set;with family/visitor present  OT Visit Diagnosis: Unsteadiness on feet (R26.81);Other abnormalities of gait and mobility (R26.89);History of falling (Z91.81);Other symptoms and signs involving cognitive function;Pain Pain - Right/Left: Left Pain - part of body: Leg;Hip  Time: [id number] OT Time Calculation (min): 30 min Charges:  OT General Charges $OT Visit: 1 Visit OT Evaluation $OT Eval Moderate Complexity: 1 Mod  Sherryl MangesLaura Hilliard OTR/L Acute Rehabilitation Services Pager: [phone] Office: [phone]  Evern BioLaura J Hilliard 07/30/2018, 4:30 PM

## 2018-07-30 NOTE — Discharge Instructions (Signed)
Information on my medicine - ELIQUIS (apixaban)  This medication education was reviewed with me or my healthcare representative as part of my discharge preparation.    Why was Eliquis prescribed for you? Eliquis was prescribed to treat blood clots that may have been found in the veins of your legs (deep vein thrombosis) or in your lungs (pulmonary embolism) and to reduce the risk of them occurring again.  What do You need to know about Eliquis ? The dose is 2.5mg tablet taken TWICE daily.  Eliquis may be taken with or without food.   Try to take the dose about the same time in the morning and in the evening. If you have difficulty swallowing the tablet whole please discuss with your pharmacist how to take the medication safely.  Take Eliquis exactly as prescribed and DO NOT stop taking Eliquis without talking to the doctor who prescribed the medication.  Stopping may increase your risk of developing a new blood clot.  Refill your prescription before you run out.  After discharge, you should have regular check-up appointments with your healthcare provider that is prescribing your Eliquis.    What do you do if you miss a dose? If a dose of ELIQUIS is not taken at the scheduled time, take it as soon as possible on the same day and twice-daily administration should be resumed. The dose should not be doubled to make up for a missed dose.  Important Safety Information A possible side effect of Eliquis is bleeding. You should call your healthcare provider right away if you experience any of the following: ? Bleeding from an injury or your nose that does not stop. ? Unusual colored urine (red or dark brown) or unusual colored stools (red or black). ? Unusual bruising for unknown reasons. ? A serious fall or if you hit your head (even if there is no bleeding).  Some medicines may interact with Eliquis and might increase your risk of bleeding or clotting while on Eliquis. To help avoid  this, consult your healthcare provider or pharmacist prior to using any new prescription or non-prescription medications, including herbals, vitamins, non-steroidal anti-inflammatory drugs (NSAIDs) and supplements.  This website has more information on Eliquis (apixaban): http://www.eliquis.com/eliquis/home   

## 2018-07-30 NOTE — Anesthesia Postprocedure Evaluation (Signed)
Anesthesia Post Note  Patient: Laurena BeringDorothy Jeanmarie  Procedure(s) Performed: ARTHROPLASTY BIPOLAR HIP (HEMIARTHROPLASTY) (Left Hip)     Patient location during evaluation: PACU Anesthesia Type: General Level of consciousness: awake and alert Pain management: pain level controlled Vital Signs Assessment: post-procedure vital signs reviewed and stable Respiratory status: spontaneous breathing, nonlabored ventilation, respiratory function stable and patient connected to nasal cannula oxygen Cardiovascular status: blood pressure returned to baseline and stable Postop Assessment: no apparent nausea or vomiting Anesthetic complications: no    Last Vitals:  Vitals:   07/29/18 2020 07/30/18 0604  BP: 92/65 (!) 117/57  Pulse: 75 84  Resp: 14 18  Temp: 36.8 C 36.6 C  SpO2: 100% 91%    Last Pain:  Vitals:   07/30/18 0604  TempSrc: Oral  PainSc:                  FITZGERALD,W. EDMOND

## 2018-07-31 ENCOUNTER — Telehealth (INDEPENDENT_AMBULATORY_CARE_PROVIDER_SITE_OTHER): Payer: Self-pay | Admitting: Orthopaedic Surgery

## 2018-07-31 LAB — CBC
HCT: 32.1 % — ABNORMAL LOW (ref 36.0–46.0)
Hemoglobin: 10.7 g/dL — ABNORMAL LOW (ref 12.0–15.0)
MCH: 29.3 pg (ref 26.0–34.0)
MCHC: 33.3 g/dL (ref 30.0–36.0)
MCV: 87.9 fL (ref 80.0–100.0)
Platelets: 215 10*3/uL (ref 150–400)
RBC: 3.65 MIL/uL — ABNORMAL LOW (ref 3.87–5.11)
RDW: 13.2 % (ref 11.5–15.5)
WBC: 9.9 10*3/uL (ref 4.0–10.5)
nRBC: 0 % (ref 0.0–0.2)

## 2018-07-31 LAB — BASIC METABOLIC PANEL
Anion gap: 8 (ref 5–15)
BUN: 22 mg/dL (ref 8–23)
CO2: 26 mmol/L (ref 22–32)
Calcium: 8.3 mg/dL — ABNORMAL LOW (ref 8.9–10.3)
Chloride: 107 mmol/L (ref 98–111)
Creatinine, Ser: 1.36 mg/dL — ABNORMAL HIGH (ref 0.44–1.00)
GFR calc Af Amer: 40 mL/min — ABNORMAL LOW (ref >60)
GFR calc non Af Amer: 34 mL/min — ABNORMAL LOW (ref >60)
Glucose, Bld: 124 mg/dL — ABNORMAL HIGH (ref 70–99)
Potassium: 4.2 mmol/L (ref 3.5–5.1)
Sodium: 141 mmol/L (ref 135–145)

## 2018-07-31 MED ORDER — DOCUSATE SODIUM 100 MG PO CAPS: 100.0000 mg | ORAL_CAPSULE | Freq: Two times a day (BID) | ORAL | 0 refills | Status: AC

## 2018-07-31 MED ORDER — HYDROCODONE-ACETAMINOPHEN 5-325 MG PO TABS: 1.0000 | ORAL_TABLET | Freq: Four times a day (QID) | ORAL | 0 refills | Status: DC | PRN

## 2018-07-31 MED ORDER — POLYETHYLENE GLYCOL 3350 17 G PO PACK: 17.0000 g | PACK | Freq: Every day | ORAL | 0 refills | Status: AC | PRN

## 2018-07-31 NOTE — Telephone Encounter (Signed)
Patient has been scheduled for follow up on 08/11/2018 at 8:00am.

## 2018-07-31 NOTE — Progress Notes (Signed)
Patient will DC to: [facility] Anticipated DC date: 07/31/18 Family notified: Velna HatchetSheila Transport by: Sharin MonsPTAR  Per MD patient ready for [facility]. RN, patient, patient's family, and facility notified of DC. Discharge Summary sent to facility. RN given number for report [phone]. DC packet on chart. Ambulance transport requested for patient.  CSW signing off.  [place]ley Hill, [place]CS[place] [phone]

## 2018-07-31 NOTE — Progress Notes (Signed)
[facility] in [facility], all discharge instructions and Rx x1 given to PTAR, grandaughter at bedside.  [facility]  AKingBSNRN

## 2018-07-31 NOTE — Clinical Social Work Placement (Signed)
   CLINICAL SOCIAL WORK PLACEMENT  NOTE  Date:  07/31/2018  Patient Details  Name: Roberta BeringDorothy Fleischer MRN: [id number] Date of Birth: 03/12/1926  Clinical Social Work is seeking post-discharge placement for this patient at the Skilled  Nursing Facility level of care (*CSW will initial, date and re-position this form in  chart as items are completed):      Patient/family provided with [facility] Clinical Social Work Department's list of facilities offering this level of care within the geographic area requested by the patient (or if unable, by the patient's family).  Yes   Patient/family informed of their freedom to choose among providers that offer the needed level of care, that participate in Medicare, Medicaid or managed care program needed by the patient, have an available bed and are willing to accept the patient.      Patient/family informed of [facility] ownership interest in [facility] and [facility], as well as of the fact that they are under no obligation to receive care at these facilities.  PASRR submitted to EDS on       PASRR number received on 07/30/18     Existing PASRR number confirmed on       FL2 transmitted to all facilities in geographic area requested by pt/family on       FL2 transmitted to all facilities within larger geographic area on       Patient informed that his/her managed care company has contracts with or will negotiate with certain facilities, including the following:        Yes   Patient/family informed of bed offers received.  Patient chooses bed at Other - please specify in the comment section below:([facility])     Physician recommends and patient chooses bed at      Patient to be transferred to Other - please specify in the comment section below:([facility]) on 07/31/18.  Patient to be transferred to facility by PTAR     Patient family notified on 07/31/18 of transfer.  Name of family member notified:  Velna HatchetSheila      PHYSICIAN Please sign DNR     Additional Comment:    _______________________________________________ Gildardo GriffesAshley M Hill, LCSW 07/31/2018, 12:50 PM

## 2018-07-31 NOTE — Discharge Summary (Signed)
Physician Discharge Summary  Dezire Turk ZOX:[id number] DOB: [birth year] DOA: 07/26/2018  PCP: Patient, No Pcp Per  Admit date: 07/26/2018 Discharge date: 07/31/2018  Admitted From: Home Disposition:  SNF Discharge Condition:Stable CODE STATUS:DNR Diet recommendation: Heart Healthy  Brief/Interim Summary: Patient is a 84 year old female with past medical history of embolism to 1017, multinodular goiter, CKD stage III, severe dementia who is from memory care unit, history of CVA, chronic diastolic CHF with ejection fraction of 60% was brought out after a mechanical fall.  She was found to have displaced subcapital fracture of the left hip.  Orthopedics was consulted and she underwent  hemiarthroplasty of the left hip.  Social worker  consulted.  PT/OT recommended SNF.  She will follow-up with orthopedics as an outpatient.  Following problems were addressed during hospitalization:  Mechanical fall with displaced subcapital fracture of the left hip: Orthopedics was following.  Underwent hemiarthroplasty of the left hip. PT/OT consulted and recommended skilled nursing facility Must wear knee immobilizer to prevent hip dislocation while working with physical therapy. Restarted Eliquis for DVT prophylaxis.    History of PE: In 2017.  On Eliquis 2.5 mg twice daily at home.  Continued for DVT prophylaxis.  Chronic diastolic CHF: Ejection fraction of 60% on echocardiogram as per 2018.  Currently compensated.  Hypertension: Continue home dose beta-blocker.  Currently blood pressure stable.   Advance underlying Alzheimer's dementia: Continue home meds.  Minimize use of narcotics and benzodiazepines.  Acute kidney injury on CKD stage III: Baseline creatinine around 1.2.  Creatinine 1.36 today.  Continue to monitor as an outpatient.     Discharge Diagnoses:  Principal Problem:   Subcapital fracture of left hip (HCC) Active Problems:   CVA (cerebral vascular accident) (HCC)   COPD  (chronic obstructive pulmonary disease) (HCC)   Alzheimer's dementia without behavioral disturbance (HCC)   Essential hypertension   CKD (chronic kidney disease), stage III (HCC)   Chronic diastolic CHF (congestive heart failure) (HCC)   Pulmonary embolus (HCC)   Hip fracture [facility])    Discharge Instructions  Discharge Instructions    Diet - low sodium heart healthy   Complete by:  As directed    Discharge instructions   Complete by:  As directed    1) Follow up with orthopedics in 2 weeks.  Name and number the provider has been attached.   Increase activity slowly   Complete by:  As directed      Allergies as of 07/31/2018      Reactions   Tramadol Other (See Comments)   twitching      Medication List    TAKE these medications   acetaminophen 500 MG tablet Commonly known as:  TYLENOL Take 500 mg by mouth every 8 (eight) hours.   busPIRone 15 MG tablet Commonly known as:  BUSPAR Take 15 mg by mouth 2 (two) times daily.   citalopram 10 MG tablet Commonly known as:  CELEXA Take 10 mg by mouth daily.   divalproex 125 MG DR tablet Commonly known as:  DEPAKOTE Take 125 mg by mouth daily.   docusate sodium 100 MG capsule Commonly known as:  COLACE Take 1 capsule (100 mg total) by mouth 2 (two) times daily.   ELIQUIS 2.5 MG Tabs tablet Generic drug:  apixaban Take 2.5 mg by mouth 2 (two) times daily.   HYDROcodone-acetaminophen 5-325 MG tablet Commonly known as:  NORCO/VICODIN Take 1 tablet by mouth every 6 (six) hours as needed for severe pain.   Melatonin 3 MG  Tabs Take 3 mg by mouth at bedtime.   metoprolol succinate 50 MG 24 hr tablet Commonly known as:  TOPROL-XL Take 1 tablet (50 mg total) by mouth daily. Take with or immediately following a meal.   NAMENDA XR 14 MG Cp24 24 hr capsule Generic drug:  memantine Take 14 mg by mouth daily.   NUTRITIONAL SUPPLEMENT PO Frozen Nutritional Treat - Give by mouth three times daily after meals    ENSURE Take 120 mLs by mouth 3 (three) times daily between meals.   polyethylene glycol packet Commonly known as:  MIRALAX Take 17 g by mouth daily as needed for moderate constipation.   risperiDONE 0.25 MG tablet Commonly known as:  RISPERDAL Take 0.25 mg by mouth at bedtime.   Vitamin D (Ergocalciferol) 1.25 MG (50000 UT) Caps capsule Commonly known as:  DRISDOL Take 50,000 Units by mouth every 30 (thirty) days.      Follow-up Information    Valeria BatmanWhitfield, Peter W, MD. Schedule an appointment as soon as possible for a visit in 2 week(s).   Specialty:  Orthopedic Surgery Contact information: [address] [place] [address] [phone]          Allergies  Allergen Reactions  . Tramadol Other (See Comments)    twitching    Consultations: Orthopedics   Procedures/Studies: Dg Chest 1 View  Result Date: 07/26/2018 CLINICAL DATA:  Post unwitnessed fall. Patient reports left hip pain. EXAM: CHEST  1 VIEW COMPARISON:  Radiograph 10/29/2017 FINDINGS: Patient is rotated. Unchanged cardiomegaly and mediastinal contours allowing for rotation. Aortic atherosclerosis. No pneumothorax or focal airspace disease. No large pleural effusion. The bones are under mineralized. No acute osseous abnormalities are seen. Bilateral proximal humeral arthroplasties. IMPRESSION: Unchanged cardiomegaly and Aortic Atherosclerosis (ICD10-I70.0). No acute findings. Electronically Signed   By: Narda RutherfordMelanie  Sanford M.D.   On: 07/26/2018 06:15   Ct Head Wo Contrast  Result Date: 07/26/2018 CLINICAL DATA:  Post unwitnessed fall. Head trauma, minor, pt on anticoagulation; C-spine trauma, high clinical risk (NEXUS/CCR) EXAM: CT HEAD WITHOUT CONTRAST CT CERVICAL SPINE WITHOUT CONTRAST TECHNIQUE: Multidetector CT imaging of the head and cervical spine was performed following the standard protocol without intravenous contrast. Multiplanar CT image reconstructions of the cervical spine were also generated.  COMPARISON:  Head CT 10/29/2017. Head and cervical spine CT 09/23/2017 FINDINGS: Despite best efforts by technologist, patient motion artifact due to medical condition. CT HEAD FINDINGS Brain: Motion artifact limits assessment. Generalized atrophy and chronic small vessel ischemia. Remote infarct in the right parietal lobe. Remote lacunar infarct in the right basal ganglia. No intracranial hemorrhage, mass effect, or midline shift. No hydrocephalus. The basilar cisterns are patent. No evidence of territorial infarct or acute ischemia. Calcified meningioma versus prominent hyperextends cyst in the right frontal region, unchanged from previous. Possible calcified dural-based meningioma in the left parietal region, also unchanged. Vascular: Atherosclerosis of skullbase vasculature without hyperdense vessel or abnormal calcification. Skull: No skull fracture. Prominent right frontal hyperostosis versus partially calcified meningioma, unchanged from priors. Sinuses/Orbits: No acute findings. Bilateral cataract resection. Other: None. CT CERVICAL SPINE FINDINGS Alignment: Prominent dextroscoliotic curvature of the cervical spine, slightly progressed from prior exam. Bony fusion of C3-C4 and to lesser extent C4-C5 with straightening of normal lordosis. Chronic anterolisthesis of C7 on T1. No traumatic subluxation. Skull base and vertebrae: Motion limits assessment for acute fracture. Allowing for this, no acute fractures seen. Chronic bony ankylosis of C3-C4, vertebral bodies and posterior elements, partial bony ankylosis of C4-C5 vertebral bodies. This  is unchanged. The dens is intact. No gross skull base fracture. Soft tissues and spinal canal: No prevertebral fluid or swelling. No visible canal hematoma. Disc levels: Complete disc space loss at C3-C4 with bony ankylosis. Partial ankylosis of C4-C5 with disc space loss. Additional disc space narrowing and endplate spurring throughout the cervical spine. Upper chest:  Biapical pleuroparenchymal scarring. Probable enlarged thyroid gland again seen. Other: None. IMPRESSION: 1. Motion limits detailed evaluation, particularly through the cervical spine. 2. No acute intracranial abnormality. No skull fracture. 3. Generalized atrophy and chronic small vessel ischemia. Remote right parietal infarct. 4. Prominent multilevel degenerative change throughout the cervical spine without evidence of acute fracture or subluxation. Electronically Signed   By: Narda Rutherford M.D.   On: 07/26/2018 06:31   Ct Cervical Spine Wo Contrast  Result Date: 07/26/2018 CLINICAL DATA:  Post unwitnessed fall. Head trauma, minor, pt on anticoagulation; C-spine trauma, high clinical risk (NEXUS/CCR) EXAM: CT HEAD WITHOUT CONTRAST CT CERVICAL SPINE WITHOUT CONTRAST TECHNIQUE: Multidetector CT imaging of the head and cervical spine was performed following the standard protocol without intravenous contrast. Multiplanar CT image reconstructions of the cervical spine were also generated. COMPARISON:  Head CT 10/29/2017. Head and cervical spine CT 09/23/2017 FINDINGS: Despite best efforts by technologist, patient motion artifact due to medical condition. CT HEAD FINDINGS Brain: Motion artifact limits assessment. Generalized atrophy and chronic small vessel ischemia. Remote infarct in the right parietal lobe. Remote lacunar infarct in the right basal ganglia. No intracranial hemorrhage, mass effect, or midline shift. No hydrocephalus. The basilar cisterns are patent. No evidence of territorial infarct or acute ischemia. Calcified meningioma versus prominent hyperextends cyst in the right frontal region, unchanged from previous. Possible calcified dural-based meningioma in the left parietal region, also unchanged. Vascular: Atherosclerosis of skullbase vasculature without hyperdense vessel or abnormal calcification. Skull: No skull fracture. Prominent right frontal hyperostosis versus partially calcified  meningioma, unchanged from priors. Sinuses/Orbits: No acute findings. Bilateral cataract resection. Other: None. CT CERVICAL SPINE FINDINGS Alignment: Prominent dextroscoliotic curvature of the cervical spine, slightly progressed from prior exam. Bony fusion of C3-C4 and to lesser extent C4-C5 with straightening of normal lordosis. Chronic anterolisthesis of C7 on T1. No traumatic subluxation. Skull base and vertebrae: Motion limits assessment for acute fracture. Allowing for this, no acute fractures seen. Chronic bony ankylosis of C3-C4, vertebral bodies and posterior elements, partial bony ankylosis of C4-C5 vertebral bodies. This is unchanged. The dens is intact. No gross skull base fracture. Soft tissues and spinal canal: No prevertebral fluid or swelling. No visible canal hematoma. Disc levels: Complete disc space loss at C3-C4 with bony ankylosis. Partial ankylosis of C4-C5 with disc space loss. Additional disc space narrowing and endplate spurring throughout the cervical spine. Upper chest: Biapical pleuroparenchymal scarring. Probable enlarged thyroid gland again seen. Other: None. IMPRESSION: 1. Motion limits detailed evaluation, particularly through the cervical spine. 2. No acute intracranial abnormality. No skull fracture. 3. Generalized atrophy and chronic small vessel ischemia. Remote right parietal infarct. 4. Prominent multilevel degenerative change throughout the cervical spine without evidence of acute fracture or subluxation. Electronically Signed   By: Narda Rutherford M.D.   On: 07/26/2018 06:31   Pelvis Portable  Result Date: 07/29/2018 CLINICAL DATA:  84 y/o  F; post left hemiarthroplasty. EXAM: PORTABLE PELVIS 1-2 VIEWS COMPARISON:  02/15/2017 pelvis radiographs FINDINGS: Left proximal femur hemiarthroplasty. No periprosthetic lucency or fracture. Surrounding postsurgical changes of soft tissues with multiple small foci of air, edema, and skin staples. No new fracture or dislocation  is  evident. IMPRESSION: Inter left proximal femur hemiarthroplasty and postsurgical changes. Electronically Signed   By: Mitzi Hansen M.D.   On: 07/29/2018 15:38   Dg Hip Unilat W Or Wo Pelvis 2-3 Views Left  Result Date: 07/26/2018 CLINICAL DATA:  Left hip pain after unwitnessed fall. EXAM: DG HIP (WITH OR WITHOUT PELVIS) 2-3V LEFT COMPARISON:  Pelvis radiograph 02/15/2017 FINDINGS: Displaced left femoral neck fracture with proximal migration of the femoral shaft. Mild apex anterior angulation. Femoral head remains seated. Pubic rami are intact. No additional fracture of the pelvis. Calcifications in the pelvis may be calcified fibroids, unchanged from pelvic radiograph 02/15/2017. IMPRESSION: Displaced left femoral neck fracture with proximal migration of the femoral shaft. Electronically Signed   By: Narda Rutherford M.D.   On: 07/26/2018 06:16       Subjective: Patient seen and examined the bedside this morning.  Remains hemodynamically stable.  Stable for discharge to skilled nursing facility as soon as the bed is available.  Discharge Exam: Vitals:   07/30/18 2045 07/31/18 0424  BP: (!) 124/57 (!) 127/55  Pulse: 66 61  Resp: 19 20  Temp: 98 F (36.7 C) 98.4 F (36.9 C)  SpO2: 93% 100%   Vitals:   07/30/18 0604 07/30/18 1336 07/30/18 2045 07/31/18 0424  BP: (!) 117/57 (!) 102/54 (!) 124/57 (!) 127/55  Pulse: 84 63 66 61  Resp: 18 16 19 20   Temp: 97.8 F (36.6 C) 99 F (37.2 C) 98 F (36.7 C) 98.4 F (36.9 C)  TempSrc: Oral Oral Oral Oral  SpO2: 91% 96% 93% 100%  Weight:      Height:        General: Pt is alert, awake, not in acute distress Cardiovascular: RRR, S1/S2 +, no rubs, no gallops Respiratory: CTA bilaterally, no wheezing, no rhonchi Abdominal: Soft, NT, ND, bowel sounds + Extremities: no edema, no cyanosis,left knee immobilizer, clean surgical incision site.    The results of significant diagnostics from this hospitalization (including imaging,  microbiology, ancillary and laboratory) are listed below for reference.     Microbiology: Recent Results (from the past 240 hour(s))  MRSA PCR Screening     Status: None   Collection Time: 07/26/18  8:20 AM  Result Value Ref Range Status   MRSA by PCR NEGATIVE NEGATIVE Final    Comment:        The GeneXpert MRSA Assay (FDA approved for NASAL specimens only), is one component of a comprehensive MRSA colonization surveillance program. It is not intended to diagnose MRSA infection nor to guide or monitor treatment for MRSA infections. Performed at [facility], [address] [place], [place] [address]      Labs: BNP (last 3 results) No results for input(s): BNP in the last 8760 hours. Basic Metabolic Panel: Recent Labs  Lab 07/27/18 0508 07/28/18 0241 07/29/18 0331 07/30/18 0110 07/31/18 0147  NA 142 142 139 139 141  K 4.3 3.9 3.9 4.5 4.2  CL 105 106 104 103 107  CO2 25 24 24 24 26   GLUCOSE 116* 118* 107* 186* 124*  BUN 34* 28* 19 21 22   CREATININE 2.02* 1.73* 1.54* 1.39* 1.36*  CALCIUM 8.9 8.6* 8.6* 8.4* 8.3*  MG  --  2.0 1.8  --   --    Liver Function Tests: No results for input(s): AST, ALT, ALKPHOS, BILITOT, PROT, ALBUMIN in the last 168 hours. No results for input(s): LIPASE, AMYLASE in the last 168 hours. No results for input(s): AMMONIA  in the last 168 hours. CBC: Recent Labs  Lab 07/26/18 0440  07/27/18 0508 07/28/18 0241 07/29/18 0331 07/30/18 0110 07/31/18 0147  WBC 9.2   < > 10.6* 8.9 8.9 8.0 9.9  NEUTROABS 6.6  --   --   --   --   --   --   HGB 13.7   < > 12.7 12.4 12.7 11.5* 10.7*  HCT 46.2*   < > 40.0 38.7 40.7 35.3* 32.1*  MCV 94.5   < > 89.7 89.4 90.4 88.5 87.9  PLT 227   < > 193 173 178 186 215   < > = values in this interval not displayed.   Cardiac Enzymes: No results for input(s): CKTOTAL, CKMB, CKMBINDEX, TROPONINI in the last 168 hours. BNP: Invalid input(s): POCBNP CBG: No results for input(s): GLUCAP in the last 168  hours. D-Dimer No results for input(s): DDIMER in the last 72 hours. Hgb A1c No results for input(s): HGBA1C in the last 72 hours. Lipid Profile No results for input(s): CHOL, HDL, LDLCALC, TRIG, CHOLHDL, LDLDIRECT in the last 72 hours. Thyroid function studies No results for input(s): TSH, T4TOTAL, T3FREE, THYROIDAB in the last 72 hours.  Invalid input(s): FREET3 Anemia work up No results for input(s): VITAMINB12, FOLATE, FERRITIN, TIBC, IRON, RETICCTPCT in the last 72 hours. Urinalysis    Component Value Date/Time   COLORURINE YELLOW 10/28/2017 2327   APPEARANCEUR HAZY (A) 10/28/2017 2327   LABSPEC 1.020 10/28/2017 2327   PHURINE 5.0 10/28/2017 2327   GLUCOSEU NEGATIVE 10/28/2017 2327   HGBUR SMALL (A) 10/28/2017 2327   BILIRUBINUR NEGATIVE 10/28/2017 2327   KETONESUR 5 (A) 10/28/2017 2327   PROTEINUR 30 (A) 10/28/2017 2327   NITRITE NEGATIVE 10/28/2017 2327   LEUKOCYTESUR NEGATIVE 10/28/2017 2327   Sepsis Labs Invalid input(s): PROCALCITONIN,  WBC,  LACTICIDVEN Microbiology Recent Results (from the past 240 hour(s))  MRSA PCR Screening     Status: None   Collection Time: 07/26/18  8:20 AM  Result Value Ref Range Status   MRSA by PCR NEGATIVE NEGATIVE Final    Comment:        The GeneXpert MRSA Assay (FDA approved for NASAL specimens only), is one component of a comprehensive MRSA colonization surveillance program. It is not intended to diagnose MRSA infection nor to guide or monitor treatment for MRSA infections. Performed at [facility], [address] [place], [place] [address]     Please note: You were cared for by a hospitalist during your hospital stay. Once you are discharged, your primary care physician will handle any further medical issues. Please note that NO REFILLS for any discharge medications will be authorized once you are discharged, as it is imperative that you return to your primary care physician (or establish a relationship with a  primary care physician if you do not have one) for your post hospital discharge needs so that they can reassess your need for medications and monitor your lab values.    Time coordinating discharge: 40 minutes  SIGNED:   Burnadette PopAmrit Adhikari, MD  [facility] 07/31/2018, 9:32 AM Pager [phone]  If 7PM-7AM, please contact night-coverage [email or web address] Password [email or web address]

## 2018-07-31 NOTE — Telephone Encounter (Signed)
Vikki PortsValerie, nurse [place] [facility] called to check on patient's hospital follow-up appointment.  Vikki PortsValerie states that Roberta Griffin had hip surgery on 07/29/18 with Dr. Cleophas DunkerWhitfield and the instructions on the discharge papers say a follow-up appointment is needed in 2 weeks.  Vikki PortsValerie states Roberta Griffin will need to be transported by stretcher and requesting a return call to schedule the appointment.  Please call back at [phone] and speak with the "nurse in charge of her care."

## 2018-08-05 IMAGING — CT CT HEAD W/O CM
3 series · 15 of 47 positions shown, 18 images · non-contrast
Comparison: 02/17/2016 and CT

CLINICAL DATA: Syncopal episode

EXAM:
CT HEAD WITHOUT CONTRAST
TECHNIQUE: Contiguous axial images were obtained from the base of the skull
through the vertex without intravenous contrast.

[Series 3: head 5.0 h30s · axial · 0.44mm/px · z∈[-104,+26]mm · 9 of 32 slices shown, 12 images]
[im 3/32  brain]
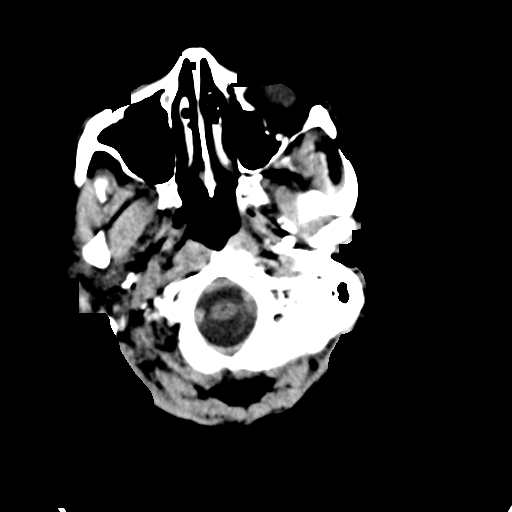
[im 3/32  bone]
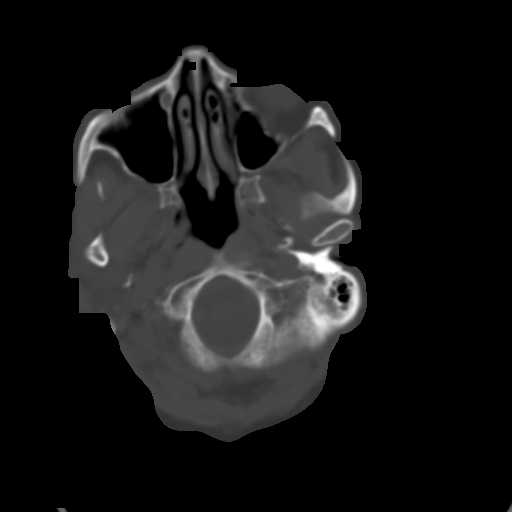
[im 6/32  brain]
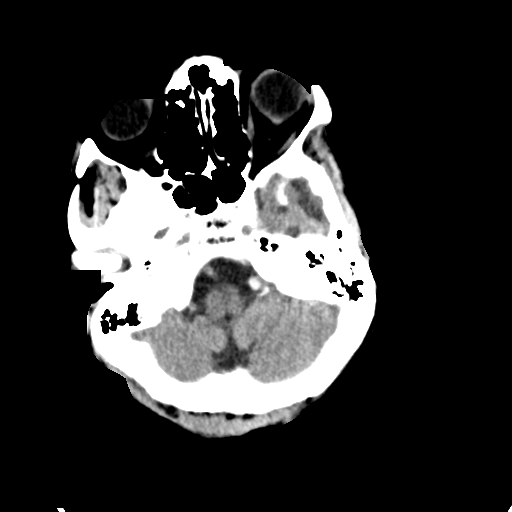
[im 9/32  brain]
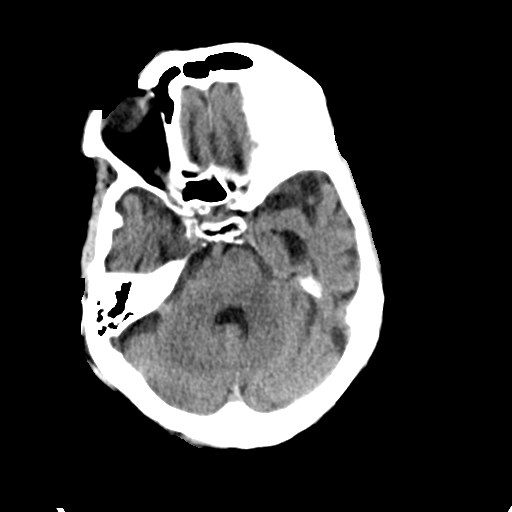
[im 12/32  brain]
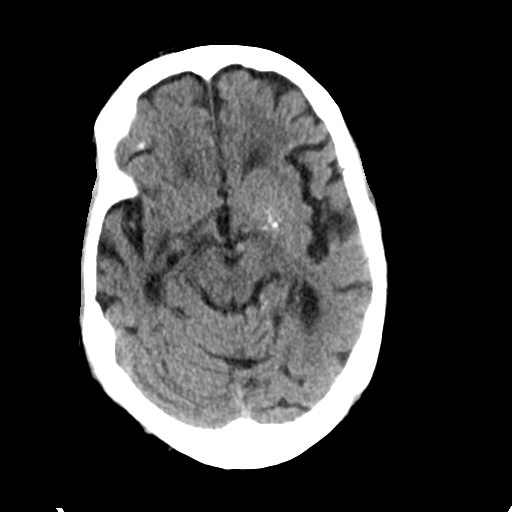
[im 17/32  brain]
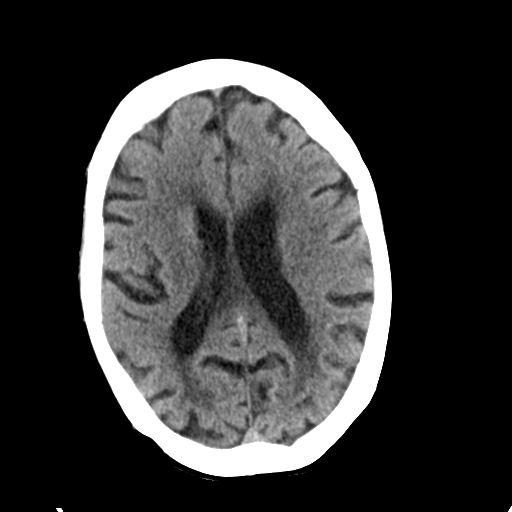
[im 17/32  bone]
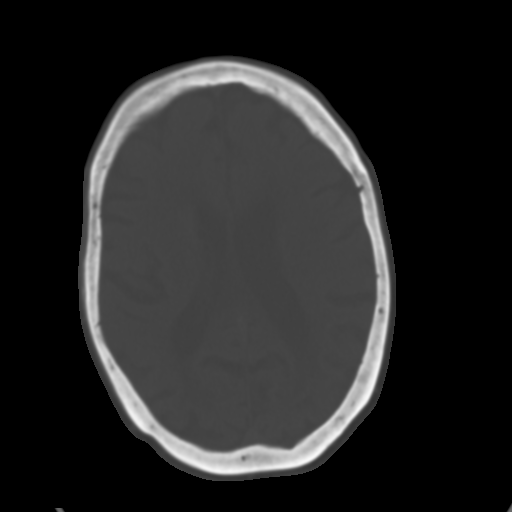
[im 20/32  brain]
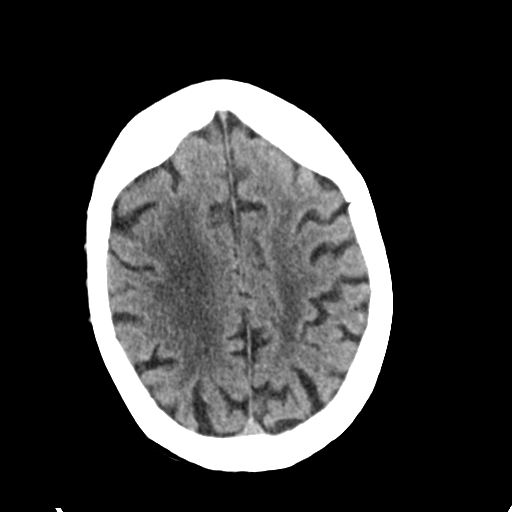
[im 23/32  brain]
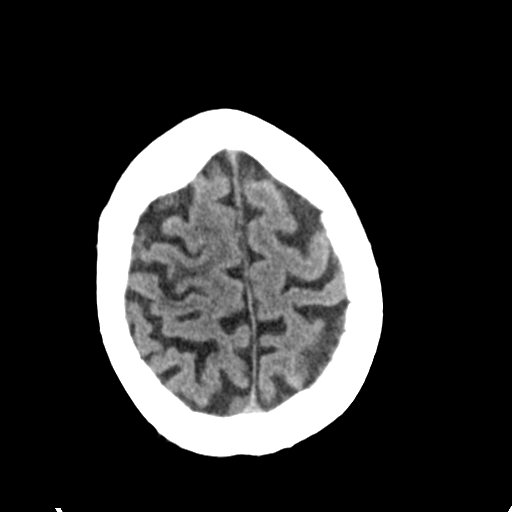
[im 26/32  brain]
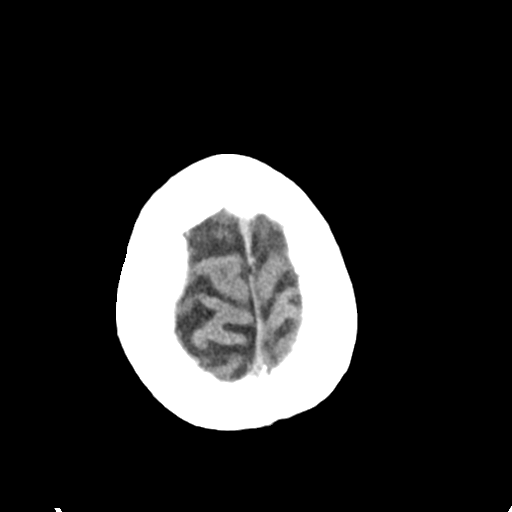
[im 29/32  brain]
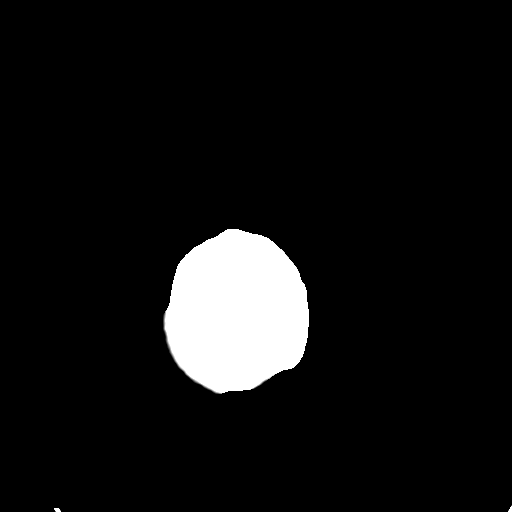
[im 29/32  bone]
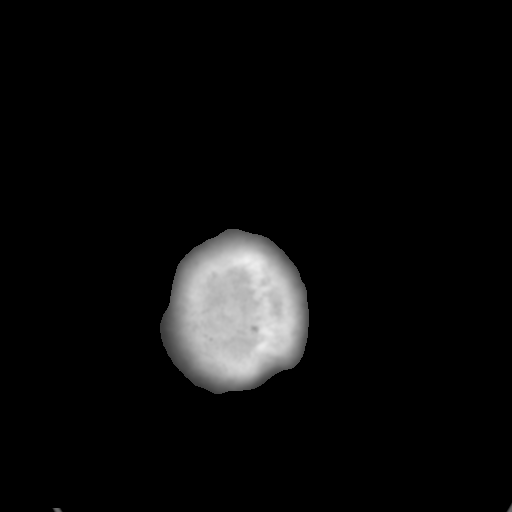

[Series 5: head 3.0 mpr cor · coronal · 0.30mm/px · 3 of 69 slices shown]
[im 23/69  brain]
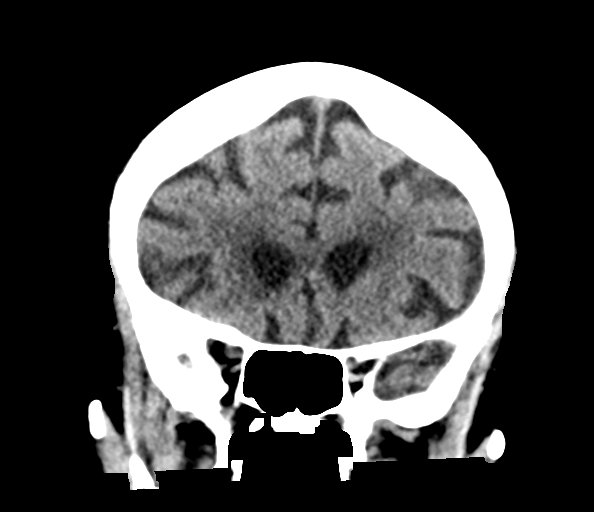
[im 31/69  brain]
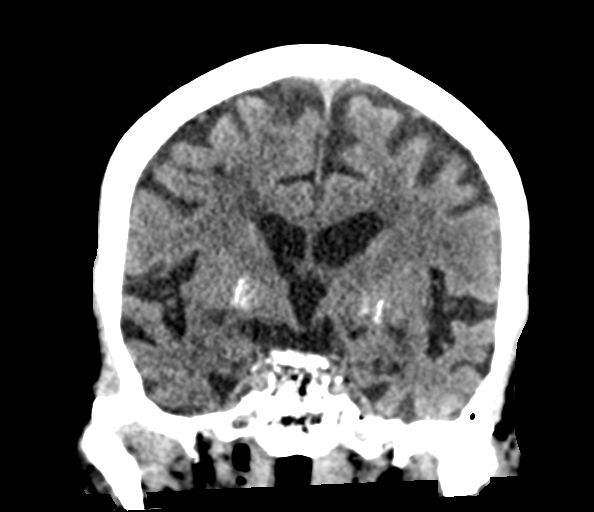
[im 38/69  brain]
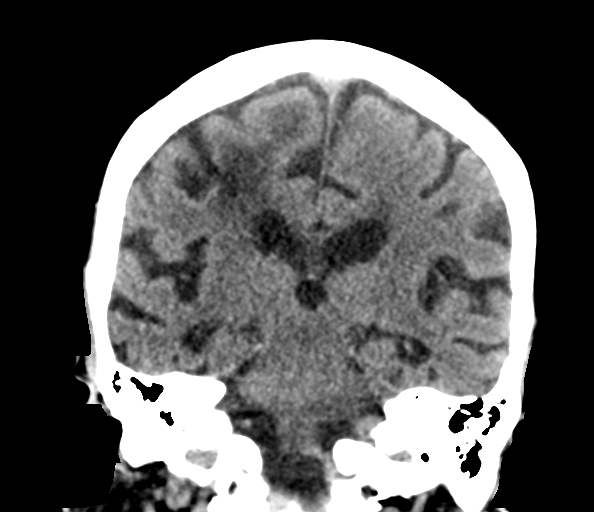

[Series 6: head 3.0 mpr sag · sagittal · 0.30mm/px · 3 of 53 slices shown]
[im 18/53  brain]
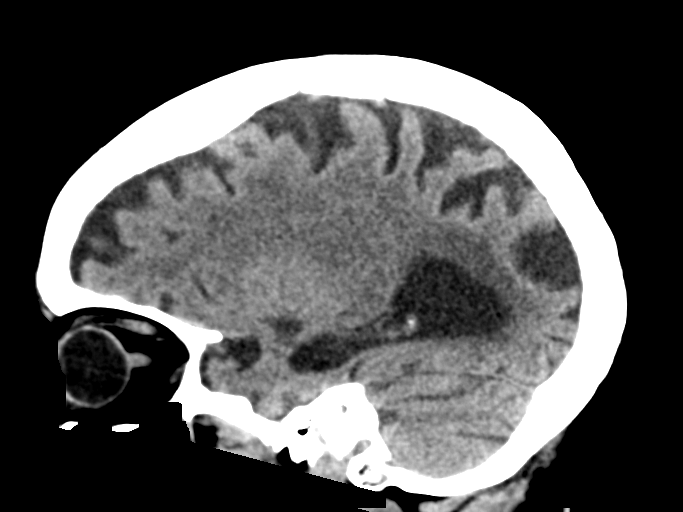
[im 27/53  brain]
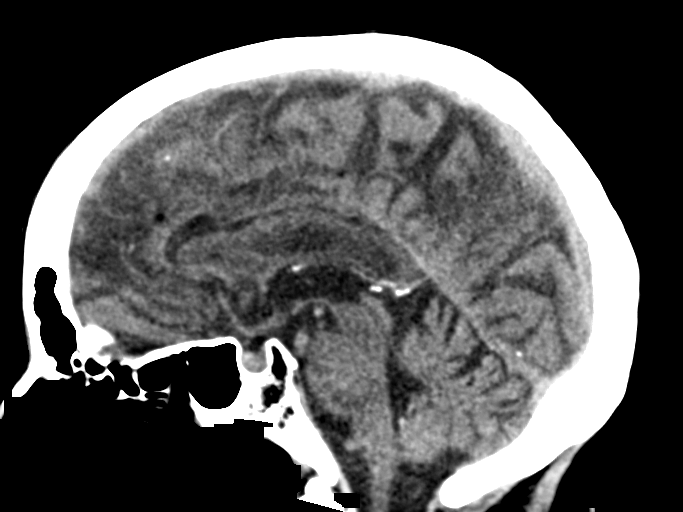
[im 35/53  brain]
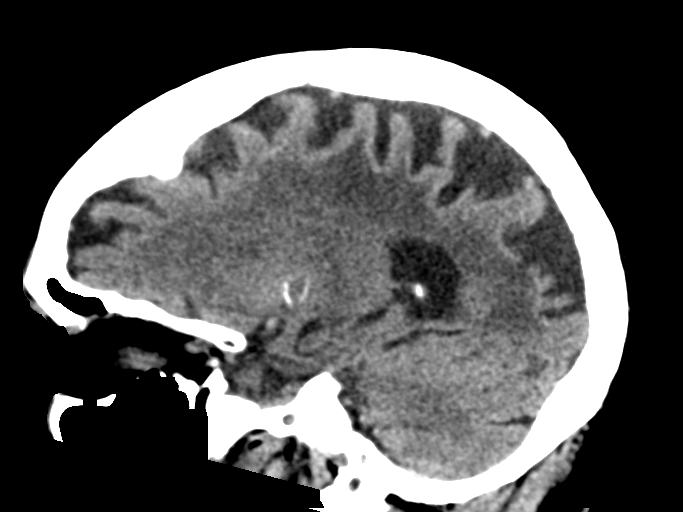

[15 of 47 positions shown; findings below may reference images not displayed]

FINDINGS: Brain: Superficial and central atrophy with moderate degree of
periventricular and subcortical white matter small vessel ischemic
disease. Chronic lacunar infarct of the right caudate head. A
idiopathic bilateral basal ganglial calcifications. No large
vascular territory infarction. No intracranial hemorrhage, midline
shift or edema. No intra-axial mass nor extra-axial fluid
collections.

Vascular: No hyperdense vessel or unexpected calcification.

Skull: Probable right frontal calcified meningioma as suggested on
prior MRI from 02/18/2016.

Sinuses/Orbits: No acute finding.

Other: None.
IMPRESSION: 1. Chronic small vessel ischemic disease. No acute intracranial
abnormality.
2. Small chronic caudate lacunar infarct on the right.
3. Cerebral atrophy.
4. Calcified frontal meningioma.

## 2018-08-11 ENCOUNTER — Encounter (INDEPENDENT_AMBULATORY_CARE_PROVIDER_SITE_OTHER): Payer: Self-pay | Admitting: Orthopaedic Surgery

## 2018-08-11 ENCOUNTER — Ambulatory Visit (INDEPENDENT_AMBULATORY_CARE_PROVIDER_SITE_OTHER): Payer: Medicare Other

## 2018-08-11 ENCOUNTER — Ambulatory Visit (INDEPENDENT_AMBULATORY_CARE_PROVIDER_SITE_OTHER): Payer: Medicare Other | Admitting: Orthopaedic Surgery

## 2018-08-11 VITALS — BP 122/88 | HR 69 | Resp 14

## 2018-08-11 DIAGNOSIS — S42122D Displaced fracture of acromial process, left shoulder, subsequent encounter for fracture with routine healing: Secondary | ICD-10-CM | POA: Diagnosis not present

## 2018-08-11 DIAGNOSIS — M25552 Pain in left hip: Secondary | ICD-10-CM

## 2018-08-11 NOTE — Progress Notes (Signed)
Office Visit Note   Patient: Roberta Griffin           Date of Birth: 04/28/1926           MRN: [id number] Visit Date: 08/11/2018              Requested by: No referring provider defined for this encounter. PCP: Patient, No Pcp Per   Assessment & Plan: Visit Diagnoses:  1. Closed displaced fracture of left acromial process with routine healing, subsequent encounter   2. Pain of left hip joint     Plan: Return to the office in 2 weeks.  Continue with the knee immobilizer.  May weight-bear as tolerated.Roberta Griffin is unable to understand directions and I am concerned about the possibility of hip dislocation.  According to the gentleman that brought her to the office she is still having some pain but there is no obvious cause.  No evidence of infection and the bipolar prosthesis is centered within the hip joint  Follow-Up Instructions: No follow-ups on file.   Orders:  Orders Placed This Encounter  Procedures  . XR HIP UNILAT W OR W/O PELVIS 2-3 VIEWS LEFT   No orders of the defined types were placed in this encounter.     Procedures: No procedures performed   Clinical Data: No additional findings.   Subjective: Chief Complaint  Patient presents with  . Follow-up  Roberta. Duffy Griffin is 84 years old and 2 weeks status post bipolar arthroplasty left hip for displaced subcapital fracture.  She has significant dementia which has not changed since the surgery.  She is transported from the nursing facility today with a knee immobilizer in place still having some pain  HPI  Review of Systems   Objective: Vital Signs: BP 122/88 (BP Location: Left Arm, Patient Position: Sitting, Cuff Size: Normal)   Pulse 69   Resp 14   Physical Exam  Ortho Exam left hip incision is healing without problem.  I removed the clips and applied Steri-Strips over benzoin.  Leg lengths appear to be symmetrical.  No obvious deformity.  Difficult to assess neurovascular status.  No distal edema.  Skin  beneath the knee immobilizer is intact  Specialty Comments:  No specialty comments available.  Imaging: No results found.   PMFS History: Patient Active Problem List   Diagnosis Date Noted  . Subcapital fracture of left hip (HCC) 07/29/2018  . Hip fracture (HCC) 07/26/2018  . Depression with anxiety 07/26/2017  . High risk medication use 06/03/2017  . Chronic pain of both shoulders 06/03/2017  . Schizophrenia (HCC) 04/26/2017  . GAD (generalized anxiety disorder) 04/05/2017  . History of stroke 12/05/2016  . Pulmonary embolus (HCC) 12/05/2016  . Chronic diastolic CHF (congestive heart failure) (HCC) 03/04/2016  . Insomnia 03/04/2016  . Dysarthria 03/04/2016  . Thyroid nodule 03/04/2016  . Pancreatic mass   . HLD (hyperlipidemia)   . Dysphagia 02/18/2016  . CVA (cerebral vascular accident) (HCC) 02/17/2016  . COPD (chronic obstructive pulmonary disease) (HCC) 02/17/2016  . Alzheimer's dementia without behavioral disturbance (HCC) 02/17/2016  . Essential hypertension 02/17/2016  . Normocytic anemia 02/17/2016  . CKD (chronic kidney disease), stage III (HCC) 02/17/2016   Past Medical History:  Diagnosis Date  . COPD (chronic obstructive pulmonary disease) (HCC)   . Dementia (HCC)   . Depression   . Dysphagia   . Hypertension   . Scoliosis     Family History  Problem Relation Age of Onset  . Stroke Neg Hx  Past Surgical History:  Procedure Laterality Date  . ABDOMINAL HYSTERECTOMY    . HIP ARTHROPLASTY Left 07/29/2018   Procedure: ARTHROPLASTY BIPOLAR HIP (HEMIARTHROPLASTY);  Surgeon: Valeria BatmanWhitfield, Peter W, MD;  [facility];  Service: Orthopedics;  Laterality: Left;  . KNEE SURGERY    . SHOULDER SURGERY    . VITRECTOMY AND CATARACT     Social History   Occupational History  . Not on file  Tobacco Use  . Smoking status: Never Smoker  . Smokeless tobacco: Never Used  Substance and Sexual Activity  . Alcohol use: No  . Drug use: No  . Sexual activity:  Not on file     Valeria BatmanPeter W Whitfield, MD   Note - This record has been created using [employer] software.  Chart creation errors have been sought, but may not always  have been located. Such creation errors do not reflect on  the standard of medical care.

## 2018-09-02 ENCOUNTER — Encounter (INDEPENDENT_AMBULATORY_CARE_PROVIDER_SITE_OTHER): Payer: Self-pay | Admitting: Orthopaedic Surgery

## 2018-09-02 ENCOUNTER — Ambulatory Visit (INDEPENDENT_AMBULATORY_CARE_PROVIDER_SITE_OTHER): Payer: Medicare Other | Admitting: Orthopaedic Surgery

## 2018-09-02 ENCOUNTER — Ambulatory Visit (INDEPENDENT_AMBULATORY_CARE_PROVIDER_SITE_OTHER): Payer: Medicare Other

## 2018-09-02 DIAGNOSIS — S72012D Unspecified intracapsular fracture of left femur, subsequent encounter for closed fracture with routine healing: Secondary | ICD-10-CM

## 2018-09-02 NOTE — Progress Notes (Signed)
Office Visit Note   Patient: Roberta Griffin           Date of Birth: 04/16/1926           MRN: [id number] Visit Date: 09/02/2018              Requested by: No referring provider defined for this encounter. PCP: Patient, No Pcp Per   Assessment & Plan: Visit Diagnoses:  1. Closed subcapital fracture of left femur with routine healing, subsequent encounter     Plan:   Follow-Up Instructions: No follow-ups on file.   Orders:  Orders Placed This Encounter  Procedures  . XR Pelvis 1-2 Views   No orders of the defined types were placed in this encounter.     Procedures: No procedures performed   Clinical Data: No additional findings.   Subjective: No chief complaint on file. 1 month status post bipolar arthroplasty for displaced subcapital fracture left hip.  Patient is presently residing in a nursing facility and is nonambulatory.  She was nonambulatory prior to surgery.  She has significant dementia.  Staff member thinks she might be having some pain but has not had any vital sign changes or temperature.  She is non communicative.  HPI  Review of Systems   Objective: Vital Signs: There were no vitals taken for this visit.  Physical Exam  Ortho Exam evaluated on stretcher.  Leg lengths are symmetrical.  No thigh or calf swelling.  Moves toes.  Left hip incision healed without any problem.  Difficult to determine if there is any pain  Specialty Comments:  No specialty comments available.  Imaging: No results found.   PMFS History: Patient Active Problem List   Diagnosis Date Noted  . Subcapital fracture of left hip (HCC) 07/29/2018  . Hip fracture (HCC) 07/26/2018  . Depression with anxiety 07/26/2017  . High risk medication use 06/03/2017  . Chronic pain of both shoulders 06/03/2017  . Schizophrenia (HCC) 04/26/2017  . GAD (generalized anxiety disorder) 04/05/2017  . History of stroke 12/05/2016  . Pulmonary embolus (HCC) 12/05/2016  . Chronic  diastolic CHF (congestive heart failure) (HCC) 03/04/2016  . Insomnia 03/04/2016  . Dysarthria 03/04/2016  . Thyroid nodule 03/04/2016  . Pancreatic mass   . HLD (hyperlipidemia)   . Dysphagia 02/18/2016  . CVA (cerebral vascular accident) (HCC) 02/17/2016  . COPD (chronic obstructive pulmonary disease) (HCC) 02/17/2016  . Alzheimer's dementia without behavioral disturbance (HCC) 02/17/2016  . Essential hypertension 02/17/2016  . Normocytic anemia 02/17/2016  . CKD (chronic kidney disease), stage III (HCC) 02/17/2016   Past Medical History:  Diagnosis Date  . COPD (chronic obstructive pulmonary disease) (HCC)   . Dementia (HCC)   . Depression   . Dysphagia   . Hypertension   . Scoliosis     Family History  Problem Relation Age of Onset  . Stroke Neg Hx     Past Surgical History:  Procedure Laterality Date  . ABDOMINAL HYSTERECTOMY    . HIP ARTHROPLASTY Left 07/29/2018   Procedure: ARTHROPLASTY BIPOLAR HIP (HEMIARTHROPLASTY);  Surgeon: Valeria Batman, MD;  Location: [facility] OR;  Service: Orthopedics;  Laterality: Left;  . KNEE SURGERY    . SHOULDER SURGERY    . VITRECTOMY AND CATARACT     Social History   Occupational History  . Not on file  Tobacco Use  . Smoking status: Never Smoker  . Smokeless tobacco: Never Used  Substance and Sexual Activity  . Alcohol use: No  . Drug  use: No  . Sexual activity: Not on file     Garald Balding, MD   Note - This record has been created using [employer].  Chart creation errors have been sought, but may not always  have been located. Such creation errors do not reflect on  the standard of medical care.

## 2018-09-20 ENCOUNTER — Emergency Department (HOSPITAL_COMMUNITY)
Admission: EM | Admit: 2018-09-20 | Discharge: 2018-09-20 | Disposition: A | Discharge status: Home / Self Care | Payer: Medicare Other | Attending: Emergency Medicine | Admitting: Emergency Medicine

## 2018-09-20 ENCOUNTER — Encounter (HOSPITAL_COMMUNITY): Payer: Self-pay

## 2018-09-20 ENCOUNTER — Emergency Department (HOSPITAL_COMMUNITY): Payer: Medicare Other

## 2018-09-20 DIAGNOSIS — N183 Chronic kidney disease, stage 3 (moderate): Secondary | ICD-10-CM | POA: Diagnosis not present

## 2018-09-20 DIAGNOSIS — J449 Chronic obstructive pulmonary disease, unspecified: Secondary | ICD-10-CM | POA: Diagnosis not present

## 2018-09-20 DIAGNOSIS — F0390 Unspecified dementia without behavioral disturbance: Secondary | ICD-10-CM | POA: Insufficient documentation

## 2018-09-20 DIAGNOSIS — R296 Repeated falls: Secondary | ICD-10-CM | POA: Insufficient documentation

## 2018-09-20 DIAGNOSIS — Z043 Encounter for examination and observation following other accident: Secondary | ICD-10-CM | POA: Insufficient documentation

## 2018-09-20 DIAGNOSIS — Z79899 Other long term (current) drug therapy: Secondary | ICD-10-CM | POA: Insufficient documentation

## 2018-09-20 DIAGNOSIS — G309 Alzheimer's disease, unspecified: Secondary | ICD-10-CM

## 2018-09-20 DIAGNOSIS — R479 Unspecified speech disturbances: Secondary | ICD-10-CM | POA: Diagnosis not present

## 2018-09-20 DIAGNOSIS — W1830XA Fall on same level, unspecified, initial encounter: Secondary | ICD-10-CM | POA: Diagnosis not present

## 2018-09-20 DIAGNOSIS — I129 Hypertensive chronic kidney disease with stage 1 through stage 4 chronic kidney disease, or unspecified chronic kidney disease: Secondary | ICD-10-CM | POA: Insufficient documentation

## 2018-09-20 DIAGNOSIS — W19XXXA Unspecified fall, initial encounter: Secondary | ICD-10-CM

## 2018-09-20 DIAGNOSIS — F0280 Dementia in other diseases classified elsewhere without behavioral disturbance: Secondary | ICD-10-CM

## 2018-09-20 LAB — CBG MONITORING, ED: Glucose-Capillary: 177 mg/dL — ABNORMAL HIGH (ref 70–99)

## 2018-09-20 NOTE — ED Provider Notes (Signed)
[facility] EMERGENCY DEPARTMENT Provider Note   CSN: [id number] Arrival date & time: 09/20/18  0103     History   Chief Complaint Chief Complaint  Patient presents with  . Fall    HPI Roberta Griffin is a 85 y.o. female.  HPI   85 year old female, with history of severe dementia, hip fracture, on blood thinner, here with unwitnessed fall.  Patient was reportedly found down, in her room, with no apparent trauma.  She is essentially nonambulatory and likely slipped from her wheelchair.  She is nonambulatory and nonverbal at baseline, which is her current mental status.  Nursing home did not report any significant trauma to the room.   Level 5 caveat invoked as remainder of history, ROS, and physical exam limited due to patient's mental status/dementia.   Past Medical History:  Diagnosis Date  . COPD (chronic obstructive pulmonary disease) (HCC)   . Dementia (HCC)   . Depression   . Dysphagia   . Hypertension   . Scoliosis     Patient Active Problem List   Diagnosis Date Noted  . Subcapital fracture of left hip (HCC) 07/29/2018  . Hip fracture (HCC) 07/26/2018  . Depression with anxiety 07/26/2017  . High risk medication use 06/03/2017  . Chronic pain of both shoulders 06/03/2017  . Schizophrenia (HCC) 04/26/2017  . GAD (generalized anxiety disorder) 04/05/2017  . History of stroke 12/05/2016  . Pulmonary embolus (HCC) 12/05/2016  . Chronic diastolic CHF (congestive heart failure) (HCC) 03/04/2016  . Insomnia 03/04/2016  . Dysarthria 03/04/2016  . Thyroid nodule 03/04/2016  . Pancreatic mass   . HLD (hyperlipidemia)   . Dysphagia 02/18/2016  . CVA (cerebral vascular accident) (HCC) 02/17/2016  . COPD (chronic obstructive pulmonary disease) (HCC) 02/17/2016  . Alzheimer's dementia without behavioral disturbance (HCC) 02/17/2016  . Essential hypertension 02/17/2016  . Normocytic anemia 02/17/2016  . CKD (chronic kidney disease), stage III (HCC)  02/17/2016    Past Surgical History:  Procedure Laterality Date  . ABDOMINAL HYSTERECTOMY    . HIP ARTHROPLASTY Left 07/29/2018   Procedure: ARTHROPLASTY BIPOLAR HIP (HEMIARTHROPLASTY);  Surgeon: Valeria Batman, MD;  Location: [facility] OR;  Service: Orthopedics;  Laterality: Left;  . KNEE SURGERY    . SHOULDER SURGERY    . VITRECTOMY AND CATARACT       OB History   No obstetric history on file.      Home Medications    Prior to Admission medications   Medication Sig Start Date End Date Taking? Authorizing Provider  acetaminophen (TYLENOL) 500 MG tablet Take 500 mg by mouth every 8 (eight) hours.  02/19/18   [provider]  apixaban (ELIQUIS) 2.5 MG TABS tablet Take 2.5 mg by mouth 2 (two) times daily.    [provider]  busPIRone (BUSPAR) 15 MG tablet Take 15 mg by mouth 2 (two) times daily. 02/06/18   [provider]  citalopram (CELEXA) 10 MG tablet Take 10 mg by mouth daily.    [provider]  divalproex (DEPAKOTE) 125 MG DR tablet Take 125 mg by mouth daily.    [provider]  docusate sodium (COLACE) 100 MG capsule Take 1 capsule (100 mg total) by mouth 2 (two) times daily. 07/31/18   Burnadette Pop, MD  ENSURE (ENSURE) Take 120 mLs by mouth 3 (three) times daily between meals. 04/21/18   [provider]  HYDROcodone-acetaminophen (NORCO/VICODIN) 5-325 MG tablet Take 1 tablet by mouth every 6 (six) hours as needed for severe  pain. 07/31/18   Burnadette PopAdhikari, Amrit, MD  Melatonin 3 MG TABS Take 3 mg by mouth at bedtime.  05/18/18   [provider]  memantine (NAMENDA XR) 14 MG CP24 24 hr capsule Take 14 mg by mouth daily.    [provider]  metoprolol succinate (TOPROL-XL) 50 MG 24 hr tablet Take 1 tablet (50 mg total) by mouth daily. Take with or immediately following a meal. 02/09/16   Mesner, Barbara CowerJason, MD  Nutritional Supplements (NUTRITIONAL SUPPLEMENT PO) Frozen Nutritional Treat - Give by mouth three times daily  after meals    [provider]  polyethylene glycol (MIRALAX) packet Take 17 g by mouth daily as needed for moderate constipation. 07/31/18   Burnadette PopAdhikari, Amrit, MD  risperiDONE (RISPERDAL) 0.25 MG tablet Take 0.25 mg by mouth at bedtime.     [provider]  Vitamin D, Ergocalciferol, (DRISDOL) 50000 units CAPS capsule Take 50,000 Units by mouth every 30 (thirty) days.     [provider]    Family History Family History  Problem Relation Age of Onset  . Stroke Neg Hx     Social History Social History   Tobacco Use  . Smoking status: Never Smoker  . Smokeless tobacco: Never Used  Substance Use Topics  . Alcohol use: No  . Drug use: No     Allergies   Tramadol   Review of Systems Review of Systems  Unable to perform ROS: Dementia     Physical Exam Updated Vital Signs BP 117/63   Pulse (!) 56   Temp 97.8 F (36.6 C) (Axillary)   Resp 12   SpO2 97%   Physical Exam Vitals signs and nursing note reviewed.  Constitutional:      Appearance: She is well-developed.     Comments: Chronically ill appearing, cachectic  HENT:     Head: Normocephalic and atraumatic.  Eyes:     Conjunctiva/sclera: Conjunctivae normal.  Neck:     Musculoskeletal: Neck supple.  Cardiovascular:     Rate and Rhythm: Normal rate and regular rhythm.     Heart sounds: Normal heart sounds. No murmur. No friction rub.  Pulmonary:     Effort: Pulmonary effort is normal. No respiratory distress.     Breath sounds: Normal breath sounds. No wheezing or rales.  Abdominal:     General: There is no distension.     Palpations: Abdomen is soft.     Tenderness: There is no abdominal tenderness.  Skin:    General: Skin is warm.     Capillary Refill: Capillary refill takes less than 2 seconds.  Neurological:     Motor: No abnormal muscle tone.     Comments: Non-verbal      ED Treatments / Results  Labs (all labs ordered are listed, but only abnormal results are  displayed) Labs Reviewed  CBG MONITORING, ED - Abnormal; Notable for the following components:      Result Value   Glucose-Capillary 177 (*)    All other components within normal limits    EKG EKG Interpretation  Date/Time:  Saturday September 20 2018 01:19:29 EST Ventricular Rate:  99 PR Interval:    QRS Duration: 172 QT Interval:  387 QTC Calculation: 497 R Axis:   -52 Text Interpretation:  Atrial fibrillation RBBB and LAFB Artifact in lead(s) I II III aVR aVL aVF V1 V2 V3 V4 V5 V6 and baseline wander in lead(s) V1 Interpretation limited due to artifact Confirmed by Shaune PollackIsaacs, Cameron [phone]([id number]) on 09/20/2018 1:30:21  AM   Radiology Ct Head Wo Contrast  Result Date: 09/20/2018 CLINICAL DATA:  Unwitnessed fall, on blood thinners EXAM: CT HEAD WITHOUT CONTRAST CT CERVICAL SPINE WITHOUT CONTRAST TECHNIQUE: Multidetector CT imaging of the head and cervical spine was performed following the standard protocol without intravenous contrast. Multiplanar CT image reconstructions of the cervical spine were also generated. COMPARISON:  07/26/2018 FINDINGS: CT HEAD FINDINGS Brain: There is atrophy and chronic small vessel disease changes. Remote right parietal infarct, stable. No acute intracranial abnormality. Specifically, no hemorrhage, hydrocephalus, mass lesion, acute infarction, or significant intracranial injury. Vascular: No hyperdense vessel or unexpected calcification. Skull: No acute calvarial abnormality. Sinuses/Orbits: Visualized paranasal sinuses and mastoids clear. Orbital soft tissues unremarkable. Other: None CT CERVICAL SPINE FINDINGS Alignment: No subluxation.  Rightward scoliosis. Skull base and vertebrae: No acute fracture. No primary bone lesion or focal pathologic process. Soft tissues and spinal canal: No prevertebral fluid or swelling. No visible canal hematoma. Disc levels: Fusion from C3-C5, likely degenerative. Diffuse degenerative disc disease and facet disease throughout the cervical  spine. Upper chest: No acute findings Other: Carotid artery calcifications. IMPRESSION: Atrophy, chronic microvascular disease. No acute intracranial abnormality. Old right parietal infarct. Degenerative disc and facet disease throughout the cervical spine. Fusion from C3-C5. No acute bony abnormality. Electronically Signed   By: Charlett Nose M.D.   On: 09/20/2018 02:21   Ct Cervical Spine Wo Contrast  Result Date: 09/20/2018 CLINICAL DATA:  Unwitnessed fall, on blood thinners EXAM: CT HEAD WITHOUT CONTRAST CT CERVICAL SPINE WITHOUT CONTRAST TECHNIQUE: Multidetector CT imaging of the head and cervical spine was performed following the standard protocol without intravenous contrast. Multiplanar CT image reconstructions of the cervical spine were also generated. COMPARISON:  07/26/2018 FINDINGS: CT HEAD FINDINGS Brain: There is atrophy and chronic small vessel disease changes. Remote right parietal infarct, stable. No acute intracranial abnormality. Specifically, no hemorrhage, hydrocephalus, mass lesion, acute infarction, or significant intracranial injury. Vascular: No hyperdense vessel or unexpected calcification. Skull: No acute calvarial abnormality. Sinuses/Orbits: Visualized paranasal sinuses and mastoids clear. Orbital soft tissues unremarkable. Other: None CT CERVICAL SPINE FINDINGS Alignment: No subluxation.  Rightward scoliosis. Skull base and vertebrae: No acute fracture. No primary bone lesion or focal pathologic process. Soft tissues and spinal canal: No prevertebral fluid or swelling. No visible canal hematoma. Disc levels: Fusion from C3-C5, likely degenerative. Diffuse degenerative disc disease and facet disease throughout the cervical spine. Upper chest: No acute findings Other: Carotid artery calcifications. IMPRESSION: Atrophy, chronic microvascular disease. No acute intracranial abnormality. Old right parietal infarct. Degenerative disc and facet disease throughout the cervical spine. Fusion  from C3-C5. No acute bony abnormality. Electronically Signed   By: Charlett Nose M.D.   On: 09/20/2018 02:21   Dg Hip Unilat W Or Wo Pelvis 2-3 Views Left  Result Date: 09/20/2018 CLINICAL DATA:  Fall EXAM: DG HIP (WITH OR WITHOUT PELVIS) 2-3V LEFT COMPARISON:  07/26/2018 FINDINGS: Prior left hip replacement. No acute bony abnormality. Specifically, no fracture, subluxation, or dislocation. Calcified fibroid centrally within the pelvis. IMPRESSION: Prior left hip replacement.  No acute bony abnormality. Electronically Signed   By: Charlett Nose M.D.   On: 09/20/2018 02:24    Procedures Procedures (including critical care time)  Medications Ordered in ED Medications - No data to display   Initial Impression / Assessment and Plan / ED Course  I have reviewed the triage vital signs and the nursing notes.  Pertinent labs & imaging results that were available during my care of the patient  were reviewed by me and considered in my medical decision making (see chart for details).     85 yo F with h/o dementia here w/ fall. History of similar episodes. She is AF, well appearing, at her neurological baseline here. No LOC. CT imaging, XR is negative. Lungs clear. No signs of skin rash, wound, lesion, or decub ulcer. Will d/c back to facility.  Final Clinical Impressions(s) / ED Diagnoses   Final diagnoses:  Fall, initial encounter  Alzheimer's dementia without behavioral disturbance, unspecified timing of dementia onset [place] [facility](HCC)    ED Discharge Orders    None       Shaune PollackIsaacs, Cameron, MD 09/20/18 [phone]

## 2018-09-20 NOTE — Discharge Instructions (Addendum)
Fortunately, Roberta Griffin CT imaging and XRays were negative  Please make sure she is in a falls risk room and is monitored closely  Encourage fluids

## 2018-09-20 NOTE — ED Notes (Signed)
Ptar called for pt 

## 2018-09-20 NOTE — ED Triage Notes (Signed)
Pt BIB [facility] from [place] d/t unwitnessed fall out of w/c. Pt was found in supine position in hallway. C-collar placed by EMS. Hx Dementia, Alzheimer on Eliquis L.Hip fract a month ago

## 2018-09-20 NOTE — ED Notes (Signed)
Patient verbalizes understanding of discharge instructions. Opportunity for questioning and answers were provided. Armband removed by staff, pt discharged from ED by [facility]. Report given to facility

## 2018-09-20 NOTE — ED Notes (Signed)
[facility] address [address], [place], [place]

## 2018-09-20 NOTE — ED Notes (Signed)
Report given to RN at [facility]. All questions answered

## 2018-10-11 ENCOUNTER — Other Ambulatory Visit: Payer: Self-pay

## 2018-10-11 ENCOUNTER — Observation Stay (HOSPITAL_COMMUNITY): Payer: Medicare Other

## 2018-10-11 ENCOUNTER — Encounter (HOSPITAL_COMMUNITY): Payer: Self-pay | Admitting: *Deleted

## 2018-10-11 ENCOUNTER — Observation Stay (HOSPITAL_COMMUNITY)
Admission: EM | Admit: 2018-10-11 | Discharge: 2018-10-12 | Disposition: A | Discharge status: Skilled Nursing Facility | Payer: Medicare Other | Attending: Family Medicine | Admitting: Family Medicine

## 2018-10-11 ENCOUNTER — Emergency Department (HOSPITAL_COMMUNITY): Payer: Medicare Other

## 2018-10-11 DIAGNOSIS — Z66 Do not resuscitate: Secondary | ICD-10-CM | POA: Diagnosis not present

## 2018-10-11 DIAGNOSIS — N183 Chronic kidney disease, stage 3 unspecified: Secondary | ICD-10-CM

## 2018-10-11 DIAGNOSIS — G4700 Insomnia, unspecified: Secondary | ICD-10-CM | POA: Insufficient documentation

## 2018-10-11 DIAGNOSIS — S50312A Abrasion of left elbow, initial encounter: Secondary | ICD-10-CM | POA: Diagnosis not present

## 2018-10-11 DIAGNOSIS — Z96642 Presence of left artificial hip joint: Secondary | ICD-10-CM | POA: Diagnosis not present

## 2018-10-11 DIAGNOSIS — D631 Anemia in chronic kidney disease: Secondary | ICD-10-CM | POA: Diagnosis not present

## 2018-10-11 DIAGNOSIS — I130 Hypertensive heart and chronic kidney disease with heart failure and stage 1 through stage 4 chronic kidney disease, or unspecified chronic kidney disease: Secondary | ICD-10-CM | POA: Diagnosis not present

## 2018-10-11 DIAGNOSIS — Z86711 Personal history of pulmonary embolism: Secondary | ICD-10-CM | POA: Insufficient documentation

## 2018-10-11 DIAGNOSIS — Z885 Allergy status to narcotic agent status: Secondary | ICD-10-CM | POA: Insufficient documentation

## 2018-10-11 DIAGNOSIS — W19XXXA Unspecified fall, initial encounter: Secondary | ICD-10-CM

## 2018-10-11 DIAGNOSIS — R54 Age-related physical debility: Secondary | ICD-10-CM | POA: Diagnosis not present

## 2018-10-11 DIAGNOSIS — T68XXXA Hypothermia, initial encounter: Principal | ICD-10-CM | POA: Diagnosis present

## 2018-10-11 DIAGNOSIS — Z7901 Long term (current) use of anticoagulants: Secondary | ICD-10-CM | POA: Insufficient documentation

## 2018-10-11 DIAGNOSIS — G309 Alzheimer's disease, unspecified: Secondary | ICD-10-CM | POA: Insufficient documentation

## 2018-10-11 DIAGNOSIS — E041 Nontoxic single thyroid nodule: Secondary | ICD-10-CM | POA: Diagnosis not present

## 2018-10-11 DIAGNOSIS — I5032 Chronic diastolic (congestive) heart failure: Secondary | ICD-10-CM | POA: Insufficient documentation

## 2018-10-11 DIAGNOSIS — J449 Chronic obstructive pulmonary disease, unspecified: Secondary | ICD-10-CM | POA: Diagnosis not present

## 2018-10-11 DIAGNOSIS — E785 Hyperlipidemia, unspecified: Secondary | ICD-10-CM | POA: Insufficient documentation

## 2018-10-11 DIAGNOSIS — Z79899 Other long term (current) drug therapy: Secondary | ICD-10-CM | POA: Insufficient documentation

## 2018-10-11 DIAGNOSIS — R296 Repeated falls: Secondary | ICD-10-CM | POA: Insufficient documentation

## 2018-10-11 DIAGNOSIS — S0083XA Contusion of other part of head, initial encounter: Secondary | ICD-10-CM | POA: Diagnosis not present

## 2018-10-11 DIAGNOSIS — E049 Nontoxic goiter, unspecified: Secondary | ICD-10-CM

## 2018-10-11 DIAGNOSIS — Y92129 Unspecified place in nursing home as the place of occurrence of the external cause: Secondary | ICD-10-CM | POA: Diagnosis not present

## 2018-10-11 DIAGNOSIS — Z888 Allergy status to other drugs, medicaments and biological substances status: Secondary | ICD-10-CM | POA: Diagnosis not present

## 2018-10-11 DIAGNOSIS — R4182 Altered mental status, unspecified: Secondary | ICD-10-CM | POA: Diagnosis not present

## 2018-10-11 DIAGNOSIS — I2699 Other pulmonary embolism without acute cor pulmonale: Secondary | ICD-10-CM | POA: Diagnosis present

## 2018-10-11 DIAGNOSIS — F329 Major depressive disorder, single episode, unspecified: Secondary | ICD-10-CM | POA: Diagnosis not present

## 2018-10-11 LAB — COMPREHENSIVE METABOLIC PANEL
ALT: 18 U/L (ref 0–44)
AST: 24 U/L (ref 15–41)
Albumin: 3.7 g/dL (ref 3.5–5.0)
Alkaline Phosphatase: 90 U/L (ref 38–126)
Anion gap: 10 (ref 5–15)
BUN: 21 mg/dL (ref 8–23)
CO2: 26 mmol/L (ref 22–32)
Calcium: 9.5 mg/dL (ref 8.9–10.3)
Chloride: 106 mmol/L (ref 98–111)
Creatinine, Ser: 1.27 mg/dL — ABNORMAL HIGH (ref 0.44–1.00)
GFR calc Af Amer: 43 mL/min — ABNORMAL LOW (ref >60)
GFR calc non Af Amer: 37 mL/min — ABNORMAL LOW (ref >60)
Glucose, Bld: 98 mg/dL (ref 70–99)
Potassium: 4.4 mmol/L (ref 3.5–5.1)
Sodium: 142 mmol/L (ref 135–145)
Total Bilirubin: 0.7 mg/dL (ref 0.3–1.2)
Total Protein: 7.1 g/dL (ref 6.5–8.1)

## 2018-10-11 LAB — CBC WITH DIFFERENTIAL/PLATELET
Abs Immature Granulocytes: 0.01 10*3/uL (ref 0.00–0.07)
Basophils Absolute: 0 10*3/uL (ref 0.0–0.1)
Basophils Relative: 1 %
Eosinophils Absolute: 0.1 10*3/uL (ref 0.0–0.5)
Eosinophils Relative: 2 %
HCT: 40.6 % (ref 36.0–46.0)
Hemoglobin: 12.5 g/dL (ref 12.0–15.0)
Immature Granulocytes: 0 %
Lymphocytes Relative: 22 %
Lymphs Abs: 1.3 10*3/uL (ref 0.7–4.0)
MCH: 28.2 pg (ref 26.0–34.0)
MCHC: 30.8 g/dL (ref 30.0–36.0)
MCV: 91.6 fL (ref 80.0–100.0)
Monocytes Absolute: 0.6 10*3/uL (ref 0.1–1.0)
Monocytes Relative: 10 %
Neutro Abs: 3.7 10*3/uL (ref 1.7–7.7)
Neutrophils Relative %: 65 %
Platelets: 236 10*3/uL (ref 150–400)
RBC: 4.43 MIL/uL (ref 3.87–5.11)
RDW: 14 % (ref 11.5–15.5)
WBC: 5.7 10*3/uL (ref 4.0–10.5)
nRBC: 0 % (ref 0.0–0.2)

## 2018-10-11 LAB — LACTIC ACID, PLASMA: Lactic Acid, Venous: 1.3 mmol/L (ref 0.5–1.9)

## 2018-10-11 LAB — TROPONIN I: Troponin I: <0.03 ng/mL (ref <0.03)

## 2018-10-11 LAB — CK: Total CK: 121 U/L (ref 38–234)

## 2018-10-11 LAB — CORTISOL: Cortisol, Plasma: 4.8 ug/dL

## 2018-10-11 LAB — CBG MONITORING, ED: Glucose-Capillary: 100 mg/dL — ABNORMAL HIGH (ref 70–99)

## 2018-10-11 MED ORDER — MIRTAZAPINE 7.5 MG PO TABS
7.5000 mg | ORAL_TABLET | Freq: Every day | ORAL | Status: DC
  Administered 2018-10-12: 7.5 mg via ORAL
  Filled 2018-10-11 (×2): qty 1

## 2018-10-11 MED ORDER — ENSURE ENLIVE PO LIQD
120.0000 mL | Freq: Three times a day (TID) | ORAL | Status: DC
  Administered 2018-10-12 (×2): 120 mL via ORAL

## 2018-10-11 MED ORDER — MELATONIN 3 MG PO TABS
3.0000 mg | ORAL_TABLET | Freq: Every day | ORAL | Status: DC
  Administered 2018-10-12: 3 mg via ORAL
  Filled 2018-10-11 (×2): qty 1

## 2018-10-11 MED ORDER — ENOXAPARIN SODIUM 30 MG/0.3ML ~~LOC~~ SOLN: 30.0000 mg | SUBCUTANEOUS | Status: DC

## 2018-10-11 MED ORDER — DOCUSATE SODIUM 100 MG PO CAPS
100.0000 mg | ORAL_CAPSULE | Freq: Two times a day (BID) | ORAL | Status: DC
  Administered 2018-10-12: 100 mg via ORAL
  Filled 2018-10-11: qty 1

## 2018-10-11 MED ORDER — ACETAMINOPHEN 325 MG PO TABS: 650.0000 mg | ORAL_TABLET | Freq: Four times a day (QID) | ORAL | Status: DC | PRN

## 2018-10-11 MED ORDER — BUSPIRONE HCL 10 MG PO TABS
15.0000 mg | ORAL_TABLET | Freq: Two times a day (BID) | ORAL | Status: DC
  Administered 2018-10-12 (×2): 15 mg via ORAL
  Filled 2018-10-11 (×2): qty 1.5
  Filled 2018-10-11: qty 3
  Filled 2018-10-11: qty 1.5
  Filled 2018-10-11: qty 3

## 2018-10-11 MED ORDER — POLYETHYLENE GLYCOL 3350 17 G PO PACK: 17.0000 g | PACK | Freq: Every day | ORAL | Status: DC | PRN

## 2018-10-11 MED ORDER — APIXABAN 2.5 MG PO TABS
2.5000 mg | ORAL_TABLET | Freq: Two times a day (BID) | ORAL | Status: DC
  Administered 2018-10-12 (×2): 2.5 mg via ORAL
  Filled 2018-10-11 (×2): qty 1

## 2018-10-11 MED ORDER — MEMANTINE HCL ER 14 MG PO CP24
14.0000 mg | ORAL_CAPSULE | Freq: Every day | ORAL | Status: DC
  Filled 2018-10-11 (×2): qty 1

## 2018-10-11 MED ORDER — DIVALPROEX SODIUM 125 MG PO DR TAB
125.0000 mg | DELAYED_RELEASE_TABLET | Freq: Every day | ORAL | Status: DC
  Administered 2018-10-12: 125 mg via ORAL
  Filled 2018-10-11: qty 1

## 2018-10-11 MED ORDER — CITALOPRAM HYDROBROMIDE 10 MG PO TABS
10.0000 mg | ORAL_TABLET | Freq: Every day | ORAL | Status: DC
  Administered 2018-10-12: 10 mg via ORAL
  Filled 2018-10-11: qty 1

## 2018-10-11 MED ORDER — RISPERIDONE 0.25 MG PO TABS
0.2500 mg | ORAL_TABLET | Freq: Every day | ORAL | Status: DC
  Administered 2018-10-12: 0.25 mg via ORAL
  Filled 2018-10-11 (×2): qty 1

## 2018-10-11 MED ORDER — ACETAMINOPHEN 650 MG RE SUPP: 650.0000 mg | Freq: Four times a day (QID) | RECTAL | Status: DC | PRN

## 2018-10-11 MED ORDER — SODIUM CHLORIDE 0.9 % IV SOLN
INTRAVENOUS | Status: DC
  Administered 2018-10-12: 01:00:00 via INTRAVENOUS

## 2018-10-11 NOTE — ED Provider Notes (Signed)
[facility] Provider Note   CSN: [id number] Arrival date & time: 10/11/18  1810    History   Chief Complaint Chief Complaint  Patient presents with  . Fall    HPI Roberta Griffin is a 85 y.o. female who comes in by EMS for unwitnessed fall. There is a level 5 caveat due to AMS. The patient denies falling and states that she was just sitting on the ground changing her shoes. The patient was apparently found down by staff and was down for an unknown amount of time. Staff noted a hematoma to the left temple, abrasion to the left elbow. She is at her baseline.      HPI  Past Medical History:  Diagnosis Date  . COPD (chronic obstructive pulmonary disease) (HCC)   . Dementia (HCC)   . Depression   . Dysphagia   . Hypertension   . Scoliosis     Patient Active Problem List   Diagnosis Date Noted  . Subcapital fracture of left hip (HCC) 07/29/2018  . Hip fracture (HCC) 07/26/2018  . Depression with anxiety 07/26/2017  . High risk medication use 06/03/2017  . Chronic pain of both shoulders 06/03/2017  . Schizophrenia (HCC) 04/26/2017  . GAD (generalized anxiety disorder) 04/05/2017  . History of stroke 12/05/2016  . Pulmonary embolus (HCC) 12/05/2016  . Chronic diastolic CHF (congestive heart failure) (HCC) 03/04/2016  . Insomnia 03/04/2016  . Dysarthria 03/04/2016  . Thyroid nodule 03/04/2016  . Pancreatic mass   . HLD (hyperlipidemia)   . Dysphagia 02/18/2016  . CVA (cerebral vascular accident) (HCC) 02/17/2016  . COPD (chronic obstructive pulmonary disease) (HCC) 02/17/2016  . Alzheimer's dementia without behavioral disturbance (HCC) 02/17/2016  . Essential hypertension 02/17/2016  . Normocytic anemia 02/17/2016  . CKD (chronic kidney disease), stage III (HCC) 02/17/2016    Past Surgical History:  Procedure Laterality Date  . ABDOMINAL HYSTERECTOMY    . HIP ARTHROPLASTY Left 07/29/2018   Procedure: ARTHROPLASTY BIPOLAR HIP  (HEMIARTHROPLASTY);  Surgeon: Valeria BatmanWhitfield, Peter W, MD;  [facility];  Service: Orthopedics;  Laterality: Left;  . KNEE SURGERY    . SHOULDER SURGERY    . VITRECTOMY AND CATARACT       OB History   No obstetric history on file.      Home Medications    Prior to Admission medications   Medication Sig Start Date End Date Taking? Authorizing Provider  acetaminophen (TYLENOL) 500 MG tablet Take 500 mg by mouth every 8 (eight) hours.  02/19/18   [provider]  apixaban (ELIQUIS) 2.5 MG TABS tablet Take 2.5 mg by mouth 2 (two) times daily.    [provider]  busPIRone (BUSPAR) 15 MG tablet Take 15 mg by mouth 2 (two) times daily. 02/06/18   [provider]  citalopram (CELEXA) 10 MG tablet Take 10 mg by mouth daily.    [provider]  divalproex (DEPAKOTE) 125 MG DR tablet Take 125 mg by mouth daily.    [provider]  docusate sodium (COLACE) 100 MG capsule Take 1 capsule (100 mg total) by mouth 2 (two) times daily. 07/31/18   Burnadette PopAdhikari, Amrit, MD  ENSURE (ENSURE) Take 120 mLs by mouth 3 (three) times daily between meals. 04/21/18   [provider]  HYDROcodone-acetaminophen (NORCO/VICODIN) 5-325 MG tablet Take 1 tablet by mouth every 6 (six) hours as needed for severe pain. 07/31/18   Burnadette PopAdhikari, Amrit, MD  Melatonin 3 MG TABS Take 3 mg by mouth  at bedtime.  05/18/18   [provider]  memantine (NAMENDA XR) 14 MG CP24 24 hr capsule Take 14 mg by mouth daily.    [provider]  metoprolol succinate (TOPROL-XL) 50 MG 24 hr tablet Take 1 tablet (50 mg total) by mouth daily. Take with or immediately following a meal. 02/09/16   Mesner, Barbara Cower, MD  Nutritional Supplements (NUTRITIONAL SUPPLEMENT PO) Frozen Nutritional Treat - Give by mouth three times daily after meals    [provider]  polyethylene glycol (MIRALAX) packet Take 17 g by mouth daily as needed for moderate constipation. 07/31/18   Burnadette Pop, MD    risperiDONE (RISPERDAL) 0.25 MG tablet Take 0.25 mg by mouth at bedtime.     [provider]  Vitamin D, Ergocalciferol, (DRISDOL) 50000 units CAPS capsule Take 50,000 Units by mouth every 30 (thirty) days.     [provider]    Family History Family History  Problem Relation Age of Onset  . Stroke Neg Hx     Social History Social History   Tobacco Use  . Smoking status: Never Smoker  . Smokeless tobacco: Never Used  Substance Use Topics  . Alcohol use: No  . Drug use: No     Allergies   Tramadol   Review of Systems Review of Systems Ten systems reviewed and are negative for acute change, except as noted in the HPI.    Physical Exam Updated Vital Signs There were no vitals taken for this visit.  Physical Exam Vitals signs and nursing note reviewed.  Constitutional:      General: She is not in acute distress.    Appearance: She is well-developed. She is not diaphoretic.  HENT:     Head: Normocephalic and atraumatic.     Comments: Hematoma to the left temple Eyes:     General: No scleral icterus.    Conjunctiva/sclera: Conjunctivae normal.  Neck:     Musculoskeletal: Normal range of motion.  Cardiovascular:     Rate and Rhythm: Normal rate and regular rhythm.     Heart sounds: Normal heart sounds. No murmur. No friction rub. No gallop.   Pulmonary:     Effort: Pulmonary effort is normal. No respiratory distress.     Breath sounds: Normal breath sounds.  Abdominal:     General: Bowel sounds are normal. There is no distension.     Palpations: Abdomen is soft. There is no mass.     Tenderness: There is no abdominal tenderness. There is no guarding.  Skin:    General: Skin is warm and dry.     Comments: Left elbow skin tear  Neurological:     Mental Status: She is alert and oriented to person, place, and time.  Psychiatric:        Behavior: Behavior normal.      ED Treatments / Results  Labs (all labs ordered are listed, but only  abnormal results are displayed) Labs Reviewed  COMPREHENSIVE METABOLIC PANEL  CBC WITH DIFFERENTIAL/PLATELET  CK  LACTIC ACID, PLASMA    EKG EKG Interpretation  Date/Time:  Sunday October 12 2018 00:05:43 EST Ventricular Rate:  66 PR Interval:    QRS Duration: 142 QT Interval:  471 QTC Calculation: 494 R Axis:   102 Text Interpretation:  Sinus rhythm Right bundle branch block since last tracing no significant change Confirmed by Rolan Bucco [phone]) on 10/12/2018 12:08:30 AM   Radiology No results found.  Procedures Procedures (including critical care time)  Medications  Ordered in ED Medications - No data to display   Initial Impression / Assessment and Plan / ED Course  I have reviewed the triage vital signs and the nursing notes.  Pertinent labs & imaging results that were available during my care of the patient were reviewed by me and considered in my medical decision making (see chart for details).       85 year old female with fall EKG shows no significant abnormalities.  Patient persistently hypothermic despite warming efforts and will need admission.  CT head and C-spine are negative for acute abnormality.  I personally reviewed images including the left elbow x-ray which are without acute fracture or dislocation.  Final Clinical Impressions(s) / ED Diagnoses   Final diagnoses:  Hypothermia, initial encounter  Fall, initial encounter  Goiter  Fall  Fall    ED Discharge Orders    None       Arthor Captain, PA-C 10/12/18 0041    Rolan Bucco, MD 10/12/18 [phone]

## 2018-10-11 NOTE — H&P (Signed)
History and Physical    Roberta Griffin ZOX:[id number] DOB: [birth year] DOA: 10/11/2018  PCP: Patient, No Pcp Per Patient coming from: [place] nursing home  Chief Complaint: Unwitnessed fall  HPI: Roberta Griffin is a 85 y.o. female with medical history significant of dementia, COPD, depression, hypertension presenting to the hospital via EMS for evaluation of an unwitnessed fall at her nursing home.  Patient has dementia and is oriented to self only.  Appears comfortable and has no complaints.  No history could be obtained from her.  I tried calling the patient's nursing home to better understand what happened but was not able to reach her nurse.  Nursing home staff confirmed that patient is DNR.  Per ED provider conversation with EMS, patient was found down by staff and was down for an unknown amount of time.  Staff noted a hematoma to the left temple and abrasion to the left elbow.  Her mental status is at her baseline.  Review of Systems: As per HPI otherwise 10 point review of systems negative.  Past Medical History:  Diagnosis Date  . COPD (chronic obstructive pulmonary disease) (HCC)   . Dementia (HCC)   . Depression   . Dysphagia   . Hypertension   . Scoliosis     Past Surgical History:  Procedure Laterality Date  . ABDOMINAL HYSTERECTOMY    . HIP ARTHROPLASTY Left 07/29/2018   Procedure: ARTHROPLASTY BIPOLAR HIP (HEMIARTHROPLASTY);  Surgeon: Valeria Batman, MD;  Location: [facility] OR;  Service: Orthopedics;  Laterality: Left;  . KNEE SURGERY    . SHOULDER SURGERY    . VITRECTOMY AND CATARACT       reports that she has never smoked. She has never used smokeless tobacco. She reports that she does not drink alcohol or use drugs.  Allergies  Allergen Reactions  . Tramadol Other (See Comments)    twitching    Family History  Problem Relation Age of Onset  . Stroke Neg Hx     Prior to Admission medications   Medication Sig Start Date End Date Taking? Authorizing  Provider  acetaminophen (TYLENOL) 500 MG tablet Take 500 mg by mouth every 8 (eight) hours.  02/19/18  Yes [provider]  apixaban (ELIQUIS) 2.5 MG TABS tablet Take 2.5 mg by mouth 2 (two) times daily.   Yes [provider]  busPIRone (BUSPAR) 15 MG tablet Take 15 mg by mouth 2 (two) times daily. 02/06/18  Yes [provider]  citalopram (CELEXA) 10 MG tablet Take 10 mg by mouth daily.   Yes [provider]  divalproex (DEPAKOTE) 125 MG DR tablet Take 125 mg by mouth daily.   Yes [provider]  docusate sodium (COLACE) 100 MG capsule Take 1 capsule (100 mg total) by mouth 2 (two) times daily. 07/31/18  Yes Adhikari, Willia Craze, MD  ENSURE (ENSURE) Take 120 mLs by mouth 3 (three) times daily between meals. 04/21/18  Yes [provider]  HYDROcodone-acetaminophen (NORCO/VICODIN) 5-325 MG tablet Take 1 tablet by mouth every 6 (six) hours as needed for severe pain. 07/31/18  Yes Burnadette Pop, MD  Melatonin 3 MG TABS Take 3 mg by mouth at bedtime.  05/18/18  Yes [provider]  memantine (NAMENDA XR) 14 MG CP24 24 hr capsule Take 14 mg by mouth daily.   Yes [provider]  metoprolol succinate (TOPROL-XL) 50 MG 24 hr tablet Take 1 tablet (50 mg total) by mouth daily. Take with or immediately following a meal. Patient taking differently:  Take 25 mg by mouth daily. Take with or immediately following a meal. 02/09/16  Yes Mesner, Barbara Cower, MD  mirtazapine (REMERON) 7.5 MG tablet Take 7.5 mg by mouth at bedtime. 10/09/18  Yes [provider]  Nutritional Supplements (NUTRITIONAL SUPPLEMENT PO) Take 1 Can by mouth 3 (three) times daily.    Yes [provider]  polyethylene glycol (MIRALAX) packet Take 17 g by mouth daily as needed for moderate constipation. 07/31/18  Yes Burnadette Pop, MD  risperiDONE (RISPERDAL) 0.25 MG tablet Take 0.25 mg by mouth at bedtime.    Yes [provider]  Vitamin D, Ergocalciferol,  (DRISDOL) 50000 units CAPS capsule Take 50,000 Units by mouth every 30 (thirty) days.    Yes [provider]    Physical Exam: Vitals:   10/11/18 2224 10/11/18 2230 10/11/18 2300 10/11/18 2315  BP:  (!) 125/58 (!) 135/106   Pulse:  63 81 60  Resp:      Temp: (!) 96.5 F (35.8 C)     TempSrc: Rectal     SpO2:  100% 90% (!) 86%  Weight:      Height:        Physical Exam  Constitutional: No distress.  HENT:  Head: Normocephalic.  Eyes: Right eye exhibits no discharge. Left eye exhibits no discharge.  Left pupil reactive to light.  Right eye cataract.  Difficult to assess pupillary reaction due to lack of patient cooperation.  Neck: Neck supple.  Cardiovascular: Normal rate, regular rhythm and intact distal pulses.  Pulmonary/Chest: Effort normal. No respiratory distress. She has no wheezes. She has no rales.  Equal air entry bilaterally upon auscultation of anterior lung fields  Abdominal: Soft. Bowel sounds are normal. She exhibits no distension. There is no abdominal tenderness. There is no rebound and no guarding.  Musculoskeletal:        General: No edema.  Neurological:  Awake and alert Oriented to self only Speech fluent, tongue midline, no facial droop. Strength 5 out of 5 in bilateral upper and lower extremities.  Skin: Skin is warm and dry. She is not diaphoretic.     Labs on Admission: I have personally reviewed following labs and imaging studies  CBC: Recent Labs  Lab 10/11/18 1847  WBC 5.7  NEUTROABS 3.7  HGB 12.5  HCT 40.6  MCV 91.6  PLT 236   Basic Metabolic Panel: Recent Labs  Lab 10/11/18 1847  NA 142  K 4.4  CL 106  CO2 26  GLUCOSE 98  BUN 21  CREATININE 1.27*  CALCIUM 9.5   GFR: Estimated Creatinine Clearance: 25.3 mL/min (A) (by C-G formula based on SCr of 1.27 mg/dL (H)). Liver Function Tests: Recent Labs  Lab 10/11/18 1847  AST 24  ALT 18  ALKPHOS 90  BILITOT 0.7  PROT 7.1  ALBUMIN 3.7   No results for input(s):  LIPASE, AMYLASE in the last 168 hours. No results for input(s): AMMONIA in the last 168 hours. Coagulation Profile: No results for input(s): INR, PROTIME in the last 168 hours. Cardiac Enzymes: Recent Labs  Lab 10/11/18 1847 10/11/18 2305  CKTOTAL 121  --   TROPONINI  --  <0.03   BNP (last 3 results) No results for input(s): PROBNP in the last 8760 hours. HbA1C: No results for input(s): HGBA1C in the last 72 hours. CBG: Recent Labs  Lab 10/11/18 1840  GLUCAP 100*   Lipid Profile: No results for input(s): CHOL, HDL, LDLCALC, TRIG, CHOLHDL, LDLDIRECT in the last 72 hours. Thyroid  Function Tests: Recent Labs    10/11/18 2305  FREET4 0.91   Anemia Panel: No results for input(s): VITAMINB12, FOLATE, FERRITIN, TIBC, IRON, RETICCTPCT in the last 72 hours. Urine analysis:    Component Value Date/Time   COLORURINE YELLOW 10/28/2017 2327   APPEARANCEUR HAZY (A) 10/28/2017 2327   LABSPEC 1.020 10/28/2017 2327   PHURINE 5.0 10/28/2017 2327   GLUCOSEU NEGATIVE 10/28/2017 2327   HGBUR SMALL (A) 10/28/2017 2327   BILIRUBINUR NEGATIVE 10/28/2017 2327   KETONESUR 5 (A) 10/28/2017 2327   PROTEINUR 30 (A) 10/28/2017 2327   NITRITE NEGATIVE 10/28/2017 2327   LEUKOCYTESUR NEGATIVE 10/28/2017 2327    Radiological Exams on Admission: Dg Chest 2 View  Result Date: 10/12/2018 CLINICAL DATA:  85 year old female with fall. EXAM: CHEST - 2 VIEW COMPARISON:  Chest radiograph dated 07/26/2018 FINDINGS: There is no focal consolidation, pleural effusion, or pneumothorax. There is mild eventration of the right hemidiaphragm. Stable cardiomegaly. Atherosclerotic calcification of the aorta. Ovoid soft tissue density to the left of the trachea with deviation of the trachea to the right similar to prior radiograph noted. This is most consistent with a left thyroid nodule also seen on the CT of 02/13/2017. Bilateral shoulder arthroplasties. No acute osseous pathology. Osteopenia and degenerative changes  of the spine. IMPRESSION: 1. No acute cardiopulmonary process. 2. Stable cardiomegaly. 3. Left thyroid nodule. Electronically Signed   By: Elgie Collard M.D.   On: 10/12/2018 00:02   Dg Elbow 2 Views Left  Result Date: 10/11/2018 CLINICAL DATA:  Pt has AMS; fell on left elbow. Pt has a small cut on elbow EXAM: LEFT ELBOW - 2 VIEW COMPARISON:  02/15/2017 FINDINGS: Soft tissue irregularity along the LATERAL aspect of the elbow. There is no acute fracture or subluxation. No evidence for joint effusion. No radiopaque foreign body. IMPRESSION: 1. Soft tissue injury. 2. No evidence for acute osseous abnormality. Electronically Signed   By: Norva Pavlov M.D.   On: 10/11/2018 20:01   Ct Head Wo Contrast  Result Date: 10/11/2018 CLINICAL DATA:  Head trauma. Patient is anticoagulated. EXAM: CT HEAD WITHOUT CONTRAST CT CERVICAL SPINE WITHOUT CONTRAST TECHNIQUE: Multidetector CT imaging of the head and cervical spine was performed following the standard protocol without intravenous contrast. Multiplanar CT image reconstructions of the cervical spine were also generated. COMPARISON:  None. CT head and cervical spine 09/20/2018 FINDINGS: CT HEAD FINDINGS Brain: Diffuse cerebral atrophy. Ventricular dilatation consistent with central atrophy. Low-attenuation changes throughout the deep white matter consistent small vessel ischemia. Basal ganglia calcifications. No mass effect or midline shift. No abnormal extra-axial fluid collections. Gray-white matter junctions are distinct. Basal cisterns are not effaced. No acute intracranial hemorrhage. Vascular: Moderate intracranial arterial calcifications. Skull: Calvarium appears intact. No acute depressed skull fractures. Sinuses/Orbits: Mucosal thickening in the left sphenoid sinus. Paranasal sinuses and mastoid air cells are otherwise clear. Other: No significant change since prior study. CT CERVICAL SPINE FINDINGS Alignment: Alignment of the cervical spine is unchanged  since previous study. Congenital or posttraumatic fusion of C3-C4 with fixed anterior placement of C3 on C4. This is unchanged. Slight anterior subluxation of C6 on C7, also unchanged and likely degenerative. Normal alignment of the facet joints. Cervical spine is convex towards the right. This is unchanged since prior study and likely related to degenerative or posttraumatic changes. C1-2 articulation appears intact. Skull base and vertebrae: Skull base appears intact. No vertebral compression deformities. No focal bone lesion or bone destruction. Soft tissues and spinal canal: No prevertebral soft tissue  swelling. No abnormal paraspinal soft tissue mass or infiltration. Vascular calcifications. Disc levels: Fusion of C3-C4. Degenerative changes otherwise with narrowed interspaces and mild endplate hypertrophic changes. Upper chest: Mild scarring in the lung apices. Large left thyroid nodule, likely goiter, measuring 3.5 cm diameter. This is poorly visualized due to streak artifact but has been present on previous studies. Other: None. IMPRESSION: 1. No acute intracranial abnormalities. Chronic atrophy and small vessel ischemic changes. 2. Alignment of the cervical spine is unchanged since previous study. Congenital or posttraumatic fusion of C3-C4. Slight anterior subluxation of C6 on C7 is also unchanged and likely degenerative. Cervical spine convex towards the right, also unchanged and likely degenerative. No acute displaced fractures identified. 3. Large left thyroid nodule, likely goiter. Electronically Signed   By: Burman Nieves M.D.   On: 10/11/2018 20:21   Ct Cervical Spine Wo Contrast  Result Date: 10/11/2018 CLINICAL DATA:  Head trauma. Patient is anticoagulated. EXAM: CT HEAD WITHOUT CONTRAST CT CERVICAL SPINE WITHOUT CONTRAST TECHNIQUE: Multidetector CT imaging of the head and cervical spine was performed following the standard protocol without intravenous contrast. Multiplanar CT image  reconstructions of the cervical spine were also generated. COMPARISON:  None. CT head and cervical spine 09/20/2018 FINDINGS: CT HEAD FINDINGS Brain: Diffuse cerebral atrophy. Ventricular dilatation consistent with central atrophy. Low-attenuation changes throughout the deep white matter consistent small vessel ischemia. Basal ganglia calcifications. No mass effect or midline shift. No abnormal extra-axial fluid collections. Gray-white matter junctions are distinct. Basal cisterns are not effaced. No acute intracranial hemorrhage. Vascular: Moderate intracranial arterial calcifications. Skull: Calvarium appears intact. No acute depressed skull fractures. Sinuses/Orbits: Mucosal thickening in the left sphenoid sinus. Paranasal sinuses and mastoid air cells are otherwise clear. Other: No significant change since prior study. CT CERVICAL SPINE FINDINGS Alignment: Alignment of the cervical spine is unchanged since previous study. Congenital or posttraumatic fusion of C3-C4 with fixed anterior placement of C3 on C4. This is unchanged. Slight anterior subluxation of C6 on C7, also unchanged and likely degenerative. Normal alignment of the facet joints. Cervical spine is convex towards the right. This is unchanged since prior study and likely related to degenerative or posttraumatic changes. C1-2 articulation appears intact. Skull base and vertebrae: Skull base appears intact. No vertebral compression deformities. No focal bone lesion or bone destruction. Soft tissues and spinal canal: No prevertebral soft tissue swelling. No abnormal paraspinal soft tissue mass or infiltration. Vascular calcifications. Disc levels: Fusion of C3-C4. Degenerative changes otherwise with narrowed interspaces and mild endplate hypertrophic changes. Upper chest: Mild scarring in the lung apices. Large left thyroid nodule, likely goiter, measuring 3.5 cm diameter. This is poorly visualized due to streak artifact but has been present on previous  studies. Other: None. IMPRESSION: 1. No acute intracranial abnormalities. Chronic atrophy and small vessel ischemic changes. 2. Alignment of the cervical spine is unchanged since previous study. Congenital or posttraumatic fusion of C3-C4. Slight anterior subluxation of C6 on C7 is also unchanged and likely degenerative. Cervical spine convex towards the right, also unchanged and likely degenerative. No acute displaced fractures identified. 3. Large left thyroid nodule, likely goiter. Electronically Signed   By: Burman Nieves M.D.   On: 10/11/2018 20:21    Assessment/Plan Principal Problem:   Hypothermia Active Problems:   CKD (chronic kidney disease) stage 3, GFR 30-59 ml/min (HCC)   Thyroid nodule   Pulmonary embolus (HCC)   Fall   Hypothermia -Temperature as low as 94.8 F in the ED.  Placed on [employer]  and temperature improved slightly (96.5 F). -Unclear if this is related to an infectious etiology as patient has no leukocytosis.  Lactic acid normal.  Not hypotensive.  Check UA and chest x-ray. -CK normal -P.M. cortisol level 4.8.  Check a.m. cortisol level. -Check TSH, free T4 levels -Continue active external warming at this time -Continue to monitor temperature  Unwitnessed fall/ possible syncope -Patient found to be hypothermic.  No leukocytosis and lactic acid normal.  Not hypotensive.  Checking UA and chest x-ray to look for possible infectious source. -Not hypoglycemic; CBG 100 on admission. -Head CT negative for acute finding.  CT C-spine negative for acute finding.  X-ray of left elbow with soft tissue injury; no fracture or subluxation. -Orthostatic vitals -Continue IV fluid hydration -Troponin level normal.  EKG pending. -Echocardiogram -Carotid Dopplers -Seizure less likely as CK normal. Will hold off ordering EEG.  -PT/OT evaluation  Thyroid nodule -CT with finding of large left thyroid nodule. -Check TSH, free T4 levels -Thyroid ultrasound  CKD  3 -Stable.  Creatinine 1.2, at baseline.  History of PE -Continues Eliquis  Depression, dementia -Continue home medications  DVT prophylaxis: Eliquis Code Status: DNR.  Confirmed with nursing home staff. Family Communication: No family available. Disposition Plan: Anticipate discharge after clinical improvement. Consults called: None Admission status: Observation, telemetry   John Giovanni MD [facility] Pager [phone]  If 7PM-7AM, please contact night-coverage [email or web address] Password [email or web address]  10/12/2018, 12:09 AM

## 2018-10-11 NOTE — ED Notes (Signed)
Report called to rn on 4n 

## 2018-10-11 NOTE — ED Notes (Signed)
atttempting to get out of bed warming blanket still on the pt.   Seizure pad on rt rail

## 2018-10-11 NOTE — ED Notes (Signed)
Admitting doctor at the bedside 

## 2018-10-11 NOTE — ED Notes (Signed)
To x-ray

## 2018-10-11 NOTE — ED Notes (Signed)
Hx dementia

## 2018-10-12 ENCOUNTER — Observation Stay (HOSPITAL_COMMUNITY): Payer: Medicare Other

## 2018-10-12 DIAGNOSIS — T68XXXA Hypothermia, initial encounter: Secondary | ICD-10-CM | POA: Diagnosis not present

## 2018-10-12 DIAGNOSIS — W19XXXA Unspecified fall, initial encounter: Secondary | ICD-10-CM

## 2018-10-12 LAB — URINALYSIS, ROUTINE W REFLEX MICROSCOPIC
Bilirubin Urine: NEGATIVE
Glucose, UA: NEGATIVE mg/dL
Hgb urine dipstick: NEGATIVE
Ketones, ur: NEGATIVE mg/dL
Leukocytes,Ua: NEGATIVE
Nitrite: NEGATIVE
Protein, ur: NEGATIVE mg/dL
Specific Gravity, Urine: 1.011 (ref 1.005–1.030)
pH: 6 (ref 5.0–8.0)

## 2018-10-12 LAB — CORTISOL-AM, BLOOD: Cortisol - AM: 12.7 ug/dL (ref 6.7–22.6)

## 2018-10-12 LAB — T4, FREE: Free T4: 0.91 ng/dL (ref 0.82–1.77)

## 2018-10-12 LAB — MRSA PCR SCREENING: MRSA by PCR: NEGATIVE

## 2018-10-12 LAB — TSH: TSH: 2.562 u[IU]/mL (ref 0.350–4.500)

## 2018-10-12 NOTE — Progress Notes (Signed)
Patient rectal temp after one hour off the bear hugger = 98.5 Waiting for TSH results per Dr. Maryfrances Bunnell. Called Case Management to schedule a pick up time for [facility] to transport patient back to SNF [facility]).

## 2018-10-12 NOTE — Progress Notes (Signed)
Made a total of 3 attempts to call and give report on patient.  Patient is discharged with [facility]. 2 vials of blood were found in room by environmental services with STAT labels. Nurse called down to lab and was told to go ahead and send.

## 2018-10-12 NOTE — Progress Notes (Signed)
Patients daughter called for an update.

## 2018-10-12 NOTE — Progress Notes (Signed)
Attempting to call [phone] to give report.

## 2018-10-12 NOTE — Progress Notes (Signed)
Patient will discharge too: Returning to [place] Discharge date: 10/12/2018 Family notified: Roberta Griffin, daughter, [phone] Transport by: Sharin Mons  Per MD patient is appropriate for discharge and will discharge too [facility] (facility of origin) in room 204B. RN, patient, patient's family, and facility have been notified of discharge. Assessment, FL-2, PASRR, and discharge summary sent to facillity. RN was provided with the following number for report: [phone]. PTAR was arranged to transfer patient to the facility. CSW is signing off.

## 2018-10-12 NOTE — Plan of Care (Signed)
Nurse spoke with Dr. Maryfrances Bunnell, bear hugger discontinued. After an hour take temperature rectally and call Dr. Maryfrances Bunnell with the temp. If patient can maintain her temperature she can discharge.

## 2018-10-12 NOTE — Progress Notes (Signed)
PT Cancellation Note  Patient Details Name: Roberta Griffin MRN: [id number] DOB: [birth year]   Cancelled Treatment:    Reason Eval/Treat Not Completed: Other (comment)   Discussed pt with Tamera, RN, who expressed concern about getting pt's core temperature to a safe and stable place before mobilizing; Will hold today, and follow up for appropriateness of PT eval;   Van Clines, PT  Acute Rehabilitation Services Pager [phone] Office [phone]    Levi Aland 10/12/2018, 9:17 AM

## 2018-10-12 NOTE — Progress Notes (Addendum)
CSW received contact from [facility] regarding discharging patient back to [place]. CSW contacted daughter and left a voicemail. CSW called [facility] and was informed the room number is 204B and number for report is [phone]. CSW will continue to assist discharge process.  Tenna Delaine, LCSW, LCAS-A Clinical Social Worker II [phone]

## 2018-10-12 NOTE — Plan of Care (Signed)
  Problem: Activity Intolerance Goal: Ability to return to baseline activity level will improve Outcome: Progressing

## 2018-10-12 NOTE — Discharge Summary (Signed)
Physician Discharge Summary  Cecia Egge ZOX:[id number] DOB: 02-22-1926 DOA: 10/11/2018  PCP: Patient, No Pcp Per  Admit date: 10/11/2018 Discharge date: 10/12/2018  Admitted From: [facility]  Disposition:  [facility]   Recommendations for Outpatient Follow-up:  1. Please follow up TSH 2. If TSH normal (as previous TSH last Aug), obtain thyroid US; further work up pending results and discussion with family      Home Health: N/A  Equipment/Devices: None  Discharge Condition: Fair  CODE STATUS: FULL Diet recommendation: Regular  Brief/Interim Summary: Mrs. Tomasso is a 85 y.o. F with dementia, HTN, and left thyroid nodule who presents with fall.  Unwitnessed fall. Patient unable to provide details. I discussed with facility personally, and nothing suggests syncopal event, patient falls frequently, but because she was suspected to have hit her head, and takes Plavix, she was referred to the ER for CT head.  In the ER, CT head normal.  Patient appeared at baseline, no complaints.  However, she was incidentally noted to be hypothermic, and so the hospitalist service were asked to observe.       PRINCIPAL HOSPITAL DIAGNOSIS: Hypothermia, unclear cause    Discharge Diagnoses:   Hypothermia Patient put on Bair hugger in ER.  Temp normalized by arrival to floor.  Mentation at baseline this morning.  Urinalysis without cellularity/inflammation, CXR clear.  Normal WBC.  No clinical signs of infection noted here by nursing, nor at her facility prior to transfer. At present she appears to be at baseline,without anything to suggest a serious bacterial infection. -Safe to discharge home  Thyroid nodule Longstanding nodule.  Previously normal TSH.  I spoke with daughter who was unaware of nodule.   -Recommend follow up TSH, further work up (scintigraphy vs [place]) pending TSH, goals of care  Fall Patient falls frequently, due to age, dementia, frailty.  Nothing in history suggests  syncope.  No syncopal work up needed.  Dementia At baseline  Hypertension Stable       Discharge Instructions  Discharge Instructions    Diet general   Complete by:  As directed    Increase activity slowly   Complete by:  As directed      Allergies as of 10/12/2018      Reactions   Tramadol Other (See Comments)   twitching      Medication List    TAKE these medications   acetaminophen 500 MG tablet Commonly known as:  TYLENOL Take 500 mg by mouth every 8 (eight) hours.   busPIRone 15 MG tablet Commonly known as:  BUSPAR Take 15 mg by mouth 2 (two) times daily.   citalopram 10 MG tablet Commonly known as:  CELEXA Take 10 mg by mouth daily.   divalproex 125 MG DR tablet Commonly known as:  DEPAKOTE Take 125 mg by mouth daily.   docusate sodium 100 MG capsule Commonly known as:  COLACE Take 1 capsule (100 mg total) by mouth 2 (two) times daily.   ELIQUIS 2.5 MG Tabs tablet Generic drug:  apixaban Take 2.5 mg by mouth 2 (two) times daily.   HYDROcodone-acetaminophen 5-325 MG tablet Commonly known as:  NORCO/VICODIN Take 1 tablet by mouth every 6 (six) hours as needed for severe pain.   Melatonin 3 MG Tabs Take 3 mg by mouth at bedtime.   metoprolol succinate 50 MG 24 hr tablet Commonly known as:  TOPROL-XL Take 1 tablet (50 mg total) by mouth daily. Take with or immediately following a meal. What changed:  how  much to take   mirtazapine 7.5 MG tablet Commonly known as:  REMERON Take 7.5 mg by mouth at bedtime.   NAMENDA XR 14 MG Cp24 24 hr capsule Generic drug:  memantine Take 14 mg by mouth daily.   NUTRITIONAL SUPPLEMENT PO Take 1 Can by mouth 3 (three) times daily.   ENSURE Take 120 mLs by mouth 3 (three) times daily between meals.   polyethylene glycol packet Commonly known as:  MIRALAX Take 17 g by mouth daily as needed for moderate constipation.   risperiDONE 0.25 MG tablet Commonly known as:  RISPERDAL Take 0.25 mg by mouth at  bedtime.   Vitamin D (Ergocalciferol) 1.25 MG (50000 UT) Caps capsule Commonly known as:  DRISDOL Take 50,000 Units by mouth every 30 (thirty) days.       Allergies  Allergen Reactions  . Tramadol Other (See Comments)    twitching    Consultations:  None   Procedures/Studies: Dg Chest 2 View  Result Date: 10/12/2018 CLINICAL DATA:  85 year old female with fall. EXAM: CHEST - 2 VIEW COMPARISON:  Chest radiograph dated 07/26/2018 FINDINGS: There is no focal consolidation, pleural effusion, or pneumothorax. There is mild eventration of the right hemidiaphragm. Stable cardiomegaly. Atherosclerotic calcification of the aorta. Ovoid soft tissue density to the left of the trachea with deviation of the trachea to the right similar to prior radiograph noted. This is most consistent with a left thyroid nodule also seen on the CT of 02/13/2017. Bilateral shoulder arthroplasties. No acute osseous pathology. Osteopenia and degenerative changes of the spine. IMPRESSION: 1. No acute cardiopulmonary process. 2. Stable cardiomegaly. 3. Left thyroid nodule. Electronically Signed   By: Elgie Collard M.D.   On: 10/12/2018 00:02   Dg Elbow 2 Views Left  Result Date: 10/11/2018 CLINICAL DATA:  Pt has AMS; fell on left elbow. Pt has a small cut on elbow EXAM: LEFT ELBOW - 2 VIEW COMPARISON:  02/15/2017 FINDINGS: Soft tissue irregularity along the LATERAL aspect of the elbow. There is no acute fracture or subluxation. No evidence for joint effusion. No radiopaque foreign body. IMPRESSION: 1. Soft tissue injury. 2. No evidence for acute osseous abnormality. Electronically Signed   By: Norva Pavlov M.D.   On: 10/11/2018 20:01   Ct Head Wo Contrast  Result Date: 10/11/2018 CLINICAL DATA:  Head trauma. Patient is anticoagulated. EXAM: CT HEAD WITHOUT CONTRAST CT CERVICAL SPINE WITHOUT CONTRAST TECHNIQUE: Multidetector CT imaging of the head and cervical spine was performed following the standard protocol  without intravenous contrast. Multiplanar CT image reconstructions of the cervical spine were also generated. COMPARISON:  None. CT head and cervical spine 09/20/2018 FINDINGS: CT HEAD FINDINGS Brain: Diffuse cerebral atrophy. Ventricular dilatation consistent with central atrophy. Low-attenuation changes throughout the deep white matter consistent small vessel ischemia. Basal ganglia calcifications. No mass effect or midline shift. No abnormal extra-axial fluid collections. Gray-white matter junctions are distinct. Basal cisterns are not effaced. No acute intracranial hemorrhage. Vascular: Moderate intracranial arterial calcifications. Skull: Calvarium appears intact. No acute depressed skull fractures. Sinuses/Orbits: Mucosal thickening in the left sphenoid sinus. Paranasal sinuses and mastoid air cells are otherwise clear. Other: No significant change since prior study. CT CERVICAL SPINE FINDINGS Alignment: Alignment of the cervical spine is unchanged since previous study. Congenital or posttraumatic fusion of C3-C4 with fixed anterior placement of C3 on C4. This is unchanged. Slight anterior subluxation of C6 on C7, also unchanged and likely degenerative. Normal alignment of the facet joints. Cervical spine is convex towards the right.  This is unchanged since prior study and likely related to degenerative or posttraumatic changes. C1-2 articulation appears intact. Skull base and vertebrae: Skull base appears intact. No vertebral compression deformities. No focal bone lesion or bone destruction. Soft tissues and spinal canal: No prevertebral soft tissue swelling. No abnormal paraspinal soft tissue mass or infiltration. Vascular calcifications. Disc levels: Fusion of C3-C4. Degenerative changes otherwise with narrowed interspaces and mild endplate hypertrophic changes. Upper chest: Mild scarring in the lung apices. Large left thyroid nodule, likely goiter, measuring 3.5 cm diameter. This is poorly visualized due  to streak artifact but has been present on previous studies. Other: None. IMPRESSION: 1. No acute intracranial abnormalities. Chronic atrophy and small vessel ischemic changes. 2. Alignment of the cervical spine is unchanged since previous study. Congenital or posttraumatic fusion of C3-C4. Slight anterior subluxation of C6 on C7 is also unchanged and likely degenerative. Cervical spine convex towards the right, also unchanged and likely degenerative. No acute displaced fractures identified. 3. Large left thyroid nodule, likely goiter. Electronically Signed   By: Burman Nieves M.D.   On: 10/11/2018 20:21   Ct Head Wo Contrast  Result Date: 09/20/2018 CLINICAL DATA:  Unwitnessed fall, on blood thinners EXAM: CT HEAD WITHOUT CONTRAST CT CERVICAL SPINE WITHOUT CONTRAST TECHNIQUE: Multidetector CT imaging of the head and cervical spine was performed following the standard protocol without intravenous contrast. Multiplanar CT image reconstructions of the cervical spine were also generated. COMPARISON:  07/26/2018 FINDINGS: CT HEAD FINDINGS Brain: There is atrophy and chronic small vessel disease changes. Remote right parietal infarct, stable. No acute intracranial abnormality. Specifically, no hemorrhage, hydrocephalus, mass lesion, acute infarction, or significant intracranial injury. Vascular: No hyperdense vessel or unexpected calcification. Skull: No acute calvarial abnormality. Sinuses/Orbits: Visualized paranasal sinuses and mastoids clear. Orbital soft tissues unremarkable. Other: None CT CERVICAL SPINE FINDINGS Alignment: No subluxation.  Rightward scoliosis. Skull base and vertebrae: No acute fracture. No primary bone lesion or focal pathologic process. Soft tissues and spinal canal: No prevertebral fluid or swelling. No visible canal hematoma. Disc levels: Fusion from C3-C5, likely degenerative. Diffuse degenerative disc disease and facet disease throughout the cervical spine. Upper chest: No acute  findings Other: Carotid artery calcifications. IMPRESSION: Atrophy, chronic microvascular disease. No acute intracranial abnormality. Old right parietal infarct. Degenerative disc and facet disease throughout the cervical spine. Fusion from C3-C5. No acute bony abnormality. Electronically Signed   By: Charlett Nose M.D.   On: 09/20/2018 02:21   Ct Cervical Spine Wo Contrast  Result Date: 10/11/2018 CLINICAL DATA:  Head trauma. Patient is anticoagulated. EXAM: CT HEAD WITHOUT CONTRAST CT CERVICAL SPINE WITHOUT CONTRAST TECHNIQUE: Multidetector CT imaging of the head and cervical spine was performed following the standard protocol without intravenous contrast. Multiplanar CT image reconstructions of the cervical spine were also generated. COMPARISON:  None. CT head and cervical spine 09/20/2018 FINDINGS: CT HEAD FINDINGS Brain: Diffuse cerebral atrophy. Ventricular dilatation consistent with central atrophy. Low-attenuation changes throughout the deep white matter consistent small vessel ischemia. Basal ganglia calcifications. No mass effect or midline shift. No abnormal extra-axial fluid collections. Gray-white matter junctions are distinct. Basal cisterns are not effaced. No acute intracranial hemorrhage. Vascular: Moderate intracranial arterial calcifications. Skull: Calvarium appears intact. No acute depressed skull fractures. Sinuses/Orbits: Mucosal thickening in the left sphenoid sinus. Paranasal sinuses and mastoid air cells are otherwise clear. Other: No significant change since prior study. CT CERVICAL SPINE FINDINGS Alignment: Alignment of the cervical spine is unchanged since previous study. Congenital or posttraumatic fusion of C3-C4  with fixed anterior placement of C3 on C4. This is unchanged. Slight anterior subluxation of C6 on C7, also unchanged and likely degenerative. Normal alignment of the facet joints. Cervical spine is convex towards the right. This is unchanged since prior study and likely  related to degenerative or posttraumatic changes. C1-2 articulation appears intact. Skull base and vertebrae: Skull base appears intact. No vertebral compression deformities. No focal bone lesion or bone destruction. Soft tissues and spinal canal: No prevertebral soft tissue swelling. No abnormal paraspinal soft tissue mass or infiltration. Vascular calcifications. Disc levels: Fusion of C3-C4. Degenerative changes otherwise with narrowed interspaces and mild endplate hypertrophic changes. Upper chest: Mild scarring in the lung apices. Large left thyroid nodule, likely goiter, measuring 3.5 cm diameter. This is poorly visualized due to streak artifact but has been present on previous studies. Other: None. IMPRESSION: 1. No acute intracranial abnormalities. Chronic atrophy and small vessel ischemic changes. 2. Alignment of the cervical spine is unchanged since previous study. Congenital or posttraumatic fusion of C3-C4. Slight anterior subluxation of C6 on C7 is also unchanged and likely degenerative. Cervical spine convex towards the right, also unchanged and likely degenerative. No acute displaced fractures identified. 3. Large left thyroid nodule, likely goiter. Electronically Signed   By: Burman NievesWilliam  Stevens M.D.   On: 10/11/2018 20:21   Ct Cervical Spine Wo Contrast  Result Date: 09/20/2018 CLINICAL DATA:  Unwitnessed fall, on blood thinners EXAM: CT HEAD WITHOUT CONTRAST CT CERVICAL SPINE WITHOUT CONTRAST TECHNIQUE: Multidetector CT imaging of the head and cervical spine was performed following the standard protocol without intravenous contrast. Multiplanar CT image reconstructions of the cervical spine were also generated. COMPARISON:  07/26/2018 FINDINGS: CT HEAD FINDINGS Brain: There is atrophy and chronic small vessel disease changes. Remote right parietal infarct, stable. No acute intracranial abnormality. Specifically, no hemorrhage, hydrocephalus, mass lesion, acute infarction, or significant  intracranial injury. Vascular: No hyperdense vessel or unexpected calcification. Skull: No acute calvarial abnormality. Sinuses/Orbits: Visualized paranasal sinuses and mastoids clear. Orbital soft tissues unremarkable. Other: None CT CERVICAL SPINE FINDINGS Alignment: No subluxation.  Rightward scoliosis. Skull base and vertebrae: No acute fracture. No primary bone lesion or focal pathologic process. Soft tissues and spinal canal: No prevertebral fluid or swelling. No visible canal hematoma. Disc levels: Fusion from C3-C5, likely degenerative. Diffuse degenerative disc disease and facet disease throughout the cervical spine. Upper chest: No acute findings Other: Carotid artery calcifications. IMPRESSION: Atrophy, chronic microvascular disease. No acute intracranial abnormality. Old right parietal infarct. Degenerative disc and facet disease throughout the cervical spine. Fusion from C3-C5. No acute bony abnormality. Electronically Signed   By: Charlett NoseKevin  Dover M.D.   On: 09/20/2018 02:21   Dg Hip Unilat W Or Wo Pelvis 2-3 Views Left  Result Date: 09/20/2018 CLINICAL DATA:  Fall EXAM: DG HIP (WITH OR WITHOUT PELVIS) 2-3V LEFT COMPARISON:  07/26/2018 FINDINGS: Prior left hip replacement. No acute bony abnormality. Specifically, no fracture, subluxation, or dislocation. Calcified fibroid centrally within the pelvis. IMPRESSION: Prior left hip replacement.  No acute bony abnormality. Electronically Signed   By: Charlett NoseKevin  Dover M.D.   On: 09/20/2018 02:24       Subjective: Sleepy, but no pain, dyspnea, cough.  Temp normal.      Discharge Exam: Vitals:   10/12/18 0700 10/12/18 1044  BP: 124/79   Pulse:    Resp: 13   Temp:  98.5 F (36.9 C)  SpO2:     Vitals:   10/12/18 0500 10/12/18 0600 10/12/18 0700 10/12/18 1044  BP: (!) 166/83 (!) 151/64 124/79   Pulse:      Resp: Temp:    98.5 F (36.9 C)  TempSrc:    Rectal  SpO2:      Weight:      Height:        General: Pt is sleeping but  rousable, not in acute distress Cardiovascular: RRR, nl S1-S2, no murmurs appreciated.   No LE edema.   Respiratory: Normal respiratory rate and rhythm.  CTAB without rales or wheezes. Abdominal: Abdomen soft and non-tender.  No distension or HSM.   Neuro/Psych: Strength symmetric in upper and lower extremities.  Judgment and insight appear severely impaired by dementia.  Oriented to self only.   The results of significant diagnostics from this hospitalization (including imaging, microbiology, ancillary and laboratory) are listed below for reference.     Microbiology: Recent Results (from the past 240 hour(s))  MRSA PCR Screening     Status: None   Collection Time: 10/12/18  1:20 AM  Result Value Ref Range Status   MRSA by PCR NEGATIVE NEGATIVE Final    Comment:        The GeneXpert MRSA Assay (FDA approved for NASAL specimens only), is one component of a comprehensive MRSA colonization surveillance program. It is not intended to diagnose MRSA infection nor to guide or monitor treatment for MRSA infections. Performed at [facility], [address] [place], [place] [address]      Labs: BNP (last 3 results) No results for input(s): BNP in the last 8760 hours. Basic Metabolic Panel: Recent Labs  Lab 10/11/18 1847  NA 142  K 4.4  CL 106  CO2 26  GLUCOSE 98  BUN 21  CREATININE 1.27*  CALCIUM 9.5   Liver Function Tests: Recent Labs  Lab 10/11/18 1847  AST 24  ALT 18  ALKPHOS 90  BILITOT 0.7  PROT 7.1  ALBUMIN 3.7   No results for input(s): LIPASE, AMYLASE in the last 168 hours. No results for input(s): AMMONIA in the last 168 hours. CBC: Recent Labs  Lab 10/11/18 1847  WBC 5.7  NEUTROABS 3.7  HGB 12.5  HCT 40.6  MCV 91.6  PLT 236   Cardiac Enzymes: Recent Labs  Lab 10/11/18 1847 10/11/18 2305  CKTOTAL 121  --   TROPONINI  --  <0.03   BNP: Invalid input(s): POCBNP CBG: Recent Labs  Lab 10/11/18 1840  GLUCAP 100*   D-Dimer No  results for input(s): DDIMER in the last 72 hours. Hgb A1c No results for input(s): HGBA1C in the last 72 hours. Lipid Profile No results for input(s): CHOL, HDL, LDLCALC, TRIG, CHOLHDL, LDLDIRECT in the last 72 hours. Thyroid function studies No results for input(s): TSH, T4TOTAL, T3FREE, THYROIDAB in the last 72 hours.  Invalid input(s): FREET3 Anemia work up No results for input(s): VITAMINB12, FOLATE, FERRITIN, TIBC, IRON, RETICCTPCT in the last 72 hours. Urinalysis    Component Value Date/Time   COLORURINE YELLOW 10/12/2018 0015   APPEARANCEUR CLEAR 10/12/2018 0015   LABSPEC 1.011 10/12/2018 0015   PHURINE 6.0 10/12/2018 0015   GLUCOSEU NEGATIVE 10/12/2018 0015   HGBUR NEGATIVE 10/12/2018 0015   BILIRUBINUR NEGATIVE 10/12/2018 0015   KETONESUR NEGATIVE 10/12/2018 0015   PROTEINUR NEGATIVE 10/12/2018 0015   NITRITE NEGATIVE 10/12/2018 0015   LEUKOCYTESUR NEGATIVE 10/12/2018 0015   Sepsis Labs Invalid input(s): PROCALCITONIN,  WBC,  LACTICIDVEN Microbiology Recent Results (from the past 240 hour(s))  MRSA PCR Screening  Status: None   Collection Time: 10/12/18  1:20 AM  Result Value Ref Range Status   MRSA by PCR NEGATIVE NEGATIVE Final    Comment:        The GeneXpert MRSA Assay (FDA approved for NASAL specimens only), is one component of a comprehensive MRSA colonization surveillance program. It is not intended to diagnose MRSA infection nor to guide or monitor treatment for MRSA infections. Performed at [facility], [address] [place], [place] [address]      Time coordinating discharge: 40 minutes       SIGNED:   Alberteen Sam, MD  [facility] 10/12/2018, 12:39 PM

## 2018-10-12 NOTE — Discharge Instructions (Signed)

## 2018-10-15 ENCOUNTER — Ambulatory Visit (INDEPENDENT_AMBULATORY_CARE_PROVIDER_SITE_OTHER): Payer: Medicare Other | Admitting: Orthopaedic Surgery

## 2019-01-11 ENCOUNTER — Encounter (HOSPITAL_COMMUNITY): Payer: Self-pay

## 2019-01-11 ENCOUNTER — Other Ambulatory Visit: Payer: Self-pay

## 2019-01-11 ENCOUNTER — Emergency Department (HOSPITAL_COMMUNITY)
Admission: EM | Admit: 2019-01-11 | Discharge: 2019-01-11 | Disposition: A | Discharge status: Home / Self Care | Payer: Medicare Other | Attending: Emergency Medicine | Admitting: Emergency Medicine

## 2019-01-11 ENCOUNTER — Emergency Department (HOSPITAL_COMMUNITY): Payer: Medicare Other

## 2019-01-11 DIAGNOSIS — I130 Hypertensive heart and chronic kidney disease with heart failure and stage 1 through stage 4 chronic kidney disease, or unspecified chronic kidney disease: Secondary | ICD-10-CM | POA: Diagnosis not present

## 2019-01-11 DIAGNOSIS — F0390 Unspecified dementia without behavioral disturbance: Secondary | ICD-10-CM | POA: Diagnosis not present

## 2019-01-11 DIAGNOSIS — Y92121 Bathroom in nursing home as the place of occurrence of the external cause: Secondary | ICD-10-CM | POA: Diagnosis not present

## 2019-01-11 DIAGNOSIS — S0990XA Unspecified injury of head, initial encounter: Secondary | ICD-10-CM | POA: Diagnosis present

## 2019-01-11 DIAGNOSIS — Y999 Unspecified external cause status: Secondary | ICD-10-CM | POA: Diagnosis not present

## 2019-01-11 DIAGNOSIS — Z8673 Personal history of transient ischemic attack (TIA), and cerebral infarction without residual deficits: Secondary | ICD-10-CM | POA: Insufficient documentation

## 2019-01-11 DIAGNOSIS — I5032 Chronic diastolic (congestive) heart failure: Secondary | ICD-10-CM | POA: Insufficient documentation

## 2019-01-11 DIAGNOSIS — G309 Alzheimer's disease, unspecified: Secondary | ICD-10-CM | POA: Diagnosis not present

## 2019-01-11 DIAGNOSIS — Z7901 Long term (current) use of anticoagulants: Secondary | ICD-10-CM | POA: Insufficient documentation

## 2019-01-11 DIAGNOSIS — W19XXXA Unspecified fall, initial encounter: Secondary | ICD-10-CM

## 2019-01-11 DIAGNOSIS — W1830XA Fall on same level, unspecified, initial encounter: Secondary | ICD-10-CM | POA: Insufficient documentation

## 2019-01-11 DIAGNOSIS — J449 Chronic obstructive pulmonary disease, unspecified: Secondary | ICD-10-CM | POA: Diagnosis not present

## 2019-01-11 DIAGNOSIS — N183 Chronic kidney disease, stage 3 (moderate): Secondary | ICD-10-CM | POA: Insufficient documentation

## 2019-01-11 DIAGNOSIS — Y939 Activity, unspecified: Secondary | ICD-10-CM | POA: Insufficient documentation

## 2019-01-11 DIAGNOSIS — Z79899 Other long term (current) drug therapy: Secondary | ICD-10-CM | POA: Insufficient documentation

## 2019-01-11 NOTE — ED Provider Notes (Signed)
[facility]-EMERGENCY DEPT Provider Note   CSN: [id number] Arrival date & time: 01/11/19  0516    History   Chief Complaint Chief Complaint  Patient presents with  . Fall   Level 5 caveat due to dementia HPI Roberta Griffin is a 85 y.o. female.     The history is provided by the nursing home. The history is limited by the condition of the patient.  Fall  This is a new problem. The problem occurs constantly. Nothing aggravates the symptoms. Nothing relieves the symptoms.   Presents from nursing facility for fall.  She has a history of COPD and dementia. She is known to be on Eliquis Nursing home reports the patient was found on the floor, and they are concerned that she flipped over the end of the bed. No obvious signs of trauma.  Patient is usually unable to ambulate. Past Medical History:  Diagnosis Date  . COPD (chronic obstructive pulmonary disease) (HCC)   . Dementia (HCC)   . Depression   . Dysphagia   . Hypertension   . Scoliosis     Patient Active Problem List   Diagnosis Date Noted  . Fall 10/12/2018  . Hypothermia 10/11/2018  . Subcapital fracture of left hip (HCC) 07/29/2018  . Hip fracture (HCC) 07/26/2018  . Depression with anxiety 07/26/2017  . High risk medication use 06/03/2017  . Chronic pain of both shoulders 06/03/2017  . Schizophrenia (HCC) 04/26/2017  . GAD (generalized anxiety disorder) 04/05/2017  . History of stroke 12/05/2016  . Pulmonary embolus (HCC) 12/05/2016  . Chronic diastolic CHF (congestive heart failure) (HCC) 03/04/2016  . Insomnia 03/04/2016  . Dysarthria 03/04/2016  . Thyroid nodule 03/04/2016  . Pancreatic mass   . HLD (hyperlipidemia)   . Dysphagia 02/18/2016  . CVA (cerebral vascular accident) (HCC) 02/17/2016  . COPD (chronic obstructive pulmonary disease) (HCC) 02/17/2016  . Alzheimer's dementia without behavioral disturbance (HCC) 02/17/2016  . Essential hypertension 02/17/2016  . Normocytic  anemia 02/17/2016  . CKD (chronic kidney disease) stage 3, GFR 30-59 ml/min (HCC) 02/17/2016    Past Surgical History:  Procedure Laterality Date  . ABDOMINAL HYSTERECTOMY    . HIP ARTHROPLASTY Left 07/29/2018   Procedure: ARTHROPLASTY BIPOLAR HIP (HEMIARTHROPLASTY);  Surgeon: Valeria Batman, MD;  Location: [facility] OR;  Service: Orthopedics;  Laterality: Left;  . KNEE SURGERY    . SHOULDER SURGERY    . VITRECTOMY AND CATARACT       OB History   No obstetric history on file.      Home Medications    Prior to Admission medications   Medication Sig Start Date End Date Taking? Authorizing Provider  acetaminophen (TYLENOL) 500 MG tablet Take 500 mg by mouth every 8 (eight) hours.  02/19/18   [provider]  apixaban (ELIQUIS) 2.5 MG TABS tablet Take 2.5 mg by mouth 2 (two) times daily.    [provider]  busPIRone (BUSPAR) 15 MG tablet Take 15 mg by mouth 2 (two) times daily. 02/06/18   [provider]  citalopram (CELEXA) 10 MG tablet Take 10 mg by mouth daily.    [provider]  divalproex (DEPAKOTE) 125 MG DR tablet Take 125 mg by mouth daily.    [provider]  docusate sodium (COLACE) 100 MG capsule Take 1 capsule (100 mg total) by mouth 2 (two) times daily. 07/31/18   Burnadette Pop, MD  ENSURE (ENSURE) Take 120 mLs by mouth 3 (three) times daily between meals. 04/21/18  [provider]  HYDROcodone-acetaminophen (NORCO/VICODIN) 5-325 MG tablet Take 1 tablet by mouth every 6 (six) hours as needed for severe pain. 07/31/18   Burnadette Pop, MD  Melatonin 3 MG TABS Take 3 mg by mouth at bedtime.  05/18/18   [provider]  memantine (NAMENDA XR) 14 MG CP24 24 hr capsule Take 14 mg by mouth daily.    [provider]  metoprolol succinate (TOPROL-XL) 50 MG 24 hr tablet Take 1 tablet (50 mg total) by mouth daily. Take with or immediately following a meal. Patient taking differently: Take 25 mg by mouth daily.  Take with or immediately following a meal. 02/09/16   Mesner, Barbara Cower, MD  mirtazapine (REMERON) 7.5 MG tablet Take 7.5 mg by mouth at bedtime. 10/09/18   [provider]  Nutritional Supplements (NUTRITIONAL SUPPLEMENT PO) Take 1 Can by mouth 3 (three) times daily.     [provider]  polyethylene glycol (MIRALAX) packet Take 17 g by mouth daily as needed for moderate constipation. 07/31/18   Burnadette Pop, MD  risperiDONE (RISPERDAL) 0.25 MG tablet Take 0.25 mg by mouth at bedtime.     [provider]  Vitamin D, Ergocalciferol, (DRISDOL) 50000 units CAPS capsule Take 50,000 Units by mouth every 30 (thirty) days.     [provider]    Family History Family History  Problem Relation Age of Onset  . Stroke Neg Hx     Social History Social History   Tobacco Use  . Smoking status: Never Smoker  . Smokeless tobacco: Never Used  Substance Use Topics  . Alcohol use: No  . Drug use: No     Allergies   Tramadol   Review of Systems Review of Systems  Unable to perform ROS: Dementia     Physical Exam Updated Vital Signs BP (!) 147/84 (BP Location: Right Arm)   Pulse 66   Temp 97.8 F (36.6 C)   Resp 16   SpO2 100%   Physical Exam CONSTITUTIONAL: Elderly/frail HEAD: Normocephalic/atraumatic, no signs of trauma EYES: EOMI ENMT: Mucous membranes moist, no facial trauma NECK: supple no meningeal signs SPINE/BACK: No bruising/crepitance/stepoffs noted to spine CV: S1/S2 noted LUNGS: Lungs are clear to auscultation bilaterally, no apparent distress Chest - no bruising or crepitus ABDOMEN: soft, nontender NEURO: Pt is awake/alert but confused, moves all extremitiesx4   EXTREMITIES: pulses normal/equal, full ROM, pelvis stable All other extremities/joints palpated/ranged and nontender SKIN: warm, color normal PSYCH: unable to assess  ED Treatments / Results  Labs (all labs ordered are listed, but only abnormal results are displayed)  Labs Reviewed - No data to display  EKG None  Radiology Ct Head Wo Contrast  Result Date: 01/11/2019 CLINICAL DATA:  Fall, dementia EXAM: CT HEAD WITHOUT CONTRAST CT CERVICAL SPINE WITHOUT CONTRAST TECHNIQUE: Multidetector CT imaging of the head and cervical spine was performed following the standard protocol without intravenous contrast. Multiplanar CT image reconstructions of the cervical spine were also generated. COMPARISON:  10/11/2018 FINDINGS: CT HEAD FINDINGS Brain: No evidence of acute infarction, hemorrhage, hydrocephalus, extra-axial collection or mass lesion/mass effect. Age related atrophy. Subcortical white matter and periventricular small vessel ischemic changes. Vascular: Mild intracranial atherosclerosis. Skull: Normal. Negative for fracture or focal lesion. Sinuses/Orbits: The visualized paranasal sinuses are essentially clear. The mastoid air cells are unopacified. Other: None. CT CERVICAL SPINE FINDINGS Alignment: Normal cervical lordosis. Mid cervical dextroscoliosis, possibly positional. Skull base and vertebrae: No acute fracture. No primary bone lesion or focal pathologic process. Soft tissues  and spinal canal: No prevertebral fluid or swelling. No visible canal hematoma. Disc levels: Fusion at C3-5. Mild multilevel degenerative changes. Spinal canal is patent. Upper chest: Biapical pleural-parenchymal scarring. Other: None. IMPRESSION: No evidence of acute intracranial abnormality. Atrophy with small vessel ischemic changes. No evidence of traumatic injury to the cervical spine. Mild multilevel degenerative changes. Electronically Signed   By: Charline BillsSriyesh  Krishnan M.D.   On: 01/11/2019 07:28   Ct Cervical Spine Wo Contrast  Result Date: 01/11/2019 CLINICAL DATA:  Fall, dementia EXAM: CT HEAD WITHOUT CONTRAST CT CERVICAL SPINE WITHOUT CONTRAST TECHNIQUE: Multidetector CT imaging of the head and cervical spine was performed following the standard protocol without intravenous  contrast. Multiplanar CT image reconstructions of the cervical spine were also generated. COMPARISON:  10/11/2018 FINDINGS: CT HEAD FINDINGS Brain: No evidence of acute infarction, hemorrhage, hydrocephalus, extra-axial collection or mass lesion/mass effect. Age related atrophy. Subcortical white matter and periventricular small vessel ischemic changes. Vascular: Mild intracranial atherosclerosis. Skull: Normal. Negative for fracture or focal lesion. Sinuses/Orbits: The visualized paranasal sinuses are essentially clear. The mastoid air cells are unopacified. Other: None. CT CERVICAL SPINE FINDINGS Alignment: Normal cervical lordosis. Mid cervical dextroscoliosis, possibly positional. Skull base and vertebrae: No acute fracture. No primary bone lesion or focal pathologic process. Soft tissues and spinal canal: No prevertebral fluid or swelling. No visible canal hematoma. Disc levels: Fusion at C3-5. Mild multilevel degenerative changes. Spinal canal is patent. Upper chest: Biapical pleural-parenchymal scarring. Other: None. IMPRESSION: No evidence of acute intracranial abnormality. Atrophy with small vessel ischemic changes. No evidence of traumatic injury to the cervical spine. Mild multilevel degenerative changes. Electronically Signed   By: Charline BillsSriyesh  Krishnan M.D.   On: 01/11/2019 07:28    Procedures Procedures    Medications Ordered in ED Medications - No data to display   Initial Impression / Assessment and Plan / ED Course  I have reviewed the triage vital signs and the nursing notes.         5:57 AM Patient presents from nursing facility after fall.  Nursing staff at the facility reports they heard a loud bang, they found her out of the bed.  They are concerned that she has a head or neck injury. No visible signs of trauma.  No signs of any pelvic injury.  No signs of extremity trauma Will obtain ct head/cspine as she is on eliquis and I am unable to clear c-spine due to dementia 7:34 AM  CT IMAGING NEGATIVE PT WILL BE DISCHARGED BACK TO FACILITY Final Clinical Impressions(s) / ED Diagnoses   Final diagnoses:  Fall, initial encounter  Injury of head, initial encounter    ED Discharge Orders    None       Zadie RhineWickline, Donald, MD 01/11/19 [phone]

## 2019-01-11 NOTE — ED Triage Notes (Signed)
Patient arrives by [facility] from [facility] after a fall. Patient has advanced dementia and tried to get up-staff found patient on floor. EMS reports no injuries-no pain on palpation-EMS found patient on her right side on the floor.

## 2019-01-11 NOTE — ED Notes (Signed)
Bed: BM84 Expected date:  Expected time:  Means of arrival:  Comments: EMS 85 yo female from [place]-dementia-fall

## 2019-01-11 NOTE — ED Notes (Signed)
She continues to rest comfortably. [facility] was notified by me at 0800 hours.

## 2019-01-19 ENCOUNTER — Non-Acute Institutional Stay: Payer: Medicare Other | Admitting: Internal Medicine

## 2019-01-19 DIAGNOSIS — G309 Alzheimer's disease, unspecified: Secondary | ICD-10-CM

## 2019-01-19 DIAGNOSIS — Z515 Encounter for palliative care: Secondary | ICD-10-CM

## 2019-01-19 DIAGNOSIS — F0280 Dementia in other diseases classified elsewhere without behavioral disturbance: Secondary | ICD-10-CM

## 2019-01-21 ENCOUNTER — Other Ambulatory Visit: Payer: Self-pay

## 2019-03-11 ENCOUNTER — Encounter (HOSPITAL_COMMUNITY): Payer: Self-pay | Admitting: Emergency Medicine

## 2019-03-11 ENCOUNTER — Emergency Department (HOSPITAL_COMMUNITY)
Admission: EM | Admit: 2019-03-11 | Discharge: 2019-03-12 | Disposition: A | Discharge status: Home / Self Care | Payer: Medicare Other | Attending: Emergency Medicine | Admitting: Emergency Medicine

## 2019-03-11 DIAGNOSIS — I5032 Chronic diastolic (congestive) heart failure: Secondary | ICD-10-CM | POA: Insufficient documentation

## 2019-03-11 DIAGNOSIS — F0390 Unspecified dementia without behavioral disturbance: Secondary | ICD-10-CM | POA: Insufficient documentation

## 2019-03-11 DIAGNOSIS — W19XXXA Unspecified fall, initial encounter: Secondary | ICD-10-CM | POA: Diagnosis not present

## 2019-03-11 DIAGNOSIS — Z7901 Long term (current) use of anticoagulants: Secondary | ICD-10-CM | POA: Insufficient documentation

## 2019-03-11 DIAGNOSIS — J449 Chronic obstructive pulmonary disease, unspecified: Secondary | ICD-10-CM | POA: Insufficient documentation

## 2019-03-11 DIAGNOSIS — M7918 Myalgia, other site: Secondary | ICD-10-CM | POA: Diagnosis not present

## 2019-03-11 DIAGNOSIS — Z8673 Personal history of transient ischemic attack (TIA), and cerebral infarction without residual deficits: Secondary | ICD-10-CM | POA: Diagnosis not present

## 2019-03-11 DIAGNOSIS — Z79899 Other long term (current) drug therapy: Secondary | ICD-10-CM | POA: Diagnosis not present

## 2019-03-11 DIAGNOSIS — I110 Hypertensive heart disease with heart failure: Secondary | ICD-10-CM | POA: Insufficient documentation

## 2019-03-11 NOTE — ED Triage Notes (Signed)
Per EMS - Pt had an unwitnessed fall. Pt complaining of pain to right leg and hip. Hip is stable without any shortening. No blood thinners. Hx dementia A+Ox1 (self), baseline. Hx COPD.  140 BP 68 HR 18 R 100%  142 CBG 97.2

## 2019-03-12 ENCOUNTER — Emergency Department (HOSPITAL_COMMUNITY): Payer: Medicare Other

## 2019-03-12 DIAGNOSIS — M7918 Myalgia, other site: Secondary | ICD-10-CM | POA: Diagnosis not present

## 2019-03-12 NOTE — ED Provider Notes (Signed)
Nursing was able to confirm that patient is on Eliquis Will require CT head/cspine If negative d/c back to facility    Ripley Fraise, MD 03/12/19 [phone]

## 2019-03-12 NOTE — ED Notes (Signed)
Spoke with [facility], patient is on Eliquis 2.5 mg 2x a day.

## 2019-03-12 NOTE — ED Notes (Signed)
Attempted to call grandson and update on POC. [place] and notified of patient's return, [facility] called for transport.

## 2019-03-12 NOTE — ED Provider Notes (Signed)
[facility]-EMERGENCY DEPT Provider Note   CSN: [id number] Arrival date & time: 03/11/19  2320     History   Chief Complaint Chief Complaint  Patient presents with  . Fall   Level 5 caveat due to dementia HPI Roberta Griffin is a 85 y.o. female.     The history is provided by the EMS personnel.  Fall This is a new problem. The problem has not changed since onset.Nothing aggravates the symptoms. Nothing relieves the symptoms.  Patient history of COPD, hypertension, dementia presents with fall.  She comes from [place].  Currently she had an unwitnessed fall was reporting pain in her right leg and hip.  No other history is known at this time.  Patient is at baseline alert and oriented x1. Patient is a DNR Past Medical History:  Diagnosis Date  . COPD (chronic obstructive pulmonary disease) (HCC)   . Dementia (HCC)   . Depression   . Dysphagia   . Hypertension   . Scoliosis     Patient Active Problem List   Diagnosis Date Noted  . Fall 10/12/2018  . Hypothermia 10/11/2018  . Subcapital fracture of left hip (HCC) 07/29/2018  . Hip fracture (HCC) 07/26/2018  . Depression with anxiety 07/26/2017  . High risk medication use 06/03/2017  . Chronic pain of both shoulders 06/03/2017  . Schizophrenia (HCC) 04/26/2017  . GAD (generalized anxiety disorder) 04/05/2017  . History of stroke 12/05/2016  . Pulmonary embolus (HCC) 12/05/2016  . Chronic diastolic CHF (congestive heart failure) (HCC) 03/04/2016  . Insomnia 03/04/2016  . Dysarthria 03/04/2016  . Thyroid nodule 03/04/2016  . Pancreatic mass   . HLD (hyperlipidemia)   . Dysphagia 02/18/2016  . CVA (cerebral vascular accident) (HCC) 02/17/2016  . COPD (chronic obstructive pulmonary disease) (HCC) 02/17/2016  . Alzheimer's dementia without behavioral disturbance (HCC) 02/17/2016  . Essential hypertension 02/17/2016  . Normocytic anemia 02/17/2016  . CKD (chronic kidney disease) stage 3, GFR  30-59 ml/min (HCC) 02/17/2016    Past Surgical History:  Procedure Laterality Date  . ABDOMINAL HYSTERECTOMY    . HIP ARTHROPLASTY Left 07/29/2018   Procedure: ARTHROPLASTY BIPOLAR HIP (HEMIARTHROPLASTY);  Surgeon: Valeria BatmanWhitfield, Peter W, MD;  [facility];  Service: Orthopedics;  Laterality: Left;  . KNEE SURGERY    . SHOULDER SURGERY    . VITRECTOMY AND CATARACT       OB History   No obstetric history on file.      Home Medications    Prior to Admission medications   Medication Sig Start Date End Date Taking? Authorizing Provider  acetaminophen (TYLENOL) 500 MG tablet Take 500 mg by mouth every 8 (eight) hours.  02/19/18   [provider]  apixaban (ELIQUIS) 2.5 MG TABS tablet Take 2.5 mg by mouth 2 (two) times daily.    [provider]  busPIRone (BUSPAR) 15 MG tablet Take 15 mg by mouth 2 (two) times daily. 02/06/18   [provider]  citalopram (CELEXA) 10 MG tablet Take 10 mg by mouth daily.    [provider]  divalproex (DEPAKOTE) 125 MG DR tablet Take 125 mg by mouth daily.    [provider]  docusate sodium (COLACE) 100 MG capsule Take 1 capsule (100 mg total) by mouth 2 (two) times daily. 07/31/18   Burnadette PopAdhikari, Amrit, MD  ENSURE (ENSURE) Take 120 mLs by mouth 3 (three) times daily between meals. 04/21/18   [provider]  HYDROcodone-acetaminophen (NORCO/VICODIN) 5-325 MG tablet Take 1 tablet  by mouth every 6 (six) hours as needed for severe pain. 07/31/18   Shelly Coss, MD  Melatonin 3 MG TABS Take 3 mg by mouth at bedtime.  05/18/18   [provider]  memantine (NAMENDA XR) 14 MG CP24 24 hr capsule Take 14 mg by mouth daily.    [provider]  metoprolol succinate (TOPROL-XL) 50 MG 24 hr tablet Take 1 tablet (50 mg total) by mouth daily. Take with or immediately following a meal. Patient taking differently: Take 25 mg by mouth daily. Take with or immediately following a meal. 02/09/16   Mesner, Corene Cornea,  MD  mirtazapine (REMERON) 7.5 MG tablet Take 7.5 mg by mouth at bedtime. 10/09/18   [provider]  Nutritional Supplements (NUTRITIONAL SUPPLEMENT PO) Take 1 Can by mouth 3 (three) times daily.     [provider]  polyethylene glycol (MIRALAX) packet Take 17 g by mouth daily as needed for moderate constipation. 07/31/18   Shelly Coss, MD  risperiDONE (RISPERDAL) 0.25 MG tablet Take 0.25 mg by mouth at bedtime.     [provider]  Vitamin D, Ergocalciferol, (DRISDOL) 50000 units CAPS capsule Take 50,000 Units by mouth every 30 (thirty) days.     [provider]    Family History Family History  Problem Relation Age of Onset  . Stroke Neg Hx     Social History Social History   Tobacco Use  . Smoking status: Never Smoker  . Smokeless tobacco: Never Used  Substance Use Topics  . Alcohol use: No  . Drug use: No     Allergies   Tramadol   Review of Systems Review of Systems  Unable to perform ROS: Dementia     Physical Exam Updated Vital Signs BP (!) 172/67 (BP Location: Right Arm)   Pulse (!) 51   Temp (!) 97.5 F (36.4 C) (Oral)   Resp 16   SpO2 98%   Physical Exam CONSTITUTIONAL: Elderly, frail HEAD: Normocephalic/atraumatic EYES: EOMI ENMT: Mucous membranes moist NECK: Cervical collar in place SPINE/BACK:entire spine nontender, no bruising/crepitance/stepoffs noted to spine CV: S1/S2 noted, no murmurs/rubs/gallops noted LUNGS: Lungs are clear to auscultation bilaterally, no apparent distress ABDOMEN: soft, nontender NEURO: Pt is drowsy but arousable.  She moves all extremities x4.  She is pleasantly confused EXTREMITIES: pulses normal/equal, full ROM, pelvis stable, full range of motion both legs without difficulty, all other extremities/joints palpated/ranged and nontender SKIN: warm, color normal PSYCH: Unable to assess  ED Treatments / Results  Labs (all labs ordered are listed, but only abnormal results are  displayed) Labs Reviewed - No data to display  EKG None  Radiology No results found.  Procedures Procedures    Medications Ordered in ED Medications - No data to display   Initial Impression / Assessment and Plan / ED Course  I have reviewed the triage vital signs and the nursing notes.      12:14 AM [facility] twice with no answer.  She has no signs of any traumatic injury. 12:42 AM Patient awake and alert, no distress.  No signs of any traumatic injury.  Full range of motion of both lower extremities without any obvious tenderness.  No deformities.  Pelvis stable No signs of head injury Defer imaging for now Will d/c back to facility  Final Clinical Impressions(s) / ED Diagnoses   Final diagnoses:  Fall, initial encounter    ED Discharge Orders    None       Ripley Fraise,  MD 03/12/19 [id number]

## 2019-03-28 ENCOUNTER — Emergency Department (HOSPITAL_COMMUNITY): Payer: Medicare Other

## 2019-03-28 ENCOUNTER — Inpatient Hospital Stay (HOSPITAL_COMMUNITY): Payer: Medicare Other | Admitting: Anesthesiology

## 2019-03-28 ENCOUNTER — Inpatient Hospital Stay (HOSPITAL_COMMUNITY)
Admission: EM | Admit: 2019-03-28 | Discharge: 2019-04-01 | DRG: 469 | Disposition: A | Discharge status: Skilled Nursing Facility | Payer: Medicare Other | Source: Skilled Nursing Facility | Attending: Internal Medicine | Admitting: Internal Medicine

## 2019-03-28 ENCOUNTER — Other Ambulatory Visit: Payer: Self-pay

## 2019-03-28 ENCOUNTER — Encounter (HOSPITAL_COMMUNITY): Payer: Self-pay

## 2019-03-28 ENCOUNTER — Encounter (HOSPITAL_COMMUNITY)
Admission: EM | Disposition: A | Discharge status: Skilled Nursing Facility | Payer: Self-pay | Source: Skilled Nursing Facility | Attending: Internal Medicine

## 2019-03-28 DIAGNOSIS — D62 Acute posthemorrhagic anemia: Secondary | ICD-10-CM | POA: Diagnosis not present

## 2019-03-28 DIAGNOSIS — Z681 Body mass index (BMI) 19 or less, adult: Secondary | ICD-10-CM

## 2019-03-28 DIAGNOSIS — F0281 Dementia in other diseases classified elsewhere with behavioral disturbance: Secondary | ICD-10-CM | POA: Diagnosis present

## 2019-03-28 DIAGNOSIS — D631 Anemia in chronic kidney disease: Secondary | ICD-10-CM | POA: Diagnosis present

## 2019-03-28 DIAGNOSIS — J9601 Acute respiratory failure with hypoxia: Secondary | ICD-10-CM | POA: Diagnosis not present

## 2019-03-28 DIAGNOSIS — I5032 Chronic diastolic (congestive) heart failure: Secondary | ICD-10-CM | POA: Diagnosis present

## 2019-03-28 DIAGNOSIS — Z9181 History of falling: Secondary | ICD-10-CM

## 2019-03-28 DIAGNOSIS — E43 Unspecified severe protein-calorie malnutrition: Secondary | ICD-10-CM | POA: Diagnosis present

## 2019-03-28 DIAGNOSIS — Z66 Do not resuscitate: Secondary | ICD-10-CM | POA: Diagnosis present

## 2019-03-28 DIAGNOSIS — J449 Chronic obstructive pulmonary disease, unspecified: Secondary | ICD-10-CM | POA: Diagnosis present

## 2019-03-28 DIAGNOSIS — N183 Chronic kidney disease, stage 3 unspecified: Secondary | ICD-10-CM | POA: Diagnosis present

## 2019-03-28 DIAGNOSIS — E785 Hyperlipidemia, unspecified: Secondary | ICD-10-CM | POA: Diagnosis present

## 2019-03-28 DIAGNOSIS — W19XXXA Unspecified fall, initial encounter: Secondary | ICD-10-CM | POA: Diagnosis present

## 2019-03-28 DIAGNOSIS — F0280 Dementia in other diseases classified elsewhere without behavioral disturbance: Secondary | ICD-10-CM | POA: Diagnosis present

## 2019-03-28 DIAGNOSIS — R296 Repeated falls: Secondary | ICD-10-CM | POA: Diagnosis present

## 2019-03-28 DIAGNOSIS — Z79899 Other long term (current) drug therapy: Secondary | ICD-10-CM

## 2019-03-28 DIAGNOSIS — Y92129 Unspecified place in nursing home as the place of occurrence of the external cause: Secondary | ICD-10-CM

## 2019-03-28 DIAGNOSIS — Z7401 Bed confinement status: Secondary | ICD-10-CM | POA: Diagnosis not present

## 2019-03-28 DIAGNOSIS — I10 Essential (primary) hypertension: Secondary | ICD-10-CM | POA: Diagnosis not present

## 2019-03-28 DIAGNOSIS — Z96642 Presence of left artificial hip joint: Secondary | ICD-10-CM | POA: Diagnosis present

## 2019-03-28 DIAGNOSIS — S72001A Fracture of unspecified part of neck of right femur, initial encounter for closed fracture: Principal | ICD-10-CM | POA: Diagnosis present

## 2019-03-28 DIAGNOSIS — Z993 Dependence on wheelchair: Secondary | ICD-10-CM

## 2019-03-28 DIAGNOSIS — I130 Hypertensive heart and chronic kidney disease with heart failure and stage 1 through stage 4 chronic kidney disease, or unspecified chronic kidney disease: Secondary | ICD-10-CM | POA: Diagnosis present

## 2019-03-28 DIAGNOSIS — Z96653 Presence of artificial knee joint, bilateral: Secondary | ICD-10-CM | POA: Diagnosis present

## 2019-03-28 DIAGNOSIS — M810 Age-related osteoporosis without current pathological fracture: Secondary | ICD-10-CM | POA: Diagnosis present

## 2019-03-28 DIAGNOSIS — Z7901 Long term (current) use of anticoagulants: Secondary | ICD-10-CM

## 2019-03-28 DIAGNOSIS — J438 Other emphysema: Secondary | ICD-10-CM | POA: Diagnosis not present

## 2019-03-28 DIAGNOSIS — Z96612 Presence of left artificial shoulder joint: Secondary | ICD-10-CM | POA: Diagnosis present

## 2019-03-28 DIAGNOSIS — I4510 Unspecified right bundle-branch block: Secondary | ICD-10-CM | POA: Diagnosis present

## 2019-03-28 DIAGNOSIS — Z20828 Contact with and (suspected) exposure to other viral communicable diseases: Secondary | ICD-10-CM | POA: Diagnosis present

## 2019-03-28 DIAGNOSIS — G309 Alzheimer's disease, unspecified: Secondary | ICD-10-CM | POA: Diagnosis present

## 2019-03-28 DIAGNOSIS — Z885 Allergy status to narcotic agent status: Secondary | ICD-10-CM

## 2019-03-28 DIAGNOSIS — Z86711 Personal history of pulmonary embolism: Secondary | ICD-10-CM | POA: Diagnosis not present

## 2019-03-28 DIAGNOSIS — W1830XA Fall on same level, unspecified, initial encounter: Secondary | ICD-10-CM | POA: Diagnosis present

## 2019-03-28 DIAGNOSIS — S72009A Fracture of unspecified part of neck of unspecified femur, initial encounter for closed fracture: Secondary | ICD-10-CM | POA: Diagnosis present

## 2019-03-28 DIAGNOSIS — Z96611 Presence of right artificial shoulder joint: Secondary | ICD-10-CM | POA: Diagnosis present

## 2019-03-28 DIAGNOSIS — Z8781 Personal history of (healed) traumatic fracture: Secondary | ICD-10-CM

## 2019-03-28 DIAGNOSIS — M419 Scoliosis, unspecified: Secondary | ICD-10-CM | POA: Diagnosis present

## 2019-03-28 DIAGNOSIS — F411 Generalized anxiety disorder: Secondary | ICD-10-CM | POA: Diagnosis present

## 2019-03-28 DIAGNOSIS — Z79890 Hormone replacement therapy (postmenopausal): Secondary | ICD-10-CM

## 2019-03-28 DIAGNOSIS — Z79891 Long term (current) use of opiate analgesic: Secondary | ICD-10-CM

## 2019-03-28 DIAGNOSIS — Z8673 Personal history of transient ischemic attack (TIA), and cerebral infarction without residual deficits: Secondary | ICD-10-CM

## 2019-03-28 HISTORY — PX: HIP ARTHROPLASTY: SHX981

## 2019-03-28 LAB — CBC WITH DIFFERENTIAL/PLATELET
Abs Immature Granulocytes: 0.03 10*3/uL (ref 0.00–0.07)
Basophils Absolute: 0 10*3/uL (ref 0.0–0.1)
Basophils Relative: 0 %
Eosinophils Absolute: 0.1 10*3/uL (ref 0.0–0.5)
Eosinophils Relative: 1 %
HCT: 42.4 % (ref 36.0–46.0)
Hemoglobin: 13.4 g/dL (ref 12.0–15.0)
Immature Granulocytes: 0 %
Lymphocytes Relative: 11 %
Lymphs Abs: 1 10*3/uL (ref 0.7–4.0)
MCH: 28.6 pg (ref 26.0–34.0)
MCHC: 31.6 g/dL (ref 30.0–36.0)
MCV: 90.6 fL (ref 80.0–100.0)
Monocytes Absolute: 0.9 10*3/uL (ref 0.1–1.0)
Monocytes Relative: 9 %
Neutro Abs: 7.6 10*3/uL (ref 1.7–7.7)
Neutrophils Relative %: 79 %
Platelets: 213 10*3/uL (ref 150–400)
RBC: 4.68 MIL/uL (ref 3.87–5.11)
RDW: 16.9 % — ABNORMAL HIGH (ref 11.5–15.5)
WBC: 9.6 10*3/uL (ref 4.0–10.5)
nRBC: 0 % (ref 0.0–0.2)

## 2019-03-28 LAB — SARS CORONAVIRUS 2 BY RT PCR (HOSPITAL ORDER, PERFORMED IN ~~LOC~~ HOSPITAL LAB): SARS Coronavirus 2: NEGATIVE

## 2019-03-28 LAB — BASIC METABOLIC PANEL
Anion gap: 10 (ref 5–15)
BUN: 25 mg/dL — ABNORMAL HIGH (ref 8–23)
CO2: 24 mmol/L (ref 22–32)
Calcium: 9.7 mg/dL (ref 8.9–10.3)
Chloride: 108 mmol/L (ref 98–111)
Creatinine, Ser: 1.31 mg/dL — ABNORMAL HIGH (ref 0.44–1.00)
GFR calc Af Amer: 41 mL/min — ABNORMAL LOW (ref >60)
GFR calc non Af Amer: 36 mL/min — ABNORMAL LOW (ref >60)
Glucose, Bld: 127 mg/dL — ABNORMAL HIGH (ref 70–99)
Potassium: 4.2 mmol/L (ref 3.5–5.1)
Sodium: 142 mmol/L (ref 135–145)

## 2019-03-28 LAB — PROTIME-INR
INR: 1.1 (ref 0.8–1.2)
Prothrombin Time: 14.4 seconds (ref 11.4–15.2)

## 2019-03-28 LAB — TYPE AND SCREEN
ABO/RH(D): O POS
Antibody Screen: NEGATIVE

## 2019-03-28 SURGERY — HEMIARTHROPLASTY, HIP, DIRECT ANTERIOR APPROACH, FOR FRACTURE
Anesthesia: General | Site: Hip | Laterality: Right

## 2019-03-28 MED ORDER — SENNA 8.6 MG PO TABS
2.0000 | ORAL_TABLET | Freq: Every day | ORAL | Status: DC
  Administered 2019-03-29 – 2019-03-31 (×3): 17.2 mg via ORAL
  Filled 2019-03-28 (×3): qty 2

## 2019-03-28 MED ORDER — DOCUSATE SODIUM 100 MG PO CAPS: 100.0000 mg | ORAL_CAPSULE | Freq: Two times a day (BID) | ORAL | Status: DC

## 2019-03-28 MED ORDER — METOPROLOL TARTRATE 12.5 MG HALF TABLET
12.5000 mg | ORAL_TABLET | Freq: Two times a day (BID) | ORAL | Status: DC
  Administered 2019-03-29 – 2019-04-01 (×7): 12.5 mg via ORAL
  Filled 2019-03-28 (×7): qty 1

## 2019-03-28 MED ORDER — CITALOPRAM HYDROBROMIDE 20 MG PO TABS
10.0000 mg | ORAL_TABLET | Freq: Every day | ORAL | Status: DC
  Administered 2019-03-29 – 2019-04-01 (×4): 10 mg via ORAL
  Filled 2019-03-28 (×4): qty 1

## 2019-03-28 MED ORDER — PHENOL 1.4 % MT LIQD: 1.0000 | OROMUCOSAL | Status: DC | PRN

## 2019-03-28 MED ORDER — ACETAMINOPHEN 325 MG PO TABS
325.0000 mg | ORAL_TABLET | Freq: Four times a day (QID) | ORAL | Status: DC | PRN
  Administered 2019-03-29 – 2019-03-31 (×2): 650 mg via ORAL
  Filled 2019-03-28 (×2): qty 2

## 2019-03-28 MED ORDER — BUSPIRONE HCL 5 MG PO TABS
5.0000 mg | ORAL_TABLET | Freq: Every day | ORAL | Status: DC
  Administered 2019-03-30 – 2019-04-01 (×3): 5 mg via ORAL
  Filled 2019-03-28 (×4): qty 1

## 2019-03-28 MED ORDER — SODIUM CHLORIDE 0.9 % IV SOLN
INTRAVENOUS | Status: DC
  Administered 2019-03-28 – 2019-03-30 (×3): via INTRAVENOUS

## 2019-03-28 MED ORDER — BISACODYL 5 MG PO TBEC: 5.0000 mg | DELAYED_RELEASE_TABLET | Freq: Every day | ORAL | Status: DC | PRN

## 2019-03-28 MED ORDER — BISACODYL 10 MG RE SUPP: 10.0000 mg | Freq: Every day | RECTAL | Status: DC | PRN

## 2019-03-28 MED ORDER — CEFAZOLIN SODIUM-DEXTROSE 2-4 GM/100ML-% IV SOLN
INTRAVENOUS | Status: AC
  Filled 2019-03-28: qty 100

## 2019-03-28 MED ORDER — RISPERIDONE 0.25 MG PO TABS
0.2500 mg | ORAL_TABLET | Freq: Every day | ORAL | Status: DC
  Administered 2019-03-29 – 2019-03-31 (×3): 0.25 mg via ORAL
  Filled 2019-03-28 (×5): qty 1

## 2019-03-28 MED ORDER — STERILE WATER FOR IRRIGATION IR SOLN
Status: DC | PRN
  Administered 2019-03-28: 1000 mL

## 2019-03-28 MED ORDER — CHLORHEXIDINE GLUCONATE 4 % EX LIQD: 60.0000 mL | Freq: Once | CUTANEOUS | Status: DC

## 2019-03-28 MED ORDER — LACTATED RINGERS IV SOLN
INTRAVENOUS | Status: DC
  Administered 2019-03-28 (×3): via INTRAVENOUS

## 2019-03-28 MED ORDER — METOPROLOL TARTRATE 25 MG PO TABS: 25.0000 mg | ORAL_TABLET | Freq: Two times a day (BID) | ORAL | Status: DC

## 2019-03-28 MED ORDER — SODIUM CHLORIDE 0.9 % IV SOLN
INTRAVENOUS | Status: DC | PRN
  Administered 2019-03-28: 25 ug/min via INTRAVENOUS

## 2019-03-28 MED ORDER — ALBUMIN HUMAN 5 % IV SOLN
INTRAVENOUS | Status: DC | PRN
  Administered 2019-03-28: 11:00:00 via INTRAVENOUS

## 2019-03-28 MED ORDER — METHOCARBAMOL 1000 MG/10ML IJ SOLN
500.0000 mg | Freq: Four times a day (QID) | INTRAVENOUS | Status: DC | PRN
  Filled 2019-03-28: qty 5

## 2019-03-28 MED ORDER — PANTOPRAZOLE SODIUM 40 MG PO TBEC
40.0000 mg | DELAYED_RELEASE_TABLET | Freq: Every day | ORAL | Status: DC
  Administered 2019-03-30 – 2019-04-01 (×3): 40 mg via ORAL
  Filled 2019-03-28 (×3): qty 1

## 2019-03-28 MED ORDER — METOCLOPRAMIDE HCL 5 MG/ML IJ SOLN: 5.0000 mg | Freq: Three times a day (TID) | INTRAMUSCULAR | Status: DC | PRN

## 2019-03-28 MED ORDER — ALBUMIN HUMAN 5 % IV SOLN
INTRAVENOUS | Status: AC
  Filled 2019-03-28: qty 250

## 2019-03-28 MED ORDER — FENTANYL CITRATE (PF) 250 MCG/5ML IJ SOLN
INTRAMUSCULAR | Status: DC | PRN
  Administered 2019-03-28 (×2): 25 ug via INTRAVENOUS

## 2019-03-28 MED ORDER — METHOCARBAMOL 500 MG PO TABS: 500.0000 mg | ORAL_TABLET | Freq: Four times a day (QID) | ORAL | Status: DC | PRN

## 2019-03-28 MED ORDER — HYDROCODONE-ACETAMINOPHEN 5-325 MG PO TABS: 1.0000 | ORAL_TABLET | Freq: Four times a day (QID) | ORAL | Status: DC | PRN

## 2019-03-28 MED ORDER — MENTHOL 3 MG MT LOZG
1.0000 | LOZENGE | OROMUCOSAL | Status: DC | PRN
  Filled 2019-03-28: qty 9

## 2019-03-28 MED ORDER — ALBUMIN HUMAN 5 % IV SOLN
12.5000 g | Freq: Once | INTRAVENOUS | Status: AC
  Administered 2019-03-28: 12.5 g via INTRAVENOUS

## 2019-03-28 MED ORDER — MELATONIN 3 MG PO TABS
3.0000 mg | ORAL_TABLET | Freq: Every day | ORAL | Status: DC
  Administered 2019-03-29 – 2019-03-31 (×3): 3 mg via ORAL
  Filled 2019-03-28 (×5): qty 1

## 2019-03-28 MED ORDER — ASPIRIN EC 325 MG PO TBEC
325.0000 mg | DELAYED_RELEASE_TABLET | Freq: Every day | ORAL | Status: DC
  Administered 2019-03-30 – 2019-04-01 (×3): 325 mg via ORAL
  Filled 2019-03-28 (×4): qty 1

## 2019-03-28 MED ORDER — ROCURONIUM BROMIDE 10 MG/ML (PF) SYRINGE
PREFILLED_SYRINGE | INTRAVENOUS | Status: AC
  Filled 2019-03-28: qty 10

## 2019-03-28 MED ORDER — GLYCOPYRROLATE 0.2 MG/ML IJ SOLN
0.1000 mg | Freq: Once | INTRAMUSCULAR | Status: AC
  Administered 2019-03-28: 0.1 mg via INTRAVENOUS

## 2019-03-28 MED ORDER — MIRTAZAPINE 7.5 MG PO TABS
7.5000 mg | ORAL_TABLET | Freq: Every day | ORAL | Status: DC
  Administered 2019-03-29 – 2019-03-31 (×3): 7.5 mg via ORAL
  Filled 2019-03-28 (×5): qty 1

## 2019-03-28 MED ORDER — ONDANSETRON HCL 4 MG/2ML IJ SOLN: 4.0000 mg | Freq: Four times a day (QID) | INTRAMUSCULAR | Status: DC | PRN

## 2019-03-28 MED ORDER — PHENYLEPHRINE 40 MCG/ML (10ML) SYRINGE FOR IV PUSH (FOR BLOOD PRESSURE SUPPORT)
PREFILLED_SYRINGE | INTRAVENOUS | Status: AC
  Filled 2019-03-28: qty 10

## 2019-03-28 MED ORDER — CEFAZOLIN SODIUM-DEXTROSE 2-4 GM/100ML-% IV SOLN
2.0000 g | INTRAVENOUS | Status: AC
  Administered 2019-03-28: 2 g via INTRAVENOUS

## 2019-03-28 MED ORDER — METOCLOPRAMIDE HCL 5 MG PO TABS: 5.0000 mg | ORAL_TABLET | Freq: Three times a day (TID) | ORAL | Status: DC | PRN

## 2019-03-28 MED ORDER — SUGAMMADEX SODIUM 200 MG/2ML IV SOLN
INTRAVENOUS | Status: DC | PRN
  Administered 2019-03-28: 100 mg via INTRAVENOUS

## 2019-03-28 MED ORDER — SUCRALFATE 1 G PO TABS
1.0000 g | ORAL_TABLET | Freq: Three times a day (TID) | ORAL | Status: DC
  Administered 2019-03-29 – 2019-04-01 (×12): 1 g via ORAL
  Filled 2019-03-28 (×12): qty 1

## 2019-03-28 MED ORDER — FENTANYL CITRATE (PF) 250 MCG/5ML IJ SOLN
INTRAMUSCULAR | Status: AC
  Filled 2019-03-28: qty 5

## 2019-03-28 MED ORDER — CEFAZOLIN SODIUM-DEXTROSE 2-4 GM/100ML-% IV SOLN
2.0000 g | Freq: Four times a day (QID) | INTRAVENOUS | Status: AC
  Administered 2019-03-28 – 2019-03-29 (×2): 2 g via INTRAVENOUS
  Filled 2019-03-28 (×2): qty 100

## 2019-03-28 MED ORDER — MORPHINE SULFATE (PF) 2 MG/ML IV SOLN: 0.5000 mg | INTRAVENOUS | Status: DC | PRN

## 2019-03-28 MED ORDER — FENTANYL CITRATE (PF) 100 MCG/2ML IJ SOLN: 25.0000 ug | INTRAMUSCULAR | Status: DC | PRN

## 2019-03-28 MED ORDER — SUCCINYLCHOLINE CHLORIDE 20 MG/ML IJ SOLN
INTRAMUSCULAR | Status: DC | PRN
  Administered 2019-03-28: 60 mg via INTRAVENOUS

## 2019-03-28 MED ORDER — DEXAMETHASONE SODIUM PHOSPHATE 10 MG/ML IJ SOLN
INTRAMUSCULAR | Status: DC | PRN
  Administered 2019-03-28: 4 mg via INTRAVENOUS

## 2019-03-28 MED ORDER — ONDANSETRON HCL 4 MG/2ML IJ SOLN
INTRAMUSCULAR | Status: AC
  Filled 2019-03-28: qty 2

## 2019-03-28 MED ORDER — POVIDONE-IODINE 10 % EX SWAB: 2.0000 "application " | Freq: Once | CUTANEOUS | Status: DC

## 2019-03-28 MED ORDER — GLYCOPYRROLATE 0.2 MG/ML IJ SOLN
INTRAMUSCULAR | Status: AC
  Filled 2019-03-28: qty 1

## 2019-03-28 MED ORDER — ONDANSETRON HCL 4 MG/2ML IJ SOLN
INTRAMUSCULAR | Status: DC | PRN
  Administered 2019-03-28: 4 mg via INTRAVENOUS

## 2019-03-28 MED ORDER — ONDANSETRON HCL 4 MG PO TABS: 4.0000 mg | ORAL_TABLET | Freq: Four times a day (QID) | ORAL | Status: DC | PRN

## 2019-03-28 MED ORDER — SUCCINYLCHOLINE CHLORIDE 200 MG/10ML IV SOSY
PREFILLED_SYRINGE | INTRAVENOUS | Status: AC
  Filled 2019-03-28: qty 10

## 2019-03-28 MED ORDER — PHENYLEPHRINE HCL (PRESSORS) 10 MG/ML IV SOLN
INTRAVENOUS | Status: DC | PRN
  Administered 2019-03-28: 200 ug via INTRAVENOUS
  Administered 2019-03-28: 120 ug via INTRAVENOUS

## 2019-03-28 MED ORDER — ROCURONIUM BROMIDE 100 MG/10ML IV SOLN
INTRAVENOUS | Status: DC | PRN
  Administered 2019-03-28: 20 mg via INTRAVENOUS
  Administered 2019-03-28: 10 mg via INTRAVENOUS

## 2019-03-28 MED ORDER — PROPOFOL 10 MG/ML IV BOLUS
INTRAVENOUS | Status: DC | PRN
  Administered 2019-03-28: 50 mg via INTRAVENOUS

## 2019-03-28 MED ORDER — PROPOFOL 10 MG/ML IV BOLUS
INTRAVENOUS | Status: AC
  Filled 2019-03-28: qty 20

## 2019-03-28 MED ORDER — ACETAMINOPHEN 500 MG PO TABS: 500.0000 mg | ORAL_TABLET | Freq: Four times a day (QID) | ORAL | 0 refills | Status: AC | PRN

## 2019-03-28 MED ORDER — POLYETHYLENE GLYCOL 3350 17 G PO PACK: 17.0000 g | PACK | Freq: Every day | ORAL | Status: DC | PRN

## 2019-03-28 MED ORDER — DEXAMETHASONE SODIUM PHOSPHATE 10 MG/ML IJ SOLN
INTRAMUSCULAR | Status: AC
  Filled 2019-03-28: qty 1

## 2019-03-28 MED ORDER — SODIUM CHLORIDE 0.9 % IR SOLN
Status: DC | PRN
  Administered 2019-03-28: 1000 mL

## 2019-03-28 MED ORDER — HYDROCODONE-ACETAMINOPHEN 5-325 MG PO TABS
1.0000 | ORAL_TABLET | ORAL | Status: DC | PRN
  Administered 2019-03-31: 1 via ORAL
  Filled 2019-03-28: qty 1

## 2019-03-28 MED ORDER — DOCUSATE SODIUM 100 MG PO CAPS
100.0000 mg | ORAL_CAPSULE | Freq: Two times a day (BID) | ORAL | Status: DC
  Administered 2019-03-30 – 2019-03-31 (×4): 100 mg via ORAL
  Filled 2019-03-28 (×4): qty 1

## 2019-03-28 MED ORDER — POLYETHYLENE GLYCOL 3350 17 G PO PACK
17.0000 g | PACK | Freq: Every day | ORAL | Status: DC
  Filled 2019-03-28: qty 1

## 2019-03-28 SURGICAL SUPPLY — 48 items
BLADE SAW SGTL 73X25 THK (BLADE) ×2 IMPLANT
BRUSH FEMORAL CANAL (MISCELLANEOUS) IMPLANT
COVER SURGICAL LIGHT HANDLE (MISCELLANEOUS) ×2 IMPLANT
COVER WAND RF STERILE (DRAPES) ×2 IMPLANT
DRAPE IMP U-DRAPE 54X76 (DRAPES) ×2 IMPLANT
DRAPE INCISE IOBAN 85X60 (DRAPES) ×6 IMPLANT
DRAPE ORTHO SPLIT 77X108 STRL (DRAPES) ×2
DRAPE SURG ORHT 6 SPLT 77X108 (DRAPES) ×2 IMPLANT
DRAPE U-SHAPE 47X51 STRL (DRAPES) ×2 IMPLANT
DRSG MEPILEX BORDER 4X8 (GAUZE/BANDAGES/DRESSINGS) ×4 IMPLANT
DURAPREP 26ML APPLICATOR (WOUND CARE) ×2 IMPLANT
ELECT BLADE 6.5 EXT (BLADE) ×2 IMPLANT
ELECT CAUTERY BLADE 6.4 (BLADE) ×4 IMPLANT
ELECT REM PT RETURN 9FT ADLT (ELECTROSURGICAL) ×2
ELECTRODE REM PT RTRN 9FT ADLT (ELECTROSURGICAL) ×1 IMPLANT
GAUZE SPONGE 4X4 12PLY STRL (GAUZE/BANDAGES/DRESSINGS) IMPLANT
GLOVE BIO SURGEON STRL SZ7 (GLOVE) ×4 IMPLANT
GLOVE BIOGEL PI IND STRL 7.5 (GLOVE) ×1 IMPLANT
GLOVE BIOGEL PI IND STRL 9 (GLOVE) ×1 IMPLANT
GLOVE BIOGEL PI INDICATOR 7.5 (GLOVE) ×1
GLOVE BIOGEL PI INDICATOR 9 (GLOVE) ×1
GLOVE SURG ORTHO 9.0 STRL STRW (GLOVE) ×4 IMPLANT
GOWN STRL REUS W/ TWL XL LVL3 (GOWN DISPOSABLE) ×1 IMPLANT
GOWN STRL REUS W/TWL XL LVL3 (GOWN DISPOSABLE) ×1
HANDPIECE INTERPULSE COAX TIP (DISPOSABLE) ×1
HEAD FEM UNIPOLAR 45 OD STRL (Hips) ×2 IMPLANT
KIT BASIN OR (CUSTOM PROCEDURE TRAY) ×2 IMPLANT
KIT TURNOVER KIT B (KITS) ×2 IMPLANT
MANIFOLD NEPTUNE II (INSTRUMENTS) ×2 IMPLANT
NS IRRIG 1000ML POUR BTL (IV SOLUTION) ×2 IMPLANT
PACK TOTAL JOINT (CUSTOM PROCEDURE TRAY) ×2 IMPLANT
PACK UNIVERSAL I (CUSTOM PROCEDURE TRAY) ×2 IMPLANT
PAD ARMBOARD 7.5X6 YLW CONV (MISCELLANEOUS) ×4 IMPLANT
PENCIL BUTTON HOLSTER BLD 10FT (ELECTRODE) ×4 IMPLANT
SET HNDPC FAN SPRY TIP SCT (DISPOSABLE) ×1 IMPLANT
SPACER DEPUY (Hips) ×2 IMPLANT
STAPLER VISISTAT 35W (STAPLE) ×2 IMPLANT
STEM BASIC PRESS FIT SZ2 (Stem) ×2 IMPLANT
SUT ETHIBOND NAB CT1 #1 30IN (SUTURE) ×2 IMPLANT
SUT VIC AB 1 CT1 27 (SUTURE) ×1
SUT VIC AB 1 CT1 27XBRD ANBCTR (SUTURE) ×1 IMPLANT
SUT VIC AB 2-0 CTB1 (SUTURE) ×2 IMPLANT
TOWEL GREEN STERILE (TOWEL DISPOSABLE) ×2 IMPLANT
TOWEL GREEN STERILE FF (TOWEL DISPOSABLE) ×2 IMPLANT
TOWER CARTRIDGE SMART MIX (DISPOSABLE) IMPLANT
TRAY FOLEY MTR SLVR 16FR STAT (SET/KITS/TRAYS/PACK) IMPLANT
WATER STERILE IRR 1000ML POUR (IV SOLUTION) ×6 IMPLANT
YANKAUER SUCT BULB TIP NO VENT (SUCTIONS) ×4 IMPLANT

## 2019-03-28 NOTE — Transfer of Care (Signed)
Immediate Anesthesia Transfer of Care Note  Patient: Roberta Griffin  Procedure(s) Performed: ARTHROPLASTY BIPOLAR HIP (HEMIARTHROPLASTY) (Right Hip)  Patient Location: PACU  Anesthesia Type:General  Level of Consciousness: awake, alert , oriented and sedated  Airway & Oxygen Therapy: Patient Spontanous Breathing and Patient connected to face mask oxygen  Post-op Assessment: Report given to RN, Post -op Vital signs reviewed and stable and Patient moving all extremities  Post vital signs: Reviewed and stable  Last Vitals:  Vitals Value Taken Time  BP    Temp    Pulse    Resp    SpO2      Last Pain:  Vitals:   03/28/19 0815  PainSc: Asleep         Complications: No apparent anesthesia complications

## 2019-03-28 NOTE — Op Note (Signed)
03/28/2019  11:48 AM  PATIENT:  Roberta Griffin    PRE-OPERATIVE DIAGNOSIS:  RIGHT FEMORAL NECK FRACTURE  POST-OPERATIVE DIAGNOSIS:  Same  PROCEDURE:  ARTHROPLASTY BIPOLAR HIP (HEMIARTHROPLASTY)  SURGEON:  Newt Minion, MD  PHYSICIAN ASSISTANT:None ANESTHESIA:   General  PREOPERATIVE INDICATIONS:  Roberta Griffin is a  85 y.o. female with a diagnosis of RIGHT FEMORAL NECK FRACTURE who failed conservative measures and elected for surgical management.    The risks benefits and alternatives were discussed with the patient preoperatively including but not limited to the risks of infection, bleeding, nerve injury, cardiopulmonary complications, the need for revision surgery, among others, and the patient was willing to proceed.  OPERATIVE IMPLANTS: 45 mm ball, -3 mm neck, size 2 femoral neck [place] basic implants  @ENCIMAGES @  OPERATIVE FINDINGS: Thin osteoporotic bone with thin atrophic connective tissue  OPERATIVE PROCEDURE: Patient was brought the operating room and underwent a general anesthetic.  After adequate levels anesthesia were obtained patient was placed in the left lateral decubitus position with the right side up the right lower extremity was prepped using DuraPrep draped into a sterile field Ioban was used to cover all exposed skin.  A posterior lateral incision was made this was carried down through the tensor fascia lata which was split.  The piriformis capsule and short external rotators were reflected off the femoral neck and retracted.  The femoral neck fracture was freshened with a cut 1 cm proximal to the calcar.  The ball was removed size for a 4545 trial had a good suction fit.  The femur was sequentially broached to a size 2.  The size 2 femur was inserted the head was inserted and this was reduced without complication.  Patient had good internal and external rotation with internal rotation and abduction of 70 degrees without instability.  The wound was irrigated  with normal saline throughout the case.  The short external rotators and capsule were reapproximated.  The tensor fascia lata was closed using #2 Vicryl.  Subcu was closed using 1 Vicryl.  Skin was closed with staples Mepilex dressing was applied patient was transferred supine a pillow was placed between her legs and extubated taken the PACU in stable condition.   DISCHARGE PLANNING:  Antibiotic duration: 23-hour antibiotics  Weightbearing: Touchdown weightbearing on the right for transfers bed to chair patient will not understand gait training and should not undergo gait training.  Pain medication: Tylenol ordered for pain and patient may have Vicodin for breakthrough pain  Dressing care/ Wound VAC: Dry dressing changes needed  Ambulatory devices: No ambulatory devices  Discharge to: Discharge back to skilled nursing.  Follow-up: In the office 1 week post operative.

## 2019-03-28 NOTE — ED Triage Notes (Signed)
From facility, per EMS yesterday evening patient had unwitnessed?? Fall. Xray done at facility showed right femoral neck displacement. Patient is advanced dementia, bedbound, a+o x1 baseline. Per provider and family wishes patient sent to ED. Per ems they did not get a accurate story of how injury occurred.

## 2019-03-28 NOTE — Anesthesia Postprocedure Evaluation (Signed)
Anesthesia Post Note  Patient: Roberta Griffin  Procedure(s) Performed: ARTHROPLASTY BIPOLAR HIP (HEMIARTHROPLASTY) (Right Hip)     Patient location during evaluation: PACU Anesthesia Type: General Level of consciousness: awake and alert Pain management: pain level controlled Vital Signs Assessment: post-procedure vital signs reviewed and stable Respiratory status: spontaneous breathing, nonlabored ventilation, respiratory function stable and patient connected to nasal cannula oxygen Cardiovascular status: blood pressure returned to baseline and stable Postop Assessment: no apparent nausea or vomiting Anesthetic complications: no    Last Vitals:  Vitals:   03/28/19 1224 03/28/19 1240  BP: 107/81 134/68  Pulse:    Resp: 15 (!) 27  Temp:    SpO2:      Last Pain:  Vitals:   03/28/19 1240  PainSc: 0-No pain                 FITZGERALD,W. EDMOND

## 2019-03-28 NOTE — ED Provider Notes (Signed)
Assumed care of patient at change of shift awaiting hospitalist consult for admission. Discussed with Dr. Lorin Mercy who will consult for admission, NPO, hold Eliquis awaiting ortho eval today.   Tacy Learn, PA-C 03/28/19 [id number]    Gareth Morgan, MD 03/31/19 [phone]

## 2019-03-28 NOTE — Anesthesia Preprocedure Evaluation (Addendum)
Anesthesia Evaluation  Patient identified by MRN, date of birth, ID band Patient confused    Reviewed: Allergy & Precautions, H&P , NPO status , Patient's Chart, lab work & pertinent test results  Airway Mallampati: II  TM Distance: >3 FB Neck ROM: Full    Dental no notable dental hx. (+) Teeth Intact, Dental Advisory Given   Pulmonary COPD,  oxygen dependent,    Pulmonary exam normal breath sounds clear to auscultation       Cardiovascular hypertension, Pt. on medications  Rhythm:Regular Rate:Normal     Neuro/Psych Anxiety Dementia CVA    GI/Hepatic negative GI ROS, Neg liver ROS,   Endo/Other  negative endocrine ROS  Renal/GU Renal InsufficiencyRenal disease  negative genitourinary   Musculoskeletal   Abdominal   Peds  Hematology  (+) Blood dyscrasia, anemia ,   Anesthesia Other Findings   Reproductive/Obstetrics negative OB ROS                            Anesthesia Physical Anesthesia Plan  ASA: III  Anesthesia Plan: General   Post-op Pain Management:    Induction: Intravenous  PONV Risk Score and Plan: 4 or greater and Ondansetron, Dexamethasone and Treatment may vary due to age or medical condition  Airway Management Planned: Oral ETT  Additional Equipment:   Intra-op Plan:   Post-operative Plan: Extubation in OR  Informed Consent: I have reviewed the patients History and Physical, chart, labs and discussed the procedure including the risks, benefits and alternatives for the proposed anesthesia with the patient or authorized representative who has indicated his/her understanding and acceptance.   Patient has DNR.  Discussed DNR with power of attorney and Continue DNR.   Dental advisory given  Plan Discussed with: CRNA  Anesthesia Plan Comments:        Anesthesia Quick Evaluation

## 2019-03-28 NOTE — ED Notes (Signed)
I will collect type and screen and INR when pt returns from xray.

## 2019-03-28 NOTE — H&P (Signed)
History and Physical    Roberta BeringDorothy Polansky YNW:[id number] DOB: 02/08/1926 DOA: 03/28/2019  PCP: Sherron Mondayejan-Sie, S Ahmed, MD Consultants:  [facility] - orthopedics [facility]: [facility]; NOK: Daughter, Abbott PaoSheila Anderson, [phone]  Chief Complaint: Fall  HPI: Roberta Griffin is a 85 y.o. female with medical history significant of HTN; COPD; and dementia presenting after a mechanical fall.   She has advanced dementia and is unable to provide history.    HPI per PA Sanders:  [facility], staff is unsure exactly how she fell. She is generally bedbound and nonambulatory, bed is in the lowest position with pillow surrounding her. She was found on the floor on her right side. She had an x-ray at facility that showed a hip fracture, family and staff elected to send her here for orthopedic evaluation. Patient is on Eliquis. They are unsure regarding any head injury or loss of consciousness. Patient has advanced dementia, A&O x1 at baseline. She had a left hip fracture last year, ORIF by Dr. Cleophas DunkerWhitfield with [facility]. Patient is DNR.  ED Course:  R hip fracture.  Dr. Lajoyce Cornersuda requests NPO, hold Eliquis, ortho to see her.  Review of Systems: Unable to perform   Ambulatory Status:  Bedbound  Past Medical History:  Diagnosis Date   COPD (chronic obstructive pulmonary disease) (HCC)    Dementia (HCC)    Depression    Dysphagia    Hypertension    Scoliosis     Past Surgical History:  Procedure Laterality Date   ABDOMINAL HYSTERECTOMY     HIP ARTHROPLASTY Left 07/29/2018   Procedure: ARTHROPLASTY BIPOLAR HIP (HEMIARTHROPLASTY);  Surgeon: Valeria BatmanWhitfield, Peter W, MD;  [facility];  Service: Orthopedics;  Laterality: Left;   KNEE SURGERY     SHOULDER SURGERY     VITRECTOMY AND CATARACT      Social History   Socioeconomic History   Marital status: Single    Spouse name: Not on file   Number of children: Not on file   Years of education: Not on file    Highest education level: Not on file  Occupational History   Not on file  Social Needs   Financial resource strain: Not on file   Food insecurity    Worry: Not on file    Inability: Not on file   Transportation needs    Medical: Not on file    Non-medical: Not on file  Tobacco Use   Smoking status: Never Smoker   Smokeless tobacco: Never Used  Substance and Sexual Activity   Alcohol use: No   Drug use: No   Sexual activity: Not on file  Lifestyle   Physical activity    Days per week: Not on file    Minutes per session: Not on file   Stress: Not on file  Relationships   Social connections    Talks on phone: Not on file    Gets together: Not on file    Attends religious service: Not on file    Active member of club or organization: Not on file    Attends meetings of clubs or organizations: Not on file    Relationship status: Not on file   Intimate partner violence    Fear of current or ex partner: Not on file    Emotionally abused: Not on file    Physically abused: Not on file    Forced sexual activity: Not on file  Other Topics Concern   Not on file  Social  History Narrative   Not on file    Allergies  Allergen Reactions   Tramadol Other (See Comments)    twitching    Family History  Problem Relation Age of Onset   Stroke Neg Hx     Prior to Admission medications   Medication Sig Start Date End Date Taking? Authorizing Provider  acetaminophen (TYLENOL) 325 MG tablet Take 650 mg by mouth every 8 (eight) hours.   Yes [provider]  acetaminophen (TYLENOL) 500 MG tablet Take 500 mg by mouth every 8 (eight) hours as needed for mild pain.  02/19/18  Yes [provider]  busPIRone (BUSPAR) 15 MG tablet Take 5 mg by mouth daily.  02/06/18  Yes [provider]  citalopram (CELEXA) 10 MG tablet Take 10 mg by mouth daily.   Yes [provider]  docusate sodium (COLACE) 100 MG capsule Take 1 capsule (100 mg total) by  mouth 2 (two) times daily. 07/31/18  Yes Burnadette Pop, MD  Melatonin 3 MG TABS Take 3 mg by mouth at bedtime.  05/18/18  Yes [provider]  metoprolol tartrate (LOPRESSOR) 25 MG tablet Take 25 mg by mouth 2 (two) times daily.   Yes [provider]  mirtazapine (REMERON) 7.5 MG tablet Take 7.5 mg by mouth at bedtime. 10/09/18  Yes [provider]  omeprazole (PRILOSEC) 20 MG capsule Take 20 mg by mouth daily.   Yes [provider]  ondansetron (ZOFRAN) 4 MG tablet Take 4 mg by mouth every 4 (four) hours as needed for nausea or vomiting.   Yes [provider]  polyethylene glycol (MIRALAX) packet Take 17 g by mouth daily as needed for moderate constipation. Patient taking differently: Take 17 g by mouth See admin instructions. Daily and as needed for constipation 07/31/18  Yes Adhikari, Willia Craze, MD  risperiDONE (RISPERDAL) 0.25 MG tablet Take 0.25 mg by mouth at bedtime.    Yes [provider]  senna (SENOKOT) 8.6 MG TABS tablet Take 2 tablets by mouth at bedtime.   Yes [provider]  sucralfate (CARAFATE) 1 g tablet Take 1 g by mouth 4 (four) times daily -  with meals and at bedtime.   Yes [provider]  apixaban (ELIQUIS) 2.5 MG TABS tablet Take 2.5 mg by mouth 2 (two) times daily.    [provider]  HYDROcodone-acetaminophen (NORCO/VICODIN) 5-325 MG tablet Take 1 tablet by mouth every 6 (six) hours as needed for severe pain. Patient not taking: Reported on 03/28/2019 07/31/18   Burnadette Pop, MD  metoprolol succinate (TOPROL-XL) 50 MG 24 hr tablet Take 1 tablet (50 mg total) by mouth daily. Take with or immediately following a meal. Patient not taking: Reported on 03/28/2019 02/09/16   Marily Memos, MD    Physical Exam: Vitals:   03/28/19 0500 03/28/19 0515 03/28/19 0630 03/28/19 0645  BP: (!) 151/78 (!) 154/92 (!) 168/85 (!) 158/87  Pulse:   (!) 48 (!) 56  Resp: 11 (!) 21 18 (!) 22  SpO2:   100% 98%       General:  Appears calm and comfortable and is NAD  Eyes:   EOMI, normal lids, iris  ENT:  grossly normal lips & tongue, mmm; edentulous  Neck:  no LAD, masses or thyromegaly  Cardiovascular:  RRR, no m/r/g. No LE edema.   Respiratory:   CTA bilaterally with no wheezes/rales/rhonchi.  Normal respiratory effort.  Abdomen:  soft, NT, ND, NABS  Skin:  no rash or induration seen  on limited exam  Musculoskeletal:  atrophic BUE/BLE, RLE mildly shortened  Lower extremity:  No LE edema.  Limited foot exam with no ulcerations.  2+ distal pulses.  Psychiatric: alert, pleasant, babbling with nonsensical speech  Neurologic:  Unable to perform    Radiological Exams on Admission: Dg Chest 1 View  Result Date: 03/28/2019 CLINICAL DATA:  Pre-op respiratory exam for left hip fracture. EXAM: CHEST  1 VIEW COMPARISON:  10/11/2018 FINDINGS: Stable cardiomegaly. Aortic atherosclerosis. Stable elevation of right hemidiaphragm. Both lungs are clear. 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 IMPRESSION: Stable cardiomegaly and substernal goiter. No active lung disease. Electronically Signed   By: Danae OrleansJohn A Stahl M.D.   On: 03/28/2019 06:09   Ct Head Wo Contrast  Result Date: 03/28/2019 CLINICAL DATA:  Unwitnessed fall. Trauma to head and neck. On anticoagulation. EXAM: CT HEAD WITHOUT CONTRAST CT CERVICAL SPINE WITHOUT CONTRAST TECHNIQUE: Multidetector CT imaging of the head and cervical spine was performed following the standard protocol without intravenous contrast. Multiplanar CT image reconstructions of the cervical spine were also generated. COMPARISON:  03/12/2019 FINDINGS: CT HEAD FINDINGS Brain: No evidence of acute infarction, hemorrhage, hydrocephalus, extra-axial collection, or mass lesion/mass effect. Stable diffuse cerebral atrophy and chronic small vessel disease. Vascular:  No hyperdense vessel or other acute findings. Skull: No  evidence of fracture or other significant bone abnormality. Sinuses/Orbits:  No acute findings. Other: None. CT CERVICAL SPINE FINDINGS Alignment: Stable grade 1 anterolisthesis at C4-5, attributable to degenerative changes seen at this level. No acute subluxation. Moderate cervical dextroscoliosis is unchanged. Skull base and vertebrae: No acute fracture. No primary bone lesion or focal pathologic process. Soft tissues and spinal canal: No prevertebral fluid or swelling. No visible canal hematoma. Disc levels: Congenital fusion again noted at C3-4. Severe degenerative disc disease seen at C4-5 and to lesser degree at C5-6 and C6-7. Mild facet DJD day noted bilaterally, worse on the left at C2-3. Upper chest: No acute findings. Other: None. IMPRESSION: 1. No acute intracranial abnormality. Stable cerebral atrophy and chronic small vessel disease. 2. No evidence of cervical spine fracture or other acute findings. 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 Electronically Signed   By: Danae OrleansJohn A Stahl M.D.   On: 03/28/2019 06:31   Ct Cervical Spine Wo Contrast  Result Date: 03/28/2019 CLINICAL DATA:  Unwitnessed fall. Trauma to head and neck. On anticoagulation. EXAM: CT HEAD WITHOUT CONTRAST CT CERVICAL SPINE WITHOUT CONTRAST TECHNIQUE: Multidetector CT imaging of the head and cervical spine was performed following the standard protocol without intravenous contrast. Multiplanar CT image reconstructions of the cervical spine were also generated. COMPARISON:  03/12/2019 FINDINGS: CT HEAD FINDINGS Brain: No evidence of acute infarction, hemorrhage, hydrocephalus, extra-axial collection, or mass lesion/mass effect. Stable diffuse cerebral atrophy and chronic small vessel disease. Vascular:  No hyperdense vessel or other acute findings. Skull: No evidence of fracture or other significant bone abnormality. Sinuses/Orbits:  No acute findings. Other: None. CT  CERVICAL SPINE FINDINGS Alignment: Stable grade 1 anterolisthesis at C4-5, attributable to degenerative changes seen at this level. No acute subluxation. Moderate cervical dextroscoliosis is unchanged. Skull base and vertebrae: No acute fracture. No primary bone lesion or focal pathologic process. Soft tissues and spinal canal: No prevertebral fluid or swelling. No visible canal hematoma. Disc levels: Congenital fusion again noted at C3-4. Severe degenerative disc disease seen at C4-5 and to lesser degree at C5-6 and C6-7. Mild facet  DJD day noted bilaterally, worse on the left at C2-3. Upper chest: No acute findings. Other: None. IMPRESSION: 1. No acute intracranial abnormality. Stable cerebral atrophy and chronic small vessel disease. 2. No evidence of cervical spine fracture or other acute findings. 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 Electronically Signed   By: Danae OrleansJohn A Stahl M.D.   On: 03/28/2019 06:31   Dg Hip Unilat W Or Wo Pelvis 2-3 Views Right  Result Date: 03/28/2019 CLINICAL DATA:  Fall yesterday. Right hip pain.  Initial encounter. EXAM: DG HIP (WITH OR WITHOUT PELVIS) 2-3V RIGHT COMPARISON:  None. FINDINGS: A mildly displaced right femoral neck fracture seen. No evidence of dislocation. No pelvic fracture identified. IMPRESSION: Mildly displaced right femoral neck fracture. Electronically Signed   By: Danae OrleansJohn A Stahl M.D.   On: 03/28/2019 06:06    EKG: Independently reviewed.  Junctional rhythm with rate 50; RBBB with artifact but no obvious acute ischemia   Labs on Admission: I have personally reviewed the available labs and imaging studies at the time of the admission.  Pertinent labs:   Glucose 127 BUN 25/Creatinine 1.31/GFR 36 - stable Essentially normal CBC INR 1.1 COVID negative  Assessment/Plan Principal Problem:   Closed hip fracture, right, initial encounter (HCC) Active Problems:   COPD (chronic  obstructive pulmonary disease) (HCC)   Alzheimer's dementia without behavioral disturbance (HCC)   Essential hypertension   CKD (chronic kidney disease) stage 3, GFR 30-59 ml/min (HCC)   HLD (hyperlipidemia)   Chronic diastolic CHF (congestive heart failure) (HCC)   Fall   History of pulmonary embolus (PE)    Closed R hip fracture s/p fall -Mechanical fall resulting in hip fracture -Orthopedics consult this AM -NPO in anticipation of surgical repair  -SCDs for now,resume Eliquis post-operatively (or as per ortho) -Pain control with Robxain, Vicodin, and Morphine prn -SW consult for rehab placement -Will need PT consult post-operatively; however, patient was generally non-ambulatory prior to fall -Hip fracture order set utilized  Dementia -Patient with apparent behavioral disturbance, based on medication list -Will continue home meds, including Buspar, Citalopram, Melatonin, Remeron, and Risperdal  Stage 3 CKD -Appears to be stable -Recheck BMP tomorrow AM  H/o PE -Continue Eliquis when appropriate post-operatively -If no surgery today, she would likely benefit from a Heparin bridge  COPD -She does not appear to be taking medications for this and appears to be compensated at this time  HTN -Continue Lopressor but decrease dose to 12.5 BID - ideally, the medication may need to be held due to bradycardia, but since there is evidence of perioperative benefit with having and detriment with holding, will continue at decreased dose for now  HLD -No longer taking medications for this issue  Chronic diastolic CHF -02/2017 echo with preserved EF and grade 1 diastolic dysfunction -Appears to be compensated at this time     Note: This patient has been tested and is negative for the novel coronavirus COVID-19.      DVT prophylaxis:  SCDs until approved for Eliquis by orthopedics Code Status:  DNR - confirmed with bedside paperwork Family Communication: None present; I  called and left a message for her daughter.  Disposition Plan:  Home once clinically improved Consults called: Orthopedics; SW, Nutrition; will need PT post-operatively  Admission status: Admit - It is my clinical opinion that admission to INPATIENT is reasonable and necessary because of the expectation that this patient will require hospital care that crosses at least 2 midnights  to treat this condition based on the medical complexity of the problems presented.  Given the aforementioned information, the predictability of an adverse outcome is felt to be significant.    Karmen Bongo MD [facility]   How to contact the [facility] Attending or Consulting provider [place] or covering provider during after hours [place], for this patient?  1. Check the care team in [facility] and look for a) attending/consulting [facility] provider listed and b) the [facility] team listed 2. Log into [email or web address] and use [place]'s universal password to access. If you do not have the password, please contact the hospital operator. 3. Locate the [facility] provider you are looking for under [facility] and page to a number that you can be directly reached. 4. If you still have difficulty reaching the provider, please page the [facility] (Director on Call) for the Hospitalists listed on amion for assistance.   03/28/2019, 7:58 AM

## 2019-03-28 NOTE — Anesthesia Procedure Notes (Signed)
Procedure Name: Intubation Date/Time: 03/28/2019 10:22 AM Performed by: Scheryl Darter, CRNA Pre-anesthesia Checklist: Patient identified, Emergency Drugs available, Suction available and Patient being monitored Patient Re-evaluated:Patient Re-evaluated prior to induction Oxygen Delivery Method: Circle System Utilized Preoxygenation: Pre-oxygenation with 100% oxygen Induction Type: IV induction and Rapid sequence Ventilation: Mask ventilation without difficulty Laryngoscope Size: Miller and 2 Grade View: Grade I Tube type: Oral Tube size: 7.0 mm Number of attempts: 1 Airway Equipment and Method: Stylet and Oral airway Placement Confirmation: ETT inserted through vocal cords under direct vision,  positive ETCO2 and breath sounds checked- equal and bilateral Secured at: 21 cm Tube secured with: Tape Dental Injury: Teeth and Oropharynx as per pre-operative assessment

## 2019-03-28 NOTE — Progress Notes (Signed)
I was reviewing the patient's operative course and noted that her HR was consistently in the 40s with BP 61/39.  I went to see the patient in the PACU and Dr. Ola Spurr arrived simultaneously.  He administed glycopyrrolate with subsequent improvement in HR to the 70s and BP back to 113/59.  O2 sats have remained in the 90-92% range with good pleth.  Based on current hemodynamics, bed request has been changed to Progressive Care Unit.  She may be appropriate to return to Med Surg tomorrow pending ongoing clinical stability.   Carlyon Shadow, M.D.

## 2019-03-28 NOTE — ED Provider Notes (Signed)
[facility] EMERGENCY DEPARTMENT Provider Note   CSN: [id number] Arrival date & time: 03/28/19  0449     History   Chief Complaint Chief Complaint  Patient presents with   Hip Injury    HPI Roberta Griffin is a 85 y.o. female.     The history is provided by the patient and medical records.    85 year old female with history of dementia, COPD, depression, hypertension, scoliosis, presenting to the ED after an unwitnessed fall that occurred yesterday.  Patient is from [place], staff is unsure exactly how she fell.  She is generally bedbound and nonambulatory, bed is in the lowest position with pillow surrounding her.  She was found on the floor on her right side.  She had an x-ray at facility that showed a hip fracture, family and staff elected to send her here for orthopedic evaluation.  Patient is on Eliquis.  They are unsure regarding any head injury or loss of consciousness.  Patient has advanced dementia, A&O x1 at baseline.  She had a left hip fracture last year, ORIF by Dr. Durward Fortes with [facility].  Patient is DNR.  Past Medical History:  Diagnosis Date   COPD (chronic obstructive pulmonary disease) ([place])    Dementia ([place])    Depression    Dysphagia    Hypertension    Scoliosis     Patient Active Problem List   Diagnosis Date Noted   Fall 10/12/2018   Hypothermia 10/11/2018   Subcapital fracture of left hip ([place]) 07/29/2018   Hip fracture ([place]) 07/26/2018   Depression with anxiety 07/26/2017   High risk medication use 06/03/2017   Chronic pain of both shoulders 06/03/2017   Schizophrenia ([place]) 04/26/2017   GAD (generalized anxiety disorder) 04/05/2017   History of stroke 12/05/2016   Pulmonary embolus ([place]) 12/05/2016   Chronic diastolic CHF (congestive heart failure) ([place]) 03/04/2016   Insomnia 03/04/2016   Dysarthria 03/04/2016   Thyroid nodule 03/04/2016   Pancreatic mass    HLD (hyperlipidemia)     Dysphagia 02/18/2016   CVA (cerebral vascular accident) ([place]) 02/17/2016   COPD (chronic obstructive pulmonary disease) ([place]) 02/17/2016   Alzheimer's dementia without behavioral disturbance ([place]) 02/17/2016   Essential hypertension 02/17/2016   Normocytic anemia 02/17/2016   CKD (chronic kidney disease) stage 3, GFR 30-59 ml/min ([place]) 02/17/2016    Past Surgical History:  Procedure Laterality Date   ABDOMINAL HYSTERECTOMY     HIP ARTHROPLASTY Left 07/29/2018   Procedure: ARTHROPLASTY BIPOLAR HIP (HEMIARTHROPLASTY);  Surgeon: Garald Balding, MD;  Location: [facility];  Service: Orthopedics;  Laterality: Left;   KNEE SURGERY     SHOULDER SURGERY     VITRECTOMY AND CATARACT       OB History   No obstetric history on file.      Home Medications    Prior to Admission medications   Medication Sig Start Date End Date Taking? Authorizing Provider  acetaminophen (TYLENOL) 500 MG tablet Take 500 mg by mouth every 8 (eight) hours.  02/19/18   [provider]  apixaban (ELIQUIS) 2.5 MG TABS tablet Take 2.5 mg by mouth 2 (two) times daily.    [provider]  busPIRone (BUSPAR) 15 MG tablet Take 15 mg by mouth 2 (two) times daily. 02/06/18   [provider]  citalopram (CELEXA) 10 MG tablet Take 10 mg by mouth daily.    [provider]  divalproex (DEPAKOTE) 125 MG DR tablet Take 125 mg by mouth daily.  [provider]  docusate sodium (COLACE) 100 MG capsule Take 1 capsule (100 mg total) by mouth 2 (two) times daily. 07/31/18   Burnadette PopAdhikari, Amrit, MD  ENSURE (ENSURE) Take 120 mLs by mouth 3 (three) times daily between meals. 04/21/18   [provider]  HYDROcodone-acetaminophen (NORCO/VICODIN) 5-325 MG tablet Take 1 tablet by mouth every 6 (six) hours as needed for severe pain. 07/31/18   Burnadette PopAdhikari, Amrit, MD  Melatonin 3 MG TABS Take 3 mg by mouth at bedtime.  05/18/18   [provider]  memantine (NAMENDA XR) 14 MG CP24 24  hr capsule Take 14 mg by mouth daily.    [provider]  metoprolol succinate (TOPROL-XL) 50 MG 24 hr tablet Take 1 tablet (50 mg total) by mouth daily. Take with or immediately following a meal. Patient taking differently: Take 25 mg by mouth daily. Take with or immediately following a meal. 02/09/16   Mesner, Barbara CowerJason, MD  mirtazapine (REMERON) 7.5 MG tablet Take 7.5 mg by mouth at bedtime. 10/09/18   [provider]  Nutritional Supplements (NUTRITIONAL SUPPLEMENT PO) Take 1 Can by mouth 3 (three) times daily.     [provider]  polyethylene glycol (MIRALAX) packet Take 17 g by mouth daily as needed for moderate constipation. 07/31/18   Burnadette PopAdhikari, Amrit, MD  risperiDONE (RISPERDAL) 0.25 MG tablet Take 0.25 mg by mouth at bedtime.     [provider]  Vitamin D, Ergocalciferol, (DRISDOL) 50000 units CAPS capsule Take 50,000 Units by mouth every 30 (thirty) days.     [provider]    Family History Family History  Problem Relation Age of Onset   Stroke Neg Hx     Social History Social History   Tobacco Use   Smoking status: Never Smoker   Smokeless tobacco: Never Used  Substance Use Topics   Alcohol use: No   Drug use: No     Allergies   Tramadol   Review of Systems Review of Systems  Unable to perform ROS: Other     Physical Exam Updated Vital Signs BP (!) 158/87    Pulse (!) 56    Resp (!) 22    SpO2 98%   Physical Exam Vitals signs and nursing note reviewed.  Constitutional:      Appearance: She is well-developed.     Comments: Elderly, thin, frail  HENT:     Head: Normocephalic and atraumatic.  Eyes:     Conjunctiva/sclera: Conjunctivae normal.     Pupils: Pupils are equal, round, and reactive to light.  Neck:     Musculoskeletal: Normal range of motion.  Cardiovascular:     Rate and Rhythm: Normal rate and regular rhythm.     Heart sounds: Normal heart sounds.  Pulmonary:     Effort: Pulmonary effort is  normal.     Breath sounds: Normal breath sounds. No wheezing or rhonchi.  Abdominal:     General: Bowel sounds are normal.     Palpations: Abdomen is soft.  Musculoskeletal: Normal range of motion.     Comments: Right hip is TTP, leg is slightly shortened and externally rotated, DP pulse intact, moves foot and toes normally  Skin:    General: Skin is warm and dry.  Neurological:     Mental Status: She is alert.     Comments: Awake, responds to name but not aware of place, time, situation      ED Treatments / Results  Labs (all labs ordered are  listed, but only abnormal results are displayed) Labs Reviewed  CBC WITH DIFFERENTIAL/PLATELET - Abnormal; Notable for the following components:      Result Value   RDW 16.9 (*)    All other components within normal limits  BASIC METABOLIC PANEL - Abnormal; Notable for the following components:   Glucose, Bld 127 (*)    BUN 25 (*)    Creatinine, Ser 1.31 (*)    GFR calc non Af Amer 36 (*)    GFR calc Af Amer 41 (*)    All other components within normal limits  SARS CORONAVIRUS 2 (HOSPITAL ORDER, PERFORMED IN [facility])  PROTIME-INR  TYPE AND SCREEN    EKG None  Radiology Dg Chest 1 View  Result Date: 03/28/2019 CLINICAL DATA:  Pre-op respiratory exam for left hip fracture. EXAM: CHEST  1 VIEW COMPARISON:  10/11/2018 FINDINGS: Stable cardiomegaly. Aortic atherosclerosis. Stable elevation of right hemidiaphragm. Both lungs are clear. 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 IMPRESSION: Stable cardiomegaly and substernal goiter. No active lung disease. Electronically Signed   By: Danae OrleansJohn A Stahl M.D.   On: 03/28/2019 06:09   Ct Head Wo Contrast  Result Date: 03/28/2019 CLINICAL DATA:  Unwitnessed fall. Trauma to head and neck. On anticoagulation. EXAM: CT HEAD WITHOUT CONTRAST CT CERVICAL SPINE WITHOUT CONTRAST TECHNIQUE: Multidetector CT imaging of the head and cervical  spine was performed following the standard protocol without intravenous contrast. Multiplanar CT image reconstructions of the cervical spine were also generated. COMPARISON:  03/12/2019 FINDINGS: CT HEAD FINDINGS Brain: No evidence of acute infarction, hemorrhage, hydrocephalus, extra-axial collection, or mass lesion/mass effect. Stable diffuse cerebral atrophy and chronic small vessel disease. Vascular:  No hyperdense vessel or other acute findings. Skull: No evidence of fracture or other significant bone abnormality. Sinuses/Orbits:  No acute findings. Other: None. CT CERVICAL SPINE FINDINGS Alignment: Stable grade 1 anterolisthesis at C4-5, attributable to degenerative changes seen at this level. No acute subluxation. Moderate cervical dextroscoliosis is unchanged. Skull base and vertebrae: No acute fracture. No primary bone lesion or focal pathologic process. Soft tissues and spinal canal: No prevertebral fluid or swelling. No visible canal hematoma. Disc levels: Congenital fusion again noted at C3-4. Severe degenerative disc disease seen at C4-5 and to lesser degree at C5-6 and C6-7. Mild facet DJD day noted bilaterally, worse on the left at C2-3. Upper chest: No acute findings. Other: None. IMPRESSION: 1. No acute intracranial abnormality. Stable cerebral atrophy and chronic small vessel disease. 2. No evidence of cervical spine fracture or other acute findings. 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 Electronically Signed   By: Danae OrleansJohn A Stahl M.D.   On: 03/28/2019 06:31   Ct Cervical Spine Wo Contrast  Result Date: 03/28/2019 CLINICAL DATA:  Unwitnessed fall. Trauma to head and neck. On anticoagulation. EXAM: CT HEAD WITHOUT CONTRAST CT CERVICAL SPINE WITHOUT CONTRAST TECHNIQUE: Multidetector CT imaging of the head and cervical spine was performed following the standard protocol without intravenous contrast. Multiplanar CT image  reconstructions of the cervical spine were also generated. COMPARISON:  03/12/2019 FINDINGS: CT HEAD FINDINGS Brain: No evidence of acute infarction, hemorrhage, hydrocephalus, extra-axial collection, or mass lesion/mass effect. Stable diffuse cerebral atrophy and chronic small vessel disease. Vascular:  No hyperdense vessel or other acute findings. Skull: No evidence of fracture or other significant bone abnormality. Sinuses/Orbits:  No acute findings. Other: None. CT CERVICAL SPINE FINDINGS Alignment: Stable grade  1 anterolisthesis at C4-5, attributable to degenerative changes seen at this level. No acute subluxation. Moderate cervical dextroscoliosis is unchanged. Skull base and vertebrae: No acute fracture. No primary bone lesion or focal pathologic process. Soft tissues and spinal canal: No prevertebral fluid or swelling. No visible canal hematoma. Disc levels: Congenital fusion again noted at C3-4. Severe degenerative disc disease seen at C4-5 and to lesser degree at C5-6 and C6-7. Mild facet DJD day noted bilaterally, worse on the left at C2-3. Upper chest: No acute findings. Other: None. IMPRESSION: 1. No acute intracranial abnormality. Stable cerebral atrophy and chronic small vessel disease. 2. No evidence of cervical spine fracture or other acute findings. 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 Electronically Signed   By: Danae OrleansJohn A Stahl M.D.   On: 03/28/2019 06:31   Dg Hip Unilat W Or Wo Pelvis 2-3 Views Right  Result Date: 03/28/2019 CLINICAL DATA:  Fall yesterday. Right hip pain.  Initial encounter. EXAM: DG HIP (WITH OR WITHOUT PELVIS) 2-3V RIGHT COMPARISON:  None. FINDINGS: A mildly displaced right femoral neck fracture seen. No evidence of dislocation. No pelvic fracture identified. IMPRESSION: Mildly displaced right femoral neck fracture. Electronically Signed   By: Danae OrleansJohn A Stahl M.D.   On: 03/28/2019 06:06     Procedures Procedures (including critical care time)  Medications Ordered in ED Medications - No data to display   Initial Impression / Assessment and Plan / ED Course  I have reviewed the triage vital signs and the nursing notes.  Pertinent labs & imaging results that were available during my care of the patient were reviewed by me and considered in my medical decision making (see chart for details).  85 year old female status post fall that occurred yesterday.  Unclear events surrounding fall as he was found on the floor by the nursing staff at her facility.  She has a history of frequent falls.  Unsure regarding head injury or loss of consciousness.  She is anticoagulated with Eliquis.  She had screening x-ray at facility concerning for right femoral neck fracture.  These images are unavailable through PACS.  Will repeat films still as well as CT of the head and neck, screening labs also sent.      5:14 AM Spoke with patient's daughter, Roberta HatchetSheila-- states patient does ambulate, however she falls quite a bit.  States she does not want her to be completely bed bound if she can help it, however also does not want to cause her pain.  She would like to discuss with orthopedics regarding her options/recommendations from their standpoint.  X-ray does confirm right femoral neck fracture.  Screening labs reassuring.  CT head still pending.  Will discuss with on call orthopedics for [facility].  COVID screen ordered.  6:13 AM Spoke with Dr. Lajoyce Cornersuda-- ortho will consult this morning.  Keep NPO, hold Eliquis.  Medicine will be consulted for admission.  Final Clinical Impressions(s) / ED Diagnoses   Final diagnoses:  Closed fracture of right hip, initial encounter [facility](HCC)    ED Discharge Orders    None       Garlon HatchetSanders, Mercedees Convery M, PA-C 03/28/19 [id number]    Gilda CreasePollina, Christopher J, MD 03/28/19 [phone]

## 2019-03-28 NOTE — Consult Note (Addendum)
ORTHOPAEDIC CONSULTATION  REQUESTING PHYSICIAN: Roberta Griffin Griffin, Roberta Griffin Griffin, Roberta Griffin Griffin  Chief Complaint: Right femoral neck fracture.  HPI: Roberta Griffin Griffin is a 85 y.o. female who presents with right femoral neck fracture.  Patient has dementia and had a mechanical fall.  She is status post left hip bipolar hemiarthroplasty.  She is also status post bilateral shoulder arthroplasties and bilateral knee arthroplasties.  Past Medical History:  Diagnosis Date   COPD (chronic obstructive pulmonary disease) (HCC)    Dementia (HCC)    Depression    Dysphagia    Hypertension    Scoliosis    Past Surgical History:  Procedure Laterality Date   ABDOMINAL HYSTERECTOMY     HIP ARTHROPLASTY Left 07/29/2018   Procedure: ARTHROPLASTY BIPOLAR HIP (HEMIARTHROPLASTY);  Surgeon: Roberta Griffin Griffin, Roberta Griffin Griffin, Roberta Griffin Griffin;  [facility];  Service: Orthopedics;  Laterality: Left;   KNEE SURGERY     SHOULDER SURGERY     VITRECTOMY AND CATARACT     Social History   Socioeconomic History   Marital status: Single    Spouse name: Not on file   Number of children: Not on file   Years of education: Not on file   Highest education level: Not on file  Occupational History   Not on file  Social Needs   Financial resource strain: Not on file   Food insecurity    Worry: Not on file    Inability: Not on file   Transportation needs    Medical: Not on file    Non-medical: Not on file  Tobacco Use   Smoking status: Never Smoker   Smokeless tobacco: Never Used  Substance and Sexual Activity   Alcohol use: No   Drug use: No   Sexual activity: Not on file  Lifestyle   Physical activity    Days per week: Not on file    Minutes per session: Not on file   Stress: Not on file  Relationships   Social connections    Talks on phone: Not on file    Gets together: Not on file    Attends religious service: Not on file    Active member of club or organization: Not on file    Attends meetings of clubs or  organizations: Not on file    Relationship status: Not on file  Other Topics Concern   Not on file  Social History Narrative   Not on file   Family History  Problem Relation Age of Onset   Stroke Neg Hx    - negative except otherwise stated in the family history section Allergies  Allergen Reactions   Tramadol Other (See Comments)    twitching   Prior to Admission medications   Medication Sig Start Date End Date Taking? Authorizing Provider  acetaminophen (TYLENOL) 500 MG tablet Take 500 mg by mouth every 8 (eight) hours as needed for mild pain.  02/19/18  Yes Provider, Historical, Roberta Griffin Griffin  busPIRone (BUSPAR) 15 MG tablet Take 5 mg by mouth daily.  02/06/18  Yes Provider, Historical, Roberta Griffin Griffin  citalopram (CELEXA) 10 MG tablet Take 10 mg by mouth daily.   Yes Provider, Historical, Roberta Griffin Griffin  docusate sodium (COLACE) 100 MG capsule Take 1 capsule (100 mg total) by mouth 2 (two) times daily. 07/31/18  Yes Roberta Griffin Griffin, Roberta Griffin, Roberta Griffin Griffin  Melatonin 3 MG TABS Take 3 mg by mouth at bedtime.  05/18/18  Yes Provider, Historical, Roberta Griffin Griffin  metoprolol tartrate (LOPRESSOR) 25 MG tablet Take 25 mg by mouth 2 (two) times daily.   Yes Provider,  Historical, Roberta Griffin Griffin  mirtazapine (REMERON) 7.5 MG tablet Take 7.5 mg by mouth at bedtime. 10/09/18  Yes Provider, Historical, Roberta Griffin Griffin  omeprazole (PRILOSEC) 20 MG capsule Take 20 mg by mouth daily.   Yes Provider, Historical, Roberta Griffin Griffin  ondansetron (ZOFRAN) 4 MG tablet Take 4 mg by mouth every 4 (four) hours as needed for nausea or vomiting.   Yes Provider, Historical, Roberta Griffin Griffin  polyethylene glycol (MIRALAX) packet Take 17 g by mouth daily as needed for moderate constipation. Patient taking differently: Take 17 g by mouth See admin instructions. Daily and as needed for constipation 07/31/18  Yes Adhikari, Roberta Griffin Griffin, Roberta Griffin Griffin  risperiDONE (RISPERDAL) 0.25 MG tablet Take 0.25 mg by mouth at bedtime.    Yes Provider, Historical, Roberta Griffin Griffin  senna (SENOKOT) 8.6 MG TABS tablet Take 2 tablets by mouth at bedtime.   Yes Provider, Historical,  Roberta Griffin Griffin  sucralfate (CARAFATE) 1 g tablet Take 1 g by mouth 4 (four) times daily -  with meals and at bedtime.   Yes Provider, Historical, Roberta Griffin Griffin  apixaban (ELIQUIS) 2.5 MG TABS tablet Take 2.5 mg by mouth 2 (two) times daily.    Provider, Historical, Roberta Griffin Griffin   Dg Chest 1 View  Result Date: 03/28/2019 CLINICAL DATA:  Pre-op respiratory exam for left hip fracture. EXAM: CHEST  1 VIEW COMPARISON:  10/11/2018 FINDINGS: Stable cardiomegaly. Aortic atherosclerosis. Stable elevation of right hemidiaphragm. Both lungs are clear. 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 IMPRESSION: Stable cardiomegaly and substernal goiter. No active lung disease. Electronically Signed   By: Roberta Griffin Griffin M.D.   On: 03/28/2019 06:09   Ct Head Wo Contrast  Result Date: 03/28/2019 CLINICAL DATA:  Unwitnessed fall. Trauma to head and neck. On anticoagulation. EXAM: CT HEAD WITHOUT CONTRAST CT CERVICAL SPINE WITHOUT CONTRAST TECHNIQUE: Multidetector CT imaging of the head and cervical spine was performed following the standard protocol without intravenous contrast. Multiplanar CT image reconstructions of the cervical spine were also generated. COMPARISON:  03/12/2019 FINDINGS: CT HEAD FINDINGS Brain: No evidence of acute infarction, hemorrhage, hydrocephalus, extra-axial collection, or mass lesion/mass effect. Stable diffuse cerebral atrophy and chronic small vessel disease. Vascular:  No hyperdense vessel or other acute findings. Skull: No evidence of fracture or other significant bone abnormality. Sinuses/Orbits:  No acute findings. Other: None. CT CERVICAL SPINE FINDINGS Alignment: Stable grade 1 anterolisthesis at C4-5, attributable to degenerative changes seen at this level. No acute subluxation. Moderate cervical dextroscoliosis is unchanged. Skull base and vertebrae: No acute fracture. No primary bone lesion or focal pathologic process. Soft tissues and spinal canal: No prevertebral fluid or  swelling. No visible canal hematoma. Disc levels: Congenital fusion again noted at C3-4. Severe degenerative disc disease seen at C4-5 and to lesser degree at C5-6 and C6-7. Mild facet DJD day noted bilaterally, worse on the left at C2-3. Upper chest: No acute findings. Other: None. IMPRESSION: 1. No acute intracranial abnormality. Stable cerebral atrophy and chronic small vessel disease. 2. No evidence of cervical spine fracture or other acute findings. 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 Electronically Signed   By: Roberta Griffin Griffin M.D.   On: 03/28/2019 06:31   Ct Cervical Spine Wo Contrast  Result Date: 03/28/2019 CLINICAL DATA:  Unwitnessed fall. Trauma to head and neck. On anticoagulation. EXAM: CT HEAD WITHOUT CONTRAST CT CERVICAL SPINE WITHOUT CONTRAST TECHNIQUE: Multidetector CT imaging of the head and cervical spine was performed following the standard protocol without intravenous contrast. Multiplanar  CT image reconstructions of the cervical spine were also generated. COMPARISON:  03/12/2019 FINDINGS: CT HEAD FINDINGS Brain: No evidence of acute infarction, hemorrhage, hydrocephalus, extra-axial collection, or mass lesion/mass effect. Stable diffuse cerebral atrophy and chronic small vessel disease. Vascular:  No hyperdense vessel or other acute findings. Skull: No evidence of fracture or other significant bone abnormality. Sinuses/Orbits:  No acute findings. Other: None. CT CERVICAL SPINE FINDINGS Alignment: Stable grade 1 anterolisthesis at C4-5, attributable to degenerative changes seen at this level. No acute subluxation. Moderate cervical dextroscoliosis is unchanged. Skull base and vertebrae: No acute fracture. No primary bone lesion or focal pathologic process. Soft tissues and spinal canal: No prevertebral fluid or swelling. No visible canal hematoma. Disc levels: Congenital fusion again noted at C3-4. Severe degenerative disc  disease seen at C4-5 and to lesser degree at C5-6 and C6-7. Mild facet DJD day noted bilaterally, worse on the left at C2-3. Upper chest: No acute findings. Other: None. IMPRESSION: 1. No acute intracranial abnormality. Stable cerebral atrophy and chronic small vessel disease. 2. No evidence of cervical spine fracture or other acute findings. 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 Electronically Signed   By: Roberta Griffin Hind M.D.   On: 03/28/2019 06:31   Dg Hip Unilat Griffin Or Wo Pelvis 2-3 Views Right  Result Date: 03/28/2019 CLINICAL DATA:  Fall yesterday. Right hip pain.  Initial encounter. EXAM: DG HIP (WITH OR WITHOUT PELVIS) 2-3V RIGHT COMPARISON:  None. FINDINGS: A mildly displaced right femoral neck fracture seen. No evidence of dislocation. No pelvic fracture identified. IMPRESSION: Mildly displaced right femoral neck fracture. Electronically Signed   By: Roberta Griffin Hind M.D.   On: 03/28/2019 06:06   - pertinent xrays, CT, MRI studies were reviewed and independently interpreted  Positive ROS: All other systems have been reviewed and were otherwise negative with the exception of those mentioned in the HPI and as above.  Physical Exam: General: Alert only oriented to person, no acute distress Psychiatric: Patient is not competent for consent with flat mood and affect Lymphatic: No axillary or cervical lymphadenopathy Cardiovascular: No pedal edema Respiratory: No cyanosis, no use of accessory musculature GI: No organomegaly, abdomen is soft and non-tender    Images:  @ENCIMAGES @  Labs:  Lab Results  Component Value Date   HGBA1C 5.6 02/18/2016   REPTSTATUS 02/19/2016 FINAL 02/17/2016   CULT MULTIPLE SPECIES PRESENT, SUGGEST RECOLLECTION (A) 02/17/2016    Lab Results  Component Value Date   ALBUMIN 3.7 10/11/2018   ALBUMIN 3.3 (L) 02/12/2017   ALBUMIN 3.5 02/17/2016    Neurologic: Patient does  have protective  sensation bilateral lower extremities.   MUSCULOSKELETAL:   Skin: Patient is thin and cachectic her skin is intact.  Patient has capillary refill less than 3 seconds in the right lower extremity she has pain with flexion extension internal and external rotation of the right hip.  Patient radiographs shows a displaced femoral neck fracture on the right.  Patient's coronavirus to test is negative. CBC stable hemoglobin 13.4, INR 1.1 patient has been on Eliquis. Assessment: Assessment: 85 year old woman with dementia and a acute right femoral neck fracture.  Plan: Plan: I discussed treatment options with her daughter with a hemiarthroplasty to provide comfort for transfers.  Discussed with patient's advanced age and multiple medical problems she is increased risk of complications with surgery including infection neurovascular injury persistent pain dislocation nonhealing of the incision need for additional surgery.  Risk of mortality with  surgery, patient's daughter states she understands and wishes to proceed with surgery at this time.  Thank you for the consult and the opportunity to see [name] ReefStanley  Francessca Friis, Roberta Griffin Griffin [facility] [phone] 8:16 AM

## 2019-03-29 LAB — CBC
HCT: 27.1 % — ABNORMAL LOW (ref 36.0–46.0)
Hemoglobin: 8.8 g/dL — ABNORMAL LOW (ref 12.0–15.0)
MCH: 29.4 pg (ref 26.0–34.0)
MCHC: 32.5 g/dL (ref 30.0–36.0)
MCV: 90.6 fL (ref 80.0–100.0)
Platelets: 185 10*3/uL (ref 150–400)
RBC: 2.99 MIL/uL — ABNORMAL LOW (ref 3.87–5.11)
RDW: 16.7 % — ABNORMAL HIGH (ref 11.5–15.5)
WBC: 7.9 10*3/uL (ref 4.0–10.5)
nRBC: 0 % (ref 0.0–0.2)

## 2019-03-29 LAB — BASIC METABOLIC PANEL
Anion gap: 10 (ref 5–15)
BUN: 24 mg/dL — ABNORMAL HIGH (ref 8–23)
CO2: 22 mmol/L (ref 22–32)
Calcium: 8.6 mg/dL — ABNORMAL LOW (ref 8.9–10.3)
Chloride: 111 mmol/L (ref 98–111)
Creatinine, Ser: 1.24 mg/dL — ABNORMAL HIGH (ref 0.44–1.00)
GFR calc Af Amer: 44 mL/min — ABNORMAL LOW (ref >60)
GFR calc non Af Amer: 38 mL/min — ABNORMAL LOW (ref >60)
Glucose, Bld: 115 mg/dL — ABNORMAL HIGH (ref 70–99)
Potassium: 4.3 mmol/L (ref 3.5–5.1)
Sodium: 143 mmol/L (ref 135–145)

## 2019-03-29 NOTE — Plan of Care (Signed)
Poc progressing.  

## 2019-03-29 NOTE — Evaluation (Signed)
Physical Therapy Evaluation Patient Details Name: Roberta BeringDorothy Griffin MRN: [id number] DOB: 11/12/1925 Today's Date: 03/29/2019   History of Present Illness  85 y.o. female with medical history significant of HTN; COPD; and advanced dementia presenting after a mechanical fall. Xray revealed R hip fx. She underwent hemiarthroplasty 03/28/19.    Clinical Impression  Pt admitted with above diagnosis. [facility], non-ambulatory with frequent falls due to getting up on her own. On eval, she required total assist bed mobility. Attempted supine to sit but pt required return to supine due to pushing back into extension. Pt with eyes closed throughout session, yelling/moaning and resisting with any attempts at mobility. Pt currently with functional limitations due to the deficits listed below (see PT Problem List). Pt will benefit from skilled PT to increase their independence and safety with mobility to allow discharge to the venue listed below.      Follow Up Recommendations SNF    Equipment Recommendations  None recommended by PT    Recommendations for Other Services       Precautions / Restrictions Precautions Precautions: Fall;Posterior Hip Precaution Comments: Pt unable to participate in education due to advanced dementia. Precautions handout posted in pt room. Restrictions Weight Bearing Restrictions: Yes RLE Weight Bearing: Weight bearing as tolerated Other Position/Activity Restrictions: Per Dr. Robyne Griffin progress note 03/29/19, can WBAT RLE for transfers only.      Mobility  Bed Mobility Overal bed mobility: Needs Assistance Bed Mobility: Rolling;Supine to Sit;Sit to Supine Rolling: Total assist   Supine to sit: Total assist Sit to supine: Total assist   General bed mobility comments: Attempted supine to sit without success. Progressed through approx 50% of transfer, when pt started pushing back into extension requiring return to bed. Pt screaming during  mobility. Difficult to assess if due to pain or advanced dementia. All verbalizations immediately stop when mobility ceases.  Transfers                 General transfer comment: unable  Ambulation/Gait             General Gait Details: non-ambulatory at baseline  Stairs            Wheelchair Mobility    Modified Rankin (Stroke Patients Only)       Balance                                             Pertinent Vitals/Pain Pain Assessment: Faces Faces Pain Scale: Hurts whole lot Pain Location: RLE with mobility Pain Descriptors / Indicators: Moaning;Grimacing;Guarding;Crying Pain Intervention(s): Limited activity within patient's tolerance;Repositioned    Home Living Family/patient expects to be discharged to:: Skilled nursing facility                 Additional Comments: [facility]    Prior Function Level of Independence: Needs assistance   Gait / Transfers Assistance Needed: non-ambulatory at baseline. Recent history of frequent falls.  ADL's / Homemaking Assistance Needed: dependent on facility staff        Hand Dominance        Extremity/Trunk Assessment   Upper Extremity Assessment Upper Extremity Assessment: RUE deficits/detail;LUE deficits/detail;Generalized weakness RUE Deficits / Details: limited shoulder ROM bilat LUE Deficits / Details: limited shoulder ROM bilat    Lower Extremity Assessment Lower Extremity Assessment: Generalized weakness;RLE deficits/detail RLE Deficits /  Details: s/p hemiarthroplasty, posterior precautions    Cervical / Trunk Assessment Cervical / Trunk Assessment: Kyphotic  Communication   Communication: Expressive difficulties  Cognition Arousal/Alertness: Lethargic Behavior During Therapy: Agitated(with attempts at mobility) Overall Cognitive Status: History of cognitive impairments - at baseline                                 General  Comments: advanced dementia at baseline      General Comments      Exercises Other Exercises Other Exercises: PROM BLE, within hip precaution parameters RLE. No active participation noted from pt.   Assessment/Plan    PT Assessment Patient needs continued PT services  PT Problem List Decreased strength;Decreased mobility;Decreased safety awareness;Decreased range of motion;Decreased knowledge of precautions;Decreased activity tolerance;Decreased cognition;Pain;Decreased balance       PT Treatment Interventions Therapeutic activities;Therapeutic exercise;Patient/family education;Functional mobility training    PT Goals (Current goals can be found in the Care Plan section)  Acute Rehab PT Goals Patient Stated Goal: unable to state PT Goal Formulation: Patient unable to participate in goal setting Time For Goal Achievement: 04/12/19 Potential to Achieve Goals: Poor    Frequency Min 2X/week   Barriers to discharge        Co-evaluation               AM-PAC PT "6 Clicks" Mobility  Outcome Measure Help needed turning from your back to your side while in a flat bed without using bedrails?: Total Help needed moving from lying on your back to sitting on the side of a flat bed without using bedrails?: Total Help needed moving to and from a bed to a chair (including a wheelchair)?: Total Help needed standing up from a chair using your arms (e.g., wheelchair or bedside chair)?: Total Help needed to walk in hospital room?: Total Help needed climbing 3-5 steps with a railing? : Total 6 Click Score: 6    End of Session   Activity Tolerance: Patient limited by pain;Treatment limited secondary to agitation Patient left: in bed;with bed alarm set;with call bell/phone within reach Nurse Communication: Mobility status;Precautions PT Visit Diagnosis: Other abnormalities of gait and mobility (R26.89);Muscle weakness (generalized) (M62.81);Pain Pain - Right/Left: Right Pain - part of  body: Hip    Time: 1346-1401 PT Time Calculation (min) (ACUTE ONLY): 15 min   Charges:   PT Evaluation $PT Eval Moderate Complexity: 1 Mod          Lorrin Goodell, PT  Office # [phone] Pager [phone]   Roberta Griffin 03/29/2019, 2:40 PM

## 2019-03-29 NOTE — Progress Notes (Signed)
Patient ID: Roberta Griffin, female   DOB: March 08, 1926, 85 y.o.   MRN: [id number] Postoperative day 1 right hip hemiarthroplasty.  The dressing is clean and dry the leg is out to length patient has no complaints of pain.  Patient may transfer from bed to chair weightbearing as tolerated on the right leg for transfers.  Patient does not have the ability to understand gait training and gait training should not be attempted.  Patient is a very high fall risk.

## 2019-03-29 NOTE — Progress Notes (Addendum)
Progress Note   Roberta Griffin  Chief Complaint: Fall  HPI: Roberta Griffin is a 85 y.o. female with medical history significant of HTN; COPD; and dementia presenting after a mechanical fall.   She has advanced dementia and is unable to provide history. HPI per PA Sanders: "Patient is from [facility], staff is unsure exactly how she fell. She is generally bedbound and nonambulatory, bed is in the lowest position with pillow surrounding her. She was found on the floor on her right side. She had an x-ray at facility that showed a hip fracture, family and staff elected to send her here for orthopedic evaluation. Patient is on Eliquis. They are unsure regarding any head injury or loss of consciousness. Patient has advanced dementia, A&O x1 at baseline. She had a left hip fracture last year, ORIF by Dr. Cleophas DunkerWhitfield with [facility]. Patient is DNR."   In ED imaging noted R hip fracture.  Dr. Lajoyce Cornersuda requests NPO, hold Eliquis, ortho to see her.  Subjective: Difficult to obtain secondary to patient's baseline dementia, appears well, declines any pain, shortness of breath.  Assessment/Plan Principal Problem:   Closed hip fracture, right, initial encounter [facility](HCC) Active Problems:   COPD (chronic obstructive pulmonary disease) (HCC)   Alzheimer's dementia without behavioral disturbance (HCC)   Essential hypertension   CKD (chronic kidney disease) stage 3, GFR 30-59 ml/min (HCC)   HLD (hyperlipidemia)   Chronic diastolic CHF (congestive heart failure) (HCC)   Fall   History of pulmonary embolus (PE)   Hip fracture (HCC)   Acute fall with subsequent closed R hip fracture, POA, s/p fixation 03/28/19 -Orthopedics following, appreciate insight and recommendations -Resume diet, advance as tolerated -SCDs  for now,resume Eliquis post-operatively (or as per ortho) -Pain control with Robxain, Vicodin, and Morphine prn -appears well controlled currently -SW consult for rehab placement -Follow PT, patient was generally non-ambulatory prior to fall - wheelchair bound mostly but does not follow commands and gets up without warning - PT to hopefully increase strength and reduce fall risk as patient will likely attempt to ambulate despite our recommendations -Hip fracture order set utilized  Acute hypoxic respiratory failure in the setting of surgery as above, resolved  -Patient on supplemental oxygen overnight postoperatively, likely in the setting of anesthesia and advanced age, CKD -Patient now on room air, will follow oxygen walk screen if able in the next 24 to 48 hours prior to discharge to ensure no ongoing exertional hypoxia  Dementia, at baseline -Patient with apparent behavioral disturbance, based on medication list -Will continue home meds, including Buspar, Citalopram, Melatonin, Remeron, and Risperdal  Anemia, acute blood loss on chronic anemia of chronic disease given CKD below  -Continue to follow with morning labs, no overt signs or symptoms of bleeding at this point -For to Ortho when to resume Eliquis  -Follow closely given drop from 13-8 perioperatively  CKD 3, at baseline -Appears to be stable -Recheck BMP tomorrow AM  H/o PE, POA, stable -Continue Eliquis when appropriate post-operatively -If no surgery today, she would likely benefit from a Heparin bridge  COPD, no acute exacerbation -She does not appear to be taking medications for this and appears to be compensated at this time  HTN, essential -Continue Lopressor but decrease dose to 12.5 BID - ideally, the medication may need to be held due to bradycardia, but since  there is evidence of perioperative benefit with having and detriment with holding, will continue at decreased dose for now  HLD -No longer taking  medications for this issue  Chronic diastolic CHF, no in acute exacerbation; EF 60-65% (02/14/17) -Preserved EF and grade 1 diastolic dysfunction -Appears to be compensated at this time -Continue to follow I/Os  DVT prophylaxis:  SCDs until cleared for Eliquis by orthopedics Code Status:  DNR - confirmed with bedside paperwork Family Communication: Discussed with daughter Velna HatchetSheila over the phone Disposition Plan:  Pending clinical improvement, pain control; PT/OT and surgical clearance Consults called: Orthopedics; SW, Nutrition; will need PT post-operatively  Admission status: Patient continues to require post-op care, IV pain control, and close monitoring peri-operatively. Suspect ultimate disposition home in 24-48h pending clinical course and PT/OT recs as well as post op pain control.   Prior to Admission medications   Medication Sig Start Date End Date Taking? Authorizing Provider  acetaminophen (TYLENOL) 325 MG tablet Take 650 mg by mouth every 8 (eight) hours.   Yes [provider]  acetaminophen (TYLENOL) 500 MG tablet Take 500 mg by mouth every 8 (eight) hours as needed for mild pain.  02/19/18  Yes [provider]  busPIRone (BUSPAR) 15 MG tablet Take 5 mg by mouth daily.  02/06/18  Yes [provider]  citalopram (CELEXA) 10 MG tablet Take 10 mg by mouth daily.   Yes [provider]  docusate sodium (COLACE) 100 MG capsule Take 1 capsule (100 mg total) by mouth 2 (two) times daily. 07/31/18  Yes Burnadette PopAdhikari, Amrit, MD  Melatonin 3 MG TABS Take 3 mg by mouth at bedtime.  05/18/18  Yes [provider]  metoprolol tartrate (LOPRESSOR) 25 MG tablet Take 25 mg by mouth 2 (two) times daily.   Yes [provider]  mirtazapine (REMERON) 7.5 MG tablet Take 7.5 mg by mouth at bedtime. 10/09/18  Yes [provider]  omeprazole (PRILOSEC) 20 MG capsule Take 20 mg by mouth daily.   Yes [provider]  ondansetron (ZOFRAN) 4 MG  tablet Take 4 mg by mouth every 4 (four) hours as needed for nausea or vomiting.   Yes [provider]  polyethylene glycol (MIRALAX) packet Take 17 g by mouth daily as needed for moderate constipation. Patient taking differently: Take 17 g by mouth See admin instructions. Daily and as needed for constipation 07/31/18  Yes Adhikari, Willia CrazeAmrit, MD  risperiDONE (RISPERDAL) 0.25 MG tablet Take 0.25 mg by mouth at bedtime.    Yes [provider]  senna (SENOKOT) 8.6 MG TABS tablet Take 2 tablets by mouth at bedtime.   Yes [provider]  sucralfate (CARAFATE) 1 g tablet Take 1 g by mouth 4 (four) times daily -  with meals and at bedtime.   Yes [provider]  apixaban (ELIQUIS) 2.5 MG TABS tablet Take 2.5 mg by mouth 2 (two) times daily.    [provider]  HYDROcodone-acetaminophen (NORCO/VICODIN) 5-325 MG tablet Take 1 tablet by mouth every 6 (six) hours as needed for severe pain. Patient not taking: Reported on 03/28/2019 07/31/18   Burnadette PopAdhikari, Amrit, MD  metoprolol succinate (TOPROL-XL) 50 MG 24 hr tablet Take 1 tablet (50 mg total) by mouth daily. Take with or immediately following a meal. Patient not taking: Reported on 03/28/2019 02/09/16   Marily MemosMesner, Jason, MD    Physical Exam: Vitals:   03/28/19 1800 03/28/19 2003 03/28/19 2336 03/29/19 0410  BP:  (!) 96/53 (!) 100/50 (!) 95/47  Pulse: (!) 51 (!) 53 61 69  Resp: 10 11 15 14   Temp: (!) 96.5 F (35.8 C) (!) 97.4 F (36.3 C) 98.7 F (37.1 C) 97.9 F (36.6 C)  TempSrc: Axillary Axillary Axillary Oral  SpO2: 100% 100% 99% 98%  Weight:         General:  Appears calm and comfortable and is NAD  Eyes:   EOMI, normal lids, iris  ENT:  grossly normal lips & tongue, mmm; edentulous  Neck:  no LAD, masses or thyromegaly  Cardiovascular:  RRR, no m/r/g. No LE edema.   Respiratory:   CTA bilaterally with no wheezes/rales/rhonchi.  Normal respiratory effort.  Abdomen:  soft, NT, ND, NABS  Skin:  no  rash or induration seen on limited exam  Musculoskeletal:  atrophic BUE/BLE, RLE mildly shortened  Lower extremity:  No LE edema.  Limited foot exam with no ulcerations.  2+ distal pulses. R hip bandage clean/dry/intact.  Psychiatric: alert, pleasant, babbling with nonsensical speech  Neurologic:  Unable to perform   Radiological Exams on Admission: Dg Chest 1 View  Result Date: 03/28/2019 CLINICAL DATA:  Pre-op respiratory exam for left hip fracture. EXAM: CHEST  1 VIEW COMPARISON:  10/11/2018 FINDINGS: Stable cardiomegaly. Aortic atherosclerosis. Stable elevation of right hemidiaphragm. Both lungs are clear. 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 IMPRESSION: Stable cardiomegaly and substernal goiter. No active lung disease. Electronically Signed   By: Danae OrleansJohn A Stahl M.D.   On: 03/28/2019 06:09   Ct Head Wo Contrast  Result Date: 03/28/2019 CLINICAL DATA:  Unwitnessed fall. Trauma to head and neck. On anticoagulation. EXAM: CT HEAD WITHOUT CONTRAST CT CERVICAL SPINE WITHOUT CONTRAST TECHNIQUE: Multidetector CT imaging of the head and cervical spine was performed following the standard protocol without intravenous contrast. Multiplanar CT image reconstructions of the cervical spine were also generated. COMPARISON:  03/12/2019 FINDINGS: CT HEAD FINDINGS Brain: No evidence of acute infarction, hemorrhage, hydrocephalus, extra-axial collection, or mass lesion/mass effect. Stable diffuse cerebral atrophy and chronic small vessel disease. Vascular:  No hyperdense vessel or other acute findings. Skull: No evidence of fracture or other significant bone abnormality. Sinuses/Orbits:  No acute findings. Other: None. CT CERVICAL SPINE FINDINGS Alignment: Stable grade 1 anterolisthesis at C4-5, attributable to degenerative changes seen at this level. No acute subluxation. Moderate cervical dextroscoliosis is unchanged. Skull base and vertebrae: No acute  fracture. No primary bone lesion or focal pathologic process. Soft tissues and spinal canal: No prevertebral fluid or swelling. No visible canal hematoma. Disc levels: Congenital fusion again noted at C3-4. Severe degenerative disc disease seen at C4-5 and to lesser degree at C5-6 and C6-7. Mild facet DJD day noted bilaterally, worse on the left at C2-3. Upper chest: No acute findings. Other: None. IMPRESSION: 1. No acute intracranial abnormality. Stable cerebral atrophy and chronic small vessel disease. 2. No evidence of cervical spine fracture or other acute findings. 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 Electronically Signed   By: Danae OrleansJohn A Stahl M.D.   On: 03/28/2019 06:31   Ct Cervical Spine Wo Contrast  Result Date: 03/28/2019 CLINICAL DATA:  Unwitnessed fall. Trauma to head and neck. On anticoagulation. EXAM: CT HEAD WITHOUT CONTRAST CT CERVICAL SPINE WITHOUT CONTRAST TECHNIQUE: Multidetector CT imaging of the head and cervical spine was performed following the standard protocol without intravenous contrast. Multiplanar CT image reconstructions of the cervical spine were also generated. COMPARISON:  03/12/2019 FINDINGS: CT HEAD  FINDINGS Brain: No evidence of acute infarction, hemorrhage, hydrocephalus, extra-axial collection, or mass lesion/mass effect. Stable diffuse cerebral atrophy and chronic small vessel disease. Vascular:  No hyperdense vessel or other acute findings. Skull: No evidence of fracture or other significant bone abnormality. Sinuses/Orbits:  No acute findings. Other: None. CT CERVICAL SPINE FINDINGS Alignment: Stable grade 1 anterolisthesis at C4-5, attributable to degenerative changes seen at this level. No acute subluxation. Moderate cervical dextroscoliosis is unchanged. Skull base and vertebrae: No acute fracture. No primary bone lesion or focal pathologic process. Soft tissues and spinal canal: No prevertebral fluid or  swelling. No visible canal hematoma. Disc levels: Congenital fusion again noted at C3-4. Severe degenerative disc disease seen at C4-5 and to lesser degree at C5-6 and C6-7. Mild facet DJD day noted bilaterally, worse on the left at C2-3. Upper chest: No acute findings. Other: None. IMPRESSION: 1. No acute intracranial abnormality. Stable cerebral atrophy and chronic small vessel disease. 2. No evidence of cervical spine fracture or other acute findings. 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 Electronically Signed   By: Danae Orleans M.D.   On: 03/28/2019 06:31   Dg Hip Unilat W Or Wo Pelvis 2-3 Views Right  Result Date: 03/28/2019 CLINICAL DATA:  Fall yesterday. Right hip pain.  Initial encounter. EXAM: DG HIP (WITH OR WITHOUT PELVIS) 2-3V RIGHT COMPARISON:  None. FINDINGS: A mildly displaced right femoral neck fracture seen. No evidence of dislocation. No pelvic fracture identified. IMPRESSION: Mildly displaced right femoral neck fracture. Electronically Signed   By: Danae Orleans M.D.   On: 03/28/2019 06:06    EKG: Independently reviewed.  Junctional rhythm with rate 50; RBBB with artifact but no obvious acute ischemia  Data Reviewed: I have personally reviewed following labs and imaging studies  CBC: Recent Labs  Lab 03/28/19 0521 03/29/19 0310  WBC 9.6 7.9  NEUTROABS 7.6  --   HGB 13.4 8.8*  HCT 42.4 27.1*  MCV 90.6 90.6  PLT 213 185   Basic Metabolic Panel: Recent Labs  Lab 03/28/19 0521 03/29/19 0310  NA 142 143  K 4.2 4.3  CL 108 111  CO2 24 22  GLUCOSE 127* 115*  BUN 25* 24*  CREATININE 1.31* 1.24*  CALCIUM 9.7 8.6*   GFR: Estimated Creatinine Clearance: 22.9 mL/min (A) (by C-G formula based on SCr of 1.24 mg/dL (H)). Liver Function Tests: No results for input(s): AST, ALT, ALKPHOS, BILITOT, PROT, ALBUMIN in the last 168 hours. No results for input(s): LIPASE, AMYLASE in the last 168 hours. No results  for input(s): AMMONIA in the last 168 hours. Coagulation Profile: Recent Labs  Lab 03/28/19 0622  INR 1.1   Cardiac Enzymes: No results for input(s): CKTOTAL, CKMB, CKMBINDEX, TROPONINI in the last 168 hours. BNP (last 3 results) No results for input(s): PROBNP in the last 8760 hours. HbA1C: No results for input(s): HGBA1C in the last 72 hours. CBG: No results for input(s): GLUCAP in the last 168 hours. Lipid Profile: No results for input(s): CHOL, HDL, LDLCALC, TRIG, CHOLHDL, LDLDIRECT in the last 72 hours. Thyroid Function Tests: No results for input(s): TSH, T4TOTAL, FREET4, T3FREE, THYROIDAB in the last 72 hours. Anemia Panel: No results for input(s): VITAMINB12, FOLATE, FERRITIN, TIBC, IRON, RETICCTPCT in the last 72 hours. Sepsis Labs: No results for input(s): PROCALCITON, LATICACIDVEN in the last 168 hours.  Recent Results (from the past 240 hour(s))  SARS Coronavirus 2 [facility] order, Performed in [facility] hospital lab) Nasopharyngeal  Nasopharyngeal Swab     Status: None   Collection Time: 03/28/19  5:20 AM   Specimen: Nasopharyngeal Swab  Result Value Ref Range Status   SARS Coronavirus 2 NEGATIVE NEGATIVE Final    Comment: (NOTE) If result is NEGATIVE SARS-CoV-2 target nucleic acids are NOT DETECTED. The SARS-CoV-2 RNA is generally detectable in upper and lower  respiratory specimens during the acute phase of infection. The lowest  concentration of SARS-CoV-2 viral copies this assay can detect is 250  copies / mL. A negative result does not preclude SARS-CoV-2 infection  and should not be used as the sole basis for treatment or other  patient management decisions.  A negative result may occur with  improper specimen collection / handling, submission of specimen other  than nasopharyngeal swab, presence of viral mutation(s) within the  areas targeted by this assay, and inadequate number of viral copies  (<250 copies / mL). A negative result must be combined with  clinical  observations, patient history, and epidemiological information. If result is POSITIVE SARS-CoV-2 target nucleic acids are DETECTED. The SARS-CoV-2 RNA is generally detectable in upper and lower  respiratory specimens dur ing the acute phase of infection.  Positive  results are indicative of active infection with SARS-CoV-2.  Clinical  correlation with patient history and other diagnostic information is  necessary to determine patient infection status.  Positive results do  not rule out bacterial infection or co-infection with other viruses. If result is PRESUMPTIVE POSTIVE SARS-CoV-2 nucleic acids MAY BE PRESENT.   A presumptive positive result was obtained on the submitted specimen  and confirmed on repeat testing.  While 2019 novel coronavirus  (SARS-CoV-2) nucleic acids may be present in the submitted sample  additional confirmatory testing may be necessary for epidemiological  and / or clinical management purposes  to differentiate between  SARS-CoV-2 and other Sarbecovirus currently known to infect humans.  If clinically indicated additional testing with an alternate test  methodology [phone]) is advised. The SARS-CoV-2 RNA is generally  detectable in upper and lower respiratory sp ecimens during the acute  phase of infection. The expected result is Negative. Fact Sheet for Patients:  StrictlyIdeas.no Fact Sheet for Healthcare Providers: BankingDealers.co.za This test is not yet approved or cleared by the [place] FDA and has been authorized for detection and/or diagnosis of SARS-CoV-2 by FDA under an Emergency Use Authorization (EUA).  This EUA will remain in effect (meaning this test can be used) for the duration of the COVID-19 declaration under Section 564(b)(1) of the Act, 21 U.S.C. section 360bbb-3(b)(1), unless the authorization is terminated or revoked sooner. Performed at [facility], [place]  [address] [place],  [address]          Radiology Studies: Dg Chest 1 View  Result Date: 03/28/2019 CLINICAL DATA:  Pre-op respiratory exam for left hip fracture. EXAM: CHEST  1 VIEW COMPARISON:  10/11/2018 FINDINGS: Stable cardiomegaly. Aortic atherosclerosis. Stable elevation of right hemidiaphragm. Both lungs are clear. 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 IMPRESSION: Stable cardiomegaly and substernal goiter. No active lung disease. Electronically Signed   By: Marlaine Hind M.D.   On: 03/28/2019 06:09   Ct Head Wo Contrast  Result Date: 03/28/2019 CLINICAL DATA:  Unwitnessed fall. Trauma to head and neck. On anticoagulation. EXAM: CT HEAD WITHOUT CONTRAST CT CERVICAL SPINE WITHOUT CONTRAST TECHNIQUE: Multidetector CT imaging of the head and cervical spine was performed following the standard protocol without intravenous  contrast. Multiplanar CT image reconstructions of the cervical spine were also generated. COMPARISON:  03/12/2019 FINDINGS: CT HEAD FINDINGS Brain: No evidence of acute infarction, hemorrhage, hydrocephalus, extra-axial collection, or mass lesion/mass effect. Stable diffuse cerebral atrophy and chronic small vessel disease. Vascular:  No hyperdense vessel or other acute findings. Skull: No evidence of fracture or other significant bone abnormality. Sinuses/Orbits:  No acute findings. Other: None. CT CERVICAL SPINE FINDINGS Alignment: Stable grade 1 anterolisthesis at C4-5, attributable to degenerative changes seen at this level. No acute subluxation. Moderate cervical dextroscoliosis is unchanged. Skull base and vertebrae: No acute fracture. No primary bone lesion or focal pathologic process. Soft tissues and spinal canal: No prevertebral fluid or swelling. No visible canal hematoma. Disc levels: Congenital fusion again noted at C3-4. Severe degenerative disc disease seen at C4-5 and to lesser degree at C5-6 and C6-7. Mild  facet DJD day noted bilaterally, worse on the left at C2-3. Upper chest: No acute findings. Other: None. IMPRESSION: 1. No acute intracranial abnormality. Stable cerebral atrophy and chronic small vessel disease. 2. No evidence of cervical spine fracture or other acute findings. 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 Electronically Signed   By: Danae Orleans M.D.   On: 03/28/2019 06:31   Ct Cervical Spine Wo Contrast  Result Date: 03/28/2019 CLINICAL DATA:  Unwitnessed fall. Trauma to head and neck. On anticoagulation. EXAM: CT HEAD WITHOUT CONTRAST CT CERVICAL SPINE WITHOUT CONTRAST TECHNIQUE: Multidetector CT imaging of the head and cervical spine was performed following the standard protocol without intravenous contrast. Multiplanar CT image reconstructions of the cervical spine were also generated. COMPARISON:  03/12/2019 FINDINGS: CT HEAD FINDINGS Brain: No evidence of acute infarction, hemorrhage, hydrocephalus, extra-axial collection, or mass lesion/mass effect. Stable diffuse cerebral atrophy and chronic small vessel disease. Vascular:  No hyperdense vessel or other acute findings. Skull: No evidence of fracture or other significant bone abnormality. Sinuses/Orbits:  No acute findings. Other: None. CT CERVICAL SPINE FINDINGS Alignment: Stable grade 1 anterolisthesis at C4-5, attributable to degenerative changes seen at this level. No acute subluxation. Moderate cervical dextroscoliosis is unchanged. Skull base and vertebrae: No acute fracture. No primary bone lesion or focal pathologic process. Soft tissues and spinal canal: No prevertebral fluid or swelling. No visible canal hematoma. Disc levels: Congenital fusion again noted at C3-4. Severe degenerative disc disease seen at C4-5 and to lesser degree at C5-6 and C6-7. Mild facet DJD day noted bilaterally, worse on the left at C2-3. Upper chest: No acute findings. Other: None. IMPRESSION:  1. No acute intracranial abnormality. Stable cerebral atrophy and chronic small vessel disease. 2. No evidence of cervical spine fracture or other acute findings. 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 Electronically Signed   By: Danae Orleans M.D.   On: 03/28/2019 06:31   Dg Hip Unilat W Or Wo Pelvis 2-3 Views Right  Result Date: 03/28/2019 CLINICAL DATA:  Fall yesterday. Right hip pain.  Initial encounter. EXAM: DG HIP (WITH OR WITHOUT PELVIS) 2-3V RIGHT COMPARISON:  None. FINDINGS: A mildly displaced right femoral neck fracture seen. No evidence of dislocation. No pelvic fracture identified. IMPRESSION: Mildly displaced right femoral neck fracture. Electronically Signed   By: Danae Orleans M.D.   On: 03/28/2019 06:06   Scheduled Meds:  aspirin EC  325 mg Oral Q breakfast   busPIRone  5 mg Oral Daily   citalopram  10 mg Oral Daily   docusate  sodium  100 mg Oral BID   Melatonin  3 mg Oral QHS   metoprolol tartrate  12.5 mg Oral BID   mirtazapine  7.5 mg Oral QHS   pantoprazole  40 mg Oral Daily   polyethylene glycol  17 g Oral Daily   risperiDONE  0.25 mg Oral QHS   senna  2 tablet Oral QHS   sucralfate  1 g Oral TID WC & HS   Continuous Infusions:  sodium chloride 50 mL/hr at 03/28/19 2057   lactated ringers 10 mL/hr at 03/28/19 1806   methocarbamol (ROBAXIN) IV      LOS: 1 day   Time spent:  Azucena Fallen, DO [facility]  If 7PM-7AM, please contact night-coverage [email or web address] Password [facility] 03/29/2019, 7:44 AM

## 2019-03-29 NOTE — Progress Notes (Signed)
Updated pt's daughter Desma Mcgregor via phone.

## 2019-03-30 ENCOUNTER — Encounter (HOSPITAL_COMMUNITY): Payer: Self-pay | Admitting: Orthopedic Surgery

## 2019-03-30 LAB — CBC
HCT: 27 % — ABNORMAL LOW (ref 36.0–46.0)
Hemoglobin: 8.6 g/dL — ABNORMAL LOW (ref 12.0–15.0)
MCH: 29.8 pg (ref 26.0–34.0)
MCHC: 31.9 g/dL (ref 30.0–36.0)
MCV: 93.4 fL (ref 80.0–100.0)
Platelets: 197 10*3/uL (ref 150–400)
RBC: 2.89 MIL/uL — ABNORMAL LOW (ref 3.87–5.11)
RDW: 17.3 % — ABNORMAL HIGH (ref 11.5–15.5)
WBC: 8.1 10*3/uL (ref 4.0–10.5)
nRBC: 0 % (ref 0.0–0.2)

## 2019-03-30 LAB — BASIC METABOLIC PANEL
Anion gap: 8 (ref 5–15)
BUN: 20 mg/dL (ref 8–23)
CO2: 23 mmol/L (ref 22–32)
Calcium: 8.5 mg/dL — ABNORMAL LOW (ref 8.9–10.3)
Chloride: 112 mmol/L — ABNORMAL HIGH (ref 98–111)
Creatinine, Ser: 1.21 mg/dL — ABNORMAL HIGH (ref 0.44–1.00)
GFR calc Af Amer: 45 mL/min — ABNORMAL LOW (ref >60)
GFR calc non Af Amer: 39 mL/min — ABNORMAL LOW (ref >60)
Glucose, Bld: 104 mg/dL — ABNORMAL HIGH (ref 70–99)
Potassium: 3.6 mmol/L (ref 3.5–5.1)
Sodium: 143 mmol/L (ref 135–145)

## 2019-03-30 MED ORDER — ENSURE ENLIVE PO LIQD
237.0000 mL | Freq: Two times a day (BID) | ORAL | Status: DC
  Administered 2019-03-30 – 2019-04-01 (×3): 237 mL via ORAL

## 2019-03-30 NOTE — Progress Notes (Signed)
Initial Nutrition Assessment  DOCUMENTATION CODES:   Severe malnutrition in context of chronic illness, Underweight  INTERVENTION:  Provide Ensure Enlive po BID, each supplement provides 350 kcal and 20 grams of protein  Encourage adequate PO intake.   NUTRITION DIAGNOSIS:   Severe Malnutrition related to chronic illness(COPD) as evidenced by estimated needs.  GOAL:   Patient will meet greater than or equal to 90% of their needs  MONITOR:   PO intake, Skin, Weight trends, Labs, I & O's  REASON FOR ASSESSMENT:   Consult Hip fracture protocol  ASSESSMENT:   85 y.o. female with medical history significant of HTN; COPD; and dementia presenting after a mechanical fall.   She has advanced dementia and is unable to provide history. Imaging noted R hip fracture.   PROCEDURE (8/15):  ARTHROPLASTY BIPOLAR HIP (HEMIARTHROPLASTY)  Pt with advanced dementia and does not communicate. RD unable to obtain pt nutrition history at this time. Meal completion has been varied from 10-75%. Per weight records pt with a 10% weight loss in 6 months. RD to order nutritional supplements to aid in caloric and protein needs.   NUTRITION - FOCUSED PHYSICAL EXAM:    Most Recent Value  Orbital Region  Unable to assess  Upper Arm Region  Severe depletion  Thoracic and Lumbar Region  Unable to assess  Buccal Region  Unable to assess  Temple Region  Unable to assess  Clavicle Bone Region  Severe depletion  Clavicle and Acromion Bone Region  Severe depletion  Scapular Bone Region  Unable to assess  Dorsal Hand  Unable to assess  Patellar Region  Moderate depletion  Anterior Thigh Region  Moderate depletion  Posterior Calf Region  Unable to assess  Edema (RD Assessment)  None  Hair  Reviewed  Eyes  Reviewed  Mouth  Reviewed  Skin  Reviewed  Nails  Reviewed     Labs and medications reviewed.   Diet Order:   Diet Order            DIET SOFT Room service appropriate? Yes with Assist; Fluid  consistency: Thin  Diet effective now              EDUCATION NEEDS:   Not appropriate for education at this time  Skin:  Skin Assessment: Skin Integrity Issues: Skin Integrity Issues:: Incisions Incisions: R hip  Last BM:  8/17  Height:   Ht Readings from Last 1 Encounters:  10/11/18 5\' 5"  (1.651 m)    Weight:   Wt Readings from Last 1 Encounters:  03/28/19 49 kg    Ideal Body Weight:  56.8 kg  BMI:  Body mass index is 17.98 kg/m.  Estimated Nutritional Needs:   Kcal:  1450-1650  Protein:  70-80 grams  Fluid:  >/= 1.5 L/day    Corrin Parker, MS, RD, LDN Pager # [phone] After hours/ weekend pager # [phone]

## 2019-03-30 NOTE — Evaluation (Signed)
Clinical/Bedside Swallow Evaluation Patient Details  Name: Roberta Griffin MRN: [id number] Date of Birth: 07/22/1926  Today's Date: 03/30/2019 Time: SLP Start Time (ACUTE ONLY): 1006 SLP Stop Time (ACUTE ONLY): 1018 SLP Time Calculation (min) (ACUTE ONLY): 12 min  Past Medical History:  Past Medical History:  Diagnosis Date  . COPD (chronic obstructive pulmonary disease) (HCC)   . Dementia (HCC)   . Depression   . Dysphagia   . Hypertension   . Scoliosis    Past Surgical History:  Past Surgical History:  Procedure Laterality Date  . ABDOMINAL HYSTERECTOMY    . HIP ARTHROPLASTY Left 07/29/2018   Procedure: ARTHROPLASTY BIPOLAR HIP (HEMIARTHROPLASTY);  Surgeon: Valeria BatmanWhitfield, Peter W, MD;  Location: [facility] OR;  Service: Orthopedics;  Laterality: Left;  . KNEE SURGERY    . SHOULDER SURGERY    . VITRECTOMY AND CATARACT     HPI:  Pt is a 85 y.o. female with medical history significant of HTN; COPD; dysphagia; scoliosis; and advanced dementia presenting after a mechanical fall. Xray revealed R hip fx. She underwent hemiarthroplasty 03/28/19. Per MD order, swallow evaluation was ordered as pt was only taking pills crushed in puree, has a h/o dementia, and is edentulous. Per RN, she is reportedly on a mechanical soft diet at baseline.   Assessment / Plan / Recommendation Clinical Impression  Pt has a cognitively-based oral dysphagia, characterized by poor awareness for bolus acceptance and occasional, transient oral holding. She keeps her eyes closed throughout the duration of the evaluation, requiring Mod verbal/tactile cues to appropriately accept boluses, but her oral preparation and swallow initiation occurs with improved automaticity. No overt s/s of aspiration were observed during the evaluation, but several minutes after completion, while SLP was documenting outside the room, delayed coughing was overheard. Question a possible esophageal component given age, but at this time would maintain  current diet with use of aspiration and esophageal precautions. SLP to f/u briefly for tolerance given aforementioned symptoms, current deconditioning, and cognitive impairment.  SLP Visit Diagnosis: Dysphagia, oral phase (R13.11)    Aspiration Risk  Mild aspiration risk    Diet Recommendation Dysphagia 3 (Mech soft);Thin liquid   Liquid Administration via: Straw Medication Administration: Crushed with puree Supervision: Staff to assist with self feeding;Full supervision/cueing for compensatory strategies Compensations: Minimize environmental distractions;Slow rate;Small sips/bites;Follow solids with liquid Postural Changes: Seated upright at 90 degrees;Remain upright for at least 30 minutes after po intake    Other  Recommendations Oral Care Recommendations: Oral care BID   Follow up Recommendations 24 hour supervision/assistance      Frequency and Duration min 2x/week  1 week       Prognosis Prognosis for Safe Diet Advancement: (this appears to be her baseline diet)      Swallow Study   General HPI: Pt is a 85 y.o. female with medical history significant of HTN; COPD; dysphagia; scoliosis; and advanced dementia presenting after a mechanical fall. Xray revealed R hip fx. She underwent hemiarthroplasty 03/28/19. Per MD order, swallow evaluation was ordered as pt was only taking pills crushed in puree, has a h/o dementia, and is edentulous. Per RN, she is reportedly on a mechanical soft diet at baseline. Type of Study: Bedside Swallow Evaluation Previous Swallow Assessment: none in chart Diet Prior to this Study: Dysphagia 3 (soft);Thin liquids Temperature Spikes Noted: Yes(100.7) Respiratory Status: Room air History of Recent Intubation: No Behavior/Cognition: Alert;Requires cueing Oral Cavity Assessment: Dry Oral Care Completed by SLP: No Oral Cavity - Dentition: Edentulous Vision: (keeps eyes closed)  Self-Feeding Abilities: Total assist Patient Positioning: Upright in  bed Baseline Vocal Quality: Normal(although speech difficult to understand, mumbled) Volitional Swallow: Unable to elicit    Oral/Motor/Sensory Function Overall Oral Motor/Sensory Function: (difficulty following commands; appears functional)   Ice Chips Ice chips: Not tested   Thin Liquid Thin Liquid: Impaired Presentation: Straw Oral Phase Impairments: Poor awareness of bolus    Nectar Thick Nectar Thick Liquid: Not tested   Honey Thick Honey Thick Liquid: Not tested   Puree Puree: Impaired Presentation: Spoon Oral Phase Impairments: Poor awareness of bolus Oral Phase Functional Implications: Oral holding   Solid     Solid: Impaired Oral Phase Impairments: Poor awareness of bolus Oral Phase Functional Implications: Oral residue      Roberta Griffin 03/30/2019,10:24 AM   Pollyann Glen, M.A. [place] Acute Environmental education officer [phone] Office [phone]

## 2019-03-30 NOTE — Plan of Care (Signed)
  Problem: Clinical Measurements: Goal: Respiratory complications will improve Outcome: Progressing   Problem: Health Behavior/Discharge Planning: Goal: Ability to manage health-related needs will improve Outcome: Not Progressing   Problem: Activity: Goal: Risk for activity intolerance will decrease Outcome: Not Progressing   Problem: Elimination: Goal: Will not experience complications related to urinary retention Outcome: Not Applicable

## 2019-03-30 NOTE — Progress Notes (Signed)
Progress Note   Laurena BeringDorothy Pagliuca ZHY:[id number] DOB: 11/02/1925 DOA: 03/28/2019  PCP: Sherron Mondayejan-Sie, S Ahmed, MD Consultants:  Whitfield - orthopedics Patient coming from: [facility]; NOK: Daughter, Abbott PaoSheila Anderson, [phone]  Chief Complaint: Fall  HPI: Laurena BeringDorothy Traweek is a 85 y.o. female with medical history significant of HTN; COPD; and dementia presenting after a mechanical fall.   She has advanced dementia and is unable to provide history. HPI per PA Sanders: "[facility], staff is unsure exactly how she fell. She is generally bedbound and nonambulatory, bed is in the lowest position with pillow surrounding her. She was found on the floor on her right side. She had an x-ray at facility that showed a hip fracture, family and staff elected to send her here for orthopedic evaluation. Patient is on Eliquis. They are unsure regarding any head injury or loss of consciousness. Patient has advanced dementia, A&O x1 at baseline. She had a left hip fracture last year, ORIF by Dr. Cleophas DunkerWhitfield with [facility]. Patient is DNR." In ED imaging noted R hip fracture.  Dr. Lajoyce Cornersuda requests NPO, hold Eliquis, ortho to see her.  Subjective: Difficult to obtain secondary to patient's baseline dementia, appears well, declines any pain, shortness of breath.  Assessment/Plan Principal Problem:   Closed hip fracture, right, initial encounter [facility](HCC) Active Problems:   COPD (chronic obstructive pulmonary disease) (HCC)   Alzheimer's dementia without behavioral disturbance (HCC)   Essential hypertension   CKD (chronic kidney disease) stage 3, GFR 30-59 ml/min (HCC)   HLD (hyperlipidemia)   Chronic diastolic CHF (congestive heart failure) (HCC)   Fall   History of pulmonary embolus (PE)   Hip fracture (HCC)   Acute fall with subsequent closed R hip fracture, POA, s/p fixation 03/28/19 -Orthopedics following, appreciate insight and recommendations -Resume diet, advance as tolerated -SCDs for  now, defer to ortho timing to resume Eliquis post-operatively -Pain control with Robxain, Vicodin, and Morphine prn -appears well controlled currently -SW consult for rehab placement -Speech following recommending dysphagia 3 thin liquids -Follow PT, patient was generally non-ambulatory prior to fall - wheelchair bound mostly but does not follow commands and gets up without warning - PT to hopefully increase strength and reduce fall risk as patient will likely attempt to ambulate despite our recommendations -Hip fracture order set utilized  Acute hypoxic respiratory failure in the setting of surgery as above, resolved  -Patient on supplemental oxygen overnight postoperatively, likely in the setting of anesthesia and advanced age, CKD -Patient now on room air, will follow oxygen walk screen if able in the next 24 to 48 hours prior to discharge to ensure no ongoing exertional hypoxia  Dementia, at baseline -Patient with apparent behavioral disturbance, based on medication list -Will continue home meds, including Buspar, Citalopram, Melatonin, Remeron, and Risperdal  Anemia, acute blood loss on chronic anemia of chronic disease given CKD below  -Continue to follow with morning labs, no overt signs or symptoms of bleeding at this point -For to Ortho when to resume Eliquis  -Follow closely given drop from 13-8 perioperatively  CKD 3, at baseline -Appears to be stable -Recheck BMP tomorrow AM  H/o PE, POA, stable -Continue Eliquis when appropriate post-operatively -If no surgery today, she would likely benefit from a Heparin bridge  COPD, no acute exacerbation -She does not appear to be taking medications for this and appears to be compensated at this time  HTN, essential -Continue Lopressor but decrease dose to 12.5 BID - ideally, the medication may need to  be held due to bradycardia, but since there is evidence of perioperative benefit with having and detriment with holding, will continue  at decreased dose for now  HLD -No longer taking medications for this issue  Chronic diastolic CHF, no in acute exacerbation; EF 60-65% (02/14/17) -Preserved EF and grade 1 diastolic dysfunction -Appears to be compensated at this time -Continue to follow I/Os  DVT prophylaxis:  SCDs until cleared for Eliquis by orthopedics Code Status:  DNR - confirmed with bedside paperwork Family Communication: Daughter [name] updated over the phone Disposition Plan:  Pending clinical improvement, pain control; PT/OT and surgical clearance Consults called: Orthopedics; SW, Nutrition; will need PT post-operatively  Admission status: Patient continues to require post-op care, IV pain control, and close monitoring peri-operatively. Suspect ultimate disposition back to [facility] in 24-48h pending clinical course and PT/OT recs as well as post op pain control.   Prior to Admission medications   Medication Sig Start Date End Date Taking? Authorizing Provider  acetaminophen (TYLENOL) 325 MG tablet Take 650 mg by mouth every 8 (eight) hours.   Yes [provider]  acetaminophen (TYLENOL) 500 MG tablet Take 500 mg by mouth every 8 (eight) hours as needed for mild pain.  02/19/18  Yes [provider]  busPIRone (BUSPAR) 15 MG tablet Take 5 mg by mouth daily.  02/06/18  Yes [provider]  citalopram (CELEXA) 10 MG tablet Take 10 mg by mouth daily.   Yes [provider]  docusate sodium (COLACE) 100 MG capsule Take 1 capsule (100 mg total) by mouth 2 (two) times daily. 07/31/18  Yes Shelly Coss, MD  Melatonin 3 MG TABS Take 3 mg by mouth at bedtime.  05/18/18  Yes [provider]  metoprolol tartrate (LOPRESSOR) 25 MG tablet Take 25 mg by mouth 2 (two) times daily.   Yes [provider]  mirtazapine (REMERON) 7.5 MG tablet Take 7.5 mg by mouth at bedtime. 10/09/18  Yes [provider]  omeprazole (PRILOSEC) 20 MG capsule Take 20 mg by mouth  daily.   Yes [provider]  ondansetron (ZOFRAN) 4 MG tablet Take 4 mg by mouth every 4 (four) hours as needed for nausea or vomiting.   Yes [provider]  polyethylene glycol (MIRALAX) packet Take 17 g by mouth daily as needed for moderate constipation. Patient taking differently: Take 17 g by mouth See admin instructions. Daily and as needed for constipation 07/31/18  Yes Adhikari, Tamsen Meek, MD  risperiDONE (RISPERDAL) 0.25 MG tablet Take 0.25 mg by mouth at bedtime.    Yes [provider]  senna (SENOKOT) 8.6 MG TABS tablet Take 2 tablets by mouth at bedtime.   Yes [provider]  sucralfate (CARAFATE) 1 g tablet Take 1 g by mouth 4 (four) times daily -  with meals and at bedtime.   Yes [provider]  apixaban (ELIQUIS) 2.5 MG TABS tablet Take 2.5 mg by mouth 2 (two) times daily.    [provider]  HYDROcodone-acetaminophen (NORCO/VICODIN) 5-325 MG tablet Take 1 tablet by mouth every 6 (six) hours as needed for severe pain. Patient not taking: Reported on 03/28/2019 07/31/18   Shelly Coss, MD  metoprolol succinate (TOPROL-XL) 50 MG 24 hr tablet Take 1 tablet (50 mg total) by mouth daily. Take with or immediately following a meal. Patient not taking: Reported on 03/28/2019 02/09/16   Merrily Pew, MD    Physical Exam: Vitals:   03/29/19 1941 03/30/19 0000 03/30/19 2536 03/30/19 6440  BP: (!) 110/58 104/82 123/60 (!) 138/46  Pulse: 78 (!) 32 64 63  Resp: 16 18 17 15   Temp: (!) 100.7 F (38.2 C) 98.2 F (36.8 C) (!) 97.3 F (36.3 C) (!) 97.1 F (36.2 C)  TempSrc: Axillary Axillary Axillary Axillary  SpO2: 99% 100% 96% 100%  Weight:        . General:  Appears calm and comfortable and is NAD . Eyes:   EOMI, normal lids, iris . ENT:  grossly normal lips & tongue, mmm; edentulous . Neck:  no LAD, masses or thyromegaly . Cardiovascular:  RRR, no m/r/g. No LE edema.  Marland Kitchen. Respiratory:   CTA bilaterally with no  wheezes/rales/rhonchi.  Normal respiratory effort. . Abdomen:  soft, NT, ND, NABS . Skin:  no rash or induration seen on limited exam . Musculoskeletal:  atrophic BUE/BLE, RLE mildly shortened . Lower extremity:  No LE edema.  Limited foot exam with no ulcerations.  2+ distal pulses. R hip bandage clean/dry/intact. Marland Kitchen. Psychiatric: alert, pleasant, able to answer yes/no to a few questions but not able to have meaningful conversation  Radiological Exams on Admission: No results found.  Data Reviewed: I have personally reviewed following labs and imaging studies  CBC: Recent Labs  Lab 03/28/19 0521 03/29/19 0310  WBC 9.6 7.9  NEUTROABS 7.6  --   HGB 13.4 8.8*  HCT 42.4 27.1*  MCV 90.6 90.6  PLT 213 185   Basic Metabolic Panel: Recent Labs  Lab 03/28/19 0521 03/29/19 0310  NA 142 143  K 4.2 4.3  CL 108 111  CO2 24 22  GLUCOSE 127* 115*  BUN 25* 24*  CREATININE 1.31* 1.24*  CALCIUM 9.7 8.6*    Recent Labs  Lab 03/28/19 0622  INR 1.1    Recent Results (from the past 240 hour(s))  SARS Coronavirus 2 [facility](Hospital order, Performed in [facility] hospital lab) Nasopharyngeal Nasopharyngeal Swab     Status: None   Collection Time: 03/28/19  5:20 AM   Specimen: Nasopharyngeal Swab  Result Value Ref Range Status   SARS Coronavirus 2 NEGATIVE NEGATIVE Final    Comment: (NOTE) If result is NEGATIVE SARS-CoV-2 target nucleic acids are NOT DETECTED. The SARS-CoV-2 RNA is generally detectable in upper and lower  respiratory specimens during the acute phase of infection. The lowest  concentration of SARS-CoV-2 viral copies this assay can detect is 250  copies / mL. A negative result does not preclude SARS-CoV-2 infection  and should not be used as the sole basis for treatment or other  patient management decisions.  A negative result may occur with  improper specimen collection / handling, submission of specimen other  than nasopharyngeal swab, presence of viral mutation(s)  within the  areas targeted by this assay, and inadequate number of viral copies  (<250 copies / mL). A negative result must be combined with clinical  observations, patient history, and epidemiological information. If result is POSITIVE SARS-CoV-2 target nucleic acids are DETECTED. The SARS-CoV-2 RNA is generally detectable in upper and lower  respiratory specimens dur ing the acute phase of infection.  Positive  results are indicative of active infection with SARS-CoV-2.  Clinical  correlation with patient history and other diagnostic information is  necessary to determine patient infection status.  Positive results do  not rule out bacterial infection or co-infection with other viruses. If result is PRESUMPTIVE POSTIVE SARS-CoV-2 nucleic acids MAY BE PRESENT.   A presumptive positive result was obtained on the submitted specimen  and confirmed  on repeat testing.  While 2019 novel coronavirus  (SARS-CoV-2) nucleic acids may be present in the submitted sample  additional confirmatory testing may be necessary for epidemiological  and / or clinical management purposes  to differentiate between  SARS-CoV-2 and other Sarbecovirus currently known to infect humans.  If clinically indicated additional testing with an alternate test  methodology [phone]([id number]) is advised. The SARS-CoV-2 RNA is generally  detectable in upper and lower respiratory sp ecimens during the acute  phase of infection. The expected result is Negative. Fact Sheet for Patients:  BoilerBrush.com.cyhttps://www.fda.gov/media/136312/download Fact Sheet for Healthcare Providers: https://pope.com/https://www.fda.gov/media/136313/download This test is not yet approved or cleared by the [place] FDA and has been authorized for detection and/or diagnosis of SARS-CoV-2 by FDA under an Emergency Use Authorization (EUA).  This EUA will remain in effect (meaning this test can be used) for the duration of the COVID-19 declaration under Section 564(b)(1) of the Act,  21 U.S.C. section 360bbb-3(b)(1), unless the authorization is terminated or revoked sooner. Performed at [facility], [address] [place], [place] [address]          Radiology Studies: No results found. Scheduled Meds: . aspirin EC  325 mg Oral Q breakfast  . busPIRone  5 mg Oral Daily  . citalopram  10 mg Oral Daily  . docusate sodium  100 mg Oral BID  . Melatonin  3 mg Oral QHS  . metoprolol tartrate  12.5 mg Oral BID  . mirtazapine  7.5 mg Oral QHS  . pantoprazole  40 mg Oral Daily  . polyethylene glycol  17 g Oral Daily  . risperiDONE  0.25 mg Oral QHS  . senna  2 tablet Oral QHS  . sucralfate  1 g Oral TID WC & HS   Continuous Infusions: . sodium chloride 50 mL/hr at 03/29/19 2127  . lactated ringers 10 mL/hr at 03/28/19 1806  . methocarbamol (ROBAXIN) IV      LOS: 2 days   Time spent: 35min  Azucena FallenWilliam C Lancaster, DO [facility]  If 7PM-7AM, please contact night-coverage [email or web address] Password [email or web address] 03/30/2019, 12:21 PM

## 2019-03-31 DIAGNOSIS — E43 Unspecified severe protein-calorie malnutrition: Secondary | ICD-10-CM | POA: Insufficient documentation

## 2019-03-31 LAB — SARS CORONAVIRUS 2 (TAT 6-24 HRS): SARS Coronavirus 2: NEGATIVE

## 2019-03-31 LAB — CBC
HCT: 25.3 % — ABNORMAL LOW (ref 36.0–46.0)
Hemoglobin: 8.1 g/dL — ABNORMAL LOW (ref 12.0–15.0)
MCH: 29.3 pg (ref 26.0–34.0)
MCHC: 32 g/dL (ref 30.0–36.0)
MCV: 91.7 fL (ref 80.0–100.0)
Platelets: 168 10*3/uL (ref 150–400)
RBC: 2.76 MIL/uL — ABNORMAL LOW (ref 3.87–5.11)
RDW: 17.2 % — ABNORMAL HIGH (ref 11.5–15.5)
WBC: 9 10*3/uL (ref 4.0–10.5)

## 2019-03-31 NOTE — Progress Notes (Signed)
  Speech Language Pathology Treatment: Dysphagia  Patient Details Name: Roberta Griffin MRN: [id number] DOB: 12-Jun-1926 Today's Date: 03/31/2019 Time: 2725-3664 SLP Time Calculation (min) (ACUTE ONLY): 14 min  Assessment / Plan / Recommendation Clinical Impression  Pt required max assist with all aspects of po conusmption due to her significant dementia. Exhibiting primitive oral movements at rest. A Dys 2 type texture was manipulated and transited with ease however she held solid graham cracker in mouth and removed by therapist. Discussion with RN and tech revealed difficulty masticating Dys 3 texture recommended yesterday and only able to consume eggs from breakfast tray this morning. Possible airway intrusion following 4-5 consecutive sips water via straw mitigated by removing straw from oral cavity after 1-2 sips. Recommend texture downgrade to Dys 1 will maximize opportunity for oral manipulation and nutritional intake. Continue thin liquids, small sips via cup or straw, crush meds and continue full assist. This texture is appropriate for remainder of acute stay and likely longer term. She no longer requires skilled ST intervention and sign off at this time.    HPI HPI: Pt is a 85 y.o. female with medical history significant of HTN; COPD; dysphagia; scoliosis; and advanced dementia presenting after a mechanical fall. Xray revealed R hip fx. She underwent hemiarthroplasty 03/28/19. Per MD order, swallow evaluation was ordered as pt was only taking pills crushed in puree, has a h/o dementia, and is edentulous. Per RN, she is reportedly on a mechanical soft diet at baseline.      SLP Plan  All goals met       Recommendations  Diet recommendations: Dysphagia 1 (puree);Thin liquid Liquids provided via: Cup;Straw Medication Administration: Crushed with puree Supervision: Full supervision/cueing for compensatory strategies;Staff to assist with self feeding Compensations: Minimize environmental  distractions;Slow rate;Small sips/bites;Follow solids with liquid Postural Changes and/or Swallow Maneuvers: Seated upright 90 degrees                Oral Care Recommendations: Oral care BID Follow up Recommendations: 24 hour supervision/assistance SLP Visit Diagnosis: Dysphagia, oral phase (R13.11) Plan: All goals met       GO                Houston Siren 03/31/2019, 9:43 AM   Orbie Pyo Colvin Caroli.Ed Risk analyst [phone] Office [phone]

## 2019-03-31 NOTE — Progress Notes (Signed)
Progress Note   Roberta BeringDorothy Griffin ZOX:[id number] DOB: 08/19/1925 DOA: 03/28/2019  PCP: Sherron Mondayejan-Sie, S Ahmed, MD Consultants:  [place] - orthopedics Patient coming from: [facility]; NOK: Daughter, Abbott PaoSheila Anderson, [phone]  Chief Complaint: Fall  HPI: Roberta Griffin is a 85 y.o. female with medical history significant of HTN; COPD; and dementia presenting after a mechanical fall.   She has advanced dementia and is unable to provide history. HPI per PA Sanders: "[facility] is [facility], staff is unsure exactly how she fell. She is generally bedbound and nonambulatory, bed is in the lowest position with pillow surrounding her. She was found on the floor on her right side. She had an x-ray at facility that showed a hip fracture, family and staff elected to send her here for orthopedic evaluation. Patient is on Eliquis. They are unsure regarding any head injury or loss of consciousness. Patient has advanced dementia, A&O x1 at baseline. She had a left hip fracture last year, ORIF by Dr. Cleophas DunkerWhitfield with [facility]. Patient is DNR." In ED imaging noted R hip fracture.  Dr. Lajoyce Cornersuda requests NPO, hold Eliquis, ortho to see her.  Subjective: Difficult to obtain secondary to patient's baseline dementia, appears well, declines any pain, shortness of breath.  Assessment/Plan Principal Problem:   Closed hip fracture, right, initial encounter [facility](HCC) Active Problems:   COPD (chronic obstructive pulmonary disease) (HCC)   Alzheimer's dementia without behavioral disturbance (HCC)   Essential hypertension   CKD (chronic kidney disease) stage 3, GFR 30-59 ml/min (HCC)   HLD (hyperlipidemia)   Chronic diastolic CHF (congestive heart failure) (HCC)   Fall   History of pulmonary embolus (PE)   Hip fracture (HCC)   Protein-calorie malnutrition, severe   Acute fall with subsequent closed R hip fracture, POA, s/p fixation 03/28/19 -Orthopedics following, s/p ORIF 03/28/19 -SCDs for now, defer to ortho  timing to resume Eliquis post-operatively -Pain control with Robxain, Vicodin- moderately well controlled -SW consult for rehab placement - needs repeat covid swab(collected 18th) -Speech following recommending dysphagia 3 w/ thin liquids -Follow PT, patient was generally non-ambulatory prior to fall - wheelchair bound mostly but does not follow commands and gets up without warning - PT to hopefully increase strength and reduce fall risk as patient will likely attempt to ambulate despite our recommendations  Acute hypoxic respiratory failure in the setting of surgery as above, resolved  -Patient on supplemental oxygen overnight postoperatively,but now on RA -Non ambulatory as above - no need for ambulatory O2 screen  Dementia, at baseline -Patient with apparent behavioral disturbance, based on medication list -Will continue home meds, including Buspar, Citalopram, Melatonin, Remeron, and Risperdal  Anemia, acute blood loss on chronic anemia of chronic disease given CKD below  -Continue to follow with morning labs, no overt signs or symptoms of bleeding at this point -For to Ortho when to resume Eliquis  -Follow closely given drop from 13-8 perioperatively  Severe protein caloric malnutrition -Nutrition following  CKD 3, at baseline -Appears to be stable -Recheck BMP tomorrow AM  H/o PE, POA, stable -Continue Eliquis when appropriate post-operatively -If no surgery today, she would likely benefit from a Heparin bridge  COPD, no acute exacerbation -She does not appear to be taking medications for this and appears to be compensated at this time  HTN, essential -Continue Lopressor but decrease dose to 12.5 BID - ideally, the medication may need to be held due to bradycardia, but since there is evidence of perioperative benefit with having and detriment with holding, will  continue at decreased dose for now  HLD -No longer taking medications for this issue  Chronic diastolic CHF, no  in acute exacerbation; EF 60-65% (02/14/17) -Preserved EF and grade 1 diastolic dysfunction -Appears to be compensated at this time -Continue to follow I/Os  DVT prophylaxis:  SCDs until cleared for Eliquis by orthopedics Code Status:  DNR - confirmed with bedside paperwork Family Communication: Daughter Roberta Griffin updated over the phone Disposition Plan:  Pending clinical improvement, pain control; PT/OT and repeat covid swab per facility request to return. Consults called: Orthopedics; SW, Nutrition; PT Admission status: Patient continues to require post-op care, PT/OT recs as well as post op pain control. Likely disposition to SNF once approved and has repeat negative covid swab.  Prior to Admission medications   Medication Sig Start Date End Date Taking? Authorizing Provider  acetaminophen (TYLENOL) 325 MG tablet Take 650 mg by mouth every 8 (eight) hours.   Yes [provider]  acetaminophen (TYLENOL) 500 MG tablet Take 500 mg by mouth every 8 (eight) hours as needed for mild pain.  02/19/18  Yes [provider]  busPIRone (BUSPAR) 15 MG tablet Take 5 mg by mouth daily.  02/06/18  Yes [provider]  citalopram (CELEXA) 10 MG tablet Take 10 mg by mouth daily.   Yes [provider]  docusate sodium (COLACE) 100 MG capsule Take 1 capsule (100 mg total) by mouth 2 (two) times daily. 07/31/18  Yes Shelly Coss, MD  Melatonin 3 MG TABS Take 3 mg by mouth at bedtime.  05/18/18  Yes [provider]  metoprolol tartrate (LOPRESSOR) 25 MG tablet Take 25 mg by mouth 2 (two) times daily.   Yes [provider]  mirtazapine (REMERON) 7.5 MG tablet Take 7.5 mg by mouth at bedtime. 10/09/18  Yes [provider]  omeprazole (PRILOSEC) 20 MG capsule Take 20 mg by mouth daily.   Yes [provider]  ondansetron (ZOFRAN) 4 MG tablet Take 4 mg by mouth every 4 (four) hours as needed for nausea or vomiting.   Yes [provider]   polyethylene glycol (MIRALAX) packet Take 17 g by mouth daily as needed for moderate constipation. Patient taking differently: Take 17 g by mouth See admin instructions. Daily and as needed for constipation 07/31/18  Yes Adhikari, Tamsen Meek, MD  risperiDONE (RISPERDAL) 0.25 MG tablet Take 0.25 mg by mouth at bedtime.    Yes [provider]  senna (SENOKOT) 8.6 MG TABS tablet Take 2 tablets by mouth at bedtime.   Yes [provider]  sucralfate (CARAFATE) 1 g tablet Take 1 g by mouth 4 (four) times daily -  with meals and at bedtime.   Yes [provider]  apixaban (ELIQUIS) 2.5 MG TABS tablet Take 2.5 mg by mouth 2 (two) times daily.    [provider]  HYDROcodone-acetaminophen (NORCO/VICODIN) 5-325 MG tablet Take 1 tablet by mouth every 6 (six) hours as needed for severe pain. Patient not taking: Reported on 03/28/2019 07/31/18   Shelly Coss, MD  metoprolol succinate (TOPROL-XL) 50 MG 24 hr tablet Take 1 tablet (50 mg total) by mouth daily. Take with or immediately following a meal. Patient not taking: Reported on 03/28/2019 02/09/16   Merrily Pew, MD    Physical Exam: Vitals:   03/30/19 2126 03/31/19 0416 03/31/19 0800 03/31/19 0856  BP: (!) 110/54 (!) 81/64 (!) 97/47 114/86  Pulse:  65 (!) 53 63  Resp: 14 20    Temp:  97.6 F (36.4  C) 98 F (36.7 C)   TempSrc:  Oral Oral   SpO2:  100% 98%   Weight:        . General:  Appears calm and comfortable and is NAD . Eyes:   EOMI, normal lids, iris . ENT:  grossly normal lips & tongue, mmm; edentulous . Neck:  no LAD, masses or thyromegaly . Cardiovascular:  RRR, no m/r/g. No LE edema.  Marland Kitchen. Respiratory:   CTA bilaterally with no wheezes/rales/rhonchi.  Normal respiratory effort. . Abdomen:  soft, NT, ND, NABS . Skin:  no rash or induration seen on limited exam . Musculoskeletal:  atrophic BUE/BLE, RLE mildly shortened . Lower extremity:  No LE edema.  Limited foot exam with no ulcerations.  2+ distal  pulses. R hip bandage clean/dry/intact. Marland Kitchen. Psychiatric: alert, pleasant, able to answer yes/no and comment on general surroundings - unable to assess orientation; follows commands poorly  Data Reviewed: I have personally reviewed following labs and imaging studies  CBC: Recent Labs  Lab 03/28/19 0521 03/29/19 0310 03/30/19 1204 03/31/19 0308  WBC 9.6 7.9 8.1 9.0  NEUTROABS 7.6  --   --   --   HGB 13.4 8.8* 8.6* 8.1*  HCT 42.4 27.1* 27.0* 25.3*  MCV 90.6 90.6 93.4 91.7  PLT 213 185 197 168   Basic Metabolic Panel: Recent Labs  Lab 03/28/19 0521 03/29/19 0310 03/30/19 1204  NA 142 143 143  K 4.2 4.3 3.6  CL 108 111 112*  CO2 24 22 23   GLUCOSE 127* 115* 104*  BUN 25* 24* 20  CREATININE 1.31* 1.24* 1.21*  CALCIUM 9.7 8.6* 8.5*    Recent Labs  Lab 03/28/19 0622  INR 1.1    Recent Results (from the past 240 hour(s))  SARS Coronavirus 2 [facility](Hospital order, Performed in [facility] hospital lab) Nasopharyngeal Nasopharyngeal Swab     Status: None   Collection Time: 03/28/19  5:20 AM   Specimen: Nasopharyngeal Swab  Result Value Ref Range Status   SARS Coronavirus 2 NEGATIVE NEGATIVE Final    Comment: (NOTE) If result is NEGATIVE SARS-CoV-2 target nucleic acids are NOT DETECTED. The SARS-CoV-2 RNA is generally detectable in upper and lower  respiratory specimens during the acute phase of infection. The lowest  concentration of SARS-CoV-2 viral copies this assay can detect is 250  copies / mL. A negative result does not preclude SARS-CoV-2 infection  and should not be used as the sole basis for treatment or other  patient management decisions.  A negative result may occur with  improper specimen collection / handling, submission of specimen other  than nasopharyngeal swab, presence of viral mutation(s) within the  areas targeted by this assay, and inadequate number of viral copies  (<250 copies / mL). A negative result must be combined with clinical  observations,  patient history, and epidemiological information. If result is POSITIVE SARS-CoV-2 target nucleic acids are DETECTED. The SARS-CoV-2 RNA is generally detectable in upper and lower  respiratory specimens dur ing the acute phase of infection.  Positive  results are indicative of active infection with SARS-CoV-2.  Clinical  correlation with patient history and other diagnostic information is  necessary to determine patient infection status.  Positive results do  not rule out bacterial infection or co-infection with other viruses. If result is PRESUMPTIVE POSTIVE SARS-CoV-2 nucleic acids MAY BE PRESENT.   A presumptive positive result was obtained on the submitted specimen  and confirmed on repeat testing.  While 2019 novel coronavirus  (SARS-CoV-2) nucleic  acids may be present in the submitted sample  additional confirmatory testing may be necessary for epidemiological  and / or clinical management purposes  to differentiate between  SARS-CoV-2 and other Sarbecovirus currently known to infect humans.  If clinically indicated additional testing with an alternate test  methodology [id number]) is advised. The SARS-CoV-2 RNA is generally  detectable in upper and lower respiratory sp ecimens during the acute  phase of infection. The expected result is Negative. Fact Sheet for Patients:  BoilerBrush.com.cyhttps://www.fda.gov/media/136312/download Fact Sheet for Healthcare Providers: https://pope.com/https://www.fda.gov/media/136313/download This test is not yet approved or cleared by the [place] FDA and has been authorized for detection and/or diagnosis of SARS-CoV-2 by FDA under an Emergency Use Authorization (EUA).  This EUA will remain in effect (meaning this test can be used) for the duration of the COVID-19 declaration under Section 564(b)(1) of the Act, 21 U.S.C. section 360bbb-3(b)(1), unless the authorization is terminated or revoked sooner. Performed at [facility], [address] [place], [place]  [id number]      Radiology Studies: No results found. Scheduled Meds: . aspirin EC  325 mg Oral Q breakfast  . busPIRone  5 mg Oral Daily  . citalopram  10 mg Oral Daily  . docusate sodium  100 mg Oral BID  . feeding supplement (ENSURE ENLIVE)  237 mL Oral BID BM  . Melatonin  3 mg Oral QHS  . metoprolol tartrate  12.5 mg Oral BID  . mirtazapine  7.5 mg Oral QHS  . pantoprazole  40 mg Oral Daily  . polyethylene glycol  17 g Oral Daily  . risperiDONE  0.25 mg Oral QHS  . senna  2 tablet Oral QHS  . sucralfate  1 g Oral TID WC & HS   Continuous Infusions: . lactated ringers 10 mL/hr at 03/28/19 1806  . methocarbamol (ROBAXIN) IV      LOS: 3 days   Time spent: 35min  Azucena FallenWilliam C Lancaster, DO [facility]  If 7PM-7AM, please contact night-coverage [email or web address] Password [email or web address] 03/31/2019, 10:32 AM

## 2019-03-31 NOTE — Care Management Important Message (Signed)
Important Message  Patient Details  Name: Roberta Griffin MRN: [id number] Date of Birth: 05-10-1926   Medicare Important Message Given:  Yes     Shelda Altes 03/31/2019, 12:39 PM

## 2019-04-01 DIAGNOSIS — G309 Alzheimer's disease, unspecified: Secondary | ICD-10-CM

## 2019-04-01 DIAGNOSIS — Z86711 Personal history of pulmonary embolism: Secondary | ICD-10-CM

## 2019-04-01 DIAGNOSIS — F0280 Dementia in other diseases classified elsewhere without behavioral disturbance: Secondary | ICD-10-CM

## 2019-04-01 DIAGNOSIS — I5032 Chronic diastolic (congestive) heart failure: Secondary | ICD-10-CM

## 2019-04-01 DIAGNOSIS — N183 Chronic kidney disease, stage 3 (moderate): Secondary | ICD-10-CM

## 2019-04-01 DIAGNOSIS — I10 Essential (primary) hypertension: Secondary | ICD-10-CM

## 2019-04-01 DIAGNOSIS — J438 Other emphysema: Secondary | ICD-10-CM

## 2019-04-01 LAB — CBC
HCT: 26 % — ABNORMAL LOW (ref 36.0–46.0)
Hemoglobin: 8.4 g/dL — ABNORMAL LOW (ref 12.0–15.0)
MCH: 29.7 pg (ref 26.0–34.0)
MCHC: 32.3 g/dL (ref 30.0–36.0)
MCV: 91.9 fL (ref 80.0–100.0)
Platelets: 209 10*3/uL (ref 150–400)
RBC: 2.83 MIL/uL — ABNORMAL LOW (ref 3.87–5.11)
RDW: 16.7 % — ABNORMAL HIGH (ref 11.5–15.5)
WBC: 5.8 10*3/uL (ref 4.0–10.5)
nRBC: 0 % (ref 0.0–0.2)

## 2019-04-01 LAB — BASIC METABOLIC PANEL
Anion gap: 7 (ref 5–15)
BUN: 15 mg/dL (ref 8–23)
CO2: 23 mmol/L (ref 22–32)
Calcium: 8.7 mg/dL — ABNORMAL LOW (ref 8.9–10.3)
Chloride: 113 mmol/L — ABNORMAL HIGH (ref 98–111)
Creatinine, Ser: 1.05 mg/dL — ABNORMAL HIGH (ref 0.44–1.00)
GFR calc Af Amer: 54 mL/min — ABNORMAL LOW (ref >60)
GFR calc non Af Amer: 46 mL/min — ABNORMAL LOW (ref >60)
Glucose, Bld: 88 mg/dL (ref 70–99)
Potassium: 3.7 mmol/L (ref 3.5–5.1)
Sodium: 143 mmol/L (ref 135–145)

## 2019-04-01 MED ORDER — METHOCARBAMOL 500 MG PO TABS: 500.0000 mg | ORAL_TABLET | Freq: Four times a day (QID) | ORAL | Status: AC | PRN

## 2019-04-01 MED ORDER — ASPIRIN 81 MG PO CHEW: 81.0000 mg | CHEWABLE_TABLET | Freq: Every day | ORAL | Status: DC

## 2019-04-01 MED ORDER — BISACODYL 5 MG PO TBEC: 5.0000 mg | DELAYED_RELEASE_TABLET | Freq: Every day | ORAL | 0 refills | Status: AC | PRN

## 2019-04-01 MED ORDER — OXYCODONE HCL 5 MG PO TABS: 2.5000 mg | ORAL_TABLET | Freq: Four times a day (QID) | ORAL | 0 refills | Status: AC | PRN

## 2019-04-01 MED ORDER — ASPIRIN 81 MG PO CHEW: 81.0000 mg | CHEWABLE_TABLET | Freq: Every day | ORAL | 1 refills | Status: AC

## 2019-04-01 MED ORDER — METOPROLOL TARTRATE 25 MG PO TABS: 12.5000 mg | ORAL_TABLET | Freq: Two times a day (BID) | ORAL | Status: AC

## 2019-04-01 NOTE — TOC Transition Note (Signed)
Transition of Care [facility]) - CM/SW Discharge Note   Patient Details  Name: Roberta Griffin MRN: [id number] Date of Birth: May 23, 1926  Transition of Care [facility]) CM/SW Contact:  Vinie Sill, [place] Phone Number: 04/01/2019, 2:59 PM   Clinical Narrative:     Patient will DC to: [place]  DC Date: 04/01/2019 Family Notified: [name], daughter  Transport By: Corey Harold @ 4pm  RN, patient, and facility notified of DC. Discharge Summary sent to facility. RN given number for [phone], Room 120A. Ambulance transport requested for patient.   Clinical Social Worker signing off. Thurmond Butts, MSW, [facility] Clinical Social Worker [phone]    Final next level of care: Skilled Nursing Facility Barriers to Discharge: No Barriers Identified   Patient Goals and CMS Choice Patient states their goals for this hospitalization and ongoing recovery are:: family wants pateint to return to  [facility]      Discharge Placement PASRR number recieved: 04/01/19            Patient chooses bed at: [facility]) Patient to be transferred to facility by: [place] Name of family member notified: Shelia,daughter Patient and family notified of of transfer: 04/01/19  Discharge Plan and Services In-house Referral: Clinical Social Work                                   Social Determinants of Health (SDOH) Interventions     Readmission Risk Interventions No flowsheet data found.

## 2019-04-01 NOTE — TOC Initial Note (Signed)
Transition of Care [facility]) - Initial/Assessment Note    Patient Details  Name: Roberta Griffin MRN: [id number] Date of Birth: June 09, 1926  Transition of Care [facility]) CM/SW Contact:    Vinie Sill, [place] Phone Number: 04/01/2019, 11:49 AM  Clinical Narrative:                  CSW spoke with the patient's daughter, [name]. CSW introduced self and explained role. Patient's daughter confirmed the patient is from [place] and requested that she returns at discharged. Patient's daughter inquired about bed alarms at the SNF- CSW explained the SNF does not use bed alarms. She inquired about the patient's medication given at night( she believes  it makes her "too out of it").- CSW advised she express her concerns to the MD.CSW contacted [facility] and they are prepared to accept patient back today. Family states no other questions or concerns at this time.  CSW updated RN- CSW will continue to follow and update once patient discharge summary and order is in.   Thurmond Butts, MSW, [facility] Clinical Social Worker [phone]   Expected Discharge Plan: Skilled Nursing Facility Barriers to Discharge: No Barriers Identified   Patient Goals and CMS Choice Patient states their goals for this hospitalization and ongoing recovery are:: family wants pateint to return to  [facility]      Expected Discharge Plan and Services Expected Discharge Plan: [place] In-house Referral: Clinical Social Work     Living arrangements for the past 2 months: [place]                                      Prior Living Arrangements/Services Living arrangements for the past 2 months: [place] Lives with:: Facility Resident Patient language and need for interpreter reviewed:: Yes Do you feel safe going back to the place where you live?: Yes      Need for Family Participation in Patient Care: Yes (Comment) Care giver support system in place?:  Yes (comment)   Criminal Activity/Legal Involvement Pertinent to Current Situation/Hospitalization: No - Comment as needed  Activities of Daily Living      Permission Sought/Granted Permission sought to share information with : Family Supports Permission granted to share information with : Yes, Verbal Permission Granted  Share Information with NAME: Ulice Dash [phone]  Permission granted to share info w AGENCY: [place] granted to share info w Relationship: daughter  Permission granted to share info w Contact Information: [phone]  Emotional Assessment   Attitude/Demeanor/Rapport: Unable to Assess Affect (typically observed): Unable to Assess Orientation: : Oriented to Self, Oriented to Place, Oriented to  Time, Oriented to Situation Alcohol / Substance Use: Not Applicable Psych Involvement: No (comment)  Admission diagnosis:  Closed fracture of right hip, initial encounter ([place]) [S72.001A] Hip fracture ([place]) [S72.009A] Patient Active Problem List   Diagnosis Date Noted  . Protein-calorie malnutrition, severe 03/31/2019  . History of pulmonary embolus (PE) 03/28/2019  . Hip fracture ([place]) 03/28/2019  . Fall 10/12/2018  . Hypothermia 10/11/2018  . Subcapital fracture of left hip ([place]) 07/29/2018  . Closed hip fracture, right, initial encounter ([place]) 07/26/2018  . Depression with anxiety 07/26/2017  . High risk medication use 06/03/2017  . Chronic pain of both shoulders 06/03/2017  . Schizophrenia ([place]) 04/26/2017  . GAD (generalized anxiety disorder) 04/05/2017  . History of stroke 12/05/2016  .  Pulmonary embolus (HCC) 12/05/2016  . Chronic diastolic CHF (congestive heart failure) (HCC) 03/04/2016  . Insomnia 03/04/2016  . Dysarthria 03/04/2016  . Thyroid nodule 03/04/2016  . Pancreatic mass   . HLD (hyperlipidemia)   . Dysphagia 02/18/2016  . CVA (cerebral vascular accident) (HCC) 02/17/2016  . COPD (chronic obstructive pulmonary  disease) (HCC) 02/17/2016  . Alzheimer's dementia without behavioral disturbance (HCC) 02/17/2016  . Essential hypertension 02/17/2016  . Normocytic anemia 02/17/2016  . CKD (chronic kidney disease) stage 3, GFR 30-59 ml/min (HCC) 02/17/2016   PCP:  Sherron Mondayejan-Sie, S Ahmed, MD Pharmacy:   [facility] [place], [place] - [address] [place] [address] Phone: [phone] Fax: [phone]     Social Determinants of Health (SDOH) Interventions    Readmission Risk Interventions No flowsheet data found.

## 2019-04-01 NOTE — Plan of Care (Signed)
  Problem: Clinical Measurements: Goal: Will remain free from infection Outcome: Progressing   Problem: Health Behavior/Discharge Planning: Goal: Ability to manage health-related needs will improve Outcome: Not Progressing   Problem: Activity: Goal: Risk for activity intolerance will decrease Outcome: Not Progressing   Problem: Nutrition: Goal: Adequate nutrition will be maintained Outcome: Not Progressing   

## 2019-04-01 NOTE — Discharge Summary (Signed)
Roberta Griffin ZOX:[id number] DOB: 11/09/1925 DOA: 03/28/2019  PCP: Sherron Mondayejan-Sie, S Ahmed, MD  Admit date: 03/28/2019 Discharge date: 04/01/2019  [facility]  Recommendations for Outpatient Follow-up:  1. Dr. Lajoyce Cornersuda with orthopedics to arrange postoperative follow-up in 1 week 2. Please obtain CBC (hemoglobin) in one week 3. New medications: Short prescription provided for oxycodone 2.5 mg q6 HRS PRN(15 tabs) for breakthrough pain, and Robaxin for muscle spasms related to fracture, continue Tylenol for moderate pain   Home Health:NO Equipment/Devices: Dry changes to right hip postoperative dressing as needed per orthopedic recommendations, patient does not have the ability to understand gait training and orthopedic consultants recommend not attempting gait training.  Patient is very high fall risk  Discharge Condition: Stable CODE STATUS: DNR Diet recommendation: Heart Healthy   Brief/Interim Summary: History of present illness:  Roberta Griffin is a 85 y.o. year old female with medical history significant for HTN, COPD, dementia who presented on 03/28/2019 with unwitnessed fall.  Patient is generally bedbound and nonambulatory was found on the floor.  X-ray at facility showed hip fracture patient underwent ORIF on 8/15 for right hip fracture. Remaining hospital course addressed in problem based format below:   Hospital Course:    Closed right femoral neck fracture in setting of unwitnessed fall.  Status post fixation on 8/15.  Given dementia and prior history of recurrent falls patient is a very high fall risk and should not undergo gait training as she does not understand this.  Pain control with PRN Tylenol, oxycodocone 2.5 mg q6H PRN for breakthrough pain.  Continue low-dose aspirin for DVT prophylaxis, will not resume home Eliquis.   Acute on chronic blood loss anemia.  Has baseline anemia likely related to CKD with prior baseline  hemoglobin of 10.7-12.5, likely acute worsening setting of blood loss anemia related to operation.  13.4 on admission likely some hemoconcentration, has remained stable from 8.4-8.8.  Recommend CBC check 1 week on discharge,   History of PE (2017).  On Eliquis prior to admission.  Given age and recurrent nature of falls discontinue further use of Eliquis, will continue BB dose aspirin for DVT prophylaxis.    COPD, stable.  No wheezing on exam, stable O2 requirements on discharge.   Stage III CKD, stable   Dementia without behavioral disturbance stable at baseline.  Only alert to self. Continue home BuSpar, Celexa, melatonin, Remeron and Risperdal   Hypertension.  At goal.  Lopressor decreased to 12.5 mg twice daily here.   Chronic diastolic CHF, stable.  Euvolemic on exam.  EF 60-85% (2018).  Consultations:  Orthopedic  Procedures/Studies: 03/28/2019, hemiarthroplasty for right femoral neck fracture Subjective:  Discharge Exam: Vitals:   04/01/19 0838 04/01/19 1155  BP: (!) 125/57 119/67  Pulse:  (!) 53  Resp: 18   Temp: 98 F (36.7 C)   SpO2:  100%   Vitals:   03/31/19 1056 03/31/19 2012 04/01/19 0838 04/01/19 1155  BP: 102/81 137/60 (!) 125/57 119/67  Pulse: 62 68  (!) 53  Resp:  19 18   Temp: (!) 97 F (36.1 C) 97.9 F (36.6 C) 98 F (36.7 C)   TempSrc: Axillary Axillary Oral   SpO2: 98% 95%  100%  Weight:        General: Lying in bed, no apparent distress Eyes: EOMI, anicteric ENT: Oral Mucosa clear and moist MSK: Postoperative dressing in place on right hip Cardiovascular: regular rate and rhythm, no murmurs, rubs or gallops, no edema,  Respiratory: Normal respiratory effort on room air, lungs clear to auscultation bilaterally Abdomen: soft, non-distended, non-tender, normal bowel sounds Skin: No Rash Neurologic: Alert only to self, unable to answer any orientation questions.  Grossly no focal neuro deficit. Discharge Diagnoses:  Principal Problem:    Closed hip fracture, right, initial encounter ([place]) Active Problems:   COPD (chronic obstructive pulmonary disease) (HCC)   Alzheimer's dementia without behavioral disturbance (HCC)   Essential hypertension   CKD (chronic kidney disease) stage 3, GFR 30-59 ml/min (HCC)   HLD (hyperlipidemia)   Chronic diastolic CHF (congestive heart failure) ([place])   Fall   History of pulmonary embolus (PE)   Hip fracture (HCC)   Protein-calorie malnutrition, severe    Discharge Instructions  Discharge Instructions    Diet - low sodium heart healthy   Complete by: As directed    Increase activity slowly   Complete by: As directed    Touch down weight bearing   Complete by: As directed    Recommend against gait training she may be touchdown weightbearing for transfers only.   Laterality: right   Extremity: Lower     Allergies as of 04/01/2019      Reactions   Tramadol Other (See Comments)   twitching      Medication List    STOP taking these medications   Eliquis 2.5 MG Tabs tablet Generic drug: apixaban     TAKE these medications   acetaminophen 500 MG tablet Commonly known as: TYLENOL Take 1 tablet (500 mg total) by mouth every 6 (six) hours as needed for mild pain. What changed: when to take this   aspirin 81 MG chewable tablet Chew 1 tablet (81 mg total) by mouth daily. Start taking on: April 02, 2019   bisacodyl 5 MG EC tablet Commonly known as: DULCOLAX Take 1 tablet (5 mg total) by mouth daily as needed for moderate constipation.   busPIRone 15 MG tablet Commonly known as: BUSPAR Take 5 mg by mouth daily.   Carafate 1 g tablet Generic drug: sucralfate Take 1 g by mouth 4 (four) times daily -  with meals and at bedtime.   citalopram 10 MG tablet Commonly known as: CELEXA Take 10 mg by mouth daily.   docusate sodium 100 MG capsule Commonly known as: COLACE Take 1 capsule (100 mg total) by mouth 2 (two) times daily.   Melatonin 3 MG Tabs Take 3 mg by mouth at  bedtime.   methocarbamol 500 MG tablet Commonly known as: ROBAXIN Take 1 tablet (500 mg total) by mouth every 6 (six) hours as needed for up to 20 days for muscle spasms.   metoprolol tartrate 25 MG tablet Commonly known as: LOPRESSOR Take 0.5 tablets (12.5 mg total) by mouth 2 (two) times daily. What changed: how much to take   mirtazapine 7.5 MG tablet Commonly known as: REMERON Take 7.5 mg by mouth at bedtime.   omeprazole 20 MG capsule Commonly known as: PRILOSEC Take 20 mg by mouth daily.   ondansetron 4 MG tablet Commonly known as: ZOFRAN Take 4 mg by mouth every 4 (four) hours as needed for nausea or vomiting.   oxyCODONE 5 MG immediate release tablet Commonly known as: Roxicodone Take 0.5 tablets (2.5 mg total) by mouth every 6 (six) hours as needed.   polyethylene glycol 17 g packet Commonly known as: MiraLax Take 17 g by mouth daily as needed for moderate constipation. What changed:   when to take this  additional instructions  risperiDONE 0.25 MG tablet Commonly known as: RISPERDAL Take 0.25 mg by mouth at bedtime.   senna 8.6 MG Tabs tablet Commonly known as: SENOKOT Take 2 tablets by mouth at bedtime.            Discharge Care Instructions  (From admission, onward)         Start     Ordered   03/28/19 0000  Touch down weight bearing    Comments: Recommend against gait training she may be touchdown weightbearing for transfers only.  Question Answer Comment  Laterality right   Extremity Lower      03/28/19 1141         Follow-up Information    Nadara Mustard, MD In 1 week.   Specialty: Orthopedic Surgery Contact information: [address] [place] [address] [phone]          Allergies  Allergen Reactions  . Tramadol Other (See Comments)    twitching        The results of significant diagnostics from this hospitalization (including imaging, microbiology, ancillary and laboratory) are listed below for  reference.     Microbiology: Recent Results (from the past 240 hour(s))  SARS Coronavirus 2 [facility] order, Performed in [facility] hospital lab) Nasopharyngeal Nasopharyngeal Swab     Status: None   Collection Time: 03/28/19  5:20 AM   Specimen: Nasopharyngeal Swab  Result Value Ref Range Status   SARS Coronavirus 2 NEGATIVE NEGATIVE Final    Comment: (NOTE) If result is NEGATIVE SARS-CoV-2 target nucleic acids are NOT DETECTED. The SARS-CoV-2 RNA is generally detectable in upper and lower  respiratory specimens during the acute phase of infection. The lowest  concentration of SARS-CoV-2 viral copies this assay can detect is 250  copies / mL. A negative result does not preclude SARS-CoV-2 infection  and should not be used as the sole basis for treatment or other  patient management decisions.  A negative result may occur with  improper specimen collection / handling, submission of specimen other  than nasopharyngeal swab, presence of viral mutation(s) within the  areas targeted by this assay, and inadequate number of viral copies  (<250 copies / mL). A negative result must be combined with clinical  observations, patient history, and epidemiological information. If result is POSITIVE SARS-CoV-2 target nucleic acids are DETECTED. The SARS-CoV-2 RNA is generally detectable in upper and lower  respiratory specimens dur ing the acute phase of infection.  Positive  results are indicative of active infection with SARS-CoV-2.  Clinical  correlation with patient history and other diagnostic information is  necessary to determine patient infection status.  Positive results do  not rule out bacterial infection or co-infection with other viruses. If result is PRESUMPTIVE POSTIVE SARS-CoV-2 nucleic acids MAY BE PRESENT.   A presumptive positive result was obtained on the submitted specimen  and confirmed on repeat testing.  While 2019 novel coronavirus  (SARS-CoV-2) nucleic acids may be  present in the submitted sample  additional confirmatory testing may be necessary for epidemiological  and / or clinical management purposes  to differentiate between  SARS-CoV-2 and other Sarbecovirus currently known to infect humans.  If clinically indicated additional testing with an alternate test  methodology [phone]) is advised. The SARS-CoV-2 RNA is generally  detectable in upper and lower respiratory sp ecimens during the acute  phase of infection. The expected result is Negative. Fact Sheet for Patients:  BoilerBrush.com.cy Fact Sheet for Healthcare Providers: https://pope.com/ This test is not yet  approved or cleared by the [place] and has been authorized for detection and/or diagnosis of SARS-CoV-2 by FDA under an Emergency Use Authorization (EUA).  This EUA will remain in effect (meaning this test can be used) for the duration of the COVID-19 declaration under Section 564(b)(1) of the Act, 21 U.S.C. section 360bbb-3(b)(1), unless the authorization is terminated or revoked sooner. Performed at [facility], [address], [place], [place] [address]   SARS CORONAVIRUS 2 Nasal Swab Aptima Multi Swab     Status: None   Collection Time: 03/31/19 10:29 AM   Specimen: Aptima Multi Swab; Nasal Swab  Result Value Ref Range Status   SARS Coronavirus 2 NEGATIVE NEGATIVE Final    Comment: (NOTE) SARS-CoV-2 target nucleic acids are NOT DETECTED. The SARS-CoV-2 RNA is generally detectable in upper and lower respiratory specimens during the acute phase of infection. Negative results do not preclude SARS-CoV-2 infection, do not rule out co-infections with other pathogens, and should not be used as the sole basis for treatment or other patient management decisions. Negative results must be combined with clinical observations, patient history, and epidemiological information. The expected result is Negative. Fact Sheet  for Patients: HairSlick.no Fact Sheet for Healthcare Providers: quierodirigir.com This test is not yet approved or cleared by the [place] FDA and  has been authorized for detection and/or diagnosis of SARS-CoV-2 by FDA under an Emergency Use Authorization (EUA). This EUA will remain  in effect (meaning this test can be used) for the duration of the COVID-19 declaration under Section 56 4(b)(1) of the Act, 21 U.S.C. section 360bbb-3(b)(1), unless the authorization is terminated or revoked sooner. Performed at [facility], [address], [place], [place] [address]      Labs: BNP (last 3 results) No results for input(s): BNP in the last 8760 hours. Basic Metabolic Panel: Recent Labs  Lab 03/28/19 0521 03/29/19 0310 03/30/19 1204 04/01/19 0232  NA 142 143 143 143  K 4.2 4.3 3.6 3.7  CL 108 111 112* 113*  CO2 24 22 23 23   GLUCOSE 127* 115* 104* 88  BUN 25* 24* 20 15  CREATININE 1.31* 1.24* 1.21* 1.05*  CALCIUM 9.7 8.6* 8.5* 8.7*   Liver Function Tests: No results for input(s): AST, ALT, ALKPHOS, BILITOT, PROT, ALBUMIN in the last 168 hours. No results for input(s): LIPASE, AMYLASE in the last 168 hours. No results for input(s): AMMONIA in the last 168 hours. CBC: Recent Labs  Lab 03/28/19 0521 03/29/19 0310 03/30/19 1204 03/31/19 0308 04/01/19 0232  WBC 9.6 7.9 8.1 9.0 5.8  NEUTROABS 7.6  --   --   --   --   HGB 13.4 8.8* 8.6* 8.1* 8.4*  HCT 42.4 27.1* 27.0* 25.3* 26.0*  MCV 90.6 90.6 93.4 91.7 91.9  PLT 213 185 197 168 209   Cardiac Enzymes: No results for input(s): CKTOTAL, CKMB, CKMBINDEX, TROPONINI in the last 168 hours. BNP: Invalid input(s): POCBNP CBG: No results for input(s): GLUCAP in the last 168 hours. D-Dimer No results for input(s): DDIMER in the last 72 hours. Hgb A1c No results for input(s): HGBA1C in the last 72 hours. Lipid Profile No results for input(s): CHOL, HDL,  LDLCALC, TRIG, CHOLHDL, LDLDIRECT in the last 72 hours. Thyroid function studies No results for input(s): TSH, T4TOTAL, T3FREE, THYROIDAB in the last 72 hours.  Invalid input(s): FREET3 Anemia work up No results for input(s): VITAMINB12, FOLATE, FERRITIN, TIBC, IRON, RETICCTPCT in the last 72 hours. Urinalysis  Component Value Date/Time   COLORURINE YELLOW 10/12/2018 0015   APPEARANCEUR CLEAR 10/12/2018 0015   LABSPEC 1.011 10/12/2018 0015   PHURINE 6.0 10/12/2018 0015   GLUCOSEU NEGATIVE 10/12/2018 0015   HGBUR NEGATIVE 10/12/2018 0015   BILIRUBINUR NEGATIVE 10/12/2018 0015   KETONESUR NEGATIVE 10/12/2018 0015   PROTEINUR NEGATIVE 10/12/2018 0015   NITRITE NEGATIVE 10/12/2018 0015   LEUKOCYTESUR NEGATIVE 10/12/2018 0015   Sepsis Labs Invalid input(s): PROCALCITONIN,  WBC,  LACTICIDVEN Microbiology Recent Results (from the past 240 hour(s))  SARS Coronavirus 2 [facility](H[facility] order, Performed in [facility] hospital lab) Nasopharyngeal Nasopharyngeal Swab     Status: None   Collection Time: 03/28/19  5:20 AM   Specimen: Nasopharyngeal Swab  Result Value Ref Range Status   SARS Coronavirus 2 NEGATIVE NEGATIVE Final    Comment: (NOTE) If result is NEGATIVE SARS-CoV-2 target nucleic acids are NOT DETECTED. The SARS-CoV-2 RNA is generally detectable in upper and lower  respiratory specimens during the acute phase of infection. The lowest  concentration of SARS-CoV-2 viral copies this assay can detect is 250  copies / mL. A negative result does not preclude SARS-CoV-2 infection  and should not be used as the sole basis for treatment or other  patient management decisions.  A negative result may occur with  improper specimen collection / handling, submission of specimen other  than nasopharyngeal swab, presence of viral mutation(s) within the  areas targeted by this assay, and inadequate number of viral copies  (<250 copies / mL). A negative result must be combined with clinical   observations, patient history, and epidemiological information. If result is POSITIVE SARS-CoV-2 target nucleic acids are DETECTED. The SARS-CoV-2 RNA is generally detectable in upper and lower  respiratory specimens dur ing the acute phase of infection.  Positive  results are indicative of active infection with SARS-CoV-2.  Clinical  correlation with patient history and other diagnostic information is  necessary to determine patient infection status.  Positive results do  not rule out bacterial infection or co-infection with other viruses. If result is PRESUMPTIVE POSTIVE SARS-CoV-2 nucleic acids MAY BE PRESENT.   A presumptive positive result was obtained on the submitted specimen  and confirmed on repeat testing.  While 2019 novel coronavirus  (SARS-CoV-2) nucleic acids may be present in the submitted sample  additional confirmatory testing may be necessary for epidemiological  and / or clinical management purposes  to differentiate between  SARS-CoV-2 and other Sarbecovirus currently known to infect humans.  If clinically indicated additional testing with an alternate test  methodology [id number]) is advised. The SARS-CoV-2 RNA is generally  detectable in upper and lower respiratory sp ecimens during the acute  phase of infection. The expected result is Negative. Fact Sheet for Patients:  BoilerBrush.com.cyhttps://www.fda.gov/media/136312/download Fact Sheet for Healthcare Providers: https://pope.com/https://www.fda.gov/media/136313/download This test is not yet approved or cleared by the [place] FDA and has been authorized for detection and/or diagnosis of SARS-CoV-2 by FDA under an Emergency Use Authorization (EUA).  This EUA will remain in effect (meaning this test can be used) for the duration of the COVID-19 declaration under Section 564(b)(1) of the Act, 21 U.S.C. section 360bbb-3(b)(1), unless the authorization is terminated or revoked sooner. Performed at [facility], [address]  [place], [place] [address]   SARS CORONAVIRUS 2 Nasal Swab Aptima Multi Swab     Status: None   Collection Time: 03/31/19 10:29 AM   Specimen: Aptima Multi Swab; Nasal Swab  Result Value Ref Range Status  SARS Coronavirus 2 NEGATIVE NEGATIVE Final    Comment: (NOTE) SARS-CoV-2 target nucleic acids are NOT DETECTED. The SARS-CoV-2 RNA is generally detectable in upper and lower respiratory specimens during the acute phase of infection. Negative results do not preclude SARS-CoV-2 infection, do not rule out co-infections with other pathogens, and should not be used as the sole basis for treatment or other patient management decisions. Negative results must be combined with clinical observations, patient history, and epidemiological information. The expected result is Negative. Fact Sheet for Patients: HairSlick.nohttps://www.fda.gov/media/138098/download Fact Sheet for Healthcare Providers: quierodirigir.comhttps://www.fda.gov/media/138095/download This test is not yet approved or cleared by the [place] FDA and  has been authorized for detection and/or diagnosis of SARS-CoV-2 by FDA under an Emergency Use Authorization (EUA). This EUA will remain  in effect (meaning this test can be used) for the duration of the COVID-19 declaration under Section 56 4(b)(1) of the Act, 21 U.S.C. section 360bbb-3(b)(1), unless the authorization is terminated or revoked sooner. Performed at [facility], [address] [place], [place] [address]      Time coordinating discharge: Over 30 minutes  SIGNED:   Laverna PeaceShayla D Nettey, MD  [facility] 04/01/2019, 2:51 PM Pager   If 7PM-7AM, please contact night-coverage [email or web address] Password [email or web address]

## 2019-04-01 NOTE — Progress Notes (Signed)
Physical Therapy Treatment Patient Details Name: Roberta Griffin MRN: [id number] DOB: 1925-10-04 Today's Date: 04/01/2019    History of Present Illness 85 y.o. female with medical history significant of HTN; COPD; and advanced dementia presenting after a mechanical fall. Xray revealed R hip fx. She underwent hemiarthroplasty 03/28/19.    PT Comments    Pt did not tolerate PROM well. She may do better once she returns to her SNF which she has been at for several years.  Per grand-daughter, as of MArch, pt was getting up to w/c and could propel with her feet.  Grand-daughter educated on hip precautions.   Follow Up Recommendations  SNF     Equipment Recommendations  None recommended by PT    Recommendations for Other Services       Precautions / Restrictions Precautions Precautions: Fall;Posterior Hip Precaution Comments: Reviewed with grand-daughter Restrictions Weight Bearing Restrictions: Yes RLE Weight Bearing: Weight bearing as tolerated    Mobility  Bed Mobility                  Transfers                    Ambulation/Gait                 Stairs             Wheelchair Mobility    Modified Rankin (Stroke Patients Only)       Balance                                            Cognition Arousal/Alertness: Lethargic Behavior During Therapy: Restless;Agitated Overall Cognitive Status: History of cognitive impairments - at baseline                                 General Comments: advanced dementia at baseline      Exercises Total Joint Exercises Ankle Circles/Pumps: PROM;Both;10 reps Heel Slides: PROM;Right;5 reps;Supine Hip ABduction/ADduction: PROM;Right;5 reps Other Exercises Other Exercises: Pt resistant to PROM    General Comments General comments (skin integrity, edema, etc.): Grand-daughter, Roberta Griffin, was present and instructed in hip precautions.  Recommended her following up with  SNF staff to make sure they were aware of precautions.  She verbalized understanding and took a picture of illustrated hip precautions.      Pertinent Vitals/Pain Pain Assessment: Faces Faces Pain Scale: Hurts whole lot Pain Location: RLE with mobility Pain Descriptors / Indicators: Grimacing;Operative site guarding;Restless Pain Intervention(s): Limited activity within patient's tolerance    Home Living                      Prior Function            PT Goals (current goals can now be found in the care plan section) Progress towards PT goals: Not progressing toward goals - comment    Frequency    Min 2X/week      PT Plan Current plan remains appropriate    Co-evaluation              AM-PAC PT "6 Clicks" Mobility   Outcome Measure  Help needed turning from your back to your side while in a flat bed without using bedrails?: Total Help needed moving from lying on your back to sitting on  the side of a flat bed without using bedrails?: Total Help needed moving to and from a bed to a chair (including a wheelchair)?: Total Help needed standing up from a chair using your arms (e.g., wheelchair or bedside chair)?: Total Help needed to walk in hospital room?: Total Help needed climbing 3-5 steps with a railing? : Total 6 Click Score: 6    End of Session   Activity Tolerance: Patient limited by pain;Treatment limited secondary to agitation Patient left: in bed;with call bell/phone within reach;with bed alarm set;with family/visitor present   PT Visit Diagnosis: Other abnormalities of gait and mobility (R26.89);Muscle weakness (generalized) (M62.81);Pain Pain - Right/Left: Right Pain - part of body: Hip     Time: 1202-1214 PT Time Calculation (min) (ACUTE ONLY): 12 min  Charges:  $Therapeutic Exercise: 8-22 mins                     Karen L. Katrinka BlazingSmith, [place] Pager [phone] 04/01/2019    Roberta MontgomeryKaren L Smith 04/01/2019, 12:33 PM

## 2019-04-01 NOTE — Progress Notes (Signed)
Report called and given to Happy. Informed of followup- appointments. Questions answered.  IV removed catheter intact, Family already informed of discharge.  [facility] to transport patient.

## 2019-04-01 NOTE — [place] FL2 (Signed)
[place] MEDICAID FL2 LEVEL OF CARE SCREENING TOOL     IDENTIFICATION  Patient Name: Roberta BeringDorothy Venturino Birthdate: 07/21/1926 Sex: female Admission Date (Current Location): 03/28/2019  [place]  [place] [id number] Facility and Address:  [facility] [employer], [address], [place], [place] [address]      Provider Number: [id number]  Attending Physician Name and Address:  Laverna PeaceNettey, Shayla D, MD  Relative Name and Phone Number:  Abbott PaoSheila Anderson; daughter, [phone]    Current Level of Care: Hospital Recommended Level of Care: Skilled Nursing Facility Prior Approval Number:    Date Approved/Denied:   PASRR Number: [id number]  Discharge Plan: SNF    Current Diagnoses: Patient Active Problem List   Diagnosis Date Noted  . Protein-calorie malnutrition, severe 03/31/2019  . History of pulmonary embolus (PE) 03/28/2019  . Hip fracture (HCC) 03/28/2019  . Fall 10/12/2018  . Hypothermia 10/11/2018  . Subcapital fracture of left hip (HCC) 07/29/2018  . Closed hip fracture, right, initial encounter (HCC) 07/26/2018  . Depression with anxiety 07/26/2017  . High risk medication use 06/03/2017  . Chronic pain of both shoulders 06/03/2017  . Schizophrenia (HCC) 04/26/2017  . GAD (generalized anxiety disorder) 04/05/2017  . History of stroke 12/05/2016  . Pulmonary embolus (HCC) 12/05/2016  . Chronic diastolic CHF (congestive heart failure) (HCC) 03/04/2016  . Insomnia 03/04/2016  . Dysarthria 03/04/2016  . Thyroid nodule 03/04/2016  . Pancreatic mass   . HLD (hyperlipidemia)   . Dysphagia 02/18/2016  . CVA (cerebral vascular accident) (HCC) 02/17/2016  . COPD (chronic obstructive pulmonary disease) (HCC) 02/17/2016  . Alzheimer's dementia without behavioral disturbance (HCC) 02/17/2016  . Essential hypertension 02/17/2016  . Normocytic anemia 02/17/2016  . CKD (chronic kidney disease) stage 3, GFR 30-59 ml/min (HCC) 02/17/2016     Orientation RESPIRATION BLADDER Height & Weight     (disoriented x4)  Normal Incontinent Weight: 108 lb 0.4 oz (49 kg) Height:     BEHAVIORAL SYMPTOMS/MOOD NEUROLOGICAL BOWEL NUTRITION STATUS      Incontinent Diet(Dysphagia 1 (puree);Thin liquid)  AMBULATORY STATUS COMMUNICATION OF NEEDS Skin   Total Care Non-Verbally Surgical wounds, Other (Comment)(closed incision on right hip with gauze; MASD on buttocks)                       Personal Care Assistance Level of Assistance  Total care       Total Care Assistance: Maximum assistance   Functional Limitations Info  Sight, Speech, Hearing Sight Info: Adequate Hearing Info: Adequate Speech Info: Impaired    SPECIAL CARE FACTORS FREQUENCY                       Contractures Contractures Info: Not present    Additional Factors Info  Code Status, Allergies, Psychotropic Code Status Info: DNR Allergies Info: Tramadol Psychotropic Info: busPIRone (BUSPAR) tablet 5 mg daily PO; citalopram (CELEXA) tablet 10 mg daily PO; mirtazapine (REMERON) tablet 7.5 mg daily at bedtime; risperiDONE (RISPERDAL) tablet 0.25 mg daily at bedtime PO         Current Medications (04/01/2019):  This is the current hospital active medication list Current Facility-Administered Medications  Medication Dose Route Frequency Provider Last Rate Last Dose  . acetaminophen (TYLENOL) tablet 325-650 mg  325-650 mg Oral Q6H PRN Nadara Mustarduda, Marcus V, MD   650 mg at 03/31/19 2113  . aspirin EC tablet 325 mg  325 mg Oral Q breakfast Nadara Mustarduda, Marcus V, MD  325 mg at 04/01/19 1056  . bisacodyl (DULCOLAX) EC tablet 5 mg  5 mg Oral Daily PRN Newt Minion, MD      . bisacodyl (DULCOLAX) suppository 10 mg  10 mg Rectal Daily PRN Newt Minion, MD      . busPIRone (BUSPAR) tablet 5 mg  5 mg Oral Daily Newt Minion, MD   5 mg at 04/01/19 1057  . citalopram (CELEXA) tablet 10 mg  10 mg Oral Daily Newt Minion, MD   10 mg at 04/01/19 1057  . docusate sodium  (COLACE) capsule 100 mg  100 mg Oral BID Newt Minion, MD   100 mg at 03/31/19 2112  . feeding supplement (ENSURE ENLIVE) (ENSURE ENLIVE) liquid 237 mL  237 mL Oral BID BM Little Ishikawa, MD   237 mL at 04/01/19 1106  . HYDROcodone-acetaminophen (NORCO/VICODIN) 5-325 MG per tablet 1-2 tablet  1-2 tablet Oral Q4H PRN Newt Minion, MD   1 tablet at 03/31/19 0015  . lactated ringers infusion   Intravenous Continuous Newt Minion, MD 10 mL/hr at 03/28/19 1806    . Melatonin TABS 3 mg  3 mg Oral QHS Newt Minion, MD   3 mg at 03/31/19 2113  . menthol-cetylpyridinium (CEPACOL) lozenge 3 mg  1 lozenge Oral PRN Newt Minion, MD       Or  . phenol (CHLORASEPTIC) mouth spray 1 spray  1 spray Mouth/Throat PRN Newt Minion, MD      . methocarbamol (ROBAXIN) tablet 500 mg  500 mg Oral Q6H PRN Newt Minion, MD       Or  . methocarbamol (ROBAXIN) 500 mg in dextrose 5 % 50 mL IVPB  500 mg Intravenous Q6H PRN Newt Minion, MD      . metoCLOPramide (REGLAN) tablet 5-10 mg  5-10 mg Oral Q8H PRN Newt Minion, MD       Or  . metoCLOPramide (REGLAN) injection 5-10 mg  5-10 mg Intravenous Q8H PRN Newt Minion, MD      . metoprolol tartrate (LOPRESSOR) tablet 12.5 mg  12.5 mg Oral BID Newt Minion, MD   12.5 mg at 04/01/19 1057  . mirtazapine (REMERON) tablet 7.5 mg  7.5 mg Oral QHS Newt Minion, MD   7.5 mg at 03/31/19 2113  . ondansetron (ZOFRAN) tablet 4 mg  4 mg Oral Q6H PRN Newt Minion, MD       Or  . ondansetron [facility]) injection 4 mg  4 mg Intravenous Q6H PRN Newt Minion, MD      . pantoprazole (PROTONIX) EC tablet 40 mg  40 mg Oral Daily Newt Minion, MD   40 mg at 04/01/19 1057  . polyethylene glycol (MIRALAX / GLYCOLAX) packet 17 g  17 g Oral Daily Newt Minion, MD      . polyethylene glycol (MIRALAX / GLYCOLAX) packet 17 g  17 g Oral Daily PRN Newt Minion, MD      . risperiDONE (RISPERDAL) tablet 0.25 mg  0.25 mg Oral QHS Newt Minion, MD   0.25 mg at 03/31/19  2113  . senna (SENOKOT) tablet 17.2 mg  2 tablet Oral QHS Newt Minion, MD   17.2 mg at 03/31/19 2113  . sucralfate (CARAFATE) tablet 1 g  1 g Oral TID WC & HS Newt Minion, MD   1 g at 04/01/19 1057     Discharge Medications: Please see  discharge summary for a list of discharge medications.  Relevant Imaging Results:  Relevant Lab Results:   Additional Information SSN: [id number]  Doy HutchingIsabel H Chasse, [place]

## 2019-04-01 NOTE — Progress Notes (Signed)
Called daughter [name] and updated about the pending discharge plan. Granddaughter at bedside also informed of possible discharge today. Family is very appreciative of care patient has received.

## 2019-04-07 ENCOUNTER — Ambulatory Visit (INDEPENDENT_AMBULATORY_CARE_PROVIDER_SITE_OTHER): Payer: Medicare Other

## 2019-04-07 ENCOUNTER — Inpatient Hospital Stay: Payer: Medicare Other | Admitting: Orthopedic Surgery

## 2019-04-07 ENCOUNTER — Encounter: Payer: Self-pay | Admitting: Family

## 2019-04-07 ENCOUNTER — Ambulatory Visit (INDEPENDENT_AMBULATORY_CARE_PROVIDER_SITE_OTHER): Payer: Medicare Other | Admitting: Family

## 2019-04-07 VITALS — Ht 65.0 in | Wt 108.0 lb

## 2019-04-07 DIAGNOSIS — S72001A Fracture of unspecified part of neck of right femur, initial encounter for closed fracture: Secondary | ICD-10-CM | POA: Diagnosis not present

## 2019-04-07 NOTE — Progress Notes (Signed)
   Post-Op Visit Note   Patient: Roberta Griffin           Date of Birth: [birth year]           MRN: [id number] Visit Date: 04/07/2019 PCP: Jodi Marble, MD  Chief Complaint:  Chief Complaint  Patient presents with  . Right Hip - Routine Post Op    03/28/19 right hip hemi     HPI:  HPI The patient is a 85 year old woman seen status post right hip hemiarthroplasty.  Patient on a stretcher brought in by EMS.  Ortho Exam On examination her incision is clean dry and intact, healing well.  Staples harvested today without incident.  Visit Diagnoses:  1. Closed hip fracture, right, initial encounter [facility])     Plan: She will continue with her range of motion.  Continue incisional care.  Follow-up in the office in 2 weeks.  Follow-Up Instructions: Return in about 2 weeks (around 04/21/2019).   Imaging: No results found.  Orders:  Orders Placed This Encounter  Procedures  . XR HIP UNILAT W OR W/O PELVIS 2-3 VIEWS RIGHT   No orders of the defined types were placed in this encounter.    PMFS History: Patient Active Problem List   Diagnosis Date Noted  . Protein-calorie malnutrition, severe 03/31/2019  . History of pulmonary embolus (PE) 03/28/2019  . Hip fracture ([place]) 03/28/2019  . Fall 10/12/2018  . Hypothermia 10/11/2018  . Subcapital fracture of left hip ([place]) 07/29/2018  . Closed hip fracture, right, initial encounter ([place]) 07/26/2018  . Depression with anxiety 07/26/2017  . High risk medication use 06/03/2017  . Chronic pain of both shoulders 06/03/2017  . Schizophrenia ([place]) 04/26/2017  . GAD (generalized anxiety disorder) 04/05/2017  . History of stroke 12/05/2016  . Pulmonary embolus ([place]) 12/05/2016  . Chronic diastolic CHF (congestive heart failure) ([place]) 03/04/2016  . Insomnia 03/04/2016  . Dysarthria 03/04/2016  . Thyroid nodule 03/04/2016  . Pancreatic mass   . HLD (hyperlipidemia)   . Dysphagia 02/18/2016  . CVA (cerebral vascular accident) ([place])  02/17/2016  . COPD (chronic obstructive pulmonary disease) ([place]) 02/17/2016  . Alzheimer's dementia without behavioral disturbance ([place]) 02/17/2016  . Essential hypertension 02/17/2016  . Normocytic anemia 02/17/2016  . CKD (chronic kidney disease) stage 3, GFR 30-59 ml/min ([place]) 02/17/2016   Past Medical History:  Diagnosis Date  . COPD (chronic obstructive pulmonary disease) ([place])   . Dementia ([place])   . Depression   . Dysphagia   . Hypertension   . Scoliosis     Family History  Problem Relation Age of Onset  . Stroke Neg Hx     Past Surgical History:  Procedure Laterality Date  . ABDOMINAL HYSTERECTOMY    . HIP ARTHROPLASTY Left 07/29/2018   Procedure: ARTHROPLASTY BIPOLAR HIP (HEMIARTHROPLASTY);  Surgeon: Garald Balding, MD;  Location: [place];  Service: Orthopedics;  Laterality: Left;  . HIP ARTHROPLASTY Right 03/28/2019   Procedure: ARTHROPLASTY BIPOLAR HIP (HEMIARTHROPLASTY);  Surgeon: Newt Minion, MD;  Location: [place];  Service: Orthopedics;  Laterality: Right;  . KNEE SURGERY    . SHOULDER SURGERY    . VITRECTOMY AND CATARACT     Social History   Occupational History  . Not on file  Tobacco Use  . Smoking status: Never Smoker  . Smokeless tobacco: Never Used  Substance and Sexual Activity  . Alcohol use: No  . Drug use: No  . Sexual activity: Not on file

## 2019-04-13 ENCOUNTER — Other Ambulatory Visit: Payer: Self-pay

## 2019-04-13 ENCOUNTER — Non-Acute Institutional Stay: Payer: Medicare Other | Admitting: Internal Medicine

## 2019-04-14 ENCOUNTER — Other Ambulatory Visit: Payer: Self-pay

## 2019-11-16 ENCOUNTER — Non-Acute Institutional Stay: Payer: Medicare (Managed Care) | Admitting: Internal Medicine

## 2019-11-16 ENCOUNTER — Other Ambulatory Visit: Payer: Self-pay

## 2020-01-18 NOTE — Progress Notes (Signed)
Therapist, nutritional Palliative Care Consult Note Telephone: [phone]  Fax: [phone]  PATIENT NAME: Roberta Griffin DOB: Sep 07, 1925 MRN: [id number]  PRIMARY CARE PROVIDER:   Sherron Monday, MD  REFERRING PROVIDER:  Sherron Monday, MD  RESPONSIBLE PARTYAbbott Pao Daughter [phone]     RECOMMENDATIONS and PLAN:  Palliative care encounter Z51.5  1.  Advance care planning:  Previous DNR status established, MOST form is complete with selections of comfort measures, no antibiotics, IV fluids or tube feedings.  Previous discussions with daughter expressed goals of comfort care and avoid re-hospitalization.  She will remain a long-term patient of snf.  Palliative care will f/u with patient.   2.  Alzheimer's dementia without behaviors:  FAST stage 7c. Continue supportive care.  Monitor for changes of cognitive and functional status.   3.  Fall risk:  Fall risk prevention encouraged.  Cushion mat at bedside.   I spent 30 minutes providing this consultation,  from 1600 to 1630. More than 50% of the time in this consultation was spent coordinating communication with patient and clinical staff.   HISTORY OF PRESENT ILLNESS: Follow-up with [name].  Clinical staff reports that pt still requires total assistance of all ADLs.  She is incontinent of B&B and requires feedings and eats at least 25-50% of meals.  She is non-ambulatory. She fell on 01/11/19 and was evaluated at local ER.  No major injuries noted.  Palliative Care was asked to help address goals of care.   CODE STATUS: DNAR/DNI  PPS: 30% HOSPICE ELIGIBILITY/DIAGNOSIS: TBD  PAST MEDICAL HISTORY:  Past Medical History:  Diagnosis Date  . COPD (chronic obstructive pulmonary disease) (HCC)   . Dementia (HCC)   . Depression   . Dysphagia   . Hypertension   . Scoliosis      PERTINENT MEDICATIONS:  Outpatient Encounter Medications as of 01/19/2019  Medication Sig  . busPIRone  (BUSPAR) 15 MG tablet Take 5 mg by mouth daily.   . citalopram (CELEXA) 10 MG tablet Take 10 mg by mouth daily.  Marland Kitchen docusate sodium (COLACE) 100 MG capsule Take 1 capsule (100 mg total) by mouth 2 (two) times daily.  . Melatonin 3 MG TABS Take 3 mg by mouth at bedtime.   . mirtazapine (REMERON) 7.5 MG tablet Take 7.5 mg by mouth at bedtime.  . polyethylene glycol (MIRALAX) packet Take 17 g by mouth daily as needed for moderate constipation. (Patient taking differently: Take 17 g by mouth See admin instructions. Daily and as needed for constipation)  . risperiDONE (RISPERDAL) 0.25 MG tablet Take 0.25 mg by mouth at bedtime.   . [DISCONTINUED] acetaminophen (TYLENOL) 500 MG tablet Take 500 mg by mouth every 8 (eight) hours as needed for mild pain.   . [DISCONTINUED] apixaban (ELIQUIS) 2.5 MG TABS tablet Take 2.5 mg by mouth 2 (two) times daily.  . [DISCONTINUED] divalproex (DEPAKOTE) 125 MG DR tablet Take 125 mg by mouth daily.  . [DISCONTINUED] ENSURE (ENSURE) Take 120 mLs by mouth 3 (three) times daily between meals.  . [DISCONTINUED] HYDROcodone-acetaminophen (NORCO/VICODIN) 5-325 MG tablet Take 1 tablet by mouth every 6 (six) hours as needed for severe pain. (Patient not taking: Reported on 03/28/2019)  . [DISCONTINUED] memantine (NAMENDA XR) 14 MG CP24 24 hr capsule Take 14 mg by mouth daily.  . [DISCONTINUED] metoprolol succinate (TOPROL-XL) 50 MG 24 hr tablet Take 1 tablet (50 mg total) by mouth daily. Take with or immediately following a meal. (Patient  not taking: Reported on 03/28/2019)  . [DISCONTINUED] Nutritional Supplements (NUTRITIONAL SUPPLEMENT PO) Take 1 Can by mouth 3 (three) times daily.   . [DISCONTINUED] Vitamin D, Ergocalciferol, (DRISDOL) 50000 units CAPS capsule Take 50,000 Units by mouth every 30 (thirty) days.    No facility-administered encounter medications on file as of 01/19/2019.    PHYSICAL EXAM:   General: NAD, frail appearing, thin elderly female in  wc Cardiovascular: regular rate and rhythm Pulmonary: clear ant fields Abdomen: soft, nontender, + bowel sounds Extremities: no edema Skin: exposed skin is intact Neurological: Somnolent but arouses easily. Limited speech  Weakness.  Gonzella Lex, NP-C

## 2020-09-21 ENCOUNTER — Inpatient Hospital Stay (HOSPITAL_COMMUNITY): Payer: Medicare (Managed Care) | Admitting: Certified Registered Nurse Anesthetist

## 2020-09-21 ENCOUNTER — Other Ambulatory Visit: Payer: Self-pay

## 2020-09-21 ENCOUNTER — Encounter (HOSPITAL_COMMUNITY)
Admission: EM | Disposition: A | Discharge status: Skilled Nursing Facility | Payer: Self-pay | Source: Skilled Nursing Facility | Attending: Specialist

## 2020-09-21 ENCOUNTER — Emergency Department (HOSPITAL_COMMUNITY): Payer: Medicare (Managed Care)

## 2020-09-21 ENCOUNTER — Encounter (HOSPITAL_COMMUNITY): Payer: Self-pay | Admitting: Emergency Medicine

## 2020-09-21 ENCOUNTER — Inpatient Hospital Stay (HOSPITAL_COMMUNITY)
Admission: EM | Admit: 2020-09-21 | Discharge: 2020-09-26 | DRG: 498 | Disposition: A | Discharge status: Skilled Nursing Facility | Payer: Medicare (Managed Care) | Source: Skilled Nursing Facility | Attending: Specialist | Admitting: Specialist

## 2020-09-21 DIAGNOSIS — F0280 Dementia in other diseases classified elsewhere without behavioral disturbance: Secondary | ICD-10-CM | POA: Diagnosis present

## 2020-09-21 DIAGNOSIS — Z7401 Bed confinement status: Secondary | ICD-10-CM | POA: Diagnosis not present

## 2020-09-21 DIAGNOSIS — S7291XB Unspecified fracture of right femur, initial encounter for open fracture type I or II: Secondary | ICD-10-CM | POA: Diagnosis present

## 2020-09-21 DIAGNOSIS — I639 Cerebral infarction, unspecified: Secondary | ICD-10-CM | POA: Diagnosis present

## 2020-09-21 DIAGNOSIS — F419 Anxiety disorder, unspecified: Secondary | ICD-10-CM | POA: Diagnosis present

## 2020-09-21 DIAGNOSIS — Z7982 Long term (current) use of aspirin: Secondary | ICD-10-CM | POA: Diagnosis not present

## 2020-09-21 DIAGNOSIS — S72451R Displaced supracondylar fracture without intracondylar extension of lower end of right femur, subsequent encounter for open fracture type IIIA, IIIB, or IIIC with malunion: Secondary | ICD-10-CM | POA: Diagnosis not present

## 2020-09-21 DIAGNOSIS — M868X5 Other osteomyelitis, thigh: Secondary | ICD-10-CM | POA: Diagnosis present

## 2020-09-21 DIAGNOSIS — Z96643 Presence of artificial hip joint, bilateral: Secondary | ICD-10-CM | POA: Diagnosis present

## 2020-09-21 DIAGNOSIS — Z8673 Personal history of transient ischemic attack (TIA), and cerebral infarction without residual deficits: Secondary | ICD-10-CM

## 2020-09-21 DIAGNOSIS — G309 Alzheimer's disease, unspecified: Secondary | ICD-10-CM | POA: Diagnosis present

## 2020-09-21 DIAGNOSIS — Z20822 Contact with and (suspected) exposure to covid-19: Secondary | ICD-10-CM | POA: Diagnosis present

## 2020-09-21 DIAGNOSIS — S72451B Displaced supracondylar fracture without intracondylar extension of lower end of right femur, initial encounter for open fracture type I or II: Secondary | ICD-10-CM | POA: Diagnosis present

## 2020-09-21 DIAGNOSIS — S72001A Fracture of unspecified part of neck of right femur, initial encounter for closed fracture: Secondary | ICD-10-CM | POA: Diagnosis present

## 2020-09-21 DIAGNOSIS — S72453A Displaced supracondylar fracture without intracondylar extension of lower end of unspecified femur, initial encounter for closed fracture: Secondary | ICD-10-CM | POA: Diagnosis present

## 2020-09-21 DIAGNOSIS — J449 Chronic obstructive pulmonary disease, unspecified: Secondary | ICD-10-CM | POA: Diagnosis present

## 2020-09-21 DIAGNOSIS — F209 Schizophrenia, unspecified: Secondary | ICD-10-CM | POA: Diagnosis present

## 2020-09-21 DIAGNOSIS — M86251 Subacute osteomyelitis, right femur: Secondary | ICD-10-CM

## 2020-09-21 DIAGNOSIS — S80211A Abrasion, right knee, initial encounter: Secondary | ICD-10-CM | POA: Diagnosis present

## 2020-09-21 DIAGNOSIS — T8453XA Infection and inflammatory reaction due to internal right knee prosthesis, initial encounter: Secondary | ICD-10-CM | POA: Diagnosis not present

## 2020-09-21 DIAGNOSIS — I129 Hypertensive chronic kidney disease with stage 1 through stage 4 chronic kidney disease, or unspecified chronic kidney disease: Secondary | ICD-10-CM | POA: Diagnosis present

## 2020-09-21 DIAGNOSIS — F32A Depression, unspecified: Secondary | ICD-10-CM | POA: Diagnosis present

## 2020-09-21 DIAGNOSIS — M25461 Effusion, right knee: Secondary | ICD-10-CM | POA: Diagnosis present

## 2020-09-21 DIAGNOSIS — N1832 Chronic kidney disease, stage 3b: Secondary | ICD-10-CM | POA: Diagnosis present

## 2020-09-21 DIAGNOSIS — Z96653 Presence of artificial knee joint, bilateral: Secondary | ICD-10-CM | POA: Diagnosis present

## 2020-09-21 DIAGNOSIS — N183 Chronic kidney disease, stage 3 unspecified: Secondary | ICD-10-CM | POA: Diagnosis present

## 2020-09-21 DIAGNOSIS — Z886 Allergy status to analgesic agent status: Secondary | ICD-10-CM

## 2020-09-21 DIAGNOSIS — Z79899 Other long term (current) drug therapy: Secondary | ICD-10-CM | POA: Diagnosis not present

## 2020-09-21 DIAGNOSIS — Z452 Encounter for adjustment and management of vascular access device: Secondary | ICD-10-CM

## 2020-09-21 DIAGNOSIS — S7290XA Unspecified fracture of unspecified femur, initial encounter for closed fracture: Secondary | ICD-10-CM | POA: Diagnosis present

## 2020-09-21 HISTORY — PX: INCISION AND DRAINAGE: SHX5863

## 2020-09-21 LAB — COMPREHENSIVE METABOLIC PANEL
ALT: 20 U/L (ref 0–44)
AST: 23 U/L (ref 15–41)
Albumin: 3.7 g/dL (ref 3.5–5.0)
Alkaline Phosphatase: 85 U/L (ref 38–126)
Anion gap: 8 (ref 5–15)
BUN: 14 mg/dL (ref 8–23)
CO2: 28 mmol/L (ref 22–32)
Calcium: 9.8 mg/dL (ref 8.9–10.3)
Chloride: 100 mmol/L (ref 98–111)
Creatinine, Ser: 0.97 mg/dL (ref 0.44–1.00)
GFR, Estimated: 55 mL/min — ABNORMAL LOW (ref >60)
Glucose, Bld: 91 mg/dL (ref 70–99)
Potassium: 4.5 mmol/L (ref 3.5–5.1)
Sodium: 136 mmol/L (ref 135–145)
Total Bilirubin: 1 mg/dL (ref 0.3–1.2)
Total Protein: 7.4 g/dL (ref 6.5–8.1)

## 2020-09-21 LAB — RESP PANEL BY RT-PCR (FLU A&B, COVID) ARPGX2
Influenza A by PCR: NEGATIVE
Influenza B by PCR: NEGATIVE
SARS Coronavirus 2 by RT PCR: NEGATIVE

## 2020-09-21 LAB — CBC WITH DIFFERENTIAL/PLATELET
Abs Immature Granulocytes: 0.02 10*3/uL (ref 0.00–0.07)
Basophils Absolute: 0 10*3/uL (ref 0.0–0.1)
Basophils Relative: 0 %
Eosinophils Absolute: 0.2 10*3/uL (ref 0.0–0.5)
Eosinophils Relative: 2 %
HCT: 38.3 % (ref 36.0–46.0)
Hemoglobin: 12.6 g/dL (ref 12.0–15.0)
Immature Granulocytes: 0 %
Lymphocytes Relative: 13 %
Lymphs Abs: 1.2 10*3/uL (ref 0.7–4.0)
MCH: 28.1 pg (ref 26.0–34.0)
MCHC: 32.9 g/dL (ref 30.0–36.0)
MCV: 85.5 fL (ref 80.0–100.0)
Monocytes Absolute: 0.9 10*3/uL (ref 0.1–1.0)
Monocytes Relative: 10 %
Neutro Abs: 6.9 10*3/uL (ref 1.7–7.7)
Neutrophils Relative %: 75 %
Platelets: 291 10*3/uL (ref 150–400)
RBC: 4.48 MIL/uL (ref 3.87–5.11)
RDW: 13.9 % (ref 11.5–15.5)
WBC: 9.2 10*3/uL (ref 4.0–10.5)
nRBC: 0 % (ref 0.0–0.2)

## 2020-09-21 SURGERY — INCISION AND DRAINAGE
Anesthesia: General | Laterality: Right

## 2020-09-21 MED ORDER — METOCLOPRAMIDE HCL 5 MG PO TABS: 5.0000 mg | ORAL_TABLET | Freq: Three times a day (TID) | ORAL | Status: DC | PRN

## 2020-09-21 MED ORDER — POLYETHYLENE GLYCOL 3350 17 G PO PACK
17.0000 g | PACK | Freq: Every day | ORAL | Status: DC | PRN
  Administered 2020-09-23: 17 g via ORAL
  Filled 2020-09-21: qty 1

## 2020-09-21 MED ORDER — ONDANSETRON HCL 4 MG/2ML IJ SOLN
INTRAMUSCULAR | Status: AC
  Filled 2020-09-21: qty 2

## 2020-09-21 MED ORDER — METOCLOPRAMIDE HCL 5 MG/ML IJ SOLN: 5.0000 mg | Freq: Three times a day (TID) | INTRAMUSCULAR | Status: DC | PRN

## 2020-09-21 MED ORDER — HYDROCODONE-ACETAMINOPHEN 5-325 MG PO TABS
1.0000 | ORAL_TABLET | ORAL | Status: DC | PRN
  Administered 2020-09-22: 1 via ORAL
  Administered 2020-09-22 – 2020-09-24 (×2): 2 via ORAL
  Filled 2020-09-21 (×2): qty 2

## 2020-09-21 MED ORDER — ENOXAPARIN SODIUM 40 MG/0.4ML ~~LOC~~ SOLN: 40.0000 mg | SUBCUTANEOUS | Status: DC

## 2020-09-21 MED ORDER — LIDOCAINE HCL (CARDIAC) PF 100 MG/5ML IV SOSY: PREFILLED_SYRINGE | INTRAVENOUS | Status: DC | PRN

## 2020-09-21 MED ORDER — LACTATED RINGERS IV SOLN: INTRAVENOUS | Status: DC | PRN

## 2020-09-21 MED ORDER — FENTANYL CITRATE (PF) 100 MCG/2ML IJ SOLN
INTRAMUSCULAR | Status: AC
  Filled 2020-09-21: qty 2

## 2020-09-21 MED ORDER — POVIDONE-IODINE 10 % EX SWAB
2.0000 "application " | Freq: Once | CUTANEOUS | Status: AC
  Administered 2020-09-21: 2 via TOPICAL

## 2020-09-21 MED ORDER — ROCURONIUM BROMIDE 100 MG/10ML IV SOLN: INTRAVENOUS | Status: DC | PRN

## 2020-09-21 MED ORDER — FENTANYL CITRATE (PF) 100 MCG/2ML IJ SOLN: 25.0000 ug | INTRAMUSCULAR | Status: DC | PRN

## 2020-09-21 MED ORDER — POVIDONE-IODINE 10 % EX SWAB: 2.0000 "application " | Freq: Once | CUTANEOUS | Status: DC

## 2020-09-21 MED ORDER — ACETAMINOPHEN 500 MG PO TABS
500.0000 mg | ORAL_TABLET | Freq: Four times a day (QID) | ORAL | Status: AC
  Administered 2020-09-21 – 2020-09-22 (×4): 500 mg via ORAL
  Filled 2020-09-21 (×4): qty 1

## 2020-09-21 MED ORDER — MORPHINE SULFATE (PF) 2 MG/ML IV SOLN
0.5000 mg | INTRAVENOUS | Status: DC | PRN
Start: 2020-09-21 — End: 2020-09-21
  Administered 2020-09-21: 0.5 mg via INTRAVENOUS
  Filled 2020-09-21: qty 1

## 2020-09-21 MED ORDER — CEFAZOLIN SODIUM-DEXTROSE 2-4 GM/100ML-% IV SOLN
2.0000 g | Freq: Once | INTRAVENOUS | Status: AC
  Administered 2020-09-21: 2 g via INTRAVENOUS

## 2020-09-21 MED ORDER — DOCUSATE SODIUM 100 MG PO CAPS
100.0000 mg | ORAL_CAPSULE | Freq: Two times a day (BID) | ORAL | Status: DC
  Administered 2020-09-21 – 2020-09-26 (×8): 100 mg via ORAL
  Filled 2020-09-21 (×8): qty 1

## 2020-09-21 MED ORDER — MORPHINE SULFATE (PF) 2 MG/ML IV SOLN
0.5000 mg | INTRAVENOUS | Status: DC | PRN
  Administered 2020-09-21: 0.5 mg via INTRAVENOUS
  Administered 2020-09-22 – 2020-09-25 (×3): 1 mg via INTRAVENOUS
  Filled 2020-09-21 (×4): qty 1

## 2020-09-21 MED ORDER — PHENYLEPHRINE 40 MCG/ML (10ML) SYRINGE FOR IV PUSH (FOR BLOOD PRESSURE SUPPORT)
PREFILLED_SYRINGE | INTRAVENOUS | Status: DC | PRN
  Administered 2020-09-21: 120 ug via INTRAVENOUS
  Administered 2020-09-21: 80 ug via INTRAVENOUS
  Administered 2020-09-21: 40 ug via INTRAVENOUS

## 2020-09-21 MED ORDER — AMISULPRIDE (ANTIEMETIC) 5 MG/2ML IV SOLN: 10.0000 mg | Freq: Once | INTRAVENOUS | Status: DC | PRN

## 2020-09-21 MED ORDER — ONDANSETRON HCL 4 MG/2ML IJ SOLN
4.0000 mg | Freq: Four times a day (QID) | INTRAMUSCULAR | Status: DC | PRN
  Administered 2020-09-23: 4 mg via INTRAVENOUS
  Filled 2020-09-21: qty 2

## 2020-09-21 MED ORDER — ROCURONIUM BROMIDE 10 MG/ML (PF) SYRINGE
PREFILLED_SYRINGE | INTRAVENOUS | Status: AC
  Filled 2020-09-21: qty 10

## 2020-09-21 MED ORDER — ONDANSETRON HCL 4 MG PO TABS: 4.0000 mg | ORAL_TABLET | Freq: Four times a day (QID) | ORAL | Status: DC | PRN

## 2020-09-21 MED ORDER — CEFAZOLIN SODIUM-DEXTROSE 2-4 GM/100ML-% IV SOLN
2.0000 g | Freq: Three times a day (TID) | INTRAVENOUS | Status: AC
  Administered 2020-09-22 (×3): 2 g via INTRAVENOUS
  Filled 2020-09-21 (×3): qty 100

## 2020-09-21 MED ORDER — METOPROLOL TARTRATE 5 MG/5ML IV SOLN: 5.0000 mg | Freq: Three times a day (TID) | INTRAVENOUS | Status: DC | PRN

## 2020-09-21 MED ORDER — SUGAMMADEX SODIUM 200 MG/2ML IV SOLN
INTRAVENOUS | Status: DC | PRN
  Administered 2020-09-21: 200 mg via INTRAVENOUS

## 2020-09-21 MED ORDER — LABETALOL HCL 5 MG/ML IV SOLN: 10.0000 mg | INTRAVENOUS | Status: DC | PRN

## 2020-09-21 MED ORDER — METOPROLOL TARTRATE 5 MG/5ML IV SOLN
5.0000 mg | Freq: Once | INTRAVENOUS | Status: AC
  Administered 2020-09-21: 5 mg via INTRAVENOUS
  Filled 2020-09-21: qty 5

## 2020-09-21 MED ORDER — CHLORHEXIDINE GLUCONATE 4 % EX LIQD: 60.0000 mL | Freq: Once | CUTANEOUS | Status: DC

## 2020-09-21 MED ORDER — DEXAMETHASONE SODIUM PHOSPHATE 10 MG/ML IJ SOLN
INTRAMUSCULAR | Status: DC | PRN
  Administered 2020-09-21: 4 mg via INTRAVENOUS

## 2020-09-21 MED ORDER — FENTANYL CITRATE (PF) 100 MCG/2ML IJ SOLN
INTRAMUSCULAR | Status: DC | PRN
  Administered 2020-09-21 (×2): 25 ug via INTRAVENOUS

## 2020-09-21 MED ORDER — LACTATED RINGERS IV SOLN: INTRAVENOUS | Status: DC

## 2020-09-21 MED ORDER — CEFAZOLIN SODIUM-DEXTROSE 2-4 GM/100ML-% IV SOLN
INTRAVENOUS | Status: AC
  Filled 2020-09-21: qty 100

## 2020-09-21 MED ORDER — ROCURONIUM BROMIDE 10 MG/ML (PF) SYRINGE
PREFILLED_SYRINGE | INTRAVENOUS | Status: DC | PRN
  Administered 2020-09-21: 30 mg via INTRAVENOUS

## 2020-09-21 MED ORDER — PHENOL 1.4 % MT LIQD: 1.0000 | OROMUCOSAL | Status: DC | PRN

## 2020-09-21 MED ORDER — ACETAMINOPHEN 325 MG PO TABS
325.0000 mg | ORAL_TABLET | Freq: Four times a day (QID) | ORAL | Status: DC | PRN
  Administered 2020-09-23 (×2): 325 mg via ORAL
  Administered 2020-09-24 – 2020-09-25 (×2): 650 mg via ORAL
  Filled 2020-09-21: qty 2
  Filled 2020-09-21: qty 1
  Filled 2020-09-21: qty 2
  Filled 2020-09-21: qty 1

## 2020-09-21 MED ORDER — ENOXAPARIN SODIUM 30 MG/0.3ML ~~LOC~~ SOLN
30.0000 mg | SUBCUTANEOUS | Status: DC
  Administered 2020-09-22 – 2020-09-26 (×5): 30 mg via SUBCUTANEOUS
  Filled 2020-09-21 (×5): qty 0.3

## 2020-09-21 MED ORDER — HYDROCODONE-ACETAMINOPHEN 7.5-325 MG PO TABS
1.0000 | ORAL_TABLET | ORAL | Status: DC | PRN
  Administered 2020-09-22 – 2020-09-23 (×4): 1 via ORAL
  Filled 2020-09-21 (×4): qty 1

## 2020-09-21 MED ORDER — PANTOPRAZOLE SODIUM 40 MG PO TBEC
40.0000 mg | DELAYED_RELEASE_TABLET | Freq: Every day | ORAL | Status: DC
  Administered 2020-09-22 – 2020-09-26 (×5): 40 mg via ORAL
  Filled 2020-09-21 (×5): qty 1

## 2020-09-21 MED ORDER — PROPOFOL 10 MG/ML IV BOLUS
INTRAVENOUS | Status: DC | PRN
  Administered 2020-09-21: 50 mg via INTRAVENOUS

## 2020-09-21 MED ORDER — LIDOCAINE 2% (20 MG/ML) 5 ML SYRINGE
INTRAMUSCULAR | Status: DC | PRN
  Administered 2020-09-21: 60 mg via INTRAVENOUS

## 2020-09-21 MED ORDER — POLYETHYLENE GLYCOL 3350 17 G PO PACK: 17.0000 g | PACK | Freq: Every day | ORAL | Status: DC | PRN

## 2020-09-21 MED ORDER — BISACODYL 5 MG PO TBEC: 5.0000 mg | DELAYED_RELEASE_TABLET | Freq: Every day | ORAL | Status: DC | PRN

## 2020-09-21 MED ORDER — SODIUM CHLORIDE 0.9 % IR SOLN
Status: DC | PRN
  Administered 2020-09-21: 3000 mL

## 2020-09-21 MED ORDER — HYDROCODONE-ACETAMINOPHEN 5-325 MG PO TABS
1.0000 | ORAL_TABLET | Freq: Four times a day (QID) | ORAL | Status: DC | PRN
Start: 2020-09-21 — End: 2020-09-22
  Filled 2020-09-21: qty 1

## 2020-09-21 MED ORDER — ONDANSETRON HCL 4 MG/2ML IJ SOLN
INTRAMUSCULAR | Status: DC | PRN
  Administered 2020-09-21: 4 mg via INTRAVENOUS

## 2020-09-21 MED ORDER — PHENYLEPHRINE HCL (PRESSORS) 10 MG/ML IV SOLN: INTRAVENOUS | Status: DC | PRN

## 2020-09-21 MED ORDER — MENTHOL 3 MG MT LOZG: 1.0000 | LOZENGE | OROMUCOSAL | Status: DC | PRN

## 2020-09-21 MED ORDER — PROPOFOL 10 MG/ML IV BOLUS
INTRAVENOUS | Status: AC
  Filled 2020-09-21: qty 20

## 2020-09-21 SURGICAL SUPPLY — 44 items
BAG ZIPLOCK 12X15 (MISCELLANEOUS) IMPLANT
BANDAGE ESMARK 6X9 LF (GAUZE/BANDAGES/DRESSINGS) IMPLANT
BLADE SAW SGTL 11.0X1.19X90.0M (BLADE) IMPLANT
BNDG ELASTIC 6X10 VLCR STRL LF (GAUZE/BANDAGES/DRESSINGS) ×2 IMPLANT
BNDG ESMARK 6X9 LF (GAUZE/BANDAGES/DRESSINGS)
BNDG GAUZE ELAST 4 BULKY (GAUZE/BANDAGES/DRESSINGS) IMPLANT
COVER SURGICAL LIGHT HANDLE (MISCELLANEOUS) ×2 IMPLANT
COVER WAND RF STERILE (DRAPES) IMPLANT
CUFF TOURN SGL QUICK 24 (TOURNIQUET CUFF) ×1
CUFF TRNQT CYL 24X4X16.5-23 (TOURNIQUET CUFF) ×1 IMPLANT
DRSG EMULSION OIL 3X3 NADH (GAUZE/BANDAGES/DRESSINGS) ×2 IMPLANT
DRSG PAD ABDOMINAL 8X10 ST (GAUZE/BANDAGES/DRESSINGS) ×4 IMPLANT
ELECT PENCIL ROCKER SW 15FT (MISCELLANEOUS) ×2 IMPLANT
ELECT REM PT RETURN 15FT ADLT (MISCELLANEOUS) ×2 IMPLANT
GAUZE PACKING IODOFORM 1/4X15 (PACKING) ×2 IMPLANT
GAUZE SPONGE 4X4 12PLY STRL (GAUZE/BANDAGES/DRESSINGS) ×2 IMPLANT
GLOVE ECLIPSE 8.0 STRL XLNG CF (GLOVE) IMPLANT
GLOVE ECLIPSE 8.5 STRL (GLOVE) ×2 IMPLANT
GLOVE ORTHO TXT STRL SZ7.5 (GLOVE) IMPLANT
GLOVE SS PI 9.0 STRL (GLOVE) ×2 IMPLANT
GLOVE SURG ORTHO LTX SZ8 (GLOVE) IMPLANT
GLOVE SURG UNDER POLY LF SZ7.5 (GLOVE) IMPLANT
GLOVE SURG UNDER POLY LF SZ8.5 (GLOVE) IMPLANT
GOWN STRL REUS W/TWL 2XL LVL3 (GOWN DISPOSABLE) ×2 IMPLANT
GOWN STRL REUS W/TWL LRG LVL3 (GOWN DISPOSABLE) IMPLANT
GOWN STRL REUS W/TWL XL LVL3 (GOWN DISPOSABLE) ×2 IMPLANT
HANDPIECE INTERPULSE COAX TIP (DISPOSABLE)
IMMOBILIZER KNEE 20 (SOFTGOODS) ×2
IMMOBILIZER KNEE 20 THIGH 36 (SOFTGOODS) ×1 IMPLANT
KIT BASIN OR (CUSTOM PROCEDURE TRAY) ×2 IMPLANT
KIT TURNOVER KIT A (KITS) ×2 IMPLANT
MANIFOLD NEPTUNE II (INSTRUMENTS) ×2 IMPLANT
PACK ABDOMINAL GYN (CUSTOM PROCEDURE TRAY) ×2 IMPLANT
PAD CAST 4YDX4 CTTN HI CHSV (CAST SUPPLIES) ×2 IMPLANT
PADDING CAST COTTON 4X4 STRL (CAST SUPPLIES) ×2
PROTECTOR NERVE ULNAR (MISCELLANEOUS) ×2 IMPLANT
SET HNDPC FAN SPRY TIP SCT (DISPOSABLE) IMPLANT
SET IRRIG Y TYPE TUR BLADDER L (SET/KITS/TRAYS/PACK) ×2 IMPLANT
SUT PROLENE 2 0 CT2 30 (SUTURE) ×2 IMPLANT
SWAB COLLECTION DEVICE MRSA (MISCELLANEOUS) ×2 IMPLANT
SWAB CULTURE ESWAB REG 1ML (MISCELLANEOUS) ×2 IMPLANT
SYR CONTROL 10ML LL (SYRINGE) IMPLANT
TOWEL OR 17X26 10 PK STRL BLUE (TOWEL DISPOSABLE) ×2 IMPLANT
YANKAUER SUCT BULB TIP 10FT TU (MISCELLANEOUS) ×2 IMPLANT

## 2020-09-21 NOTE — H&P (Signed)
History and Physical        Hospital Admission Note Date: 09/21/2020  Patient name: Roberta Griffin Medical record number: [id number] Date of birth: 10/10/1925 Age: 87 y.o. Gender: female  PCP: Sherron Monday, MD  Patient coming from: [place] At baseline, ambulates: Bedbound  Event organiser Complaint  Patient presents with  . abnormal x-ray       HPI:   Patient has advanced dementia and is unable to provide much history  This is a 87 year old female with past medical history of COPD, dementia, CKD 3b, hypertension, CVA, bilateral hip arthroplasty who presented to the ED via EMS from her SNF for right knee swelling and pain for an unknown period of time. Per granddaughter over the phone, the staff at the facility were trying to move her at some point last week and she hit her leg and initially thought she only had a superficial abrasion.   ED Course: Afebrile, hypertensive, on room air. Labs ordered and pending. Notable Imaging: Right knee Xray - subacute long spiral fracture of the distal right femur extending form the shaft to the total knee hardware, one full shaft width posterior displacement with overriding of at least 6 cm and medial angulation. Orthopedic surgery was consulted in the ED and Dr. Otelia Sergeant plans to take to the OR.   Vitals:   09/21/20 1415 09/21/20 1430  BP:    Pulse: 71 70  Resp: 19 16  Temp:    SpO2: 99% 98%     Review of Systems:  Review of Systems  Unable to perform ROS: Dementia  When asked if she has pain in her right leg she reports that it doesn't hurt but feels 'nervous'  Medical/Social/Family History   Past Medical History: Past Medical History:  Diagnosis Date  . COPD (chronic obstructive pulmonary disease) (HCC)   . Dementia (HCC)   . Depression   . Dysphagia   . Hypertension   . Scoliosis     Past Surgical  History:  Procedure Laterality Date  . ABDOMINAL HYSTERECTOMY    . HIP ARTHROPLASTY Left 07/29/2018   Procedure: ARTHROPLASTY BIPOLAR HIP (HEMIARTHROPLASTY);  Surgeon: Valeria Batman, MD;  Location: [facility] OR;  Service: Orthopedics;  Laterality: Left;  . HIP ARTHROPLASTY Right 03/28/2019   Procedure: ARTHROPLASTY BIPOLAR HIP (HEMIARTHROPLASTY);  Surgeon: Nadara Mustard, MD;  Location: [facility] OR;  Service: Orthopedics;  Laterality: Right;  . KNEE SURGERY    . SHOULDER SURGERY    . VITRECTOMY AND CATARACT      Medications: Prior to Admission medications   Medication Sig Start Date End Date Taking? Authorizing Provider  acetaminophen (TYLENOL) 500 MG tablet Take 1 tablet (500 mg total) by mouth every 6 (six) hours as needed for mild pain. Patient taking differently: Take 1,000 mg by mouth 3 (three) times daily. 03/28/19  Yes Nadara Mustard, MD  aspirin 81 MG chewable tablet Chew 1 tablet (81 mg total) by mouth daily. 04/02/19  Yes Roberto Scales D, MD  bisacodyl (DULCOLAX) 5 MG EC tablet Take 1 tablet (5 mg total) by mouth daily as needed for moderate constipation. 04/01/19  Yes Roberto Scales D, MD  docusate sodium (COLACE) 100 MG  capsule Take 1 capsule (100 mg total) by mouth 2 (two) times daily. 07/31/18  Yes Burnadette Pop, MD  Ensure (ENSURE) Take 237 mLs by mouth 2 (two) times daily between meals.   Yes [provider]  Melatonin 3 MG TABS Take 3 mg by mouth at bedtime.  05/18/18  Yes [provider]  metoprolol tartrate (LOPRESSOR) 25 MG tablet Take 0.5 tablets (12.5 mg total) by mouth 2 (two) times daily. 04/01/19  Yes Roberto Scales D, MD  Multiple Vitamin (MULTIVITAMIN WITH MINERALS) TABS tablet Take 1 tablet by mouth daily.   Yes [provider]  omeprazole (PRILOSEC) 20 MG capsule Take 20 mg by mouth daily.   Yes [provider]  polyethylene glycol (MIRALAX) packet Take 17 g by mouth daily as needed for moderate constipation. Patient taking differently:  Take 17 g by mouth See admin instructions. Daily and as needed for constipation 07/31/18  Yes Adhikari, Amrit, MD  PROTEIN PO Take 30 mLs by mouth daily.   Yes [provider]  senna (SENOKOT) 8.6 MG TABS tablet Take 2 tablets by mouth at bedtime.   Yes [provider]  sucralfate (CARAFATE) 1 g tablet Take 1 g by mouth 4 (four) times daily -  with meals and at bedtime.   Yes [provider]  vitamin C (ASCORBIC ACID) 500 MG tablet Take 500 mg by mouth daily.   Yes [provider]  zinc sulfate 220 (50 Zn) MG capsule Take 220 mg by mouth daily.   Yes [provider]    Allergies:   Allergies  Allergen Reactions  . Tramadol Other (See Comments)    twitching    Social History:  reports that she has never smoked. She has never used smokeless tobacco. She reports that she does not drink alcohol and does not use drugs.  Family History: Family History  Problem Relation Age of Onset  . Stroke Neg Hx      Objective   Physical Exam: Blood pressure (!) 151/121, pulse 70, temperature 98 F (36.7 C), temperature source Rectal, resp. rate 16, height 5\' 5"  (1.651 m), weight 49 kg, SpO2 98 %.  Physical Exam Vitals and nursing note reviewed.  Constitutional:      General: She is not in acute distress.    Comments: Frail elderly female  HENT:     Head: Normocephalic.     Mouth/Throat:     Mouth: Mucous membranes are moist.  Eyes:     Conjunctiva/sclera: Conjunctivae normal.  Cardiovascular:     Rate and Rhythm: Normal rate and regular rhythm.  Pulmonary:     Effort: Pulmonary effort is normal.     Breath sounds: Normal breath sounds.  Abdominal:     General: Abdomen is flat. There is no distension.  Musculoskeletal:     Comments: Laying on right side somewhat with left leg crossed over right RLE knee superficial abrasion   Skin:    Coloration: Skin is not jaundiced or pale.  Neurological:     Mental Status: She is alert.      Comments: Pleasantly confused  Psychiatric:        Mood and Affect: Mood normal.     LABS on Admission: I have personally reviewed all the labs and imaging below    Basic Metabolic Panel: No results for input(s): NA, K, CL, CO2, GLUCOSE, BUN, CREATININE, CALCIUM, MG, PHOS in the last 168 hours. Liver Function Tests: No results for input(s): AST, ALT, ALKPHOS, BILITOT, PROT,  ALBUMIN in the last 168 hours. No results for input(s): LIPASE, AMYLASE in the last 168 hours. No results for input(s): AMMONIA in the last 168 hours. CBC: No results for input(s): WBC, NEUTROABS, HGB, HCT, MCV, PLT in the last 168 hours. Cardiac Enzymes: No results for input(s): CKTOTAL, CKMB, CKMBINDEX, TROPONINI in the last 168 hours. BNP: Invalid input(s): POCBNP CBG: No results for input(s): GLUCAP in the last 168 hours.  Radiological Exams on Admission:  DG Knee Complete 4 Views Right  Result Date: 09/21/2020 CLINICAL DATA:  87 year old female with pain, suspected recent fall. EXAM: RIGHT KNEE - COMPLETE 4+ VIEW COMPARISON:  Right hip series 04/07/2019. FINDINGS: Bilateral total knee arthroplasties are visible on these images. There is a long segment spiral fracture of the distal right femur extending at least 12 cm, with posterior displacement 1 full shaft width, over riding by 6 or 7 cm, and medial angulation. Comminution. There is evidence of some periosteal new bone formation about the fracture margins. The right knee hardware remains intact. The proximal femur fragment may be mildly impacted upon the right patella. Proximal right tibia and fibula appear intact. Calcified peripheral vascular disease. IMPRESSION: Subacute appearing long spiral fracture of the distal right femur extending from the shaft to the total knee hardware. One full shaft width posterior displacement with over riding of at least 6 cm, and medial angulation. Electronically Signed   By: Odessa Fleming M.D.   On: 09/21/2020 09:58      EKG: Not  done   A & P   Principal Problem:   Closed hip fracture, right, initial encounter (HCC) Active Problems:   CVA (cerebral vascular accident) (HCC)   COPD (chronic obstructive pulmonary disease) (HCC)   Alzheimer's dementia without behavioral disturbance (HCC)   CKD (chronic kidney disease) stage 3, GFR 30-59 ml/min (HCC)   Femur fracture (HCC)   1. Subacute spiral fracture of the right femur with lateral displacement a. Likely from trauma one week ago as noted in HPI b. Bed bound at baseline c. Orthopedic surgery consulted and plan is for surgery d. Hold home aspirin e. Pain management per hip/femur fracture order set   2. Advanced dementia a. Appears at/near baseline  3. CKD 3b a. Labs pending  4. Hypertension a. Continue PRN lopressor  5. Superficial right knee abrasion a. Local wound care  6. History of CVA in 2018 a. Holding Aspirin preoperatively  7. History of COPD a. Not in acute exacerbation    DVT prophylaxis: SCDs   Code Status: Full Code  Diet: NPO Family Communication: Admission, patients condition and plan of care including tests being ordered have been discussed with the patient who indicates understanding and agrees with the plan and Code Status. Patient's granddaughter was updated  Disposition Plan: The appropriate patient status for this patient is INPATIENT. Inpatient status is judged to be reasonable and necessary in order to provide the required intensity of service to ensure the patient's safety. The patient's presenting symptoms, physical exam findings, and initial radiographic and laboratory data in the context of their chronic comorbidities is felt to place them at high risk for further clinical deterioration. Furthermore, it is not anticipated that the patient will be medically stable for discharge from the hospital within 2 midnights of admission. The following factors support the patient status of inpatient.   " The patient's presenting  symptoms include right leg swelling. " The worrisome physical exam findings include right leg discomfort with palpation. " The initial radiographic and laboratory  data are worrisome because of right femur fracture. " The chronic co-morbidities include dementia, bed bound.   * I certify that at the point of admission it is my clinical judgment that the patient will require inpatient hospital care spanning beyond 2 midnights from the point of admission due to high intensity of service, high risk for further deterioration and high frequency of surveillance required.*    Consultants  . ortho  Procedures  . none  Time Spent on Admission: 60 minutes    Jae Dire, DO [facility]  09/21/2020, 3:10 PM

## 2020-09-21 NOTE — Discharge Instructions (Signed)
Right open distal femur fracture site, wound incised and drained and debrided. Will start IV antibiotics then change to oral antibiotics as cultures and sensitivities become available.  She is not able to ambulate and has not in over 18 months. Important to prevent decubitii over the heels and sacrum with  Frequent turning and keeping heels off the bed.

## 2020-09-21 NOTE — Op Note (Signed)
09/21/2020  6:22 PM  PATIENT:  Roberta Griffin  87 y.o. female  MRN: [id number]  OPERATIVE REPORT  PRE-OPERATIVE DIAGNOSIS:  OPEN RIGHT FEMUR FRACTURE  POST-OPERATIVE DIAGNOSIS:  OPEN RIGHT FEMUR FRACTURE  PROCEDURE:  Procedure(s): INCISION AND DRAINAGE IRRIGATION AND DEBRIDEMENT OF OPEN RIGHT DISTAL SUPRACONDYLAR FEMUR FRACTURE    SURGEON:  Jessy Oto, MD   ANESTHESIA:  Garfield Cornea.    COMPLICATIONS:  None.   SPECIMEN: Swab of the open wound right anterolateral superior parapatella sight of grade 1 open supracondylar femur fracture Sent for culture and sensitivies anaerobic and aerobic.     COMPONENTS: None.  PROCEDURE:The patient was met in the holding area, and the appropriate right knee identified and marked with an "X" and my initials.  The patient was then transported to OR and was placed on the operative table in a supine position. The patient was then placed under general anesthesia without difficulty. The patient received no preoperative antibiotic prophylaxis. Tourniquet was applied to the operative right thigh. Leg was then prepped using sterile conditions and draped using sterile technique. Time-out procedure was called and correct. The landmarks of the left lower extremity would then marked with a surgical marking pen. The right parapatella open wound was elipsed with a #10 blade scalpel and the incision extended proximally and distally to expose the underlying anterolateral knee retinaculum and the lateral patella. Weitlander placed and the anterior wound cultured for anaerobic and aerobic culture and sensitivities. The wound explored and the right lateral distal supracondylar femur fracture spike was noted along the right parapatella area and superficial to the right total knee replacement synovioum. The area exposed and the distal portions of the femur was then debrided using a [place]. The area then irrigated with Normal saline about 200 cc. No antibiotic  irrigation was available. Following this then the superior and inferior incision was closed with vertical mattress sutures of 2-0 prolene. The open wound then packed with 1/4inch iodophor packing then dried dressing of 4 x 4's ABDs pads fixed to the skin with sterile webril and then ace wrap applied right knee. All instrument and sponge counts were correct. A knee immobilizer placed. Note that on examining the right femur there was no gross Movement of the distal supacondylar femur site and a knee immobilizer with the posterior stays removed applied. Patient then reactivated extubated and returned to recovery room in satisfactory condition  Benjiman Core, PA-C assisted in the operative case performing careful positioning of the patient at the beginning of the case maintaining position alignment of the leg throughout the case for adequate exposure both the medial and lateral joint line.He performed closure of the incision site at the end of the case.   Basil Dess 09/21/2020, 6:22 PM

## 2020-09-21 NOTE — Consult Note (Addendum)
Reason for Consult: Right femur fracture Referring Physician: Bethann Berkshire, MD Consulting Physician:Yaretsi Humphres Otelia Sergeant MD.  Orthopedic Diagnosis:Open grade 1 right distal supracondylar femur fracture above a right total knee replacement and belowRight hip hemiarthroplasty. Wound is at least one week old. Fracture is at least 43 weeks old.  Left ;hip hemiarthroplasty. Bilateral total knee replacements. Dementia, alzhiemers.    TIR:[id number] Roberta Griffin is an 87 y.o. female.Has history of dementia and previous left and right hip hemiarthroplasties and bilateral total knee replacement. She is seen in consultation in the ER transported to the ER by EMS today. She is a resident at the [facility] and was noted to have a painful deformed right distal thigh today. Xrays demonstrated a right distal  Supracondylar femur fracture with significant shortening and long oblique spiral fracture. The fracture is displaced laterally.There is callus at the fracture site.  Patient has dementia and is not able to detail the specifics of her history. Kris Mouton daughter Tori Milks [phone] is listed as the next of kin.  I spoke to granddaughter and she indicated that the open wound right knee began about one week ago and she was not told about possible bone injury of the right thigh.     Past Medical History:  Diagnosis Date  . COPD (chronic obstructive pulmonary disease) (HCC)   . Dementia (HCC)   . Depression   . Dysphagia   . Hypertension   . Scoliosis     Past Surgical History:  Procedure Laterality Date  . ABDOMINAL HYSTERECTOMY    . HIP ARTHROPLASTY Left 07/29/2018   Procedure: ARTHROPLASTY BIPOLAR HIP (HEMIARTHROPLASTY);  Surgeon: Valeria Batman, MD;  Location: [facility] OR;  Service: Orthopedics;  Laterality: Left;  . HIP ARTHROPLASTY Right 03/28/2019   Procedure: ARTHROPLASTY BIPOLAR HIP (HEMIARTHROPLASTY);  Surgeon: Nadara Mustard, MD;  Location: [facility] OR;  Service: Orthopedics;  Laterality: Right;  . KNEE  SURGERY    . SHOULDER SURGERY    . VITRECTOMY AND CATARACT      Family History  Problem Relation Age of Onset  . Stroke Neg Hx     Social History:  reports that she has never smoked. She has never used smokeless tobacco. She reports that she does not drink alcohol and does not use drugs.  Allergies:  Allergies  Allergen Reactions  . Tramadol Other (See Comments)    twitching    Medications:  Prior to Admission: (Not in a hospital admission)  Scheduled: . metoprolol tartrate  5 mg Intravenous Once   Continuous:  PRN: Anti-infectives (From admission, onward)   None      No results found for this or any previous visit (from the past 48 hour(s)).  DG Knee Complete 4 Views Right  Result Date: 09/21/2020 CLINICAL DATA:  87 year old female with pain, suspected recent fall. EXAM: RIGHT KNEE - COMPLETE 4+ VIEW COMPARISON:  Right hip series 04/07/2019. FINDINGS: Bilateral total knee arthroplasties are visible on these images. There is a long segment spiral fracture of the distal right femur extending at least 12 cm, with posterior displacement 1 full shaft width, over riding by 6 or 7 cm, and medial angulation. Comminution. There is evidence of some periosteal new bone formation about the fracture margins. The right knee hardware remains intact. The proximal femur fragment may be mildly impacted upon the right patella. Proximal right tibia and fibula appear intact. Calcified peripheral vascular disease. IMPRESSION: Subacute appearing long spiral fracture of the distal right femur extending from the shaft to the  total knee hardware. One full shaft width posterior displacement with over riding of at least 6 cm, and medial angulation. Electronically Signed   By: Odessa Fleming M.D.   On: 09/21/2020 09:58    ROS Blood pressure (!) 169/145, pulse 78, temperature 98 F (36.7 C), temperature source Rectal, resp. rate (!) 21, height 5\' 5"  (1.651 m), weight 49 kg, SpO2 90 %. Physical Exam Thin  emaciated female lying in hospital stretcher with left leg crossed over the right at the knee. The right Knee shows an open wound with bloody drainage right anterior superior knee just at the superior lateral margin of the distal femoral implant.. The distal supracondylar femur fracture is deep to this area and bayonetted so that it is likely that this represents an open right grade 1 distal supracondylar femur fracture. She is awake and speaking incoherently. Knows Dr. name.  Orthopaedic Exam: Thin emaciated female lying in hospital stretcher with left leg crossed over the right at the knee. The right Knee shows an open wound with bloody drainage right anterior superior knee just at the superior lateral margin of the distal femoral implant.. The distal supracondylar femur fracture is deep to this area and bayonetted so that it is likely that this represents an open right grade 1 distal supracondylar femur fracture.  Attempted movement of the right distal femur fracture site suggests that the fracture is stiff and not mobile.   Assessment/Plan: Right open grade 1 supracondylar femur fracture.  Dementia COPD CVA history HTN CKD stage 3 Plan due to open wound of uncertain age, I will plan to perform Incision, drainage and debridement of the right distal open  Fracture site and evaluation of the wound to determine if the total knee replacement is open to the open fracture site or involved. . Likely will need some external support or knee immobilizer post op to decrease tendency to displace. , brace or  Audrie Lia 09/21/2020, 2:15 PM

## 2020-09-21 NOTE — Anesthesia Procedure Notes (Signed)
Procedure Name: Intubation Performed by: Bluford Sedler H, CRNA Pre-anesthesia Checklist: Patient identified, Emergency Drugs available, Suction available and Patient being monitored Patient Re-evaluated:Patient Re-evaluated prior to induction Oxygen Delivery Method: Circle System Utilized Preoxygenation: Pre-oxygenation with 100% oxygen Induction Type: IV induction Ventilation: Mask ventilation without difficulty Laryngoscope Size: Miller and 2 Grade View: Grade I Tube type: Oral Tube size: 7.0 mm Number of attempts: 1 Airway Equipment and Method: Stylet and Oral airway Placement Confirmation: ETT inserted through vocal cords under direct vision,  positive ETCO2 and breath sounds checked- equal and bilateral Secured at: 20 cm Tube secured with: Tape Dental Injury: Teeth and Oropharynx as per pre-operative assessment        

## 2020-09-21 NOTE — Anesthesia Preprocedure Evaluation (Addendum)
Anesthesia Evaluation  Patient identified by MRN, date of birth, ID band Patient confused    Reviewed: Allergy & Precautions, NPO status , Patient's Chart, lab work & pertinent test results, reviewed documented beta blocker date and time   Airway Mallampati: II  TM Distance: >3 FB Neck ROM: Full    Dental  (+) Edentulous Upper, Edentulous Lower, Dental Advisory Given   Pulmonary COPD,    Pulmonary exam normal breath sounds clear to auscultation       Cardiovascular hypertension, Pt. on medications and Pt. on home beta blockers +CHF  Normal cardiovascular exam Rhythm:Regular Rate:Normal     Neuro/Psych PSYCHIATRIC DISORDERS Anxiety Depression Schizophrenia Dementia CVA    GI/Hepatic negative GI ROS, Neg liver ROS,   Endo/Other  negative endocrine ROS  Renal/GU Renal disease     Musculoskeletal negative musculoskeletal ROS (+)   Abdominal   Peds  Hematology  (+) Blood dyscrasia, anemia ,   Anesthesia Other Findings   Reproductive/Obstetrics                            Anesthesia Physical Anesthesia Plan  ASA: IV  Anesthesia Plan: General   Post-op Pain Management:    Induction: Intravenous  PONV Risk Score and Plan: 4 or greater and Ondansetron, Treatment may vary due to age or medical condition and Dexamethasone  Airway Management Planned: Oral ETT  Additional Equipment: None  Intra-op Plan:   Post-operative Plan: Possible Post-op intubation/ventilation  Informed Consent: I have reviewed the patients History and Physical, chart, labs and discussed the procedure including the risks, benefits and alternatives for the proposed anesthesia with the patient or authorized representative who has indicated his/her understanding and acceptance.     Dental advisory given and Consent reviewed with POA  Plan Discussed with: CRNA  Anesthesia Plan Comments:        Anesthesia Quick  Evaluation

## 2020-09-21 NOTE — Transfer of Care (Signed)
Immediate Anesthesia Transfer of Care Note  Patient: Kortnie Stovall  Procedure(s) Performed: INCISION AND DRAINAGE IRRIGATION AND DEBRIDEMENT OF OPEN RIGHT DISTAL SUPRACONDYLAR FEMUR FRACTURE (Right )  Patient Location: PACU  Anesthesia Type:General  Level of Consciousness: drowsy  Airway & Oxygen Therapy: Patient Spontanous Breathing and Patient connected to face mask oxygen  Post-op Assessment: Report given to RN and Post -op Vital signs reviewed and stable  Post vital signs: Reviewed and stable  Last Vitals:  Vitals Value Taken Time  BP 173/85 09/21/20 1805  Temp    Pulse 62 09/21/20 1809  Resp 11 09/21/20 1809  SpO2 100 % 09/21/20 1809  Vitals shown include unvalidated device data.  Last Pain:  Vitals:   09/21/20 1608  TempSrc: Axillary  PainSc: 10-Worst pain ever         Complications: No complications documented.

## 2020-09-21 NOTE — ED Triage Notes (Addendum)
Arrives via EMS from [facility], EMS was called out for possible femur fx (R side), X-ray was done yesterday, copy of radiology report was not at facility, MD called nursing staff and requested pt be sent to ED. Patient endorses pain with L arm movement. Patient is bed bound, facility denies recent falls, but they are unsure. Per facility, baseline mental status, hx of dementia.

## 2020-09-21 NOTE — ED Notes (Signed)
Weeping bandage to R knee, bandage removed. Bloody, purulent drainage noted.

## 2020-09-21 NOTE — ED Provider Notes (Signed)
[facility]-EMERGENCY DEPT Provider Note   CSN: [id number] Arrival date & time: 09/21/20  0808     History Chief Complaint  Patient presents with  . abnormal x-ray     Roberta Griffin is a 87 y.o. female.  Patient states that her nursing home.  Patient is demented.  She is bedbound and it was noted she had some swelling in her right knee.  No history of trauma  The history is provided by the patient and medical records. No language interpreter was used.  Leg Pain Location:  Leg Injury: no   Leg location:  R leg Pain details:    Quality:  Aching   Radiates to:  Does not radiate   Severity:  Mild   Onset quality:  Unable to specify   Timing:  Constant Chronicity:  New Dislocation: no   Tetanus status:  Unknown Relieved by:  Nothing Worsened by:  Nothing      Past Medical History:  Diagnosis Date  . COPD (chronic obstructive pulmonary disease) (HCC)   . Dementia (HCC)   . Depression   . Dysphagia   . Hypertension   . Scoliosis     Patient Active Problem List   Diagnosis Date Noted  . Femur fracture (HCC) 09/21/2020  . Protein-calorie malnutrition, severe 03/31/2019  . History of pulmonary embolus (PE) 03/28/2019  . Hip fracture (HCC) 03/28/2019  . Fall 10/12/2018  . Hypothermia 10/11/2018  . Subcapital fracture of left hip (HCC) 07/29/2018  . Closed hip fracture, right, initial encounter (HCC) 07/26/2018  . Depression with anxiety 07/26/2017  . High risk medication use 06/03/2017  . Chronic pain of both shoulders 06/03/2017  . Schizophrenia (HCC) 04/26/2017  . GAD (generalized anxiety disorder) 04/05/2017  . History of stroke 12/05/2016  . Pulmonary embolus (HCC) 12/05/2016  . Chronic diastolic CHF (congestive heart failure) (HCC) 03/04/2016  . Insomnia 03/04/2016  . Dysarthria 03/04/2016  . Thyroid nodule 03/04/2016  . Pancreatic mass   . HLD (hyperlipidemia)   . Dysphagia 02/18/2016  . CVA (cerebral vascular accident) (HCC)  02/17/2016  . COPD (chronic obstructive pulmonary disease) (HCC) 02/17/2016  . Alzheimer's dementia without behavioral disturbance (HCC) 02/17/2016  . Essential hypertension 02/17/2016  . Normocytic anemia 02/17/2016  . CKD (chronic kidney disease) stage 3, GFR 30-59 ml/min (HCC) 02/17/2016    Past Surgical History:  Procedure Laterality Date  . ABDOMINAL HYSTERECTOMY    . HIP ARTHROPLASTY Left 07/29/2018   Procedure: ARTHROPLASTY BIPOLAR HIP (HEMIARTHROPLASTY);  Surgeon: Valeria Batman, MD;  Location: [facility] OR;  Service: Orthopedics;  Laterality: Left;  . HIP ARTHROPLASTY Right 03/28/2019   Procedure: ARTHROPLASTY BIPOLAR HIP (HEMIARTHROPLASTY);  Surgeon: Nadara Mustard, MD;  Location: [facility] OR;  Service: Orthopedics;  Laterality: Right;  . KNEE SURGERY    . SHOULDER SURGERY    . VITRECTOMY AND CATARACT       OB History   No obstetric history on file.     Family History  Problem Relation Age of Onset  . Stroke Neg Hx     Social History   Tobacco Use  . Smoking status: Never Smoker  . Smokeless tobacco: Never Used  Substance Use Topics  . Alcohol use: No  . Drug use: No    Home Medications Prior to Admission medications   Medication Sig Start Date End Date Taking? Authorizing Provider  acetaminophen (TYLENOL) 500 MG tablet Take 1 tablet (500 mg total) by mouth every 6 (six) hours as needed for mild pain.  Patient taking differently: Take 1,000 mg by mouth 3 (three) times daily. 03/28/19  Yes Nadara Mustard, MD  aspirin 81 MG chewable tablet Chew 1 tablet (81 mg total) by mouth daily. 04/02/19  Yes Roberto Scales D, MD  bisacodyl (DULCOLAX) 5 MG EC tablet Take 1 tablet (5 mg total) by mouth daily as needed for moderate constipation. 04/01/19  Yes Roberto Scales D, MD  docusate sodium (COLACE) 100 MG capsule Take 1 capsule (100 mg total) by mouth 2 (two) times daily. 07/31/18  Yes Burnadette Pop, MD  Ensure (ENSURE) Take 237 mLs by mouth 2 (two) times daily between meals.    Yes [provider]  Melatonin 3 MG TABS Take 3 mg by mouth at bedtime.  05/18/18  Yes [provider]  metoprolol tartrate (LOPRESSOR) 25 MG tablet Take 0.5 tablets (12.5 mg total) by mouth 2 (two) times daily. 04/01/19  Yes Roberto Scales D, MD  Multiple Vitamin (MULTIVITAMIN WITH MINERALS) TABS tablet Take 1 tablet by mouth daily.   Yes [provider]  omeprazole (PRILOSEC) 20 MG capsule Take 20 mg by mouth daily.   Yes [provider]  polyethylene glycol (MIRALAX) packet Take 17 g by mouth daily as needed for moderate constipation. Patient taking differently: Take 17 g by mouth See admin instructions. Daily and as needed for constipation 07/31/18  Yes Adhikari, Amrit, MD  PROTEIN PO Take 30 mLs by mouth daily.   Yes [provider]  senna (SENOKOT) 8.6 MG TABS tablet Take 2 tablets by mouth at bedtime.   Yes [provider]  sucralfate (CARAFATE) 1 g tablet Take 1 g by mouth 4 (four) times daily -  with meals and at bedtime.   Yes [provider]  vitamin C (ASCORBIC ACID) 500 MG tablet Take 500 mg by mouth daily.   Yes [provider]  zinc sulfate 220 (50 Zn) MG capsule Take 220 mg by mouth daily.   Yes [provider]    Allergies    Tramadol  Review of Systems   Review of Systems  Unable to perform ROS: Mental status change    Physical Exam Updated Vital Signs BP (!) 151/121   Pulse 70   Temp 98 F (36.7 C) (Rectal)   Resp 16   Ht 5\' 5"  (1.651 m)   Wt 49 kg   SpO2 98%   BMI 17.98 kg/m   Physical Exam Vitals and nursing note reviewed.  Constitutional:      Appearance: She is well-developed.  HENT:     Head: Normocephalic.     Nose: Nose normal.  Eyes:     General: No scleral icterus.    Extraocular Movements: EOM normal.     Conjunctiva/sclera: Conjunctivae normal.  Neck:     Thyroid: No thyromegaly.  Cardiovascular:     Rate and Rhythm: Normal rate and regular rhythm.     Heart  sounds: No murmur heard. No friction rub. No gallop.   Pulmonary:     Breath sounds: No stridor. No wheezing or rales.  Chest:     Chest wall: No tenderness.  Abdominal:     General: There is no distension.     Tenderness: There is no abdominal tenderness. There is no rebound.  Musculoskeletal:        General: No edema.     Cervical back: Neck supple.     Comments: Contractures in both lower extremities with swelling to right knee  Lymphadenopathy:  Cervical: No cervical adenopathy.  Skin:    Findings: No erythema or rash.  Neurological:     Mental Status: She is alert.     Motor: No abnormal muscle tone.     Coordination: Coordination normal.     Comments: Oriented to person only  Psychiatric:        Mood and Affect: Mood and affect normal.        Behavior: Behavior normal.     ED Results / Procedures / Treatments   Labs (all labs ordered are listed, but only abnormal results are displayed) Labs Reviewed  RESP PANEL BY RT-PCR (FLU A&B, COVID) ARPGX2  CBC WITH DIFFERENTIAL/PLATELET  COMPREHENSIVE METABOLIC PANEL    EKG None  Radiology DG Knee Complete 4 Views Right  Result Date: 09/21/2020 CLINICAL DATA:  87 year old female with pain, suspected recent fall. EXAM: RIGHT KNEE - COMPLETE 4+ VIEW COMPARISON:  Right hip series 04/07/2019. FINDINGS: Bilateral total knee arthroplasties are visible on these images. There is a long segment spiral fracture of the distal right femur extending at least 12 cm, with posterior displacement 1 full shaft width, over riding by 6 or 7 cm, and medial angulation. Comminution. There is evidence of some periosteal new bone formation about the fracture margins. The right knee hardware remains intact. The proximal femur fragment may be mildly impacted upon the right patella. Proximal right tibia and fibula appear intact. Calcified peripheral vascular disease. IMPRESSION: Subacute appearing long spiral fracture of the distal right femur extending  from the shaft to the total knee hardware. One full shaft width posterior displacement with over riding of at least 6 cm, and medial angulation. Electronically Signed   By: Odessa Fleming M.D.   On: 09/21/2020 09:58    Procedures Procedures   Medications Ordered in ED Medications  metoprolol tartrate (LOPRESSOR) injection 5 mg (has no administration in time range)    ED Course  I have reviewed the triage vital signs and the nursing notes.  Pertinent labs & imaging results that were available during my care of the patient were reviewed by me and considered in my medical decision making (see chart for details).    MDM Rules/Calculators/A&P                          Patient with a fractured distal femur.  Possibly open fracture.  Patient is seen by orthopedics who will take the patient to surgery to repair the fracture.  She will be admitted to medicine Final Clinical Impression(s) / ED Diagnoses Final diagnoses:  Closed fracture of neck of right femur, initial encounter [facility])    Rx / DC Orders ED Discharge Orders    None       Bethann Berkshire, MD 09/21/20 1440

## 2020-09-21 NOTE — Brief Op Note (Signed)
09/21/2020  6:11 PM  PATIENT:  Roberta Griffin  87 y.o. female  PRE-OPERATIVE DIAGNOSIS:  OPEN RIGHT FEMUR FRACTURE  POST-OPERATIVE DIAGNOSIS:  OPEN RIGHT FEMUR FRACTURE  PROCEDURE:  Procedure(s): INCISION AND DRAINAGE IRRIGATION AND DEBRIDEMENT OF OPEN RIGHT DISTAL SUPRACONDYLAR FEMUR FRACTURE (Right)  SURGEON:  Surgeon(s) and Role:    Kerrin Champagne, MD - Primary  ANESTHESIA:   general, Dr. Renold Don  EBL:  50 mL   BLOOD ADMINISTERED:none  DRAINS: wound packed open with iodophor 1/4 inch packing into the prepatella bursa.   LOCAL MEDICATIONS USED:  NONE  SPECIMEN:  Source of Specimen:  right anterior knee open supracondyar femur fracture site and prepatella bursa swab for culture and sensitivities.   DISPOSITION OF SPECIMEN:  microbiology  COUNTS:  YES  TOURNIQUET:  * No tourniquets in log *  DICTATION: .Dragon Dictation  PLAN OF CARE: Admit to inpatient   PATIENT DISPOSITION:  PACU - hemodynamically stable.   Delay start of Pharmacological VTE agent (>24hrs) due to surgical blood loss or risk of bleeding: no

## 2020-09-21 NOTE — ED Notes (Signed)
Report given to Selena Batten, Charity fundraiser. Engineer, manufacturing systems called for transport.

## 2020-09-22 DIAGNOSIS — S72451R Displaced supracondylar fracture without intracondylar extension of lower end of right femur, subsequent encounter for open fracture type IIIA, IIIB, or IIIC with malunion: Secondary | ICD-10-CM | POA: Diagnosis not present

## 2020-09-22 LAB — CBC
HCT: 38 % (ref 36.0–46.0)
Hemoglobin: 12.3 g/dL (ref 12.0–15.0)
MCH: 28.1 pg (ref 26.0–34.0)
MCHC: 32.4 g/dL (ref 30.0–36.0)
MCV: 87 fL (ref 80.0–100.0)
Platelets: 277 10*3/uL (ref 150–400)
RBC: 4.37 MIL/uL (ref 3.87–5.11)
RDW: 13.9 % (ref 11.5–15.5)
WBC: 8.3 10*3/uL (ref 4.0–10.5)
nRBC: 0 % (ref 0.0–0.2)

## 2020-09-22 LAB — BASIC METABOLIC PANEL
Anion gap: 11 (ref 5–15)
BUN: 19 mg/dL (ref 8–23)
CO2: 23 mmol/L (ref 22–32)
Calcium: 9.4 mg/dL (ref 8.9–10.3)
Chloride: 99 mmol/L (ref 98–111)
Creatinine, Ser: 0.89 mg/dL (ref 0.44–1.00)
GFR, Estimated: >60 mL/min (ref >60)
Glucose, Bld: 170 mg/dL — ABNORMAL HIGH (ref 70–99)
Potassium: 4.8 mmol/L (ref 3.5–5.1)
Sodium: 133 mmol/L — ABNORMAL LOW (ref 135–145)

## 2020-09-22 LAB — C-REACTIVE PROTEIN: CRP: 3.9 mg/dL — ABNORMAL HIGH (ref <1.0)

## 2020-09-22 LAB — SEDIMENTATION RATE: Sed Rate: 25 mm/hr — ABNORMAL HIGH (ref 0–22)

## 2020-09-22 MED ORDER — ENSURE ENLIVE PO LIQD
237.0000 mL | ORAL | Status: DC
  Administered 2020-09-22 – 2020-09-24 (×3): 237 mL via ORAL

## 2020-09-22 MED ORDER — SENNA 8.6 MG PO TABS
2.0000 | ORAL_TABLET | Freq: Every day | ORAL | Status: DC
  Administered 2020-09-23 – 2020-09-25 (×3): 17.2 mg via ORAL
  Filled 2020-09-22 (×3): qty 2

## 2020-09-22 MED ORDER — ADULT MULTIVITAMIN W/MINERALS CH
1.0000 | ORAL_TABLET | Freq: Every day | ORAL | Status: DC
  Administered 2020-09-22 – 2020-09-26 (×5): 1 via ORAL
  Filled 2020-09-22 (×5): qty 1

## 2020-09-22 MED ORDER — PANTOPRAZOLE SODIUM 40 MG PO TBEC: 40.0000 mg | DELAYED_RELEASE_TABLET | Freq: Every day | ORAL | Status: DC

## 2020-09-22 MED ORDER — DOCUSATE SODIUM 100 MG PO CAPS: 100.0000 mg | ORAL_CAPSULE | Freq: Two times a day (BID) | ORAL | Status: DC

## 2020-09-22 MED ORDER — ASPIRIN 81 MG PO CHEW
81.0000 mg | CHEWABLE_TABLET | Freq: Every day | ORAL | Status: DC
  Administered 2020-09-22 – 2020-09-26 (×5): 81 mg via ORAL
  Filled 2020-09-22 (×5): qty 1

## 2020-09-22 MED ORDER — BOOST / RESOURCE BREEZE PO LIQD CUSTOM
1.0000 | ORAL | Status: DC
  Administered 2020-09-23 – 2020-09-26 (×3): 1 via ORAL

## 2020-09-22 MED ORDER — SUCRALFATE 1 G PO TABS
1.0000 g | ORAL_TABLET | Freq: Three times a day (TID) | ORAL | Status: DC
  Administered 2020-09-22 – 2020-09-26 (×14): 1 g via ORAL
  Filled 2020-09-22 (×15): qty 1

## 2020-09-22 MED ORDER — METOPROLOL TARTRATE 12.5 MG HALF TABLET
12.5000 mg | ORAL_TABLET | Freq: Two times a day (BID) | ORAL | Status: DC
  Administered 2020-09-22 – 2020-09-26 (×9): 12.5 mg via ORAL
  Filled 2020-09-22 (×9): qty 1

## 2020-09-22 NOTE — Progress Notes (Signed)
     Subjective: 1 Day Post-Op Procedure(s) (LRB): INCISION AND DRAINAGE IRRIGATION AND DEBRIDEMENT OF OPEN RIGHT DISTAL SUPRACONDYLAR FEMUR FRACTURE (Right) Awake, alert and oriented x1. Granddaughter at bedside helping to feed her, needs mechanical soft diet.  Cultures from OR are pending, on Day #2 of IV Ancef.  Patient reports pain as mild.    Objective:   VITALS:  Temp:  [97.7 F (36.5 C)-98.5 F (36.9 C)] 98.5 F (36.9 C) (02/10 1349) Pulse Rate:  [60-74] 74 (02/10 1349) Resp:  [12-19] 16 (02/10 1349) BP: (112-173)/(67-112) 112/81 (02/10 1349) SpO2:  [92 %-100 %] 97 % (02/10 1349)  Neurologically intact ABD soft Neurovascular intact Sensation intact distally Intact pulses distally Dorsiflexion/Plantar flexion intact Incision: dressing C/D/I No cellulitis present Compartment soft   LABS Recent Labs    09/21/20 1414 09/22/20 0308  HGB 12.6 12.3  WBC 9.2 8.3  PLT 291 277   Recent Labs    09/21/20 1414 09/22/20 0308  NA 136 133*  K 4.5 4.8  CL 100 99  CO2 28 23  BUN 14 19  CREATININE 0.97 0.89  GLUCOSE 91 170*   No results for input(s): LABPT, INR in the last 72 hours.   Assessment/Plan: 1 Day Post-Op Procedure(s) (LRB): INCISION AND DRAINAGE IRRIGATION AND DEBRIDEMENT OF OPEN RIGHT DISTAL SUPRACONDYLAR FEMUR FRACTURE (Right)  Advance diet change to mechanical soft.  Up with therapy Continue ABX therapy due to Culture taken of surgical site during surgical debridement of open grade 1 distal  Femur fracture, age indeterminant, likely one week old. Will change dressing tomorrow and await results of culture and sensitivity to determine if can switch to an oral antibiotic. Lab sed rate 25 and CRP 3.5 suggest may be low grade localized infection.   Vira Browns 09/22/2020, 6:19 PMPatient ID: Roberta Griffin, female   DOB: [birth year], 87 y.o.   MRN: [id number]

## 2020-09-22 NOTE — Progress Notes (Signed)
Initial Nutrition Assessment  DOCUMENTATION CODES:   Underweight  INTERVENTION:  - will order Boost Breeze once/day, each supplement provides 250 kcal and 9 grams of protein. - will order Ensure Enlive once/day, each supplement provides 350 kcal and 20 grams of protein. - will order Magic Cup BID with meals, each supplement provides 290 kcal and 9 grams of protein. - will order 1 tablet multivitamin with minerals/day. - complete NFPE at follow-up.   NUTRITION DIAGNOSIS:   Increased nutrient needs related to acute illness as evidenced by estimated needs.  GOAL:   Patient will meet greater than or equal to 90% of their needs  MONITOR:   PO intake,Supplement acceptance,Labs,Weight trends  REASON FOR ASSESSMENT:   Consult Hip fracture protocol  ASSESSMENT:   87 year old female with medical history of COPD, dementia, stage 3 CKD, HTN, CVA, and bilateral hip arthroplasty. She presented to the ED via EMS from SNF d/t R knee swelling and pain. R knee xray showed long spiral fx of distal R femur. She is s/p I&D of open R distal supracondylar femur fx.  No intakes documented since admission. Patient noted to be a/o to self only. She was sleeping at the time of visit with no family or visitors present. Lunch tray was on bedside table and untouched. RN was off the floor at the time of RD visit.   Weight yesterday was 108 lb and appears to have been copied forward from 03/28/19. No information documented in the edema section of flow sheet.   Per notes: - R femur fx - hx of advanced dementia   Labs reviewed; Na: 133 mmol/l. Medications reviewed; 100 mg colace BID, 40 mg oral protonix/day, 2 tablet senokot/day, 1 g carafate TID.     NUTRITION - FOCUSED PHYSICAL EXAM:  unable to complete at this time.   Diet Order:   Diet Order            DIET SOFT Room service appropriate? No; Fluid consistency: Thin  Diet effective now                 EDUCATION NEEDS:   Not appropriate  for education at this time  Skin:  Skin Assessment: Reviewed RN Assessment  Last BM:  PTA/unknown  Height:   Ht Readings from Last 1 Encounters:  09/21/20 5\' 5"  (1.651 m)    Weight:   Wt Readings from Last 1 Encounters:  09/21/20 49 kg    Estimated Nutritional Needs:  Kcal:  1225-1420 kcal Protein:  55-70 grams Fluid:  >/= 1.5 L/day      11/19/20, MS, RD, LDN, CNSC Inpatient Clinical Dietitian RD pager # available in [facility]  After hours/weekend pager # available in [facility]

## 2020-09-22 NOTE — Evaluation (Signed)
Physical Therapy Evaluation Patient Details Name: Roberta Griffin MRN: [id number] DOB: 1925/08/25 Today's Date: 09/22/2020   History of Present Illness  Roberta Griffin is a 87 year old female with past medical history of COPD, dementia, CKD 3b, hypertension, CVA, bilateral hip arthroplasty who presented to the ED via EMS from her SNF for right knee swelling and pain.  Right knee Xray revealed subacute long spiral fracture of the distal right femur extending form the shaft to the total knee hardware.  She is now s/p I&D of grade 1 open supracondylar femur fx.  Clinical Impression  Patient in supine and in obvious distress.  Unable to follow commands at this time with distraction by pain and limited activity tolerance.  Patient from SNF and feel best to serve with PT in her normal setting with improved pain control.  PT will not follow acutely, but do recommend skilled PT in the SNF setting.     Follow Up Recommendations SNF    Equipment Recommendations  None recommended by PT    Recommendations for Other Services       Precautions / Restrictions Precautions Precautions: Fall;Posterior Hip Required Braces or Orthoses: Knee Immobilizer - Right Restrictions Weight Bearing Restrictions: Yes RLE Weight Bearing: Non weight bearing      Mobility  Bed Mobility Overal bed mobility: Needs Assistance Bed Mobility: Rolling Rolling: Max assist;+2 for physical assistance         General bed mobility comments: rolled in bed for hygiene as soiled and then scooted up to [facility], pt not following commands for squeezing PT's hand so unable to participate much in mobility at this time so limited to in bed    Transfers                    Ambulation/Gait                Stairs            Wheelchair Mobility    Modified Rankin (Stroke Patients Only)       Balance Overall balance assessment: History of Falls                                            Pertinent Vitals/Pain Pain Assessment: Faces Faces Pain Scale: Hurts worst Pain Location: patient initially crying, wrything Pain Descriptors / Indicators: Discomfort Pain Intervention(s): Monitored during session;Limited activity within patient's tolerance;Repositioned    Home Living Family/patient expects to be discharged to:: Skilled nursing facility                      Prior Function                 Hand Dominance        Extremity/Trunk Assessment   Upper Extremity Assessment Upper Extremity Assessment: Difficult to assess due to impaired cognition    Lower Extremity Assessment Lower Extremity Assessment: Difficult to assess due to impaired cognition    Cervical / Trunk Assessment Cervical / Trunk Assessment: Kyphotic  Communication   Communication: Expressive difficulties  Cognition Arousal/Alertness: Awake/alert Behavior During Therapy: Anxious Overall Cognitive Status: No family/caregiver present to determine baseline cognitive functioning  General Comments General comments (skin integrity, edema, etc.): Patient wearing knee immobilizer on R leg; readjusted for preventing skin breakdown.  repositioned, cleansed and applied new purewick; pt calmer after repositioning    Exercises     Assessment/Plan    PT Assessment All further PT needs can be met in the next venue of care  PT Problem List         PT Treatment Interventions      PT Goals (Current goals can be found in the Care Plan section)  Acute Rehab PT Goals PT Goal Formulation: All assessment and education complete, DC therapy    Frequency     Barriers to discharge        Co-evaluation               AM-PAC PT "6 Clicks" Mobility  Outcome Measure Help needed turning from your back to your side while in a flat bed without using bedrails?: Total Help needed moving from lying on your back to sitting on the side  of a flat bed without using bedrails?: Total Help needed moving to and from a bed to a chair (including a wheelchair)?: Total Help needed standing up from a chair using your arms (e.g., wheelchair or bedside chair)?: Total Help needed to walk in hospital room?: Total Help needed climbing 3-5 steps with a railing? : Total 6 Click Score: 6    End of Session   Activity Tolerance: Patient limited by pain Patient left: in bed   PT Visit Diagnosis: History of falling (Z91.81);Pain Pain - Right/Left: Right Pain - part of body: Leg    Time: 9030-0923 PT Time Calculation (min) (ACUTE ONLY): 19 min   Charges:   PT Evaluation $PT Eval Moderate Complexity: 1 Mod          Magda Kiel, PT Acute Rehabilitation Services RAQTM:[phone] Office:[phone] 09/22/2020   Reginia Naas 09/22/2020, 5:33 PM

## 2020-09-22 NOTE — Anesthesia Postprocedure Evaluation (Signed)
Anesthesia Post Note  Patient: Roberta Griffin  Procedure(s) Performed: INCISION AND DRAINAGE IRRIGATION AND DEBRIDEMENT OF OPEN RIGHT DISTAL SUPRACONDYLAR FEMUR FRACTURE (Right )     Patient location during evaluation: PACU Anesthesia Type: General Level of consciousness: sedated and confused Pain management: pain level controlled Vital Signs Assessment: post-procedure vital signs reviewed and stable Respiratory status: spontaneous breathing Cardiovascular status: stable Anesthetic complications: no   No complications documented.  Last Vitals:  Vitals:   09/22/20 1101 09/22/20 1349  BP: 123/84 112/81  Pulse:  74  Resp: 19 16  Temp: 36.7 C 36.9 C  SpO2: 100% 97%    Last Pain:  Vitals:   09/22/20 1349  TempSrc: Axillary  PainSc:                  [name]

## 2020-09-22 NOTE — Plan of Care (Signed)
Care plan initiated.

## 2020-09-22 NOTE — Progress Notes (Signed)
PROGRESS NOTE    Roberta Griffin  EOF:[id number] DOB: 04/02/1926 DOA: 09/21/2020 PCP: Sherron Monday, MD     Brief Narrative:  Roberta Griffin is a 88 year old female with past medical history of COPD, dementia, CKD 3b, hypertension, CVA, bilateral hip arthroplasty who presented to the ED via EMS from her SNF for right knee swelling and pain for an unknown period of time. Per granddaughter over the phone, the staff at the facility were trying to move her at some point last week and she hit her leg and initially thought she only had a superficial abrasion. Right knee Xray revealed subacute long spiral fracture of the distal right femur extending form the shaft to the total knee hardware, one full shaft width posterior displacement with overriding of at least 6 cm and medial angulation. She underwent irrigation and debridement of open right distal supracondylar femur fracture.  New events last 24 hours / Subjective: She is resting in bed, arousable to voice but does not answer any questions in a meaningful manner.  Assessment & Plan:   Principal Problem:   Open displaced supracondylar fracture without intracondylar extension of lower end of right femur, type IIIA, IIIB, or IIIC, with malunion Active Problems:   CVA (cerebral vascular accident) (HCC)   COPD (chronic obstructive pulmonary disease) (HCC)   Alzheimer's dementia without behavioral disturbance (HCC)   CKD (chronic kidney disease) stage 3, GFR 30-59 ml/min (HCC)   Closed hip fracture, right, initial encounter (HCC)   Femur fracture (HCC)   Open femur fracture, right (HCC)   Subacute spiral fracture of the right femur -Status post irrigation and debridement of open supracondylar fracture -Patient is bedbound at baseline  Advanced dementia -Appears to be at or near baseline  CKD stage IIIa -Stable  Hypertension -Continue metoprolol  History of CVA -Continue aspirin  COPD -Without acute exacerbation    DVT  prophylaxis:  enoxaparin (LOVENOX) injection 30 mg Start: 09/22/20 0800 Place and maintain sequential compression device Start: 09/21/20 1455  Code Status: Full code Family Communication: None at bedside Disposition Plan:  Status is: Inpatient  Remains inpatient appropriate because:Unsafe d/c plan   Dispo: The patient is from: SNF              Anticipated d/c is to: SNF              Anticipated d/c date is: 1 day              Patient currently is medically stable to d/c.   Difficult to place patient No    Antimicrobials:  Anti-infectives (From admission, onward)   Start     Dose/Rate Route Frequency Ordered Stop   09/22/20 0200  ceFAZolin (ANCEF) IVPB 2g/100 mL premix        2 g 200 mL/hr over 30 Minutes Intravenous Every 8 hours 09/21/20 1950 09/23/20 0159   09/21/20 1745  ceFAZolin (ANCEF) IVPB 2g/100 mL premix        2 g 200 mL/hr over 30 Minutes Intravenous  Once 09/21/20 1650 09/21/20 1732   09/21/20 1652  ceFAZolin (ANCEF) 2-4 GM/100ML-% IVPB       Note to Pharmacy: Marney Doctor   : cabinet override      09/21/20 1652 09/21/20 1732        Objective: Vitals:   09/21/20 2235 09/22/20 0230 09/22/20 0554 09/22/20 1101  BP: 115/73 122/72 (!) 146/112 123/84  Pulse: 66 68 73   Resp: 16 16 15 19   Temp: 97.7 F (  36.5 C) 97.8 F (36.6 C) 97.7 F (36.5 C) 98 F (36.7 C)  TempSrc: Axillary Axillary Axillary Axillary  SpO2: 99% 95% 97% 100%  Weight:      Height:        Intake/Output Summary (Last 24 hours) at 09/22/2020 1126 Last data filed at 09/22/2020 0600 Gross per 24 hour  Intake 700 ml  Output 50 ml  Net 650 ml   Filed Weights   09/21/20 0818  Weight: 49 kg    Examination:  General exam: Appears calm and comfortable  Respiratory system: Clear to auscultation. Respiratory effort normal. No respiratory distress. No conversational dyspnea.  Cardiovascular system: S1 & S2 heard, RRR. No murmurs. No pedal edema. Gastrointestinal system: Abdomen is  nondistended, soft and nontender. Normal bowel sounds heard. Central nervous system: Alert to voice  Data Reviewed: I have personally reviewed following labs and imaging studies  CBC: Recent Labs  Lab 09/21/20 1414 09/22/20 0308  WBC 9.2 8.3  NEUTROABS 6.9  --   HGB 12.6 12.3  HCT 38.3 38.0  MCV 85.5 87.0  PLT 291 277   Basic Metabolic Panel: Recent Labs  Lab 09/21/20 1414 09/22/20 0308  NA 136 133*  K 4.5 4.8  CL 100 99  CO2 28 23  GLUCOSE 91 170*  BUN 14 19  CREATININE 0.97 0.89  CALCIUM 9.8 9.4   GFR: Estimated Creatinine Clearance: 31.2 mL/min (by C-G formula based on SCr of 0.89 mg/dL). Liver Function Tests: Recent Labs  Lab 09/21/20 1414  AST 23  ALT 20  ALKPHOS 85  BILITOT 1.0  PROT 7.4  ALBUMIN 3.7   No results for input(s): LIPASE, AMYLASE in the last 168 hours. No results for input(s): AMMONIA in the last 168 hours. Coagulation Profile: No results for input(s): INR, PROTIME in the last 168 hours. Cardiac Enzymes: No results for input(s): CKTOTAL, CKMB, CKMBINDEX, TROPONINI in the last 168 hours. BNP (last 3 results) No results for input(s): PROBNP in the last 8760 hours. HbA1C: No results for input(s): HGBA1C in the last 72 hours. CBG: No results for input(s): GLUCAP in the last 168 hours. Lipid Profile: No results for input(s): CHOL, HDL, LDLCALC, TRIG, CHOLHDL, LDLDIRECT in the last 72 hours. Thyroid Function Tests: No results for input(s): TSH, T4TOTAL, FREET4, T3FREE, THYROIDAB in the last 72 hours. Anemia Panel: No results for input(s): VITAMINB12, FOLATE, FERRITIN, TIBC, IRON, RETICCTPCT in the last 72 hours. Sepsis Labs: No results for input(s): PROCALCITON, LATICACIDVEN in the last 168 hours.  Recent Results (from the past 240 hour(s))  Resp Panel by RT-PCR (Flu A&B, Covid) Nasopharyngeal Swab     Status: None   Collection Time: 09/21/20  2:28 PM   Specimen: Nasopharyngeal Swab; Nasopharyngeal(NP) swabs in vial transport medium   Result Value Ref Range Status   SARS Coronavirus 2 by RT PCR NEGATIVE NEGATIVE Final    Comment: (NOTE) SARS-CoV-2 target nucleic acids are NOT DETECTED.  The SARS-CoV-2 RNA is generally detectable in upper respiratory specimens during the acute phase of infection. The lowest concentration of SARS-CoV-2 viral copies this assay can detect is 138 copies/mL. A negative result does not preclude SARS-Cov-2 infection and should not be used as the sole basis for treatment or other patient management decisions. A negative result may occur with  improper specimen collection/handling, submission of specimen other than nasopharyngeal swab, presence of viral mutation(s) within the areas targeted by this assay, and inadequate number of viral copies(<138 copies/mL). A negative result must be combined with clinical  observations, patient history, and epidemiological information. The expected result is Negative.  Fact Sheet for Patients:  BloggerCourse.com  Fact Sheet for Healthcare Providers:  SeriousBroker.it  This test is no t yet approved or cleared by the [place] FDA and  has been authorized for detection and/or diagnosis of SARS-CoV-2 by FDA under an Emergency Use Authorization (EUA). This EUA will remain  in effect (meaning this test can be used) for the duration of the COVID-19 declaration under Section 564(b)(1) of the Act, 21 U.S.C.section 360bbb-3(b)(1), unless the authorization is terminated  or revoked sooner.       Influenza A by PCR NEGATIVE NEGATIVE Final   Influenza B by PCR NEGATIVE NEGATIVE Final    Comment: (NOTE) The Xpert Xpress SARS-CoV-2/FLU/RSV plus assay is intended as an aid in the diagnosis of influenza from Nasopharyngeal swab specimens and should not be used as a sole basis for treatment. Nasal washings and aspirates are unacceptable for Xpert Xpress SARS-CoV-2/FLU/RSV testing.  Fact Sheet for  Patients: BloggerCourse.com  Fact Sheet for Healthcare Providers: SeriousBroker.it  This test is not yet approved or cleared by the [place] FDA and has been authorized for detection and/or diagnosis of SARS-CoV-2 by FDA under an Emergency Use Authorization (EUA). This EUA will remain in effect (meaning this test can be used) for the duration of the COVID-19 declaration under Section 564(b)(1) of the Act, 21 U.S.C. section 360bbb-3(b)(1), unless the authorization is terminated or revoked.  Performed at [facility], [address] [place], [place] [address]   Aerobic/Anaerobic Culture (surgical/deep wound)     Status: None (Preliminary result)   Collection Time: 09/21/20  5:33 PM   Specimen: PATH Bone biopsy; Tissue  Result Value Ref Range Status   Specimen Description   Final    BONE RIGHT FEMUR Performed at [facility], [address], [place], [place] [address]    Special Requests   Final    NONE Performed at [facility], [address], [place], [place] [address]    Gram Stain   Final    RARE WBC PRESENT, PREDOMINANTLY MONONUCLEAR NO ORGANISMS SEEN Performed at [facility], [address], [place], [place] [address]    Culture PENDING  Incomplete   Report Status PENDING  Incomplete      Radiology Studies: DG Knee Complete 4 Views Right  Result Date: 09/21/2020 CLINICAL DATA:  87 year old female with pain, suspected recent fall. EXAM: RIGHT KNEE - COMPLETE 4+ VIEW COMPARISON:  Right hip series 04/07/2019. FINDINGS: Bilateral total knee arthroplasties are visible on these images. There is a long segment spiral fracture of the distal right femur extending at least 12 cm, with posterior displacement 1 full shaft width, over riding by 6 or 7 cm, and medial angulation. Comminution. There is evidence of some periosteal new bone formation about the fracture  margins. The right knee hardware remains intact. The proximal femur fragment may be mildly impacted upon the right patella. Proximal right tibia and fibula appear intact. Calcified peripheral vascular disease. IMPRESSION: Subacute appearing long spiral fracture of the distal right femur extending from the shaft to the total knee hardware. One full shaft width posterior displacement with over riding of at least 6 cm, and medial angulation. Electronically Signed   By: Odessa Fleming M.D.   On: 09/21/2020 09:58      Scheduled Meds: . acetaminophen  500 mg Oral Q6H  . docusate sodium  100 mg Oral BID  . enoxaparin (  LOVENOX) injection  30 mg Subcutaneous Q24H  . pantoprazole  40 mg Oral Daily   Continuous Infusions: .  ceFAZolin (ANCEF) IV 2 g (09/22/20 0929)     LOS: 1 day      Time spent: 25 minutes   Noralee Stain, DO [facility] 09/22/2020, 11:26 AM   Available via Epic secure chat 7am-7pm After these hours, please refer to coverage provider listed on [email or web address]

## 2020-09-23 ENCOUNTER — Encounter (HOSPITAL_COMMUNITY): Payer: Self-pay | Admitting: Specialist

## 2020-09-23 DIAGNOSIS — S72451R Displaced supracondylar fracture without intracondylar extension of lower end of right femur, subsequent encounter for open fracture type IIIA, IIIB, or IIIC with malunion: Secondary | ICD-10-CM | POA: Diagnosis not present

## 2020-09-23 MED ORDER — CEFAZOLIN SODIUM-DEXTROSE 2-4 GM/100ML-% IV SOLN
2.0000 g | Freq: Three times a day (TID) | INTRAVENOUS | Status: DC
  Administered 2020-09-23 – 2020-09-24 (×2): 2 g via INTRAVENOUS
  Filled 2020-09-23 (×3): qty 100

## 2020-09-23 NOTE — Progress Notes (Signed)
PROGRESS NOTE    Roberta Griffin  FIE:[id number] DOB: [birth year] DOA: 09/21/2020 PCP: Sherron Monday, MD     Brief Narrative:  Roberta Griffin is a 87 year old female with past medical history of COPD, dementia, CKD 3b, hypertension, CVA, bilateral hip arthroplasty who presented to the ED via EMS from her SNF for right knee swelling and pain for an unknown period of time. Per granddaughter over the phone, the staff at the facility were trying to move her at some point last week and she hit her leg and initially thought she only had a superficial abrasion. Right knee Xray revealed subacute long spiral fracture of the distal right femur extending form the shaft to the total knee hardware, one full shaft width posterior displacement with overriding of at least 6 cm and medial angulation. She underwent irrigation and debridement of open right distal supracondylar femur fracture on 2/9.   New events last 24 hours / Subjective: Patient remains pleasantly confused, does not seem to be in any distress  Assessment & Plan:   Principal Problem:   Open displaced supracondylar fracture without intracondylar extension of lower end of right femur, type IIIA, IIIB, or IIIC, with malunion Active Problems:   CVA (cerebral vascular accident) (HCC)   COPD (chronic obstructive pulmonary disease) (HCC)   Alzheimer's dementia without behavioral disturbance (HCC)   CKD (chronic kidney disease) stage 3, GFR 30-59 ml/min (HCC)   Closed hip fracture, right, initial encounter (HCC)   Femur fracture (HCC)   Open femur fracture, right (HCC)   Subacute spiral fracture of the right femur -Status post irrigation and debridement of open supracondylar fracture -Patient is bedbound at baseline -Intra-Op wound cultures pending   Advanced dementia -Appears to be at or near baseline  CKD stage IIIa -Stable  Hypertension -Continue metoprolol  History of CVA -Continue aspirin  COPD -Without acute  exacerbation    DVT prophylaxis:  enoxaparin (LOVENOX) injection 30 mg Start: 09/22/20 0800 Place and maintain sequential compression device Start: 09/21/20 1455  Code Status: Full code Family Communication: None at bedside Disposition Plan:  Status is: Inpatient  Remains inpatient appropriate because:Unsafe d/c plan   Dispo: The patient is from: SNF              Anticipated d/c is to: SNF              Anticipated d/c date is: 1 day              Patient currently is not medically stable to d/c. Await final rec from ortho to determine dc planning.    Difficult to place patient No    Antimicrobials:  Anti-infectives (From admission, onward)   Start     Dose/Rate Route Frequency Ordered Stop   09/22/20 0200  ceFAZolin (ANCEF) IVPB 2g/100 mL premix        2 g 200 mL/hr over 30 Minutes Intravenous Every 8 hours 09/21/20 1950 09/22/20 1825   09/21/20 1745  ceFAZolin (ANCEF) IVPB 2g/100 mL premix        2 g 200 mL/hr over 30 Minutes Intravenous  Once 09/21/20 1650 09/21/20 1732   09/21/20 1652  ceFAZolin (ANCEF) 2-4 GM/100ML-% IVPB       Note to Pharmacy: Marney Doctor   : cabinet override      09/21/20 1652 09/21/20 1732       Objective: Vitals:   09/22/20 1101 09/22/20 1349 09/22/20 2108 09/23/20 0530  BP: 123/84 112/81 115/69 132/61  Pulse:  74 67  67  Resp: 19 16 14 15   Temp: 98 F (36.7 C) 98.5 F (36.9 C) 98.3 F (36.8 C) 97.7 F (36.5 C)  TempSrc: Axillary Axillary Axillary Axillary  SpO2: 100% 97% 100% 98%  Weight:      Height:        Intake/Output Summary (Last 24 hours) at 09/23/2020 1232 Last data filed at 09/23/2020 0953 Gross per 24 hour  Intake 420 ml  Output 600 ml  Net -180 ml   Filed Weights   09/21/20 0818  Weight: 49 kg    Examination: General exam: Appears calm and comfortable  Respiratory system: Clear to auscultation. Respiratory effort normal. Cardiovascular system: S1 & S2 heard, RRR. No pedal edema. Gastrointestinal system:  Abdomen is nondistended, soft and nontender. Normal bowel sounds heard. Central nervous system: Alert to voice  Psychiatry: Advanced dementia   Data Reviewed: I have personally reviewed following labs and imaging studies  CBC: Recent Labs  Lab 09/21/20 1414 09/22/20 0308  WBC 9.2 8.3  NEUTROABS 6.9  --   HGB 12.6 12.3  HCT 38.3 38.0  MCV 85.5 87.0  PLT 291 277   Basic Metabolic Panel: Recent Labs  Lab 09/21/20 1414 09/22/20 0308  NA 136 133*  K 4.5 4.8  CL 100 99  CO2 28 23  GLUCOSE 91 170*  BUN 14 19  CREATININE 0.97 0.89  CALCIUM 9.8 9.4   GFR: Estimated Creatinine Clearance: 31.2 mL/min (by C-G formula based on SCr of 0.89 mg/dL). Liver Function Tests: Recent Labs  Lab 09/21/20 1414  AST 23  ALT 20  ALKPHOS 85  BILITOT 1.0  PROT 7.4  ALBUMIN 3.7   No results for input(s): LIPASE, AMYLASE in the last 168 hours. No results for input(s): AMMONIA in the last 168 hours. Coagulation Profile: No results for input(s): INR, PROTIME in the last 168 hours. Cardiac Enzymes: No results for input(s): CKTOTAL, CKMB, CKMBINDEX, TROPONINI in the last 168 hours. BNP (last 3 results) No results for input(s): PROBNP in the last 8760 hours. HbA1C: No results for input(s): HGBA1C in the last 72 hours. CBG: No results for input(s): GLUCAP in the last 168 hours. Lipid Profile: No results for input(s): CHOL, HDL, LDLCALC, TRIG, CHOLHDL, LDLDIRECT in the last 72 hours. Thyroid Function Tests: No results for input(s): TSH, T4TOTAL, FREET4, T3FREE, THYROIDAB in the last 72 hours. Anemia Panel: No results for input(s): VITAMINB12, FOLATE, FERRITIN, TIBC, IRON, RETICCTPCT in the last 72 hours. Sepsis Labs: No results for input(s): PROCALCITON, LATICACIDVEN in the last 168 hours.  Recent Results (from the past 240 hour(s))  Resp Panel by RT-PCR (Flu A&B, Covid) Nasopharyngeal Swab     Status: None   Collection Time: 09/21/20  2:28 PM   Specimen: Nasopharyngeal Swab;  Nasopharyngeal(NP) swabs in vial transport medium  Result Value Ref Range Status   SARS Coronavirus 2 by RT PCR NEGATIVE NEGATIVE Final    Comment: (NOTE) SARS-CoV-2 target nucleic acids are NOT DETECTED.  The SARS-CoV-2 RNA is generally detectable in upper respiratory specimens during the acute phase of infection. The lowest concentration of SARS-CoV-2 viral copies this assay can detect is 138 copies/mL. A negative result does not preclude SARS-Cov-2 infection and should not be used as the sole basis for treatment or other patient management decisions. A negative result may occur with  improper specimen collection/handling, submission of specimen other than nasopharyngeal swab, presence of viral mutation(s) within the areas targeted by this assay, and inadequate number of viral copies(<138 copies/mL). A negative  result must be combined with clinical observations, patient history, and epidemiological information. The expected result is Negative.  Fact Sheet for Patients:  BloggerCourse.com  Fact Sheet for Healthcare Providers:  SeriousBroker.it  This test is no t yet approved or cleared by the [place] FDA and  has been authorized for detection and/or diagnosis of SARS-CoV-2 by FDA under an Emergency Use Authorization (EUA). This EUA will remain  in effect (meaning this test can be used) for the duration of the COVID-19 declaration under Section 564(b)(1) of the Act, 21 U.S.C.section 360bbb-3(b)(1), unless the authorization is terminated  or revoked sooner.       Influenza A by PCR NEGATIVE NEGATIVE Final   Influenza B by PCR NEGATIVE NEGATIVE Final    Comment: (NOTE) The Xpert Xpress SARS-CoV-2/FLU/RSV plus assay is intended as an aid in the diagnosis of influenza from Nasopharyngeal swab specimens and should not be used as a sole basis for treatment. Nasal washings and aspirates are unacceptable for Xpert Xpress  SARS-CoV-2/FLU/RSV testing.  Fact Sheet for Patients: BloggerCourse.com  Fact Sheet for Healthcare Providers: SeriousBroker.it  This test is not yet approved or cleared by the [place] FDA and has been authorized for detection and/or diagnosis of SARS-CoV-2 by FDA under an Emergency Use Authorization (EUA). This EUA will remain in effect (meaning this test can be used) for the duration of the COVID-19 declaration under Section 564(b)(1) of the Act, 21 U.S.C. section 360bbb-3(b)(1), unless the authorization is terminated or revoked.  Performed at [facility], [address] [place], [place] [address]   Aerobic/Anaerobic Culture (surgical/deep wound)     Status: None (Preliminary result)   Collection Time: 09/21/20  5:33 PM   Specimen: PATH Bone biopsy; Tissue  Result Value Ref Range Status   Specimen Description   Final    BONE RIGHT FEMUR Performed at [facility], [address], [place], [place] [address]    Special Requests   Final    NONE Performed at [facility], [address] [place], [place] [address]    Gram Stain   Final    RARE WBC PRESENT, PREDOMINANTLY MONONUCLEAR NO ORGANISMS SEEN    Culture   Final    CULTURE REINCUBATED FOR BETTER GROWTH Performed at [facility], [address] [place], [place] [address]    Report Status PENDING  Incomplete      Radiology Studies: No results found.    Scheduled Meds: . aspirin  81 mg Oral Daily  . docusate sodium  100 mg Oral BID  . enoxaparin (LOVENOX) injection  30 mg Subcutaneous Q24H  . feeding supplement  1 Container Oral Q24H  . feeding supplement  237 mL Oral Q24H  . metoprolol tartrate  12.5 mg Oral BID  . multivitamin with minerals  1 tablet Oral Daily  . pantoprazole  40 mg Oral Daily  . senna  2 tablet Oral QHS  . sucralfate  1 g Oral TID WC & HS   Continuous Infusions:     LOS: 2 days      Time spent: 20 minutes   Noralee Stain, DO [facility] 09/23/2020, 12:32 PM   Available via Epic secure chat 7am-7pm After these hours, please refer to coverage provider listed on [email or web address]

## 2020-09-23 NOTE — Plan of Care (Signed)
Cognitive limits impact the patient's ability to understand and retain new information.

## 2020-09-23 NOTE — TOC Initial Note (Signed)
Transition of Care [facility]) - Initial/Assessment Note    Patient Details  Name: Roberta Griffin MRN: [id number] Date of Birth: [birth year]  Transition of Care [facility]) CM/SW Contact:    Ida Rogue, LCSW Phone Number: 09/23/2020, 3:19 PM  Clinical Narrative:   Patient who is LTC resident of [place] will return there when stable for d/c.  Spoke with granddaughter to apprise her of plan.  FL2 completed. TOC will continue to follow during the course of hospitalization.                 Expected Discharge Plan: Skilled Nursing Facility Barriers to Discharge: No Barriers Identified   Patient Goals and CMS Choice        Expected Discharge Plan and Services Expected Discharge Plan: Skilled Nursing Facility In-house Referral: Clinical Social Work   Post Acute Care Choice: Skilled Nursing Facility Living arrangements for the past 2 months: Skilled Nursing Facility                                      Prior Living Arrangements/Services Living arrangements for the past 2 months: Skilled Nursing Facility Lives with:: Facility Resident Patient language and need for interpreter reviewed:: Yes        Need for Family Participation in Patient Care: Yes (Comment) Care giver support system in place?: Yes (comment)   Criminal Activity/Legal Involvement Pertinent to Current Situation/Hospitalization: No - Comment as needed  Activities of Daily Living Home Assistive Devices/Equipment: Blood pressure cuff,Grab bars around toilet,Grab bars in shower,Hand-held shower hose,Hospital bed,Hoyer Lift,Scales ADL Screening (condition at time of admission) Patient's cognitive ability adequate to safely complete daily activities?: No (patient has dementia) Is the patient deaf or have difficulty hearing?: Yes Does the patient have difficulty seeing, even when wearing glasses/contacts?: Yes Does the patient have difficulty concentrating, remembering, or making decisions?: Yes Patient able to  express need for assistance with ADLs?: No (patient is nonverbal) Does the patient have difficulty dressing or bathing?: Yes Independently performs ADLs?: No Communication: Appropriate for developmental age (patient nonverbal) Dressing (OT): Dependent Is this a change from baseline?: Pre-admission baseline Grooming: Dependent Is this a change from baseline?: Pre-admission baseline Feeding: Dependent Is this a change from baseline?: Pre-admission baseline Bathing: Dependent Is this a change from baseline?: Pre-admission baseline Toileting: Dependent Is this a change from baseline?: Pre-admission baseline In/Out Bed: Dependent Is this a change from baseline?: Pre-admission baseline Walks in Home: Dependent (patient bedbound) Is this a change from baseline?: Pre-admission baseline Does the patient have difficulty walking or climbing stairs?: Yes (secondary to weakness) Weakness of Legs: Both Weakness of Arms/Hands: Both  Permission Sought/Granted Permission sought to share information with : Family Supports Permission granted to share information with : No  Share Information with NAME: Jeanella Craze (Granddaughter)   [phone]           Emotional Assessment       Orientation: : Oriented to Self Alcohol / Substance Use: Not Applicable Psych Involvement: No (comment)  Admission diagnosis:  Femur fracture (HCC) [S72.90XA] Closed fracture of neck of right femur, initial encounter (HCC) [S72.001A] Open femur fracture, right (HCC) [S72.91XB] Patient Active Problem List   Diagnosis Date Noted  . Femur fracture (HCC) 09/21/2020  . Open displaced supracondylar fracture without intracondylar extension of lower end of right femur, type IIIA, IIIB, or IIIC, with malunion 09/21/2020    Class: Chronic  . Open femur fracture, right (  HCC) 09/21/2020  . Protein-calorie malnutrition, severe 03/31/2019  . History of pulmonary embolus (PE) 03/28/2019  . Hip fracture (HCC) 03/28/2019   . Fall 10/12/2018  . Hypothermia 10/11/2018  . Subcapital fracture of left hip (HCC) 07/29/2018  . Closed hip fracture, right, initial encounter (HCC) 07/26/2018  . Depression with anxiety 07/26/2017  . High risk medication use 06/03/2017  . Chronic pain of both shoulders 06/03/2017  . Schizophrenia (HCC) 04/26/2017  . GAD (generalized anxiety disorder) 04/05/2017  . History of stroke 12/05/2016  . Pulmonary embolus (HCC) 12/05/2016  . Chronic diastolic CHF (congestive heart failure) (HCC) 03/04/2016  . Insomnia 03/04/2016  . Dysarthria 03/04/2016  . Thyroid nodule 03/04/2016  . Pancreatic mass   . HLD (hyperlipidemia)   . Dysphagia 02/18/2016  . CVA (cerebral vascular accident) (HCC) 02/17/2016  . COPD (chronic obstructive pulmonary disease) (HCC) 02/17/2016  . Alzheimer's dementia without behavioral disturbance (HCC) 02/17/2016  . Essential hypertension 02/17/2016  . Normocytic anemia 02/17/2016  . CKD (chronic kidney disease) stage 3, GFR 30-59 ml/min (HCC) 02/17/2016   PCP:  Sherron Monday, MD Pharmacy:  No Pharmacies Listed    Social Determinants of Health (SDOH) Interventions    Readmission Risk Interventions No flowsheet data found.

## 2020-09-23 NOTE — Progress Notes (Signed)
     Subjective: 2 Days Post-Op Procedure(s) (LRB): INCISION AND DRAINAGE IRRIGATION AND DEBRIDEMENT OF OPEN RIGHT DISTAL SUPRACONDYLAR FEMUR FRACTURE (Right) Awake, granddaughter is feeding her. Dressing changed, There is warmth and erythrema but no purulence, Packing removed and the lateral suprapatella wound repacked,  Patient reports pain as moderate.    Objective:   VITALS:  Temp:  [97.1 F (36.2 C)-98.3 F (36.8 C)] 97.1 F (36.2 C) (02/11 1347) Pulse Rate:  [67-71] 71 (02/11 1347) Resp:  [14-16] 16 (02/11 1347) BP: (115-159)/(61-77) 159/77 (02/11 1347) SpO2:  [98 %-100 %] 98 % (02/11 0530)  Neurologically intact ABD soft Intact pulses distally Dorsiflexion/Plantar flexion intact Incision: scant drainage   LABS Recent Labs    09/21/20 1414 09/22/20 0308  HGB 12.6 12.3  WBC 9.2 8.3  PLT 291 277   Recent Labs    09/21/20 1414 09/22/20 0308  NA 136 133*  K 4.5 4.8  CL 100 99  CO2 28 23  BUN 14 19  CREATININE 0.97 0.89  GLUCOSE 91 170*   No results for input(s): LABPT, INR in the last 72 hours. Culture is growing Proteus Mirabilis, Will likely need to change antibiotic coverage and I will  Ask ID to help.  Assessment/Plan: 2 Days Post-Op Procedure(s) (LRB): INCISION AND DRAINAGE IRRIGATION AND DEBRIDEMENT OF OPEN RIGHT DISTAL SUPRACONDYLAR FEMUR FRACTURE (Right)  Advance diet Up with therapy Continue ABX therapy due to Culture taken of surgical site during incision, drainage and debridment of the open right distal femur fracture site.   Vira Browns 09/23/2020, 6:30 PMPatient ID: Roberta Griffin, female   DOB: [birth year], 87 y.o.   MRN: [id number]

## 2020-09-23 NOTE — NC FL2 (Signed)
[place] MEDICAID FL2 LEVEL OF CARE SCREENING TOOL     IDENTIFICATION  Patient Name: Roberta Griffin Birthdate: 02-21-1926 Sex: female Admission Date (Current Location): 09/21/2020  [facility] and [place] Number:  Producer, television/film/video and Address:  [facility],  [address], [place] [address]      Provider Number: [id number]  Attending Physician Name and Address:  Kerrin Champagne, MD  Relative Name and Phone Number:  Jeanella Craze (Granddaughter)   [phone]    Current Level of Care: Hospital Recommended Level of Care: Skilled Nursing Facility Prior Approval Number:    Date Approved/Denied:   PASRR Number:    Discharge Plan: SNF [facility])    Current Diagnoses: Patient Active Problem List   Diagnosis Date Noted  . Femur fracture (HCC) 09/21/2020  . Open displaced supracondylar fracture without intracondylar extension of lower end of right femur, type IIIA, IIIB, or IIIC, with malunion 09/21/2020  . Open femur fracture, right (HCC) 09/21/2020  . Protein-calorie malnutrition, severe 03/31/2019  . History of pulmonary embolus (PE) 03/28/2019  . Hip fracture (HCC) 03/28/2019  . Fall 10/12/2018  . Hypothermia 10/11/2018  . Subcapital fracture of left hip (HCC) 07/29/2018  . Closed hip fracture, right, initial encounter (HCC) 07/26/2018  . Depression with anxiety 07/26/2017  . High risk medication use 06/03/2017  . Chronic pain of both shoulders 06/03/2017  . Schizophrenia (HCC) 04/26/2017  . GAD (generalized anxiety disorder) 04/05/2017  . History of stroke 12/05/2016  . Pulmonary embolus (HCC) 12/05/2016  . Chronic diastolic CHF (congestive heart failure) (HCC) 03/04/2016  . Insomnia 03/04/2016  . Dysarthria 03/04/2016  . Thyroid nodule 03/04/2016  . Pancreatic mass   . HLD (hyperlipidemia)   . Dysphagia 02/18/2016  . CVA (cerebral vascular accident) (HCC) 02/17/2016  . COPD (chronic obstructive pulmonary disease) (HCC) 02/17/2016  .  Alzheimer's dementia without behavioral disturbance (HCC) 02/17/2016  . Essential hypertension 02/17/2016  . Normocytic anemia 02/17/2016  . CKD (chronic kidney disease) stage 3, GFR 30-59 ml/min (HCC) 02/17/2016    Orientation RESPIRATION BLADDER Height & Weight     Self  Normal External catheter Weight: 49 kg Height:  5\' 5"  (165.1 cm)  BEHAVIORAL SYMPTOMS/MOOD NEUROLOGICAL BOWEL NUTRITION STATUS      Incontinent Diet (see d/c summary)  AMBULATORY STATUS COMMUNICATION OF NEEDS Skin   Extensive Assist Verbally Surgical wounds (Knee,R)                       Personal Care Assistance Level of Assistance  Bathing,Feeding,Dressing Bathing Assistance: Maximum assistance Feeding assistance: Limited assistance Dressing Assistance: Maximum assistance     Functional Limitations Info  Sight,Hearing,Speech Sight Info: Impaired Hearing Info: Impaired Speech Info: Adequate    SPECIAL CARE FACTORS FREQUENCY  PT (By licensed PT)     PT Frequency: 5X/W              Contractures      Additional Factors Info  Code Status,Allergies Code Status Info: full Allergies Info: Tramadol           Current Medications (09/23/2020):  This is the current hospital active medication list Current Facility-Administered Medications  Medication Dose Route Frequency Provider Last Rate Last Admin  . acetaminophen (TYLENOL) tablet 325-650 mg  325-650 mg Oral Q6H PRN 11/21/2020, MD   325 mg at 09/23/20 0817  . aspirin chewable tablet 81 mg  81 mg Oral Daily 11/21/20, DO   81 mg at 09/23/20 0817  .  bisacodyl (DULCOLAX) EC tablet 5 mg  5 mg Oral Daily PRN Kerrin Champagne, MD      . docusate sodium (COLACE) capsule 100 mg  100 mg Oral BID Kerrin Champagne, MD   100 mg at 09/22/20 2312  . enoxaparin (LOVENOX) injection 30 mg  30 mg Subcutaneous Q24H Kerrin Champagne, MD   30 mg at 09/23/20 0825  . feeding supplement (BOOST / RESOURCE BREEZE) liquid 1 Container  1 Container Oral Q24H Kerrin Champagne, MD   1 Container at 09/23/20 [phone]  . feeding supplement (ENSURE ENLIVE / ENSURE PLUS) liquid 237 mL  237 mL Oral Q24H Kerrin Champagne, MD   237 mL at 09/23/20 1409  . HYDROcodone-acetaminophen (NORCO) 7.5-325 MG per tablet 1-2 tablet  1-2 tablet Oral Q4H PRN Kerrin Champagne, MD   1 tablet at 09/23/20 0817  . HYDROcodone-acetaminophen (NORCO/VICODIN) 5-325 MG per tablet 1-2 tablet  1-2 tablet Oral Q4H PRN Kerrin Champagne, MD   2 tablet at 09/22/20 1258  . menthol-cetylpyridinium (CEPACOL) lozenge 3 mg  1 lozenge Oral PRN Kerrin Champagne, MD       Or  . phenol (CHLORASEPTIC) mouth spray 1 spray  1 spray Mouth/Throat PRN Kerrin Champagne, MD      . metoCLOPramide (REGLAN) tablet 5-10 mg  5-10 mg Oral Q8H PRN Kerrin Champagne, MD       Or  . metoCLOPramide (REGLAN) injection 5-10 mg  5-10 mg Intravenous Q8H PRN Kerrin Champagne, MD      . metoprolol tartrate (LOPRESSOR) tablet 12.5 mg  12.5 mg Oral BID Noralee Stain, DO   12.5 mg at 09/23/20 0817  . morphine 2 MG/ML injection 0.5-1 mg  0.5-1 mg Intravenous Q2H PRN Kerrin Champagne, MD   1 mg at 09/22/20 1122  . multivitamin with minerals tablet 1 tablet  1 tablet Oral Daily Kerrin Champagne, MD   1 tablet at 09/23/20 0817  . ondansetron (ZOFRAN) tablet 4 mg  4 mg Oral Q6H PRN Kerrin Champagne, MD       Or  . ondansetron [facility]) injection 4 mg  4 mg Intravenous Q6H PRN Kerrin Champagne, MD      . pantoprazole (PROTONIX) EC tablet 40 mg  40 mg Oral Daily Kerrin Champagne, MD   40 mg at 09/23/20 0817  . polyethylene glycol (MIRALAX / GLYCOLAX) packet 17 g  17 g Oral Daily PRN Kerrin Champagne, MD   17 g at 09/23/20 1914  . senna (SENOKOT) tablet 17.2 mg  2 tablet Oral QHS Noralee Stain, DO      . sucralfate (CARAFATE) tablet 1 g  1 g Oral TID WC & HS Noralee Stain, DO   1 g at 09/23/20 1228     Discharge Medications: Please see discharge summary for a list of discharge medications.  Relevant Imaging Results:  Relevant Lab Results:   Additional  Information SSN: [id number]  Ida Rogue, LCSW

## 2020-09-23 NOTE — Care Management Important Message (Signed)
Medicare IM printed for Roberta Sinclair, LCSW to give to the patient. 

## 2020-09-24 ENCOUNTER — Inpatient Hospital Stay: Payer: Self-pay

## 2020-09-24 DIAGNOSIS — M86251 Subacute osteomyelitis, right femur: Secondary | ICD-10-CM

## 2020-09-24 DIAGNOSIS — S72451R Displaced supracondylar fracture without intracondylar extension of lower end of right femur, subsequent encounter for open fracture type IIIA, IIIB, or IIIC with malunion: Secondary | ICD-10-CM | POA: Diagnosis not present

## 2020-09-24 LAB — BASIC METABOLIC PANEL
Anion gap: 11 (ref 5–15)
BUN: 24 mg/dL — ABNORMAL HIGH (ref 8–23)
CO2: 24 mmol/L (ref 22–32)
Calcium: 9.2 mg/dL (ref 8.9–10.3)
Chloride: 99 mmol/L (ref 98–111)
Creatinine, Ser: 0.96 mg/dL (ref 0.44–1.00)
GFR, Estimated: 56 mL/min — ABNORMAL LOW (ref >60)
Glucose, Bld: 133 mg/dL — ABNORMAL HIGH (ref 70–99)
Potassium: 4.9 mmol/L (ref 3.5–5.1)
Sodium: 134 mmol/L — ABNORMAL LOW (ref 135–145)

## 2020-09-24 LAB — CBC WITH DIFFERENTIAL/PLATELET
Abs Immature Granulocytes: 0.05 10*3/uL (ref 0.00–0.07)
Basophils Absolute: 0 10*3/uL (ref 0.0–0.1)
Basophils Relative: 0 %
Eosinophils Absolute: 0 10*3/uL (ref 0.0–0.5)
Eosinophils Relative: 0 %
HCT: 36.9 % (ref 36.0–46.0)
Hemoglobin: 11.8 g/dL — ABNORMAL LOW (ref 12.0–15.0)
Immature Granulocytes: 1 %
Lymphocytes Relative: 12 %
Lymphs Abs: 1.1 10*3/uL (ref 0.7–4.0)
MCH: 28.2 pg (ref 26.0–34.0)
MCHC: 32 g/dL (ref 30.0–36.0)
MCV: 88.1 fL (ref 80.0–100.0)
Monocytes Absolute: 1.1 10*3/uL — ABNORMAL HIGH (ref 0.1–1.0)
Monocytes Relative: 12 %
Neutro Abs: 7.1 10*3/uL (ref 1.7–7.7)
Neutrophils Relative %: 75 %
Platelets: 300 10*3/uL (ref 150–400)
RBC: 4.19 MIL/uL (ref 3.87–5.11)
RDW: 14.2 % (ref 11.5–15.5)
WBC: 9.3 10*3/uL (ref 4.0–10.5)
nRBC: 0 % (ref 0.0–0.2)

## 2020-09-24 LAB — C-REACTIVE PROTEIN: CRP: 4.2 mg/dL — ABNORMAL HIGH (ref <1.0)

## 2020-09-24 MED ORDER — SODIUM CHLORIDE 0.9 % IV SOLN
2.0000 g | Freq: Every day | INTRAVENOUS | Status: DC
  Administered 2020-09-24 – 2020-09-26 (×3): 2 g via INTRAVENOUS
  Filled 2020-09-24 (×2): qty 20
  Filled 2020-09-24: qty 2

## 2020-09-24 MED ORDER — ACETAMINOPHEN-CODEINE #3 300-30 MG PO TABS
1.0000 | ORAL_TABLET | Freq: Four times a day (QID) | ORAL | Status: DC | PRN
  Administered 2020-09-25 (×2): 2 via ORAL
  Administered 2020-09-25: 1 via ORAL
  Administered 2020-09-26: 2 via ORAL
  Filled 2020-09-24: qty 1
  Filled 2020-09-24 (×2): qty 2
  Filled 2020-09-24: qty 1
  Filled 2020-09-24: qty 2

## 2020-09-24 NOTE — Progress Notes (Signed)
IV team assessed BUE for PICC placement.  Unable to abduct BUE for PICC placement.  Secure chat sent to Dr Otelia Sergeant and Dr Drue Second to recommend IR referral for this pt due to contractures.

## 2020-09-24 NOTE — Consult Note (Signed)
[facility]  Total days of antibiotics 3       Reason for Consult: proteus bursitis vs pji  of right knee    Referring Physician: nitka  Principal Problem:   Open displaced supracondylar fracture without intracondylar extension of lower end of right femur, type IIIA, IIIB, or IIIC, with malunion Active Problems:   CVA (cerebral vascular accident) (HCC)   COPD (chronic obstructive pulmonary disease) (HCC)   Alzheimer's dementia without behavioral disturbance (HCC)   CKD (chronic kidney disease) stage 3, GFR 30-59 ml/min (HCC)   Closed hip fracture, right, initial encounter (HCC)   Femur fracture (HCC)   Open femur fracture, right (HCC)    HPI: Roberta Griffin is a 87 y.o. female with history of dementia, COPD, CKD 3, hx of bilateral hip arthroplasty, admitted for right knee swelling and pain suspected > than a week. Initially thought to be superficial abrasian with eschar development but also has evidence of subacute long spiral fracture to distal right femur extending from the shaft to the total knee hardware. She underwent I x D of open right distal supracondylar femur fracture on 2/9 by dr Otelia Sergeant. Infection that tracked within bursa but did not probe into the joint space. OR cultures grew proteus with sensitivities pending. She has been on cefazolin since the surgery. She remains afebrile. Sed rate of 25.   Past Medical History:  Diagnosis Date  . COPD (chronic obstructive pulmonary disease) (HCC)   . Dementia (HCC)   . Depression   . Dysphagia   . Hypertension   . Scoliosis     Allergies:  Allergies  Allergen Reactions  . Tramadol Other (See Comments)    twitching    Current antibiotics:   MEDICATIONS: . aspirin  81 mg Oral Daily  . docusate sodium  100 mg Oral BID  . enoxaparin (LOVENOX) injection  30 mg Subcutaneous Q24H  . feeding supplement  1 Container Oral Q24H  . feeding supplement  237 mL Oral Q24H  . metoprolol tartrate  12.5 mg  Oral BID  . multivitamin with minerals  1 tablet Oral Daily  . pantoprazole  40 mg Oral Daily  . senna  2 tablet Oral QHS  . sucralfate  1 g Oral TID WC & HS    Social History   Tobacco Use  . Smoking status: Never Smoker  . Smokeless tobacco: Never Used  Substance Use Topics  . Alcohol use: No  . Drug use: No    Family History  Problem Relation Age of Onset  . Stroke Neg Hx     Review of Systems - Unable to answer all ROS questions due to underlying dementia.  OBJECTIVE: Temp:  [97.1 F (36.2 C)-98.9 F (37.2 C)] 98.9 F (37.2 C) (02/12 0516) Pulse Rate:  [71-72] 72 (02/12 0516) Resp:  [16] 16 (02/12 0516) BP: (135-159)/(65-84) 135/84 (02/12 0516) SpO2:  [96 %-97 %] 96 % (02/12 0516) Physical Exam  Constitutional:  oriented to person,only. appears stated age  and under-nourished. No distress.  HENT: Derma/AT, PERRLA, no scleral icterus Mouth/Throat: Oropharynx is clear and moist. No oropharyngeal exudate.  Cardiovascular: Normal rate, regular rhythm and normal heart sounds. Exam reveals no gallop and no friction rub.  No murmur heard.  Pulmonary/Chest: Effort normal and breath sounds normal. No respiratory distress.  has no wheezes.  Neck = supple, no nuchal rigidity Abdominal: Soft. Bowel sounds are normal.  exhibits no distension. There is no tenderness.  Lymphadenopathy: no cervical  adenopathy. No axillary adenopathy Neurological: alert and oriented to person, only Skin: Skin is warm and dry. No rash noted. No erythema. Right leg knee immobilized.  Psychiatric:intermittently crying  LABS: Results for orders placed or performed during the hospital encounter of 09/21/20 (from the past 48 hour(s))  CBC with Differential/Platelet     Status: Abnormal   Collection Time: 09/24/20  3:08 AM  Result Value Ref Range   WBC 9.3 4.0 - 10.5 K/uL   RBC 4.19 3.87 - 5.11 MIL/uL   Hemoglobin 11.8 (L) 12.0 - 15.0 g/dL   HCT 16.3 84.6 - 65.9 %   MCV 88.1 80.0 - 100.0 fL   MCH  28.2 26.0 - 34.0 pg   MCHC 32.0 30.0 - 36.0 g/dL   RDW 93.5 70.1 - 77.9 %   Platelets 300 150 - 400 K/uL   nRBC 0.0 0.0 - 0.2 %   Neutrophils Relative % 75 %   Neutro Abs 7.1 1.7 - 7.7 K/uL   Lymphocytes Relative 12 %   Lymphs Abs 1.1 0.7 - 4.0 K/uL   Monocytes Relative 12 %   Monocytes Absolute 1.1 (H) 0.1 - 1.0 K/uL   Eosinophils Relative 0 %   Eosinophils Absolute 0.0 0.0 - 0.5 K/uL   Basophils Relative 0 %   Basophils Absolute 0.0 0.0 - 0.1 K/uL   Immature Granulocytes 1 %   Abs Immature Granulocytes 0.05 0.00 - 0.07 K/uL    Comment: Performed at [facility], [address], [place], [place] [address]  Basic metabolic panel     Status: Abnormal   Collection Time: 09/24/20  3:08 AM  Result Value Ref Range   Sodium 134 (L) 135 - 145 mmol/L   Potassium 4.9 3.5 - 5.1 mmol/L   Chloride 99 98 - 111 mmol/L   CO2 24 22 - 32 mmol/L   Glucose, Bld 133 (H) 70 - 99 mg/dL    Comment: Glucose reference range applies only to samples taken after fasting for at least 8 hours.   BUN 24 (H) 8 - 23 mg/dL   Creatinine, Ser 0.92 0.44 - 1.00 mg/dL   Calcium 9.2 8.9 - 33.0 mg/dL   GFR, Estimated 56 (L) >60 mL/min    Comment: (NOTE) Calculated using the CKD-EPI Creatinine Equation (2021)    Anion gap 11 5 - 15    Comment: Performed at [facility], [address] [place], [place] [address]  C-reactive protein     Status: Abnormal   Collection Time: 09/24/20  3:08 AM  Result Value Ref Range   CRP 4.2 (H) <1.0 mg/dL    Comment: Performed at [facility], [address] [place], [place] [address]    MICRO: pending IMAGING: No results found.   Assessment/Plan:  Presumed proteus right knee pji  - will change antibiotics to ceftriaxone 2gm Iv Daily -await to for sensitivities - will likely need picc line for Iv abtx of 6wks - will check sed rate and crp tomorrow - wound management per dr Otelia Sergeant.  Duke Salvia Drue Second MD  [facility] [phone]

## 2020-09-24 NOTE — Progress Notes (Addendum)
PROGRESS NOTE    Roberta Griffin  MVH:[id number] DOB: 09/06/1925 DOA: 09/21/2020 PCP: Sherron Mondayejan-Sie, S Ahmed, MD     Brief Narrative:  Roberta BeringDorothy Griffin is a 87 year old female with past medical history of COPD, dementia, CKD 3b, hypertension, CVA, bilateral hip arthroplasty who presented to the ED via EMS from her SNF for right knee swelling and pain for an unknown period of time. Per granddaughter over the phone, the staff at the facility were trying to move her at some point last week and she hit her leg and initially thought she only had a superficial abrasion. Right knee Xray revealed subacute long spiral fracture of the distal right femur extending form the shaft to the total knee hardware, one full shaft width posterior displacement with overriding of at least 6 cm and medial angulation. She underwent irrigation and debridement of open right distal supracondylar femur fracture on 2/9.   New events last 24 hours / Subjective: Remains about the same, baseline dementia.  Denies any pain.  Assessment & Plan:   Principal Problem:   Open displaced supracondylar fracture without intracondylar extension of lower end of right femur, type IIIA, IIIB, or IIIC, with malunion Active Problems:   CVA (cerebral vascular accident) (HCC)   COPD (chronic obstructive pulmonary disease) (HCC)   Alzheimer's dementia without behavioral disturbance (HCC)   CKD (chronic kidney disease) stage 3, GFR 30-59 ml/min (HCC)   Closed hip fracture, right, initial encounter (HCC)   Femur fracture (HCC)   Open femur fracture, right (HCC)   Subacute spiral fracture of the right femur -Status post irrigation and debridement of open supracondylar fracture -Patient is bedbound at baseline -Intra-Op wound cultures showing rare Proteus mirabilis, susceptibilities to follow -Continue Ancef, discussed with pharmacy   Advanced dementia -Appears to be at or near baseline  CKD stage IIIa -Stable  Hypertension -Continue  metoprolol  History of CVA -Continue aspirin  COPD -Without acute exacerbation    DVT prophylaxis:  enoxaparin (LOVENOX) injection 30 mg Start: 09/22/20 0800 Place and maintain sequential compression device Start: 09/21/20 1455  Code Status: Full code Family Communication: None at bedside Disposition Plan:  Status is: Inpatient  Remains inpatient appropriate because:Unsafe d/c plan   Dispo: The patient is from: SNF              Anticipated d/c is to: SNF              Anticipated d/c date is: 1 day              Patient currently is not medically stable to d/c. Await final rec from ortho to determine dc planning and antibiotic treatment.    Difficult to place patient No    Antimicrobials:  Anti-infectives (From admission, onward)   Start     Dose/Rate Route Frequency Ordered Stop   09/23/20 2000  ceFAZolin (ANCEF) IVPB 2g/100 mL premix        2 g 200 mL/hr over 30 Minutes Intravenous Every 8 hours 09/23/20 1840     09/22/20 0200  ceFAZolin (ANCEF) IVPB 2g/100 mL premix        2 g 200 mL/hr over 30 Minutes Intravenous Every 8 hours 09/21/20 1950 09/22/20 1825   09/21/20 1745  ceFAZolin (ANCEF) IVPB 2g/100 mL premix        2 g 200 mL/hr over 30 Minutes Intravenous  Once 09/21/20 1650 09/21/20 1732   09/21/20 1652  ceFAZolin (ANCEF) 2-4 GM/100ML-% IVPB       Note to Pharmacy:  Marney Doctor   : cabinet override      09/21/20 1652 09/21/20 1732       Objective: Vitals:   09/23/20 0530 09/23/20 1347 09/23/20 2049 09/24/20 0516  BP: 132/61 (!) 159/77 (!) 148/65 135/84  Pulse: 67 71 72 72  Resp: 15 16 16 16   Temp: 97.7 F (36.5 C) (!) 97.1 F (36.2 C) 98 F (36.7 C) 98.9 F (37.2 C)  TempSrc: Axillary Axillary    SpO2: 98%  97% 96%  Weight:      Height:        Intake/Output Summary (Last 24 hours) at 09/24/2020 1025 Last data filed at 09/24/2020 0900 Gross per 24 hour  Intake 380.06 ml  Output 1050 ml  Net -669.94 ml   Filed Weights   09/21/20 0818   Weight: 49 kg    Examination: General exam: Appears calm and comfortable  Respiratory system: Clear to auscultation. Respiratory effort normal. Cardiovascular system: S1 & S2 heard, RRR. No pedal edema. Gastrointestinal system: Abdomen is nondistended, soft and nontender. Normal bowel sounds heard. Central nervous system: Alert to voice Psychiatry: Judgement and insight appear poor.  Advanced dementia  Data Reviewed: I have personally reviewed following labs and imaging studies  CBC: Recent Labs  Lab 09/21/20 1414 09/22/20 0308 09/24/20 0308  WBC 9.2 8.3 9.3  NEUTROABS 6.9  --  7.1  HGB 12.6 12.3 11.8*  HCT 38.3 38.0 36.9  MCV 85.5 87.0 88.1  PLT 291 277 300   Basic Metabolic Panel: Recent Labs  Lab 09/21/20 1414 09/22/20 0308 09/24/20 0308  NA 136 133* 134*  K 4.5 4.8 4.9  CL 100 99 99  CO2 28 23 24   GLUCOSE 91 170* 133*  BUN 14 19 24*  CREATININE 0.97 0.89 0.96  CALCIUM 9.8 9.4 9.2   GFR: Estimated Creatinine Clearance: 28.9 mL/min (by C-G formula based on SCr of 0.96 mg/dL). Liver Function Tests: Recent Labs  Lab 09/21/20 1414  AST 23  ALT 20  ALKPHOS 85  BILITOT 1.0  PROT 7.4  ALBUMIN 3.7   No results for input(s): LIPASE, AMYLASE in the last 168 hours. No results for input(s): AMMONIA in the last 168 hours. Coagulation Profile: No results for input(s): INR, PROTIME in the last 168 hours. Cardiac Enzymes: No results for input(s): CKTOTAL, CKMB, CKMBINDEX, TROPONINI in the last 168 hours. BNP (last 3 results) No results for input(s): PROBNP in the last 8760 hours. HbA1C: No results for input(s): HGBA1C in the last 72 hours. CBG: No results for input(s): GLUCAP in the last 168 hours. Lipid Profile: No results for input(s): CHOL, HDL, LDLCALC, TRIG, CHOLHDL, LDLDIRECT in the last 72 hours. Thyroid Function Tests: No results for input(s): TSH, T4TOTAL, FREET4, T3FREE, THYROIDAB in the last 72 hours. Anemia Panel: No results for input(s):  VITAMINB12, FOLATE, FERRITIN, TIBC, IRON, RETICCTPCT in the last 72 hours. Sepsis Labs: No results for input(s): PROCALCITON, LATICACIDVEN in the last 168 hours.  Recent Results (from the past 240 hour(s))  Resp Panel by RT-PCR (Flu A&B, Covid) Nasopharyngeal Swab     Status: None   Collection Time: 09/21/20  2:28 PM   Specimen: Nasopharyngeal Swab; Nasopharyngeal(NP) swabs in vial transport medium  Result Value Ref Range Status   SARS Coronavirus 2 by RT PCR NEGATIVE NEGATIVE Final    Comment: (NOTE) SARS-CoV-2 target nucleic acids are NOT DETECTED.  The SARS-CoV-2 RNA is generally detectable in upper respiratory specimens during the acute phase of infection. The lowest concentration of SARS-CoV-2  viral copies this assay can detect is 138 copies/mL. A negative result does not preclude SARS-Cov-2 infection and should not be used as the sole basis for treatment or other patient management decisions. A negative result may occur with  improper specimen collection/handling, submission of specimen other than nasopharyngeal swab, presence of viral mutation(s) within the areas targeted by this assay, and inadequate number of viral copies(<138 copies/mL). A negative result must be combined with clinical observations, patient history, and epidemiological information. The expected result is Negative.  Fact Sheet for Patients:  BloggerCourse.com  Fact Sheet for Healthcare Providers:  SeriousBroker.it  This test is no t yet approved or cleared by the [place] FDA and  has been authorized for detection and/or diagnosis of SARS-CoV-2 by FDA under an Emergency Use Authorization (EUA). This EUA will remain  in effect (meaning this test can be used) for the duration of the COVID-19 declaration under Section 564(b)(1) of the Act, 21 U.S.C.section 360bbb-3(b)(1), unless the authorization is terminated  or revoked sooner.       Influenza A  by PCR NEGATIVE NEGATIVE Final   Influenza B by PCR NEGATIVE NEGATIVE Final    Comment: (NOTE) The Xpert Xpress SARS-CoV-2/FLU/RSV plus assay is intended as an aid in the diagnosis of influenza from Nasopharyngeal swab specimens and should not be used as a sole basis for treatment. Nasal washings and aspirates are unacceptable for Xpert Xpress SARS-CoV-2/FLU/RSV testing.  Fact Sheet for Patients: BloggerCourse.com  Fact Sheet for Healthcare Providers: SeriousBroker.it  This test is not yet approved or cleared by the [place] FDA and has been authorized for detection and/or diagnosis of SARS-CoV-2 by FDA under an Emergency Use Authorization (EUA). This EUA will remain in effect (meaning this test can be used) for the duration of the COVID-19 declaration under Section 564(b)(1) of the Act, 21 U.S.C. section 360bbb-3(b)(1), unless the authorization is terminated or revoked.  Performed at [facility], [address] [place], [place] [address]   Aerobic/Anaerobic Culture (surgical/deep wound)     Status: None (Preliminary result)   Collection Time: 09/21/20  5:33 PM   Specimen: PATH Bone biopsy; Tissue  Result Value Ref Range Status   Specimen Description   Final    BONE RIGHT FEMUR Performed at [facility], [address] [place], [place] [address]    Special Requests   Final    NONE Performed at [facility], [address], [place], [place] [address]    Gram Stain   Final    RARE WBC PRESENT, PREDOMINANTLY MONONUCLEAR NO ORGANISMS SEEN    Culture   Final    RARE PROTEUS MIRABILIS SUSCEPTIBILITIES TO FOLLOW Performed at [facility], [address], [place], [place] [address]    Report Status PENDING  Incomplete      Radiology Studies: No results found.    Scheduled Meds: . aspirin  81 mg Oral Daily  . docusate sodium  100 mg Oral BID  .  enoxaparin (LOVENOX) injection  30 mg Subcutaneous Q24H  . feeding supplement  1 Container Oral Q24H  . feeding supplement  237 mL Oral Q24H  . metoprolol tartrate  12.5 mg Oral BID  . multivitamin with minerals  1 tablet Oral Daily  . pantoprazole  40 mg Oral Daily  . senna  2 tablet Oral QHS  . sucralfate  1 g Oral TID WC & HS   Continuous Infusions: .  ceFAZolin (ANCEF) IV 2 g (09/24/20 0430)  LOS: 3 days      Time spent: 20 minutes   Noralee Stain, DO [facility] 09/24/2020, 10:25 AM   Available via Epic secure chat 7am-7pm After these hours, please refer to coverage provider listed on [email or web address]

## 2020-09-24 NOTE — Progress Notes (Addendum)
     Subjective: 3 Days Post-Op Procedure(s) (LRB): INCISION AND DRAINAGE IRRIGATION AND DEBRIDEMENT OF OPEN RIGHT DISTAL SUPRACONDYLAR FEMUR FRACTURE (Right) Avss, BMET with decreased blood sugar, CBC with 9.3K WBC, Hgb 11.8 Several requests today by epic, I am changing patients dressing but my office Is covering call for this patient otherwise. [phone] [facility].  They will Help with any questions concerning her care. I appreciate Dr. Alvino Chapel writing for IR for [facility]. Patient reports pain as moderate.  After one vicodin tablet patient is somulent and not moving. I will change to Tylenol#3 for pain. Fracture is likely 37 weeks old as there is callous and the thigh Moves as a unit.   Objective:   VITALS:  Temp:  [97.6 F (36.4 C)-98.9 F (37.2 C)] 97.6 F (36.4 C) (02/12 1355) Pulse Rate:  [72-77] 77 (02/12 1355) Resp:  [16] 16 (02/12 1355) BP: (135-148)/(60-84) 142/60 (02/12 1355) SpO2:  [96 %-98 %] 98 % (02/12 1355)  Neurologically intact ABD soft Sensation intact distally Intact pulses distally Dorsiflexion/Plantar flexion intact Incision: moderate drainage Compartment soft Right distal lateral supracondylar femur open wound packed with 1 and a small amount of a Second NS soaked 4x4, ABd and ACE applied.   LABS Recent Labs    09/22/20 0308 09/24/20 0308  HGB 12.3 11.8*  WBC 8.3 9.3  PLT 277 300   Recent Labs    09/22/20 0308 09/24/20 0308  NA 133* 134*  K 4.8 4.9  CL 99 99  CO2 23 24  BUN 19 24*  CREATININE 0.89 0.96  GLUCOSE 170* 133*   No results for input(s): LABPT, INR in the last 72 hours.  NO ANAEROBES ISOLATED; CULTURE IN PROGRESS FOR 5 DAYS    Report Status PENDING   Organism ID, Bacteria PROTEUS MIRABILIS   Resulting Agency [facility]      Susceptibility   Proteus mirabilis    MIC    AMPICILLIN <=2 SENSITIVE  Sensitive    AMPICILLIN/SULBACTAM <=2 SENSITIVE  Sensitive    CEFAZOLIN <=4 SENSITIVE  Sensitive     CEFEPIME <=0.12 SENS... Sensitive    CEFTAZIDIME <=1 SENSITIVE  Sensitive    CEFTRIAXONE <=0.25 SENS... Sensitive    CIPROFLOXACIN >=4 RESISTANT  Resistant    GENTAMICIN >=16 RESIST... Resistant    IMIPENEM 2 SENSITIVE  Sensitive    PIP/TAZO <=4 SENSITIVE  Sensitive    TRIMETH/SULFA >=320 RESIS... Resistant           Susceptibility Comments         Assessment/Plan: 3 Days Post-Op Procedure(s) (LRB): INCISION AND DRAINAGE IRRIGATION AND DEBRIDEMENT OF OPEN RIGHT DISTAL SUPRACONDYLAR FEMUR FRACTURE (Right)  Advance diet Up with therapy Continue ABX therapy due to Culture taken of surgical site during incision and debridement of the open fracture wound shows proteus mirabilis. Discharge to SNF expected when [facility] line is established and results of lab help determine sensitivities  Vira Browns 09/24/2020, 7:17 PMPatient ID: Roberta Griffin, female   DOB: 1926-03-21, 87 y.o.   MRN: [id number]

## 2020-09-25 DIAGNOSIS — S72451R Displaced supracondylar fracture without intracondylar extension of lower end of right femur, subsequent encounter for open fracture type IIIA, IIIB, or IIIC with malunion: Secondary | ICD-10-CM | POA: Diagnosis not present

## 2020-09-25 LAB — C-REACTIVE PROTEIN: CRP: 3.7 mg/dL — ABNORMAL HIGH (ref <1.0)

## 2020-09-25 LAB — SEDIMENTATION RATE: Sed Rate: 55 mm/hr — ABNORMAL HIGH (ref 0–22)

## 2020-09-25 NOTE — Hospital Course (Signed)
     Subjective: 4 Days Post-Op Procedure(s) (LRB): INCISION AND DRAINAGE IRRIGATION AND DEBRIDEMENT OF OPEN RIGHT DISTAL SUPRACONDYLAR FEMUR FRACTURE (Right) Awake, alert and oriented X1.  Moaning even when not  Working on the wound.  Patient reports pain as moderate.    Objective:   VITALS:  Temp:  [98.4 F (36.9 C)-99 F (37.2 C)] 98.4 F (36.9 C) (02/13 1309) Pulse Rate:  [68-77] 77 (02/13 1309) Resp:  [14-16] 14 (02/13 1309) BP: (122-149)/(67-88) 122/88 (02/13 1309) SpO2:  [96 %-99 %] 99 % (02/13 1309)  Neurologically intact ABD soft Neurovascular intact Sensation intact distally Dorsiflexion/Plantar flexion intact Incision: moderate drainage No cellulitis present Repacked with NS wet to dry one 4x4   LABS Recent Labs    09/24/20 0308  HGB 11.8*  WBC 9.3  PLT 300   Recent Labs    09/24/20 0308  NA 134*  K 4.9  CL 99  CO2 24  BUN 24*  CREATININE 0.96  GLUCOSE 133*   No results for input(s): LABPT, INR in the last 72 hours.   Assessment/Plan: 4 Days Post-Op Procedure(s) (LRB): INCISION AND DRAINAGE IRRIGATION AND DEBRIDEMENT OF OPEN RIGHT DISTAL SUPRACONDYLAR FEMUR FRACTURE (Right)  Advance diet Up with therapy Continue ABX therapy due to Culture taken of surgical site during jincision and drainage open femur fracture. Discharge to SNF  Vira Browns 09/25/2020, 3:52 PM

## 2020-09-25 NOTE — Progress Notes (Signed)
Patient ID: Roberta Griffin, female   DOB: [birth year], 87 y.o.   MRN: [id number] Patient without complaints this morning her heels are floated off the bed the knee immobilizer is in place.  Continue IV antibiotics.

## 2020-09-25 NOTE — Progress Notes (Signed)
PROGRESS NOTE    Roberta BeringDorothy Griffin  ZOX:[id number] DOB: 05/13/1926 DOA: 09/21/2020 PCP: Sherron Mondayejan-Sie, S Ahmed, MD     Brief Narrative:  Roberta BeringDorothy Griffin is a 87 year old female with past medical history of COPD, dementia, CKD 3b, hypertension, CVA, bilateral hip arthroplasty who presented to the ED via EMS from her SNF for right knee swelling and pain for an unknown period of time. Per granddaughter over the phone, the staff at the facility were trying to move her at some point last week and she hit her leg and initially thought she only had a superficial abrasion. Right knee Xray revealed subacute long spiral fracture of the distal right femur extending form the shaft to the total knee hardware, one full shaft width posterior displacement with overriding of at least 6 cm and medial angulation. She underwent irrigation and debridement of open right distal supracondylar femur fracture on 2/9.   New events last 24 hours / Subjective: Patient crying this morning after getting her labs drawn.  She remains confused on examination.  Assessment & Plan:   Principal Problem:   Open displaced supracondylar fracture without intracondylar extension of lower end of right femur, type IIIA, IIIB, or IIIC, with malunion Active Problems:   CVA (cerebral vascular accident) (HCC)   COPD (chronic obstructive pulmonary disease) (HCC)   Alzheimer's dementia without behavioral disturbance (HCC)   CKD (chronic kidney disease) stage 3, GFR 30-59 ml/min (HCC)   Closed hip fracture, right, initial encounter (HCC)   Femur fracture (HCC)   Open femur fracture, right (HCC)   Subacute osteomyelitis of right femur (HCC)   Subacute spiral fracture of the right femur -Status post irrigation and debridement of open supracondylar fracture -Patient is bedbound at baseline -Intra-Op wound cultures showing rare Proteus mirabilis -Appreciate infectious disease, switched to Rocephin.  She will need PICC line.  PICC team unable to  place, recommended IR consultation -Orthopedic surgery following for dressing changes  Advanced dementia -Appears to be at or near baseline  CKD stage IIIa -Stable  Hypertension -Continue metoprolol  History of CVA -Continue aspirin  COPD -Without acute exacerbation    DVT prophylaxis:  enoxaparin (LOVENOX) injection 30 mg Start: 09/22/20 0800 Place and maintain sequential compression device Start: 09/21/20 1455  Code Status: Full code Family Communication: None at bedside Disposition Plan:  Status is: Inpatient  Remains inpatient appropriate because:Unsafe d/c plan   Dispo: The patient is from: SNF              Anticipated d/c is to: SNF              Anticipated d/c date is: 1 day              Patient currently is not medically stable to d/c.  Await PICC line placement for extended antibiotics   Difficult to place patient No    Antimicrobials:  Anti-infectives (From admission, onward)   Start     Dose/Rate Route Frequency Ordered Stop   09/24/20 1400  cefTRIAXone (ROCEPHIN) 2 g in sodium chloride 0.9 % 100 mL IVPB        2 g 200 mL/hr over 30 Minutes Intravenous Daily 09/24/20 1253     09/23/20 2000  ceFAZolin (ANCEF) IVPB 2g/100 mL premix  Status:  Discontinued        2 g 200 mL/hr over 30 Minutes Intravenous Every 8 hours 09/23/20 1840 09/24/20 1253   09/22/20 0200  ceFAZolin (ANCEF) IVPB 2g/100 mL premix  2 g 200 mL/hr over 30 Minutes Intravenous Every 8 hours 09/21/20 1950 09/22/20 1825   09/21/20 1745  ceFAZolin (ANCEF) IVPB 2g/100 mL premix        2 g 200 mL/hr over 30 Minutes Intravenous  Once 09/21/20 1650 09/21/20 1732   09/21/20 1652  ceFAZolin (ANCEF) 2-4 GM/100ML-% IVPB       Note to Pharmacy: Marney Doctor   : cabinet override      09/21/20 1652 09/21/20 1732       Objective: Vitals:   09/24/20 0516 09/24/20 1355 09/24/20 1945 09/25/20 0523  BP: 135/84 (!) 142/60 138/78 (!) 149/67  Pulse: 72 77 68 70  Resp: 16 16 16 14   Temp:  98.9 F (37.2 C) 97.6 F (36.4 C) 98.6 F (37 C) 99 F (37.2 C)  TempSrc:  Axillary    SpO2: 96% 98% 96% 98%  Weight:      Height:        Intake/Output Summary (Last 24 hours) at 09/25/2020 1013 Last data filed at 09/25/2020 09/27/2020 Gross per 24 hour  Intake 340 ml  Output 400 ml  Net -60 ml   Filed Weights   09/21/20 0818  Weight: 49 kg    Examination: General exam: Appears with emotional distress getting her blood drawn Respiratory system: Clear to auscultation. Respiratory effort normal. Cardiovascular system: S1 & S2 heard, RRR. No pedal edema. Gastrointestinal system: Abdomen is nondistended, soft and nontender. Normal bowel sounds heard. Central nervous system: Alert  Psychiatry: Judgement and insight appear poor, advanced dementia  Data Reviewed: I have personally reviewed following labs and imaging studies  CBC: Recent Labs  Lab 09/21/20 1414 09/22/20 0308 09/24/20 0308  WBC 9.2 8.3 9.3  NEUTROABS 6.9  --  7.1  HGB 12.6 12.3 11.8*  HCT 38.3 38.0 36.9  MCV 85.5 87.0 88.1  PLT 291 277 300   Basic Metabolic Panel: Recent Labs  Lab 09/21/20 1414 09/22/20 0308 09/24/20 0308  NA 136 133* 134*  K 4.5 4.8 4.9  CL 100 99 99  CO2 28 23 24   GLUCOSE 91 170* 133*  BUN 14 19 24*  CREATININE 0.97 0.89 0.96  CALCIUM 9.8 9.4 9.2   GFR: Estimated Creatinine Clearance: 28.9 mL/min (by C-G formula based on SCr of 0.96 mg/dL). Liver Function Tests: Recent Labs  Lab 09/21/20 1414  AST 23  ALT 20  ALKPHOS 85  BILITOT 1.0  PROT 7.4  ALBUMIN 3.7   No results for input(s): LIPASE, AMYLASE in the last 168 hours. No results for input(s): AMMONIA in the last 168 hours. Coagulation Profile: No results for input(s): INR, PROTIME in the last 168 hours. Cardiac Enzymes: No results for input(s): CKTOTAL, CKMB, CKMBINDEX, TROPONINI in the last 168 hours. BNP (last 3 results) No results for input(s): PROBNP in the last 8760 hours. HbA1C: No results for input(s):  HGBA1C in the last 72 hours. CBG: No results for input(s): GLUCAP in the last 168 hours. Lipid Profile: No results for input(s): CHOL, HDL, LDLCALC, TRIG, CHOLHDL, LDLDIRECT in the last 72 hours. Thyroid Function Tests: No results for input(s): TSH, T4TOTAL, FREET4, T3FREE, THYROIDAB in the last 72 hours. Anemia Panel: No results for input(s): VITAMINB12, FOLATE, FERRITIN, TIBC, IRON, RETICCTPCT in the last 72 hours. Sepsis Labs: No results for input(s): PROCALCITON, LATICACIDVEN in the last 168 hours.  Recent Results (from the past 240 hour(s))  Resp Panel by RT-PCR (Flu A&B, Covid) Nasopharyngeal Swab     Status: None   Collection Time:  09/21/20  2:28 PM   Specimen: Nasopharyngeal Swab; Nasopharyngeal(NP) swabs in vial transport medium  Result Value Ref Range Status   SARS Coronavirus 2 by RT PCR NEGATIVE NEGATIVE Final    Comment: (NOTE) SARS-CoV-2 target nucleic acids are NOT DETECTED.  The SARS-CoV-2 RNA is generally detectable in upper respiratory specimens during the acute phase of infection. The lowest concentration of SARS-CoV-2 viral copies this assay can detect is 138 copies/mL. A negative result does not preclude SARS-Cov-2 infection and should not be used as the sole basis for treatment or other patient management decisions. A negative result may occur with  improper specimen collection/handling, submission of specimen other than nasopharyngeal swab, presence of viral mutation(s) within the areas targeted by this assay, and inadequate number of viral copies(<138 copies/mL). A negative result must be combined with clinical observations, patient history, and epidemiological information. The expected result is Negative.  Fact Sheet for Patients:  BloggerCourse.com  Fact Sheet for Healthcare Providers:  SeriousBroker.it  This test is no t yet approved or cleared by the [place] FDA and  has been authorized for  detection and/or diagnosis of SARS-CoV-2 by FDA under an Emergency Use Authorization (EUA). This EUA will remain  in effect (meaning this test can be used) for the duration of the COVID-19 declaration under Section 564(b)(1) of the Act, 21 U.S.C.section 360bbb-3(b)(1), unless the authorization is terminated  or revoked sooner.       Influenza A by PCR NEGATIVE NEGATIVE Final   Influenza B by PCR NEGATIVE NEGATIVE Final    Comment: (NOTE) The Xpert Xpress SARS-CoV-2/FLU/RSV plus assay is intended as an aid in the diagnosis of influenza from Nasopharyngeal swab specimens and should not be used as a sole basis for treatment. Nasal washings and aspirates are unacceptable for Xpert Xpress SARS-CoV-2/FLU/RSV testing.  Fact Sheet for Patients: BloggerCourse.com  Fact Sheet for Healthcare Providers: SeriousBroker.it  This test is not yet approved or cleared by the [place] FDA and has been authorized for detection and/or diagnosis of SARS-CoV-2 by FDA under an Emergency Use Authorization (EUA). This EUA will remain in effect (meaning this test can be used) for the duration of the COVID-19 declaration under Section 564(b)(1) of the Act, 21 U.S.C. section 360bbb-3(b)(1), unless the authorization is terminated or revoked.  Performed at [facility], [address] [place], [place] [address]   Aerobic/Anaerobic Culture (surgical/deep wound)     Status: None (Preliminary result)   Collection Time: 09/21/20  5:33 PM   Specimen: PATH Bone biopsy; Tissue  Result Value Ref Range Status   Specimen Description   Final    BONE RIGHT FEMUR Performed at [facility], [address] [place], [place] [address]    Special Requests   Final    NONE Performed at [facility], [address] [place], [place] [address]    Gram Stain   Final    RARE WBC PRESENT, PREDOMINANTLY  MONONUCLEAR NO ORGANISMS SEEN Performed at [facility], [address], [place], [place] [address]    Culture   Final    RARE PROTEUS MIRABILIS CRITICAL RESULT CALLED TO, READ BACK BY AND VERIFIED WITH: B,BORING RN @1724  09/24/20 EB NO ANAEROBES ISOLATED; CULTURE IN PROGRESS FOR 5 DAYS    Report Status PENDING  Incomplete   Organism ID, Bacteria PROTEUS MIRABILIS  Final      Susceptibility   Proteus mirabilis - MIC*    AMPICILLIN <=2 SENSITIVE Sensitive  CEFAZOLIN <=4 SENSITIVE Sensitive     CEFEPIME <=0.12 SENSITIVE Sensitive     CEFTAZIDIME <=1 SENSITIVE Sensitive     CEFTRIAXONE <=0.25 SENSITIVE Sensitive     CIPROFLOXACIN >=4 RESISTANT Resistant     GENTAMICIN >=16 RESISTANT Resistant     IMIPENEM 2 SENSITIVE Sensitive     TRIMETH/SULFA >=320 RESISTANT Resistant     AMPICILLIN/SULBACTAM <=2 SENSITIVE Sensitive     PIP/TAZO <=4 SENSITIVE Sensitive     * RARE PROTEUS MIRABILIS      Radiology Studies: [place] EKG SITE RITE  Result Date: 09/24/2020 If Site Rite image not attached, placement could not be confirmed due to current cardiac rhythm.     Scheduled Meds: . aspirin  81 mg Oral Daily  . docusate sodium  100 mg Oral BID  . enoxaparin (LOVENOX) injection  30 mg Subcutaneous Q24H  . feeding supplement  1 Container Oral Q24H  . feeding supplement  237 mL Oral Q24H  . metoprolol tartrate  12.5 mg Oral BID  . multivitamin with minerals  1 tablet Oral Daily  . pantoprazole  40 mg Oral Daily  . senna  2 tablet Oral QHS  . sucralfate  1 g Oral TID WC & HS   Continuous Infusions: . cefTRIAXone (ROCEPHIN)  IV Stopped (09/24/20 1341)     LOS: 4 days      Time spent: 10 minutes   Noralee Stain, DO [facility] 09/25/2020, 10:13 AM   Available via Epic secure chat 7am-7pm After these hours, please refer to coverage provider listed on [email or web address]

## 2020-09-25 NOTE — Progress Notes (Signed)
[facility]    Date of Admission:  09/21/2020   Total days of antibiotics 5           ID: Roberta Griffin is a 87 y.o. female with probable right knee pji/bursitis s/p I X D on 2/9 Principal Problem:   Open displaced supracondylar fracture without intracondylar extension of lower end of right femur, type IIIA, IIIB, or IIIC, with malunion Active Problems:   CVA (cerebral vascular accident) (HCC)   COPD (chronic obstructive pulmonary disease) (HCC)   Alzheimer's dementia without behavioral disturbance (HCC)   CKD (chronic kidney disease) stage 3, GFR 30-59 ml/min (HCC)   Closed hip fracture, right, initial encounter (HCC)   Femur fracture (HCC)   Open femur fracture, right (HCC)   Subacute osteomyelitis of right femur (HCC)    Subjective: Afebrile but remains confused but intermittently tearful  ROS: unable to obtain due to dementia  Medications:  . aspirin  81 mg Oral Daily  . docusate sodium  100 mg Oral BID  . enoxaparin (LOVENOX) injection  30 mg Subcutaneous Q24H  . feeding supplement  1 Container Oral Q24H  . feeding supplement  237 mL Oral Q24H  . metoprolol tartrate  12.5 mg Oral BID  . multivitamin with minerals  1 tablet Oral Daily  . pantoprazole  40 mg Oral Daily  . senna  2 tablet Oral QHS  . sucralfate  1 g Oral TID WC & HS    Objective: Vital signs in last 24 hours: Temp:  [98.4 F (36.9 C)-99 F (37.2 C)] 98.4 F (36.9 C) (02/13 1309) Pulse Rate:  [68-77] 77 (02/13 1309) Resp:  [14-16] 14 (02/13 1309) BP: (122-149)/(67-88) 122/88 (02/13 1309) SpO2:  [96 %-99 %] 99 % (02/13 1309) Physical Exam  Constitutional:  oriented to person, place, and time. appears well-developed and well-nourished. No distress.  HENT: [place]/AT, PERRLA, no scleral icterus Mouth/Throat: Oropharynx is clear and moist. No oropharyngeal exudate.  Cardiovascular: Normal rate, regular rhythm and normal heart sounds. Exam reveals no gallop and no friction  rub.  No murmur heard.  Pulmonary/Chest: Effort normal and breath sounds normal. No respiratory distress.  has no wheezes.  Neck = supple, no nuchal rigidity Abdominal: Soft. Bowel sounds are normal.  exhibits no distension. There is no tenderness.  Lymphadenopathy: no cervical adenopathy. No axillary adenopathy Neurological: alert and oriented to person, place, and time.  OFB:PZWCHENIDPO, right knee is wrapped Skin: Skin is warm and dry. No rash noted. No erythema.  Psychiatric: a normal mood and affect.  behavior is normal.   Lab Results Recent Labs    09/24/20 0308  WBC 9.3  HGB 11.8*  HCT 36.9  NA 134*  K 4.9  CL 99  CO2 24  BUN 24*  CREATININE 0.96   Liver Panel No results for input(s): PROT, ALBUMIN, AST, ALT, ALKPHOS, BILITOT, BILIDIR, IBILI in the last 72 hours. Sedimentation Rate Recent Labs    09/25/20 0816  ESRSEDRATE 55*   C-Reactive Protein Recent Labs    09/24/20 0308 09/25/20 0816  CRP 4.2* 3.7*    Microbiology: Proteus mirabilis      MIC    AMPICILLIN <=2 SENSITIVE  Sensitive    AMPICILLIN/SULBACTAM <=2 SENSITIVE  Sensitive    CEFAZOLIN <=4 SENSITIVE  Sensitive    CEFEPIME <=0.12 SENS... Sensitive    CEFTAZIDIME <=1 SENSITIVE  Sensitive    CEFTRIAXONE <=0.25 SENS... Sensitive    CIPROFLOXACIN >=4 RESISTANT  Resistant    GENTAMICIN >=  16 RESIST... Resistant    IMIPENEM 2 SENSITIVE  Sensitive    PIP/TAZO <=4 SENSITIVE  Sensitive    TRIMETH/SULFA >=320 RESIS... Resistant     Studies/Results:   Assessment/Plan: Proteus bursitis/probable pji 2/2 open displaced supracondyle femur fracture = recommend to treat with ceftriaxone 2gm IV daily via central line (to be placed by IR) for 6 wk using 2/10 as day 1.   Dementia = unclear if this is her baseline, maybe worth discussing with family.  [facility] for Infectious Diseases Cell: [phone] Pager: [phone]  09/25/2020, 4:00 PM

## 2020-09-26 ENCOUNTER — Inpatient Hospital Stay (HOSPITAL_COMMUNITY): Payer: Medicare (Managed Care)

## 2020-09-26 DIAGNOSIS — G309 Alzheimer's disease, unspecified: Secondary | ICD-10-CM

## 2020-09-26 DIAGNOSIS — S72451R Displaced supracondylar fracture without intracondylar extension of lower end of right femur, subsequent encounter for open fracture type IIIA, IIIB, or IIIC with malunion: Secondary | ICD-10-CM

## 2020-09-26 DIAGNOSIS — F0280 Dementia in other diseases classified elsewhere without behavioral disturbance: Secondary | ICD-10-CM | POA: Diagnosis not present

## 2020-09-26 DIAGNOSIS — T8453XA Infection and inflammatory reaction due to internal right knee prosthesis, initial encounter: Secondary | ICD-10-CM

## 2020-09-26 MED ORDER — LIDOCAINE HCL 1 % IJ SOLN
INTRAMUSCULAR | Status: AC | PRN
  Administered 2020-09-26: 5 mL via INTRADERMAL

## 2020-09-26 MED ORDER — CHLORHEXIDINE GLUCONATE CLOTH 2 % EX PADS: 6.0000 | MEDICATED_PAD | Freq: Every day | CUTANEOUS | Status: DC

## 2020-09-26 MED ORDER — CEFTRIAXONE IV (FOR PTA / DISCHARGE USE ONLY): 2.0000 g | INTRAVENOUS | 0 refills | Status: AC

## 2020-09-26 MED ORDER — LIDOCAINE HCL 1 % IJ SOLN
INTRAMUSCULAR | Status: AC
  Filled 2020-09-26: qty 20

## 2020-09-26 MED ORDER — HEPARIN SOD (PORK) LOCK FLUSH 100 UNIT/ML IV SOLN
250.0000 [IU] | INTRAVENOUS | Status: AC | PRN
  Administered 2020-09-26: 250 [IU]
  Filled 2020-09-26: qty 2.5

## 2020-09-26 MED ORDER — IOHEXOL 300 MG/ML  SOLN: 50.0000 mL | Freq: Once | INTRAMUSCULAR | Status: DC | PRN

## 2020-09-26 NOTE — Progress Notes (Signed)
[facility]  Date of Admission:  09/21/2020           Reason for visit: Follow up on probable right knee P JI/bursitis   ASSESSMENT:    Probable right knee Proteus P JI/bursitis secondary to open displaced supracondylar femur fracture-status post I&D 2/9 Dementia  PLAN:    Continue ceftriaxone 2 g IV daily via central line to be placed by IR Recommend 6 weeks total from February 10 with end date March 24 We will sign off for now, please see OPAT note below   Diagnosis: Proteus P JI/bursitis  Culture Result: Proteus mirabilis  Allergies  Allergen Reactions  . Tramadol Other (See Comments)    twitching    OPAT Orders Discharge antibiotics to be given via PICC line Discharge antibiotics: Ceftriaxone 2 g daily Per pharmacy protocol   Duration: 6 weeks End Date: November 03, 2020  [facility] Care Per Protocol:  Home health RN for IV administration and teaching; PICC line care and labs.    Labs weekly while on IV antibiotics: _x_ CBC with differential __ BMP _x_ CMP _x_ CRP _x_ ESR __ Vancomycin trough __ CK  _x_ Please pull PIC at completion of IV antibiotics __ Please leave PIC in place until doctor has seen patient or been notified  Fax weekly labs to [phone]  Clinic Follow Up Appt: 10/24/20 @ 845am with Dr Baxter Flattery   SUBJECTIVE:   Patient resting in bed.  Does not voice new complaints.  Review of Systems  Unable to perform ROS: Dementia     OBJECTIVE:   Blood pressure (!) 146/83, pulse 66, temperature 98.4 F (36.9 C), temperature source Oral, resp. rate (!) 22, height 5' 5" (1.651 m), weight 49 kg, SpO2 100 %. Body mass index is 17.98 kg/m.  Physical Exam Constitutional:      Comments: Elderly woman lying in bed, no acute distress  Pulmonary:     Effort: Pulmonary effort is normal. No respiratory distress.  Musculoskeletal:     Comments: Right knee is wrapped  Neurological:     General: No focal deficit  present.      Lab Results: Lab Results  Component Value Date   WBC 9.3 09/24/2020   HGB 11.8 (L) 09/24/2020   HCT 36.9 09/24/2020   MCV 88.1 09/24/2020   PLT 300 09/24/2020    Lab Results  Component Value Date   NA 134 (L) 09/24/2020   K 4.9 09/24/2020   CO2 24 09/24/2020   GLUCOSE 133 (H) 09/24/2020   BUN 24 (H) 09/24/2020   CREATININE 0.96 09/24/2020   CALCIUM 9.2 09/24/2020   GFRNONAA 56 (L) 09/24/2020   GFRAA 54 (L) 04/01/2019    Lab Results  Component Value Date   ALT 20 09/21/2020   AST 23 09/21/2020   ALKPHOS 85 09/21/2020   BILITOT 1.0 09/21/2020       Component Value Date/Time   CRP 3.7 (H) 09/25/2020 0816       Component Value Date/Time   ESRSEDRATE 55 (H) 09/25/2020 0816     I have reviewed the micro and lab results in Epic.  Imaging: [place] EKG SITE RITE  Result Date: 09/24/2020 If Site Rite image not attached, placement could not be confirmed due to current cardiac rhythm.      [facility] High[facility] for Infectious Disease [facility] [phone] pager 09/26/2020, 10:12 AM   I spent greater than 35 minutes with the patient  including greater than 50% of time in face to face counsel of the patient and in coordination of their care.

## 2020-09-26 NOTE — Progress Notes (Signed)
RN has attempted to call report numerous times. On the first call, a [place] staff member answered the phone and transferred call to a voicemail. Voicemail left but called back several times to see if there was another extension with a person I could speak to but with every call back I was placed immediately on hold and transferred to same voicemail without anyone answering phone.   Patient discharged to [facility]. All belongings w/ patient. PICC hep locked. Transferred via [facility]AR.

## 2020-09-26 NOTE — Discharge Summary (Addendum)
Physician Discharge Summary  Roberta Griffin RSW:[id number] DOB: 29-Feb-1926 DOA: 09/21/2020  PCP: Jodi Marble, MD  Admit date: 09/21/2020 Discharge date: 09/26/2020  Admitted From: SNF Disposition:  SNF   Recommendations for Outpatient Follow-up:  1. Follow up with Dr. Louanne Skye orthopedic surgery in 1 week for wound check  2. Recommend outpatient palliative care referral to discuss goals of care with family 3. Recommend 6 weeks antibiotic treatment from February 10 with end date March 24  Discharge Condition: Stable CODE STATUS: Full  Diet recommendation: Regular diet   Brief/Interim Summary: Roberta Griffin is a 87 year old female with past medical history of COPD,dementia,CKD 3b,hypertension,CVA,bilateral hip arthroplasty who [facility] for an unknown period of time. Per granddaughter over the phone, the staff at the facility were trying to move her at some point last week and she hit her leg and initially thought she only had a superficial abrasion.Right knee Xray revealed subacute long spiral fracture of the distal right femur extending form the shaft to the total knee hardware, one full shaft width posterior displacement with overriding of at least 6 cm and medial angulation.She underwent irrigation and debridement of open right distal supracondylar femur fracture on 2/9. Due to the nature of open fracture and proteus in culture, patient was seen by ID and rec for total 6 week IV antibiotic therapy.   Discharge Diagnoses:  Principal Problem:   Open displaced supracondylar fracture without intracondylar extension of lower end of right femur, type IIIA, IIIB, or IIIC, with malunion Active Problems:   CVA (cerebral vascular accident) ([place])   COPD (chronic obstructive pulmonary disease) ([place])   Alzheimer's dementia without behavioral disturbance (HCC)   CKD (chronic kidney disease) stage 3, GFR 30-59 ml/min (HCC)    Closed hip fracture, right, initial encounter ([place])   Femur fracture (HCC)   Open femur fracture, right ([place])   Subacute osteomyelitis of right femur (HCC)   Open subacute spiral fracture of the right femur -Status post irrigation and debridement of open supracondylar fracture -Patient is bedbound at baseline -Intra-Op wound cultures showing Proteus mirabilis -Appreciate infectious disease, recommended for 6 weeks of IV ceftriaxone 2/10-3/24. PICC placed 2/14  -Follow up with Dr. Louanne Skye   Advanced dementia -Appears to be at or near baseline  CKD stage IIIa -Stable  Hypertension -Continue metoprolol  History of CVA -Continue aspirin  COPD -Without acute exacerbation    In agreement with assessment of the pressure ulcer as below:  Pressure Injury 09/26/20 Heel Left Deep Tissue Pressure Injury - Purple or maroon localized area of discolored intact skin or blood-filled blister due to damage of underlying soft tissue from pressure and/or shear. (Active)  09/26/20   Location: Heel  Location Orientation: Left  Staging: Deep Tissue Pressure Injury - Purple or maroon localized area of discolored intact skin or blood-filled blister due to damage of underlying soft tissue from pressure and/or shear.  Wound Description (Comments):   Present on Admission: No    Nutrition Problem: Increased nutrient needs Etiology: acute illness  Discharge Instructions  Discharge Instructions    [facility] pharmacist to adjust dose for Vancomycin, Aminoglycosides and other anti-infective therapies as requested by physician.   Complete by: As directed    [facility] to provide Cath Flo 56m   Complete by: As directed    Administer for PICC line occlusion and as ordered by physician for other access device issues.   Anaphylaxis Kit: Provided to  treat any anaphylactic reaction to the medication being provided to the patient if First Dose or when requested by physician    Complete by: As directed    Epinephrine 71m/ml vial / amp: Administer 0.355m(0.65m48msubcutaneously once for moderate to severe anaphylaxis, nurse to call physician and pharmacy when reaction occurs and call 911 if needed for immediate care   Diphenhydramine 14m865m IV vial: Administer 25-14mg26mIM PRN for first dose reaction, rash, itching, mild reaction, nurse to call physician and pharmacy when reaction occurs   Sodium Chloride 0.9% NS 500ml 765mAdminister if needed for hypovolemic blood pressure drop or as ordered by physician after call to physician with anaphylactic reaction   Change dressing on IV access line weekly and PRN   Complete by: As directed    Discharge wound care:   Complete by: As directed    Foam dressing to bilat heels and sacrum, change Q 3 days or PRN soiling   Flush IV access with Sodium Chloride 0.9% and Heparin 10 units/ml or 100 units/ml   Complete by: As directed    Home infusion instructions - [facility]   Complete by: As directed    Instructions: Flush IV access with Sodium Chloride 0.9% and Heparin 10units/ml or 100units/ml   Change dressing on IV access line: Weekly and PRN   Instructions Cath Flo 2mg: A34mnister for PICC Line occlusion and as ordered by physician for other access device   [facility] pharmacist to adjust dose for: Vancomycin, Aminoglycosides and other anti-infective therapies as requested by physician   Leave dressing on - Keep it clean, dry, and intact until clinic visit   Complete by: As directed    Method of administration may be changed at the discretion of home infusion pharmacist based upon assessment of the patient and/or caregiver's ability to self-administer the medication ordered   Complete by: As directed      Allergies as of 09/26/2020      Reactions   Tramadol Other (See Comments)   twitching      Medication List    TAKE these medications   acetaminophen 500 MG tablet Commonly known as:  TYLENOL Take 1 tablet (500 mg total) by mouth every 6 (six) hours as needed for mild pain. What changed:   how much to take  when to take this   aspirin 81 MG chewable tablet Chew 1 tablet (81 mg total) by mouth daily.   bisacodyl 5 MG EC tablet Commonly known as: DULCOLAX Take 1 tablet (5 mg total) by mouth daily as needed for moderate constipation.   cefTRIAXone  IVPB Commonly known as: ROCEPHIN Inject 2 g into the vein daily. Indication: PJI/bursitis First Dose: No Last Day of Therapy:  11/03/2020 Labs - Once weekly:  CBC/D and BMP, Labs - Every other week:  ESR and CRP Method of administration: IV Push Method of administration may be changed at the discretion of home infusion pharmacist based upon assessment of the patient and/or caregiver's ability to self-administer the medication ordered.   docusate sodium 100 MG capsule Commonly known as: COLACE Take 1 capsule (100 mg total) by mouth 2 (two) times daily.   Ensure Take 237 mLs by mouth 2 (two) times daily between meals.   melatonin 3 MG Tabs tablet Take 3 mg by mouth at bedtime.   metoprolol tartrate 25 MG tablet Commonly known as: LOPRESSOR Take 0.5 tablets (12.5 mg total) by mouth 2 (two) times daily.   multivitamin with minerals Tabs  tablet Take 1 tablet by mouth daily.   omeprazole 20 MG capsule Commonly known as: PRILOSEC Take 20 mg by mouth daily.   polyethylene glycol 17 g packet Commonly known as: MiraLax Take 17 g by mouth daily as needed for moderate constipation. What changed:   when to take this  additional instructions   PROTEIN PO Take 30 mLs by mouth daily.   senna 8.6 MG Tabs tablet Commonly known as: SENOKOT Take 2 tablets by mouth at bedtime.   sucralfate 1 g tablet Commonly known as: CARAFATE Take 1 g by mouth 4 (four) times daily -  with meals and at bedtime.   vitamin C 500 MG tablet Commonly known as: ASCORBIC ACID Take 500 mg by mouth daily.   zinc sulfate 220 (50 Zn)  MG capsule Take 220 mg by mouth daily.            Discharge Care Instructions  (From admission, onward)         Start     Ordered   09/26/20 0000  Change dressing on IV access line weekly and PRN  (Home infusion instructions - [facility] )        09/26/20 1405   09/26/20 0000  Leave dressing on - Keep it clean, dry, and intact until clinic visit        09/26/20 1405   09/26/20 0000  Discharge wound care:       Comments: Foam dressing to bilat heels and sacrum, change Q 3 days or PRN soiling   09/26/20 1405          Follow-up Information    Jessy Oto, MD In 1 week.   Specialty: Orthopedic Surgery Why: For wound re-check Contact information: [address] [place] [address] [phone]              Allergies  Allergen Reactions  . Tramadol Other (See Comments)    twitching    Consultations:  Orthopedic surgery  ID    Procedures/Studies: DG Knee Complete 4 Views Right  Result Date: 09/21/2020 CLINICAL DATA:  87 year old female with pain, suspected recent fall. EXAM: RIGHT KNEE - COMPLETE 4+ VIEW COMPARISON:  Right hip series 04/07/2019. FINDINGS: Bilateral total knee arthroplasties are visible on these images. There is a long segment spiral fracture of the distal right femur extending at least 12 cm, with posterior displacement 1 full shaft width, over riding by 6 or 7 cm, and medial angulation. Comminution. There is evidence of some periosteal new bone formation about the fracture margins. The right knee hardware remains intact. The proximal femur fragment may be mildly impacted upon the right patella. Proximal right tibia and fibula appear intact. Calcified peripheral vascular disease. IMPRESSION: Subacute appearing long spiral fracture of the distal right femur extending from the shaft to the total knee hardware. One full shaft width posterior displacement with over riding of at least 6 cm, and medial angulation. Electronically Signed    By: Genevie Ann M.D.   On: 09/21/2020 09:58   [place] EKG SITE RITE  Result Date: 09/24/2020 [facility] image not attached, placement could not be confirmed due to current cardiac rhythm.      Discharge Exam: Vitals:   09/25/20 2144 09/26/20 0522  BP: (!) 144/90 (!) 146/83  Pulse: 77 66  Resp: 20 (!) 22  Temp: (!) 97.5 F (36.4 C) 98.4 F (36.9 C)  SpO2: 98% 100%    General: Pt is alert, awake, not in acute  distress Cardiovascular: RRR, S1/S2 +, no edema Respiratory: CTA bilaterally, no wheezing, no rhonchi, no respiratory distress, no conversational dyspnea  Abdominal: Soft, NT, ND, bowel sounds + Extremities: no edema, no cyanosis Psych: Dementia    The results of significant diagnostics from this hospitalization (including imaging, microbiology, ancillary and laboratory) are listed below for reference.     Microbiology: Recent Results (from the past 240 hour(s))  Resp Panel by RT-PCR (Flu A&B, Covid) Nasopharyngeal Swab     Status: None   Collection Time: 09/21/20  2:28 PM   Specimen: Nasopharyngeal Swab; Nasopharyngeal(NP) swabs in vial transport medium  Result Value Ref Range Status   SARS Coronavirus 2 by RT PCR NEGATIVE NEGATIVE Final    Comment: (NOTE) SARS-CoV-2 target nucleic acids are NOT DETECTED.  The SARS-CoV-2 RNA is generally detectable in upper respiratory specimens during the acute phase of infection. The lowest concentration of SARS-CoV-2 viral copies this assay can detect is 138 copies/mL. A negative result does not preclude SARS-Cov-2 infection and should not be used as the sole basis for treatment or other patient management decisions. A negative result may occur with  improper specimen collection/handling, submission of specimen other than nasopharyngeal swab, presence of viral mutation(s) within the areas targeted by this assay, and inadequate number of viral copies(<138 copies/mL). A negative result must be combined with clinical observations,  patient history, and epidemiological information. The expected result is Negative.  Fact Sheet for Patients:  EntrepreneurPulse.com.au  Fact Sheet for Healthcare Providers:  IncredibleEmployment.be  This test is no t yet approved or cleared by the [place] FDA and  has been authorized for detection and/or diagnosis of SARS-CoV-2 by FDA under an Emergency Use Authorization (EUA). This EUA will remain  in effect (meaning this test can be used) for the duration of the COVID-19 declaration under Section 564(b)(1) of the Act, 21 U.S.C.section 360bbb-3(b)(1), unless the authorization is terminated  or revoked sooner.       Influenza A by PCR NEGATIVE NEGATIVE Final   Influenza B by PCR NEGATIVE NEGATIVE Final    Comment: (NOTE) The Xpert Xpress SARS-CoV-2/FLU/RSV plus assay is intended as an aid in the diagnosis of influenza from Nasopharyngeal swab specimens and should not be used as a sole basis for treatment. Nasal washings and aspirates are unacceptable for Xpert Xpress SARS-CoV-2/FLU/RSV testing.  Fact Sheet for Patients: EntrepreneurPulse.com.au  Fact Sheet for Healthcare Providers: IncredibleEmployment.be  This test is not yet approved or cleared by the [place] FDA and has been authorized for detection and/or diagnosis of SARS-CoV-2 by FDA under an Emergency Use Authorization (EUA). This EUA will remain in effect (meaning this test can be used) for the duration of the COVID-19 declaration under Section 564(b)(1) of the Act, 21 U.S.C. section 360bbb-3(b)(1), unless the authorization is terminated or revoked.  Performed at [facility], [place] [address], [place], [place] [address]   Aerobic/Anaerobic Culture (surgical/deep wound)     Status: None (Preliminary result)   Collection Time: 09/21/20  5:33 PM   Specimen: PATH Bone biopsy; Tissue  Result Value Ref Range Status    Specimen Description   Final    BONE RIGHT FEMUR Performed at [facility] [address], [place], [place] [address]    Special Requests   Final    NONE Performed at [facility], [place] [address] [place], [place] [address]    Gram Stain   Final    RARE WBC PRESENT, PREDOMINANTLY MONONUCLEAR NO ORGANISMS SEEN  Performed at [facility], [place] [address], [place], [place] [address]    Culture   Final    RARE PROTEUS MIRABILIS CRITICAL RESULT CALLED TO, READ BACK BY AND VERIFIED WITH: B,BORING RN _0  09/24/20 EB NO ANAEROBES ISOLATED; CULTURE IN PROGRESS FOR 5 DAYS    Report Status PENDING  Incomplete   Organism ID, Bacteria PROTEUS MIRABILIS  Final      Susceptibility   Proteus mirabilis - MIC*    AMPICILLIN <=2 SENSITIVE Sensitive     CEFAZOLIN <=4 SENSITIVE Sensitive     CEFEPIME <=0.12 SENSITIVE Sensitive     CEFTAZIDIME <=1 SENSITIVE Sensitive     CEFTRIAXONE <=0.25 SENSITIVE Sensitive     CIPROFLOXACIN >=4 RESISTANT Resistant     GENTAMICIN >=16 RESISTANT Resistant     IMIPENEM 2 SENSITIVE Sensitive     TRIMETH/SULFA >=320 RESISTANT Resistant     AMPICILLIN/SULBACTAM <=2 SENSITIVE Sensitive     PIP/TAZO <=4 SENSITIVE Sensitive     * RARE PROTEUS MIRABILIS     Labs: BNP (last 3 results) No results for input(s): BNP in the last 8760 hours. Basic Metabolic Panel: Recent Labs  Lab 09/21/20 1414 09/22/20 0308 09/24/20 0308  NA 136 133* 134*  K 4.5 4.8 4.9  CL 100 99 99  CO2 _1 GLUCOSE 91 170* 133*  BUN 14 19 24*  CREATININE 0.97 0.89 0.96  CALCIUM 9.8 9.4 9.2   Liver Function Tests: Recent Labs  Lab 09/21/20 1414  AST 23  ALT 20  ALKPHOS 85  BILITOT 1.0  PROT 7.4  ALBUMIN 3.7   No results for input(s): LIPASE, AMYLASE in the last 168 hours. No results for input(s): AMMONIA in the last 168 hours. CBC: Recent Labs  Lab 09/21/20 1414 09/22/20 0308 09/24/20 0308  WBC 9.2 8.3 9.3  NEUTROABS 6.9  --   7.1  HGB 12.6 12.3 11.8*  HCT 38.3 38.0 36.9  MCV 85.5 87.0 88.1  PLT 291 277 300   Cardiac Enzymes: No results for input(s): CKTOTAL, CKMB, CKMBINDEX, TROPONINI in the last 168 hours. BNP: Invalid input(s): POCBNP CBG: No results for input(s): GLUCAP in the last 168 hours. D-Dimer No results for input(s): DDIMER in the last 72 hours. Hgb A1c No results for input(s): HGBA1C in the last 72 hours. Lipid Profile No results for input(s): CHOL, HDL, LDLCALC, TRIG, CHOLHDL, LDLDIRECT in the last 72 hours. Thyroid function studies No results for input(s): TSH, T4TOTAL, T3FREE, THYROIDAB in the last 72 hours.  Invalid input(s): FREET3 Anemia work up No results for input(s): VITAMINB12, FOLATE, FERRITIN, TIBC, IRON, RETICCTPCT in the last 72 hours. Urinalysis    Component Value Date/Time   COLORURINE YELLOW 10/12/2018 0015   APPEARANCEUR CLEAR 10/12/2018 0015   LABSPEC 1.011 10/12/2018 0015   PHURINE 6.0 10/12/2018 0015   GLUCOSEU NEGATIVE 10/12/2018 0015   HGBUR NEGATIVE 10/12/2018 0015   BILIRUBINUR NEGATIVE 10/12/2018 0015   KETONESUR NEGATIVE 10/12/2018 0015   PROTEINUR NEGATIVE 10/12/2018 0015   NITRITE NEGATIVE 10/12/2018 0015   LEUKOCYTESUR NEGATIVE 10/12/2018 0015   Sepsis Labs Invalid input(s): PROCALCITONIN,  WBC,  LACTICIDVEN Microbiology Recent Results (from the past 240 hour(s))  Resp Panel by RT-PCR (Flu A&B, Covid) Nasopharyngeal Swab     Status: None   Collection Time: 09/21/20  2:28 PM   Specimen: Nasopharyngeal Swab; Nasopharyngeal(NP) swabs in vial transport medium  Result Value Ref Range Status   SARS Coronavirus 2 by RT PCR NEGATIVE NEGATIVE Final    Comment: (  NOTE) SARS-CoV-2 target nucleic acids are NOT DETECTED.  The SARS-CoV-2 RNA is generally detectable in upper respiratory specimens during the acute phase of infection. The lowest concentration of SARS-CoV-2 viral copies this assay can detect is 138 copies/mL. A negative result does not preclude  SARS-Cov-2 infection and should not be used as the sole basis for treatment or other patient management decisions. A negative result may occur with  improper specimen collection/handling, submission of specimen other than nasopharyngeal swab, presence of viral mutation(s) within the areas targeted by this assay, and inadequate number of viral copies(<138 copies/mL). A negative result must be combined with clinical observations, patient history, and epidemiological information. The expected result is Negative.  Fact Sheet for Patients:  EntrepreneurPulse.com.au  Fact Sheet for Healthcare Providers:  IncredibleEmployment.be  This test is no t yet approved or cleared by the [place] FDA and  has been authorized for detection and/or diagnosis of SARS-CoV-2 by FDA under an Emergency Use Authorization (EUA). This EUA will remain  in effect (meaning this test can be used) for the duration of the COVID-19 declaration under Section 564(b)(1) of the Act, 21 U.S.C.section 360bbb-3(b)(1), unless the authorization is terminated  or revoked sooner.       Influenza A by PCR NEGATIVE NEGATIVE Final   Influenza B by PCR NEGATIVE NEGATIVE Final    Comment: (NOTE) The Xpert Xpress SARS-CoV-2/FLU/RSV plus assay is intended as an aid in the diagnosis of influenza from Nasopharyngeal swab specimens and should not be used as a sole basis for treatment. Nasal washings and aspirates are unacceptable for Xpert Xpress SARS-CoV-2/FLU/RSV testing.  Fact Sheet for Patients: EntrepreneurPulse.com.au  Fact Sheet for Healthcare Providers: IncredibleEmployment.be  This test is not yet approved or cleared by the [place] FDA and has been authorized for detection and/or diagnosis of SARS-CoV-2 by FDA under an Emergency Use Authorization (EUA). This EUA will remain in effect (meaning this test can be used) for the duration of  the COVID-19 declaration under Section 564(b)(1) of the Act, 21 U.S.C. section 360bbb-3(b)(1), unless the authorization is terminated or revoked.  Performed at [facility], [place] [address], [place], [place] [address]   Aerobic/Anaerobic Culture (surgical/deep wound)     Status: None (Preliminary result)   Collection Time: 09/21/20  5:33 PM   Specimen: PATH Bone biopsy; Tissue  Result Value Ref Range Status   Specimen Description   Final    BONE RIGHT FEMUR Performed at [facility] [address], [place], [place] [address]    Special Requests   Final    NONE Performed at [facility], [place] [address], [place], [place] [address]    Gram Stain   Final    RARE WBC PRESENT, PREDOMINANTLY MONONUCLEAR NO ORGANISMS SEEN Performed at [facility], [place] [address] [place], [place] [address]    Culture   Final    RARE PROTEUS MIRABILIS CRITICAL RESULT CALLED TO, READ BACK BY AND VERIFIED WITH: B,BORING RN _0  09/24/20 EB NO ANAEROBES ISOLATED; CULTURE IN PROGRESS FOR 5 DAYS    Report Status PENDING  Incomplete   Organism ID, Bacteria PROTEUS MIRABILIS  Final      Susceptibility   Proteus mirabilis - MIC*    AMPICILLIN <=2 SENSITIVE Sensitive     CEFAZOLIN <=4 SENSITIVE Sensitive     CEFEPIME <=0.12 SENSITIVE Sensitive     CEFTAZIDIME <=1 SENSITIVE Sensitive     CEFTRIAXONE <=0.25 SENSITIVE Sensitive     CIPROFLOXACIN >=4 RESISTANT  Resistant     GENTAMICIN >=16 RESISTANT Resistant     IMIPENEM 2 SENSITIVE Sensitive     TRIMETH/SULFA >=320 RESISTANT Resistant     AMPICILLIN/SULBACTAM <=2 SENSITIVE Sensitive     PIP/TAZO <=4 SENSITIVE Sensitive     * RARE PROTEUS MIRABILIS     Patient was seen and examined on the day of discharge and was found to be in stable condition. Time coordinating discharge: 35 minutes including assessment and coordination of care, as well as examination of the patient.    SIGNED:  Dessa Phi, DO [facility] 09/26/2020, 2:06 PM

## 2020-09-26 NOTE — Care Management Important Message (Signed)
Important Message  Patient Details IM Letter placed in Patient's room. Name: Roberta Griffin MRN: [id number] Date of Birth: 12-30-1925   Medicare Important Message Given:  Yes     Caren Macadam 09/26/2020, 12:58 PM

## 2020-09-26 NOTE — Progress Notes (Signed)
MEDICATION RELATED CONSULT NOTE  Post IR Procedure Consult - Anticoagulant/Antiplatelet PTA/Inpatient Med List Review by Pharmacist  Allergies  Allergen Reactions  . Tramadol Other (See Comments)    twitching    Procedure:  IR PICC Placement Right  Completed: 1411  Post-Procedural bleeding risk per IR MD assessment: standard  Antithrombotic medications on inpatient or PTA profile prior to procedure: Enoxaparin 30 mg subq every 24 hours  Recommended restart time per IR Post-Procedure Guidelines: Day + 1 (Next AM)  Plan: Resume enoxaparin 30 mg subq every 24 hours tomorrow morning Monitor daily CBC, s/s bleeding  Travious Vanover P. Casimiro Needle, PharmD, BCPS Clinical Pharmacist [place] Please utilize Amion for appropriate phone number to reach the unit pharmacist [facility]) 09/26/2020 3:46 PM

## 2020-09-26 NOTE — Progress Notes (Signed)
PHARMACY CONSULT NOTE FOR:  OUTPATIENT  PARENTERAL ANTIBIOTIC THERAPY (OPAT)  Indication: PJI/bursitis Regimen: ceftriaxone 2g IV daily  End date: 11/03/2020  IV antibiotic discharge orders are pended. To discharging provider:  please sign these orders via discharge navigator,  Select New Orders & click on the button choice - Manage This Unsigned Work.     Thank you for allowing pharmacy to be a part of this patient's care.  Jeannetta Nap 09/26/2020, 10:17 AM

## 2020-09-26 NOTE — TOC Transition Note (Signed)
Transition of Care [facility]) - CM/SW Discharge Note   Patient Details  Name: Roberta Griffin MRN: [id number] Date of Birth: November 24, 1925  Transition of Care [facility]) CM/SW Contact:  Clearance Coots, LCSW Phone Number: 09/26/2020, 2:14 PM   Clinical Narrative:    CSW confirm SNF- [place] will accept the patient today.  Facility reports they will provide a covid test upon arrival to avoid discharge delay.  Nurse call report to: [phone] Ask for [place].  Room:211B [facility] to transport.   Final next level of care: Skilled Nursing Facility Barriers to Discharge: Barriers Resolved   Patient Goals and CMS Choice       Discharge Placement              Patient chooses bed at:  [facility]) Patient to be transferred to facility by: [facility] Name of family member notified: Rocky Link   [phone] Patient and family notified of of transfer: 09/26/20  Discharge Plan and Services In-house Referral: Clinical Social Work   Post Acute Care Choice: Skilled Nursing Facility                               Social Determinants of Health (SDOH) Interventions     Readmission Risk Interventions No flowsheet data found.

## 2020-09-26 NOTE — Consult Note (Addendum)
WOC Nurse Consult Note: Reason for Consult: Consult requested for bilat heels and sacrum. Pt is very emaciated, incontinent, and immobile with multiple systemic factors which are risk factors for skin breakdown. Wound type: Sacrum with white intact scar tissue from previous locations which have healed.  There are currently no open areas. Right heel with intact skin Left heel with small area of darker-colored skin; deep tissue pressure injury .2X.2cm Dressing procedure/placement/frequency: Float heels to reduce pressure. Topical treatment orders provided for bedside nurses to perform as follows to protect from further injury: Foam dressing to bilat heels and sacrum, change Q 3 days or PRN soiling. Please re-consult if further assistance is needed.  Thank-you,  Cammie Mcgee MSN, RN, CWOCN, [place], CNS [phone]

## 2020-09-26 NOTE — Procedures (Signed)
Right DL brachial vein PICC placed. Length 33 cm. Tip SVC/RA junction. Medication used-1% lidocaine to skin/SQ tissue. 10 ml ioxheol for right upper extremity venography. Ok to use. EBL< 10 ml.

## 2020-09-27 LAB — AEROBIC/ANAEROBIC CULTURE W GRAM STAIN (SURGICAL/DEEP WOUND)

## 2020-10-17 ENCOUNTER — Ambulatory Visit: Payer: Self-pay

## 2020-10-17 ENCOUNTER — Ambulatory Visit (INDEPENDENT_AMBULATORY_CARE_PROVIDER_SITE_OTHER): Payer: Medicare (Managed Care) | Admitting: Specialist

## 2020-10-17 ENCOUNTER — Encounter: Payer: Self-pay | Admitting: Specialist

## 2020-10-17 ENCOUNTER — Other Ambulatory Visit: Payer: Self-pay

## 2020-10-17 VITALS — BP 193/85 | HR 101 | Ht 65.0 in | Wt 108.0 lb

## 2020-10-17 DIAGNOSIS — S72001A Fracture of unspecified part of neck of right femur, initial encounter for closed fracture: Secondary | ICD-10-CM

## 2020-10-17 NOTE — Patient Instructions (Signed)
Plan: Continue daily normal saline wet to dry dressings right distal lateral thigh. Obtain lab CBC with differential, Sed rate and CRP to determine with infection is controlled or if it is  Worsening and if we should consider amputation of the right leg at an above knee level to control infection and allow for healing.  Return appt in 2 weeks.

## 2020-10-17 NOTE — Progress Notes (Signed)
Post-Op Visit Note   Patient: Roberta Griffin           Date of Birth: Jun 05, 1926           MRN: [id number] Visit Date: 10/17/2020 PCP: Sherron Monday, MD   Assessment & Plan: RIght distal femur supracondylar fracture, open, receiving daily dressing changes.   Chief Complaint:  Chief Complaint  Patient presents with  . Right Leg - Routine Post Op  Patient is moving arms and is mumbling and screaming incoherently, baseline dementia.  Now nearly 3 weeks post op incision drainage and debridement of the right supracondylar femur fracture The fracture has formed callus and is in a position that has the proximal femur fracture overriding the distal right femur fracture fragment.   Dressing NS wet to dry is intact and redressed in the office today. The drainage from the right lateral distal femur is clean thin serous fluid. Repacked with iodofur dressing and 2x2 NS Visit Diagnoses:  1. Closed fracture of neck of right femur, initial encounter (HCC)   The right lateral distal femur open wound is granulating and there is no purulent drainage. Sutures proximal and distally are intact and these are removed today. Radiographs are  Performed and show callus both anteriorly and posteriorly at the fracture site.  Wound is about 1.5 inch in length but there is some extension into the suprapatella pouch.  Would benefit from laboratory to assess the CBC and Sed rate and CRP to determine if she is developing Greater infection.   Plan: Continue daily normal saline wet to dry dressings right distal lateral thigh. Obtain lab CBC with differential, Sed rate and CRP to determine with infection is controlled or if it is  Worsening and if we should consider amputation of the right leg at an above knee level to control infection and allow for healing.  Return appt in 2 weeks.   Follow-Up Instructions: No follow-ups on file.   Orders:  Orders Placed This Encounter  Procedures  . XR Knee 1-2 Views  Right   No orders of the defined types were placed in this encounter.   Imaging: No results found.  PMFS History: Patient Active Problem List   Diagnosis Date Noted  . Open displaced supracondylar fracture without intracondylar extension of lower end of right femur, type IIIA, IIIB, or IIIC, with malunion 09/21/2020    Priority: High    Class: Chronic  . Subacute osteomyelitis of right femur (HCC) 09/24/2020  . Femur fracture (HCC) 09/21/2020  . Open femur fracture, right (HCC) 09/21/2020  . Protein-calorie malnutrition, severe 03/31/2019  . History of pulmonary embolus (PE) 03/28/2019  . Hip fracture (HCC) 03/28/2019  . Fall 10/12/2018  . Hypothermia 10/11/2018  . Subcapital fracture of left hip (HCC) 07/29/2018  . Closed hip fracture, right, initial encounter (HCC) 07/26/2018  . Depression with anxiety 07/26/2017  . High risk medication use 06/03/2017  . Chronic pain of both shoulders 06/03/2017  . Schizophrenia (HCC) 04/26/2017  . GAD (generalized anxiety disorder) 04/05/2017  . History of stroke 12/05/2016  . Pulmonary embolus (HCC) 12/05/2016  . Chronic diastolic CHF (congestive heart failure) (HCC) 03/04/2016  . Insomnia 03/04/2016  . Dysarthria 03/04/2016  . Thyroid nodule 03/04/2016  . Pancreatic mass   . HLD (hyperlipidemia)   . Dysphagia 02/18/2016  . CVA (cerebral vascular accident) (HCC) 02/17/2016  . COPD (chronic obstructive pulmonary disease) (HCC) 02/17/2016  . Alzheimer's dementia without behavioral disturbance (HCC) 02/17/2016  . Essential hypertension 02/17/2016  .  Normocytic anemia 02/17/2016  . CKD (chronic kidney disease) stage 3, GFR 30-59 ml/min (HCC) 02/17/2016   Past Medical History:  Diagnosis Date  . COPD (chronic obstructive pulmonary disease) (HCC)   . Dementia (HCC)   . Depression   . Dysphagia   . Hypertension   . Scoliosis     Family History  Problem Relation Age of Onset  . Stroke Neg Hx     Past Surgical History:  Procedure  Laterality Date  . ABDOMINAL HYSTERECTOMY    . HIP ARTHROPLASTY Left 07/29/2018   Procedure: ARTHROPLASTY BIPOLAR HIP (HEMIARTHROPLASTY);  Surgeon: Valeria Batman, MD;  Location: [facility] OR;  Service: Orthopedics;  Laterality: Left;  . HIP ARTHROPLASTY Right 03/28/2019   Procedure: ARTHROPLASTY BIPOLAR HIP (HEMIARTHROPLASTY);  Surgeon: Nadara Mustard, MD;  Location: [facility] OR;  Service: Orthopedics;  Laterality: Right;  . INCISION AND DRAINAGE Right 09/21/2020   Procedure: INCISION AND DRAINAGE IRRIGATION AND DEBRIDEMENT OF OPEN RIGHT DISTAL SUPRACONDYLAR FEMUR FRACTURE;  Surgeon: Kerrin Champagne, MD;  Location: [facility] ORS;  Service: Orthopedics;  Laterality: Right;  . KNEE SURGERY    . SHOULDER SURGERY    . VITRECTOMY AND CATARACT     Social History   Occupational History  . Not on file  Tobacco Use  . Smoking status: Never Smoker  . Smokeless tobacco: Never Used  Substance and Sexual Activity  . Alcohol use: No  . Drug use: No  . Sexual activity: Not on file

## 2020-10-24 ENCOUNTER — Inpatient Hospital Stay: Payer: Medicare (Managed Care) | Admitting: Internal Medicine

## 2020-10-26 ENCOUNTER — Ambulatory Visit (INDEPENDENT_AMBULATORY_CARE_PROVIDER_SITE_OTHER): Payer: Medicare (Managed Care) | Admitting: Internal Medicine

## 2020-10-26 ENCOUNTER — Other Ambulatory Visit: Payer: Self-pay

## 2020-10-26 VITALS — BP 190/127 | HR 96

## 2020-10-26 DIAGNOSIS — M86251 Subacute osteomyelitis, right femur: Secondary | ICD-10-CM

## 2020-10-26 NOTE — Progress Notes (Signed)
RFV: follow up for proteus septic arthritis of right knee  Patient ID: Roberta Griffin, female   DOB: November 08, 1925, 87 y.o.   MRN: [id number]  HPI 87yo F with dementia, wheelchair bound. She was hospitalized on feb 9th at [facility] for open right distal supracondylar femur fracture and underwent s/p I x D. cx grew proteus. She has been on ceftriaxone 2gm IV daily through her SNF. She has seen dr Louanne Skye last week. She still has open incision of right knee. serousanginous drainage is noted on her dressing no surrounding erythema.  She is unable to give much report of her symptoms due to her dementia, she is tearful, which appears to be her baseline. Outpatient Encounter Medications as of 10/26/2020  Medication Sig  . acetaminophen (TYLENOL) 500 MG tablet Take 1 tablet (500 mg total) by mouth every 6 (six) hours as needed for mild pain. (Patient taking differently: Take 1,000 mg by mouth 3 (three) times daily.)  . aspirin 81 MG chewable tablet Chew 1 tablet (81 mg total) by mouth daily.  . bisacodyl (DULCOLAX) 5 MG EC tablet Take 1 tablet (5 mg total) by mouth daily as needed for moderate constipation.  . cefTRIAXone (ROCEPHIN) IVPB Inject 2 g into the vein daily. Indication: PJI/bursitis First Dose: No Last Day of Therapy:  11/03/2020 Labs - Once weekly:  CBC/D and BMP, Labs - Every other week:  ESR and CRP Method of administration: IV Push Method of administration may be changed at the discretion of home infusion pharmacist based upon assessment of the patient and/or caregiver's ability to self-administer the medication ordered.  . docusate sodium (COLACE) 100 MG capsule Take 1 capsule (100 mg total) by mouth 2 (two) times daily.  . Ensure (ENSURE) Take 237 mLs by mouth 2 (two) times daily between meals.  . Melatonin 3 MG TABS Take 3 mg by mouth at bedtime.   . metoprolol tartrate (LOPRESSOR) 25 MG tablet Take 0.5 tablets (12.5 mg total) by mouth 2 (two) times daily.  . Multiple Vitamin (MULTIVITAMIN  WITH MINERALS) TABS tablet Take 1 tablet by mouth daily.  Marland Kitchen omeprazole (PRILOSEC) 20 MG capsule Take 20 mg by mouth daily.  . polyethylene glycol (MIRALAX) packet Take 17 g by mouth daily as needed for moderate constipation. (Patient taking differently: Take 17 g by mouth See admin instructions. Daily and as needed for constipation)  . PROTEIN PO Take 30 mLs by mouth daily.  Marland Kitchen senna (SENOKOT) 8.6 MG TABS tablet Take 2 tablets by mouth at bedtime.  . sucralfate (CARAFATE) 1 g tablet Take 1 g by mouth 4 (four) times daily -  with meals and at bedtime.  . vitamin C (ASCORBIC ACID) 500 MG tablet Take 500 mg by mouth daily.  Marland Kitchen zinc sulfate 220 (50 Zn) MG capsule Take 220 mg by mouth daily.   No facility-administered encounter medications on file as of 10/26/2020.     Patient Active Problem List   Diagnosis Date Noted  . Subacute osteomyelitis of right femur ([place]) 09/24/2020  . Femur fracture ([place]) 09/21/2020  . Open displaced supracondylar fracture without intracondylar extension of lower end of right femur, type IIIA, IIIB, or IIIC, with malunion 09/21/2020    Class: Chronic  . Open femur fracture, right ([place]) 09/21/2020  . Protein-calorie malnutrition, severe 03/31/2019  . History of pulmonary embolus (PE) 03/28/2019  . Hip fracture ([place]) 03/28/2019  . Fall 10/12/2018  . Hypothermia 10/11/2018  . Subcapital fracture of left hip ([place]) 07/29/2018  . Closed hip fracture,  right, initial encounter ([place]) 07/26/2018  . Depression with anxiety 07/26/2017  . High risk medication use 06/03/2017  . Chronic pain of both shoulders 06/03/2017  . Schizophrenia ([place]) 04/26/2017  . GAD (generalized anxiety disorder) 04/05/2017  . History of stroke 12/05/2016  . Pulmonary embolus ([place]) 12/05/2016  . Chronic diastolic CHF (congestive heart failure) ([place]) 03/04/2016  . Insomnia 03/04/2016  . Dysarthria 03/04/2016  . Thyroid nodule 03/04/2016  . Pancreatic mass   . HLD (hyperlipidemia)   . Dysphagia  02/18/2016  . CVA (cerebral vascular accident) ([place]) 02/17/2016  . COPD (chronic obstructive pulmonary disease) ([place]) 02/17/2016  . Alzheimer's dementia without behavioral disturbance ([place]) 02/17/2016  . Essential hypertension 02/17/2016  . Normocytic anemia 02/17/2016  . CKD (chronic kidney disease) stage 3, GFR 30-59 ml/min (HCC) 02/17/2016     Health Maintenance Due  Topic Date Due  . COVID-19 Vaccine (1) Never done  . INFLUENZA VACCINE  03/13/2020    Sochx: no smoking or drinking Review of Systems Unable to obtain due to dementia Physical Exam   BP (!) 190/127   Pulse 96   SpO2 96%   gen = a xo by 1. Ext = right knee open wound 1.5 inch. Some granulation tissue in the wound bed. serosangious exudate on dressing picc line dressing non adherent on right upper extremity CBC Lab Results  Component Value Date   WBC 9.3 09/24/2020   RBC 4.19 09/24/2020   HGB 11.8 (L) 09/24/2020   HCT 36.9 09/24/2020   PLT 300 09/24/2020   MCV 88.1 09/24/2020   MCH 28.2 09/24/2020   MCHC 32.0 09/24/2020   RDW 14.2 09/24/2020   LYMPHSABS 1.1 09/24/2020   MONOABS 1.1 (H) 09/24/2020   EOSABS 0.0 09/24/2020    BMET Lab Results  Component Value Date   NA 134 (L) 09/24/2020   K 4.9 09/24/2020   CL 99 09/24/2020   CO2 24 09/24/2020   GLUCOSE 133 (H) 09/24/2020   BUN 24 (H) 09/24/2020   CREATININE 0.96 09/24/2020   CALCIUM 9.2 09/24/2020   GFRNONAA 56 (L) 09/24/2020   GFRAA 54 (L) 04/01/2019      Assessment and Plan  - iodophor packing strip into the wound / will give a bottle of iodoform packing strip for them to do at SNF - will check sed rate and crp, and cbc with diff - appears nutritional deficient which is difficult for wound healing - if inflammatory markers are lower, wonder if she would be a wound vac candidate but if there are signs of infection, then agree that amputation may be the better course of treatment since she has been on abtx for 32 days thus far. Still has  poor wound healing - see back in 2 wk

## 2020-10-27 LAB — SEDIMENTATION RATE: Sed Rate: 46 mm/h — ABNORMAL HIGH (ref 0–30)

## 2020-10-27 LAB — CBC WITH DIFFERENTIAL/PLATELET
Absolute Monocytes: 703 cells/uL (ref 200–950)
Basophils Absolute: 24 cells/uL (ref 0–200)
Basophils Relative: 0.3 %
Eosinophils Absolute: 237 cells/uL (ref 15–500)
Eosinophils Relative: 3 %
HCT: 33.1 % — ABNORMAL LOW (ref 35.0–45.0)
Hemoglobin: 10.6 g/dL — ABNORMAL LOW (ref 11.7–15.5)
Lymphs Abs: 1114 cells/uL (ref 850–3900)
MCH: 27.3 pg (ref 27.0–33.0)
MCHC: 32 g/dL (ref 32.0–36.0)
MCV: 85.3 fL (ref 80.0–100.0)
MPV: 10.1 fL (ref 7.5–12.5)
Monocytes Relative: 8.9 %
Neutro Abs: 5822 cells/uL (ref 1500–7800)
Neutrophils Relative %: 73.7 %
Platelets: 303 10*3/uL (ref 140–400)
RBC: 3.88 10*6/uL (ref 3.80–5.10)
RDW: 13.3 % (ref 11.0–15.0)
Total Lymphocyte: 14.1 %
WBC: 7.9 10*3/uL (ref 3.8–10.8)

## 2020-10-27 LAB — COMPREHENSIVE METABOLIC PANEL
AG Ratio: 1.1 (calc) (ref 1.0–2.5)
ALT: 9 U/L (ref 6–29)
AST: 17 U/L (ref 10–35)
Albumin: 3.3 g/dL — ABNORMAL LOW (ref 3.6–5.1)
Alkaline phosphatase (APISO): 87 U/L (ref 37–153)
BUN: 16 mg/dL (ref 7–25)
CO2: 28 mmol/L (ref 20–32)
Calcium: 9.4 mg/dL (ref 8.6–10.4)
Chloride: 103 mmol/L (ref 98–110)
Creat: 0.76 mg/dL (ref 0.60–0.88)
Globulin: 2.9 g/dL (calc) (ref 1.9–3.7)
Glucose, Bld: 116 mg/dL — ABNORMAL HIGH (ref 65–99)
Potassium: 4.2 mmol/L (ref 3.5–5.3)
Sodium: 138 mmol/L (ref 135–146)
Total Bilirubin: 0.2 mg/dL (ref 0.2–1.2)
Total Protein: 6.2 g/dL (ref 6.1–8.1)

## 2020-10-27 LAB — C-REACTIVE PROTEIN: CRP: 34.4 mg/L — ABNORMAL HIGH (ref <8.0)

## 2020-11-11 ENCOUNTER — Ambulatory Visit: Payer: Medicare (Managed Care) | Admitting: Specialist

## 2020-11-15 ENCOUNTER — Ambulatory Visit (INDEPENDENT_AMBULATORY_CARE_PROVIDER_SITE_OTHER): Payer: Medicare (Managed Care) | Admitting: Internal Medicine

## 2020-11-15 ENCOUNTER — Other Ambulatory Visit: Payer: Self-pay

## 2020-11-15 DIAGNOSIS — A498 Other bacterial infections of unspecified site: Secondary | ICD-10-CM | POA: Diagnosis not present

## 2020-11-15 DIAGNOSIS — E43 Unspecified severe protein-calorie malnutrition: Secondary | ICD-10-CM | POA: Diagnosis not present

## 2020-11-15 DIAGNOSIS — M86251 Subacute osteomyelitis, right femur: Secondary | ICD-10-CM

## 2020-11-15 NOTE — Progress Notes (Signed)
JGG:[id number] up on right patellar/joint infection      Patient ID: Roberta Griffin, female   DOB: Nov 28, 1925, 87 y.o.   MRN: [id number]  HPI Roberta Griffin is a 87yo F who is a SNF resident who was treated for chronic septic arthritis of right knee with I x d of open right distal supracondylar femur fracture with draining sinus on 2/9. She has  finished iv ceftriaxone on 3/24 fr proteus + cx. Continues to get daily wound packing to her right leg/knee. On exam some mucopurulent/serosanginous drainage on bandage. She is followed by dr Otelia Sergeant. There was previous discussion that if this worsens that she may need aka.   Outpatient Encounter Medications as of 11/15/2020  Medication Sig  . acetaminophen (TYLENOL) 500 MG tablet Take 1 tablet (500 mg total) by mouth every 6 (six) hours as needed for mild pain. (Patient taking differently: Take 1,000 mg by mouth 3 (three) times daily.)  . aspirin 81 MG chewable tablet Chew 1 tablet (81 mg total) by mouth daily.  . bisacodyl (DULCOLAX) 5 MG EC tablet Take 1 tablet (5 mg total) by mouth daily as needed for moderate constipation.  . docusate sodium (COLACE) 100 MG capsule Take 1 capsule (100 mg total) by mouth 2 (two) times daily.  . Ensure (ENSURE) Take 237 mLs by mouth 2 (two) times daily between meals.  . Melatonin 3 MG TABS Take 3 mg by mouth at bedtime.   . metoprolol tartrate (LOPRESSOR) 25 MG tablet Take 0.5 tablets (12.5 mg total) by mouth 2 (two) times daily.  . Multiple Vitamin (MULTIVITAMIN WITH MINERALS) TABS tablet Take 1 tablet by mouth daily.  Marland Kitchen omeprazole (PRILOSEC) 20 MG capsule Take 20 mg by mouth daily.  . polyethylene glycol (MIRALAX) packet Take 17 g by mouth daily as needed for moderate constipation. (Patient taking differently: Take 17 g by mouth See admin instructions. Daily and as needed for constipation)  . PROTEIN PO Take 30 mLs by mouth daily.  Marland Kitchen senna (SENOKOT) 8.6 MG TABS tablet Take 2 tablets by mouth at bedtime.  . sucralfate  (CARAFATE) 1 g tablet Take 1 g by mouth 4 (four) times daily -  with meals and at bedtime.  . vitamin C (ASCORBIC ACID) 500 MG tablet Take 500 mg by mouth daily.  Marland Kitchen zinc sulfate 220 (50 Zn) MG capsule Take 220 mg by mouth daily.   No facility-administered encounter medications on file as of 11/15/2020.     Patient Active Problem List   Diagnosis Date Noted  . Subacute osteomyelitis of right femur (HCC) 09/24/2020  . Femur fracture (HCC) 09/21/2020  . Open displaced supracondylar fracture without intracondylar extension of lower end of right femur, type IIIA, IIIB, or IIIC, with malunion 09/21/2020    Class: Chronic  . Open femur fracture, right (HCC) 09/21/2020  . Protein-calorie malnutrition, severe 03/31/2019  . History of pulmonary embolus (PE) 03/28/2019  . Hip fracture (HCC) 03/28/2019  . Fall 10/12/2018  . Hypothermia 10/11/2018  . Subcapital fracture of left hip (HCC) 07/29/2018  . Closed hip fracture, right, initial encounter (HCC) 07/26/2018  . Depression with anxiety 07/26/2017  . High risk medication use 06/03/2017  . Chronic pain of both shoulders 06/03/2017  . Schizophrenia (HCC) 04/26/2017  . GAD (generalized anxiety disorder) 04/05/2017  . History of stroke 12/05/2016  . Pulmonary embolus (HCC) 12/05/2016  . Chronic diastolic CHF (congestive heart failure) (HCC) 03/04/2016  . Insomnia 03/04/2016  . Dysarthria 03/04/2016  . Thyroid nodule 03/04/2016  .  Pancreatic mass   . HLD (hyperlipidemia)   . Dysphagia 02/18/2016  . CVA (cerebral vascular accident) (HCC) 02/17/2016  . COPD (chronic obstructive pulmonary disease) (HCC) 02/17/2016  . Alzheimer's dementia without behavioral disturbance (HCC) 02/17/2016  . Essential hypertension 02/17/2016  . Normocytic anemia 02/17/2016  . CKD (chronic kidney disease) stage 3, GFR 30-59 ml/min (HCC) 02/17/2016     Health Maintenance Due  Topic Date Due  . COVID-19 Vaccine (1) Never done     Review of Systems Limited ROS  due to dementia. No diarrhea, no fever, nor N/V Physical Exam  There were no vitals taken for this visit. gen = a x o by 1. Emaciated, intermittently tearful (her baseline) heent = no signs of thrush. MMM pulm = CTAB no w/c/r Cors= nl s1,s2 no g/m/r Ext= serosanginous some purulence on bandage of right knee. No surrounding erythema Lab Results  Component Value Date   LABRPR Non Reactive 02/18/2016    CBC Lab Results  Component Value Date   WBC 7.9 10/26/2020   RBC 3.88 10/26/2020   HGB 10.6 (L) 10/26/2020   HCT 33.1 (L) 10/26/2020   PLT 303 10/26/2020   MCV 85.3 10/26/2020   MCH 27.3 10/26/2020   MCHC 32.0 10/26/2020   RDW 13.3 10/26/2020   LYMPHSABS 1,114 10/26/2020   MONOABS 1.1 (H) 09/24/2020   EOSABS 237 10/26/2020    BMET Lab Results  Component Value Date   NA 138 10/26/2020   K 4.2 10/26/2020   CL 103 10/26/2020   CO2 28 10/26/2020   GLUCOSE 116 (H) 10/26/2020   BUN 16 10/26/2020   CREATININE 0.76 10/26/2020   CALCIUM 9.4 10/26/2020   GFRNONAA 56 (L) 09/24/2020   GFRAA 54 (L) 04/01/2019      Assessment and Plan Subacute femur osteo =   wound still draining and slow to heal. Unclear if nutritional status will improve.  Continue with wound care Recommend 2 wk of amox/clav to finish out course  Please follow up with dr Otelia Sergeant since still ongoing sinus tract is present  Severe protein calorie malnutrition = continue with dietary supplementation

## 2021-05-13 DEATH — deceased
